# Patient Record
Sex: Female | Born: 1950
Health system: Southern US, Community
[De-identification: ages and names within clinical notes are randomized; demographics above are authoritative.]

## PROBLEM LIST (undated history)

## (undated) DIAGNOSIS — E079 Disorder of thyroid, unspecified: Secondary | ICD-10-CM

## (undated) DIAGNOSIS — G4730 Sleep apnea, unspecified: Secondary | ICD-10-CM

## (undated) DIAGNOSIS — H9190 Unspecified hearing loss, unspecified ear: Secondary | ICD-10-CM

## (undated) DIAGNOSIS — K635 Polyp of colon: Secondary | ICD-10-CM

## (undated) DIAGNOSIS — T7840XA Allergy, unspecified, initial encounter: Secondary | ICD-10-CM

## (undated) DIAGNOSIS — J449 Chronic obstructive pulmonary disease, unspecified: Secondary | ICD-10-CM

## (undated) DIAGNOSIS — J189 Pneumonia, unspecified organism: Secondary | ICD-10-CM

## (undated) DIAGNOSIS — R0602 Shortness of breath: Secondary | ICD-10-CM

## (undated) DIAGNOSIS — G709 Myoneural disorder, unspecified: Secondary | ICD-10-CM

## (undated) DIAGNOSIS — F329 Major depressive disorder, single episode, unspecified: Secondary | ICD-10-CM

## (undated) DIAGNOSIS — A630 Anogenital (venereal) warts: Secondary | ICD-10-CM

## (undated) DIAGNOSIS — M1990 Unspecified osteoarthritis, unspecified site: Secondary | ICD-10-CM

## (undated) DIAGNOSIS — M810 Age-related osteoporosis without current pathological fracture: Secondary | ICD-10-CM

## (undated) DIAGNOSIS — F419 Anxiety disorder, unspecified: Secondary | ICD-10-CM

## (undated) HISTORY — PX: COLONOSCOPY: SHX174

## (undated) HISTORY — DX: Anxiety disorder, unspecified: F41.9

## (undated) HISTORY — DX: Allergy, unspecified, initial encounter: T78.40XA

## (undated) HISTORY — DX: Age-related osteoporosis without current pathological fracture: M81.0

## (undated) HISTORY — PX: KNEE ARTHROSCOPY: SUR90

## (undated) HISTORY — PX: SPINE SURGERY: SHX786

## (undated) HISTORY — DX: Myoneural disorder, unspecified: G70.9

## (undated) HISTORY — DX: Anogenital (venereal) warts: A63.0

## (undated) HISTORY — DX: Unspecified hearing loss, unspecified ear: H91.90

## (undated) HISTORY — DX: Sleep apnea, unspecified: G47.30

## (undated) HISTORY — DX: Chronic obstructive pulmonary disease, unspecified: J44.9

## (undated) HISTORY — PX: EYE SURGERY: SHX253

## (undated) HISTORY — DX: Disorder of thyroid, unspecified: E07.9

## (undated) HISTORY — DX: Polyp of colon: K63.5

## (undated) HISTORY — PX: TONSILLECTOMY: SUR1361

## (undated) HISTORY — PX: TUBAL LIGATION: SHX77

## (undated) HISTORY — DX: Unspecified osteoarthritis, unspecified site: M19.90

## (undated) HISTORY — DX: Major depressive disorder, single episode, unspecified: F32.9

## (undated) SURGERY — Surgical Case
Anesthesia: *Unknown

---

## 2002-04-12 ENCOUNTER — Encounter: Payer: Self-pay | Admitting: Unknown Physician Specialty

## 2002-04-12 ENCOUNTER — Ambulatory Visit (HOSPITAL_COMMUNITY): Admission: RE | Admit: 2002-04-12 | Discharge: 2002-04-12 | Payer: Self-pay | Admitting: Unknown Physician Specialty

## 2010-05-25 ENCOUNTER — Ambulatory Visit (INDEPENDENT_AMBULATORY_CARE_PROVIDER_SITE_OTHER): Payer: 59 | Admitting: Family Medicine

## 2010-05-25 ENCOUNTER — Encounter: Payer: Self-pay | Admitting: Family Medicine

## 2010-05-25 DIAGNOSIS — Z0000 Encounter for general adult medical examination without abnormal findings: Secondary | ICD-10-CM

## 2010-05-25 DIAGNOSIS — Z780 Asymptomatic menopausal state: Secondary | ICD-10-CM

## 2010-05-25 DIAGNOSIS — M1710 Unilateral primary osteoarthritis, unspecified knee: Secondary | ICD-10-CM

## 2010-05-25 DIAGNOSIS — Z1231 Encounter for screening mammogram for malignant neoplasm of breast: Secondary | ICD-10-CM

## 2010-05-25 DIAGNOSIS — IMO0002 Reserved for concepts with insufficient information to code with codable children: Secondary | ICD-10-CM

## 2010-05-25 MED ORDER — MELOXICAM 15 MG PO TABS: 15.0000 mg | ORAL_TABLET | Freq: Every day | ORAL | Status: DC

## 2010-05-25 NOTE — Patient Instructions (Signed)
Arthritis - Degenerative, Osteoarthritis You have osteoarthritis. This is the wear and tear arthritis that comes with aging. It is also called degenerative arthritis. This is common in people past middle age. It is caused by stress on the joints from living. The large weight bearing joints of the lower extremities are most often affected. The knees, hips, back, neck, and hands can become painful, swollen, and stiff. This is the most common type of arthritis. It comes on with age, carrying too much weight, and from injury. Treatment includes resting the sore joint until the pain and swelling improve. Crutches or a walker may be needed for severe flares. Only take over-the-counter or prescription medicines for pain, discomfort, or fever as directed by your caregiver. Local heat therapy may improve motion. Cortisone shots into the joint are sometimes used to reduce pain and swelling during flares. Osteoarthritis is usually not crippling and progresses slowly. There are things you can do to decrease pain:  Avoid high impact activities.   Exercise regularly.   Low impact exercises such as walking, biking and swimming help to keep the muscles strong and keep normal joint function.   Stretching helps to keep your range of motion.   Lose weight if you are overweight. This reduces joint stress.  In severe cases when you have pain at rest or increasing disability, joint surgery may be helpful. See your caregiver for follow-up treatment as recommended.  SEEK IMMEDIATE MEDICAL CARE IF:  You have severe joint pain.   Marked swelling and redness in your joint develops.   You develop a high fever.  Document Released: 12/24/2004 Document Re-Released: 04/25/2007 [employer] Patient Information 2011 [employer]. 

## 2010-05-25 NOTE — Progress Notes (Signed)
  SUBJECTIVE:   Jasmine Mclaughlin is a 60 y.o. female who presents in consultation for osteoporosis and OA.  She was dx at 38 with arthritis in back and has progressively getting worse.   She is falling a lot and does not know if her leg is giving out or not--- its always the right leg.  It's been going on for 1 1/2 years.  She always falls forward and always on R knee.  She avoids steps because of knee pain.   OSTEOPOROSIS risk factors (non-modifiable) Personal Hx of fracture as an adult: no Hx of fracture in first-degree relative: no Caucasian race: yes Advanced age: no female sex: yes Dementia: no Poor health/frailty:no   OSTEOPOROSIS risk factors (potentially modifiable): Tobacco use: yes Low body weight (<127 lbs): no Estrogen deficiency    early menopause (age <45) or bilateral ovariectomy: no    prolonged premenopausal amenorrhea (>1 yr): no Low calcium intake (lifelong): no Alcoholism: no Recurrent falls: yes Inadequate physical activity: yes  Current calcium and Vit D intake: Dietary sources: milk supplements: ca and vita d     OBJECTIVE:    BP 132/76  Pulse 71  Temp(Src) 99 F (37.2 C) (Oral)  Ht 5\' 4"  (1.626 m)  Wt 204 lb 9.6 oz (92.806 kg)  BMI 35.12 kg/m2  SpO2 99% General appearance: alert, cooperative, appears stated age and no distress Back: symmetric, no curvature. ROM normal. No CVA tenderness. Lungs: clear to auscultation bilaterally Heart: regular rate and rhythm, S1, S2 normal, no murmur, click, rub or gallop Extremities: + swelling b/l knees  R>L,  no pain with palp,   Pulses: 2+ and symmetric Skin: Skin color, texture, turgor normal. No rashes or lesions Lymph nodes: Cervical, supraclavicular, and axillary nodes normal. Neurologic: Alert and oriented X 3, normal strength and tone. Normal symmetric reflexes. Normal coordination and gait  Results of prior DEXA:None ordered      ASSESSMENT:   osteoarthritis   Frequent falls   PLAN:  Refer to ortho  for OA  check BMD (Note: The National Osteoporosis Foundation recommends pharmacological treatment  of all postmenopausal women with T-scores below -2.0, and those with T-scores below -1.5  with risk factors for osteoporosis. Osteoporosis is present when the T-score is below -2.5. The T-score is defined as the number of standard deviations above or below the average BMD value for young, healthy white women.)  Labs:  See orders.  Is pharmacological therapy indicated? will wait for results Which medication is selected: none Which dosing regimen is selected: none  A. Contraindications to bisphosphonates: hypocalcemia, esophageal abnormalities, inability to remain upright for at least 30 min. post dose, renal insufficiency. Contraindications reviewed: not applicable.  B. Patients on bisphosphonates should also receive adequate calcium and vitamin D supplementation. Patient advised regarding calcium and vitamin D supplements: yes. Patient advised not to take these supplements at same time as bisphosphonate due to inhibition of absorption: not applicable.  C. Contraindications to SERM therapy: history of blood clots, may increase hot flashes and cause leg cramping.

## 2010-06-15 ENCOUNTER — Other Ambulatory Visit: Payer: 59

## 2010-06-15 ENCOUNTER — Ambulatory Visit: Payer: 59

## 2010-07-20 ENCOUNTER — Ambulatory Visit
Admission: RE | Admit: 2010-07-20 | Discharge: 2010-07-20 | Disposition: A | Discharge status: Home / Self Care | Payer: 59 | Source: Ambulatory Visit | Attending: Family Medicine | Admitting: Family Medicine

## 2010-07-20 DIAGNOSIS — Z1231 Encounter for screening mammogram for malignant neoplasm of breast: Secondary | ICD-10-CM

## 2010-07-20 DIAGNOSIS — Z780 Asymptomatic menopausal state: Secondary | ICD-10-CM

## 2010-07-25 ENCOUNTER — Telehealth: Payer: Self-pay

## 2010-07-25 NOTE — Telephone Encounter (Signed)
Message copied by Arnette Norris on Wed Jul 25, 2010  1:50 PM ------      Message from: Lelon Perla      Created: Sun Jul 22, 2010  8:21 PM       If pt is taking 1200-1500 mg calcium daily and 1000u vita D---- consider add fosamx 70 mg weekly for osteopenia      Recheck 2 years

## 2010-07-25 NOTE — Telephone Encounter (Signed)
Discussed with patient and she stated she was only taking 500 mg of calcium daily. She has agreed to increase and take 1000 mg of Vitamin D. Will call and discuss if she decides to start medications--- Copy of report mailed to patient     KP

## 2010-07-30 ENCOUNTER — Other Ambulatory Visit: Payer: Self-pay | Admitting: Family Medicine

## 2010-07-30 DIAGNOSIS — R928 Other abnormal and inconclusive findings on diagnostic imaging of breast: Secondary | ICD-10-CM

## 2010-08-01 ENCOUNTER — Encounter: Payer: Self-pay | Admitting: Family Medicine

## 2010-08-03 ENCOUNTER — Ambulatory Visit
Admission: RE | Admit: 2010-08-03 | Discharge: 2010-08-03 | Disposition: A | Discharge status: Home / Self Care | Payer: 59 | Source: Ambulatory Visit | Attending: Family Medicine | Admitting: Family Medicine

## 2010-08-03 DIAGNOSIS — R928 Other abnormal and inconclusive findings on diagnostic imaging of breast: Secondary | ICD-10-CM

## 2010-08-29 ENCOUNTER — Ambulatory Visit (INDEPENDENT_AMBULATORY_CARE_PROVIDER_SITE_OTHER): Payer: 59 | Admitting: Family Medicine

## 2010-08-29 ENCOUNTER — Other Ambulatory Visit (HOSPITAL_COMMUNITY)
Admission: RE | Admit: 2010-08-29 | Discharge: 2010-08-29 | Disposition: A | Discharge status: Home / Self Care | Payer: 59 | Source: Ambulatory Visit | Attending: Family Medicine | Admitting: Family Medicine

## 2010-08-29 ENCOUNTER — Encounter: Payer: Self-pay | Admitting: Internal Medicine

## 2010-08-29 ENCOUNTER — Encounter: Payer: Self-pay | Admitting: Family Medicine

## 2010-08-29 VITALS — BP 132/74 | HR 71 | Temp 98.9°F | Ht 63.25 in | Wt 206.4 lb

## 2010-08-29 DIAGNOSIS — Z23 Encounter for immunization: Secondary | ICD-10-CM

## 2010-08-29 DIAGNOSIS — Z01419 Encounter for gynecological examination (general) (routine) without abnormal findings: Secondary | ICD-10-CM | POA: Insufficient documentation

## 2010-08-29 DIAGNOSIS — K635 Polyp of colon: Secondary | ICD-10-CM

## 2010-08-29 DIAGNOSIS — M1710 Unilateral primary osteoarthritis, unspecified knee: Secondary | ICD-10-CM

## 2010-08-29 DIAGNOSIS — D126 Benign neoplasm of colon, unspecified: Secondary | ICD-10-CM

## 2010-08-29 DIAGNOSIS — M179 Osteoarthritis of knee, unspecified: Secondary | ICD-10-CM

## 2010-08-29 DIAGNOSIS — K625 Hemorrhage of anus and rectum: Secondary | ICD-10-CM

## 2010-08-29 DIAGNOSIS — IMO0002 Reserved for concepts with insufficient information to code with codable children: Secondary | ICD-10-CM

## 2010-08-29 DIAGNOSIS — Z0000 Encounter for general adult medical examination without abnormal findings: Secondary | ICD-10-CM

## 2010-08-29 LAB — LIPID PANEL
HDL: 47.5 mg/dL (ref >39.00)
LDL Cholesterol: 94 mg/dL (ref 0–99)
Total CHOL/HDL Ratio: 4
Triglycerides: 126 mg/dL (ref 0.0–149.0)
VLDL: 25.2 mg/dL (ref 0.0–40.0)

## 2010-08-29 LAB — POCT URINALYSIS DIPSTICK
Bilirubin, UA: NEGATIVE
Blood, UA: NEGATIVE
Leukocytes, UA: NEGATIVE
Nitrite, UA: NEGATIVE
Protein, UA: NEGATIVE
Urobilinogen, UA: 0.2
pH, UA: 5

## 2010-08-29 LAB — CBC WITH DIFFERENTIAL/PLATELET
Basophils Absolute: 0.1 10*3/uL (ref 0.0–0.1)
Basophils Relative: 0.5 % (ref 0.0–3.0)
Eosinophils Absolute: 0.2 10*3/uL (ref 0.0–0.7)
Lymphocytes Relative: 31 % (ref 12.0–46.0)
MCHC: 33.1 g/dL (ref 30.0–36.0)
MCV: 95.5 fl (ref 78.0–100.0)
Monocytes Absolute: 0.7 10*3/uL (ref 0.1–1.0)
Neutrophils Relative %: 59.6 % (ref 43.0–77.0)
Platelets: 323 10*3/uL (ref 150.0–400.0)
RBC: 4.34 Mil/uL (ref 3.87–5.11)

## 2010-08-29 LAB — HEPATIC FUNCTION PANEL
Alkaline Phosphatase: 84 U/L (ref 39–117)
Bilirubin, Direct: 0.1 mg/dL (ref 0.0–0.3)
Total Bilirubin: 0.4 mg/dL (ref 0.3–1.2)

## 2010-08-29 LAB — BASIC METABOLIC PANEL
BUN: 21 mg/dL (ref 6–23)
CO2: 26 mEq/L (ref 19–32)
Calcium: 10.2 mg/dL (ref 8.4–10.5)
Chloride: 107 mEq/L (ref 96–112)
Creatinine, Ser: 0.7 mg/dL (ref 0.4–1.2)

## 2010-08-29 MED ORDER — NAPROXEN 500 MG PO TABS: 500.0000 mg | ORAL_TABLET | Freq: Two times a day (BID) | ORAL | Status: DC

## 2010-08-29 NOTE — Patient Instructions (Signed)

## 2010-08-29 NOTE — Progress Notes (Signed)
Addended by: Arnette Norris on: 08/29/2010 10:31 AM   Modules accepted: Orders

## 2010-08-29 NOTE — Progress Notes (Signed)
Subjective:     Jasmine Mclaughlin is a 60 y.o. female and is here for a comprehensive physical exam. The patient reports problems - + blood per rectum.  + hx colon polyps and constipation.  History   Social History  . Marital Status: Married    Spouse Name: N/A    Number of Children: N/A  . Years of Education: N/A   Occupational History  . [employer]     sits in front of computer   Social History Main Topics  . Smoking status: Current Everyday Smoker -- 0.5 packs/day for 45 years    Types: Cigarettes  . Smokeless tobacco: Never Used   Comment: she is thinking about it   . Alcohol Use: Yes  . Drug Use: No  . Sexually Active: Yes -- Female partner(s)   Other Topics Concern  . Not on file   Social History Narrative  . No narrative on file   Health Maintenance  Topic Date Due  . Pap Smear  06/04/1969  . Tetanus/tdap  06/05/1970  . Influenza Vaccine  10/08/2010  . Mammogram  08/02/2012  . Colonoscopy  08/29/2015    The following portions of the patient's history were reviewed and updated as appropriate: allergies, current medications, past family history, past medical history, past social history, past surgical history and problem list.  Review of Systems Review of Systems  Constitutional: Negative for activity change, appetite change and fatigue.  HENT: Negative for hearing loss, congestion, tinnitus and ear discharge.  + dentures Eyes: Negative for visual disturbance (see optho q1y -- vision corrected to 20/20 with glasses).  Respiratory: Negative for cough, chest tightness and shortness of breath.   Cardiovascular: Negative for chest pain, palpitations and leg swelling.  Gastrointestinal: Negative for abdominal pain, diarrhea, constipation and abdominal distention.  Genitourinary: Negative for urgency, frequency, decreased urine volume and difficulty urinating.  Musculoskeletal: Negative for back pain, arthralgias and gait problem.  Skin: Negative for color change, pallor and rash.    Neurological: Negative for dizziness, light-headedness, numbness and headaches.  Hematological: Negative for adenopathy. Does not bruise/bleed easily.  Psychiatric/Behavioral: Negative for suicidal ideas, confusion, sleep disturbance, self-injury, dysphoric mood, decreased concentration and agitation.       Objective:    BP 132/74  Pulse 71  Temp(Src) 98.9 F (37.2 C) (Oral)  Ht 5' 3.25" (1.607 m)  Wt 206 lb 6.4 oz (93.622 kg)  BMI 36.27 kg/m2  SpO2 97% General appearance: alert, cooperative, appears stated age and no distress Head: Normocephalic, without obvious abnormality, atraumatic Eyes: conjunctivae/corneas clear. PERRL, EOM's intact. Fundi benign. Ears: normal TM's and external ear canals both ears Nose: Nares normal. Septum midline. Mucosa normal. No drainage or sinus tenderness. Throat: lips, mucosa, and tongue normal; teeth and gums normal Neck: no adenopathy, no carotid bruit, no JVD, supple, symmetrical, trachea midline and thyroid not enlarged, symmetric, no tenderness/mass/nodules Back: symmetric, no curvature. ROM normal. No CVA tenderness. Lungs: clear to auscultation bilaterally Breasts: normal appearance, no masses or tenderness Heart: regular rate and rhythm, S1, S2 normal, no murmur, click, rub or gallop Abdomen: soft, non-tender; bowel sounds normal; no masses,  no organomegaly Pelvic: cervix normal in appearance, external genitalia normal, no adnexal masses or tenderness, no cervical motion tenderness, rectovaginal septum normal, uterus normal size, shape, and consistency and vagina normal without discharge  Heme neg brown stool Extremities: extremities normal, atraumatic, no cyanosis or edema Pulses: 2+ and symmetric Skin: Skin color, texture, turgor normal. No rashes or lesions Lymph nodes: Cervical,  supraclavicular, and axillary nodes normal. Neurologic: Alert and oriented X 3, normal strength and tone. Normal symmetric reflexes. Normal coordination and  gait psych--no depression / anxiety    Assessment:  1  Healthy female exam. -- 2 rectal bleed 3  Hx polyps 4 OA-- per ortho   Plan:  Refer to GI Check fasting labs dtap done today  ghm utd See After Visit Summary for Counseling Recommendations

## 2010-09-19 ENCOUNTER — Ambulatory Visit: Payer: 59 | Admitting: Internal Medicine

## 2010-09-28 ENCOUNTER — Encounter: Payer: Self-pay | Admitting: Internal Medicine

## 2010-09-28 ENCOUNTER — Ambulatory Visit (INDEPENDENT_AMBULATORY_CARE_PROVIDER_SITE_OTHER): Payer: 59 | Admitting: Internal Medicine

## 2010-09-28 DIAGNOSIS — Z720 Tobacco use: Secondary | ICD-10-CM | POA: Insufficient documentation

## 2010-09-28 DIAGNOSIS — M1990 Unspecified osteoarthritis, unspecified site: Secondary | ICD-10-CM | POA: Insufficient documentation

## 2010-09-28 DIAGNOSIS — F17200 Nicotine dependence, unspecified, uncomplicated: Secondary | ICD-10-CM

## 2010-09-28 DIAGNOSIS — Z86010 Personal history of colonic polyps: Secondary | ICD-10-CM

## 2010-09-28 DIAGNOSIS — K625 Hemorrhage of anus and rectum: Secondary | ICD-10-CM

## 2010-09-28 HISTORY — DX: Tobacco use: Z72.0

## 2010-09-28 HISTORY — DX: Unspecified osteoarthritis, unspecified site: M19.90

## 2010-09-28 MED ORDER — PEG-KCL-NACL-NASULF-NA ASC-C 100 G PO SOLR: 1.0000 | ORAL | Status: DC

## 2010-09-28 NOTE — Patient Instructions (Addendum)
You have been given a separate informational sheet regarding your tobacco use, the importance of quitting and local resources to help you quit. You have been scheduled for a Colonoscopy, instructions have been provided. You Prep solution has been sent to your pharmacy. You may use Colace as needed.

## 2010-09-28 NOTE — Progress Notes (Signed)
Subjective:    Patient ID: Jasmine Mclaughlin, female    DOB: 13-Jul-1950, 60 y.o.   MRN: [id number]  HPI Jasmine Mclaughlin is a 59 year old female with a past medical history of colonic polyps and arthritis who seen in consultation at the request of Dr. Laury Axon for evaluation of rectal bleeding history of colon polyps.  The patient reports she is intermittently seeing bright red blood with bowel movements.  She reports this is the same symptoms that she was having back in 2006 prior to her polypectomy. Her bleeding now is erratic in may happen one to 2 days and then disappear for a week or 2. She reports it is always bright red and present on the stool and on the tissue. She normally sees this with her a.m. bowel movement and rarely with other bowel movements in a given day. She's had no abdominal pain. She occasionally has constipation which she relates to her narcotic pain medication. She is using Colace when necessary. She denies diarrhea. She denies change in her stool caliber. She reports her appetite has been good and her weight is stable. No nausea or vomiting. No heartburn. No trouble swallowing or painful swallowing. No fevers chills or night sweats.  The patient's fascia at her first colonoscopy in 2006 in which she had several polyps removed, one of which was large and felt to be the source of her hematochezia. She reports this colon polyp was removed and she had a subsequent colonoscopy 6 months later to ensure complete resection. She was told to have a colonoscopy in 2 years. She reports this did not happen due to difficulty with her insurance and being laid off from her job.  Review of Systems Constitutional: Negative for fever, chills, night sweats, activity change, appetite change and unexpected weight change HEENT: Negative for sore throat, mouth sores and trouble swallowing. Eyes: Negative for visual disturbance Respiratory: Negative for cough, chest tightness and shortness of breath Cardiovascular:  Negative for chest pain, palpitations and lower extremity swelling Gastrointestinal: See history of present illness Genitourinary: Negative for dysuria and hematuria. Musculoskeletal: Negative for back pain, + chronic arthritis, no myalgias Skin: Negative for rash or color change Neurological: Negative for headaches, weakness, numbness Hematological: Negative for adenopathy, negative for easy bruising/bleeding Psychiatric/behavioral: Negative for depressed mood, negative for anxiety  Past Medical History  Diagnosis Date  . Arthritis   . Colon polyps - 2cm sigmoid TVA 2006   . Genital warts   . Asthma    Current Outpatient Prescriptions  Medication Sig Dispense Refill  . Calcium Carbonate ([facility] CALCIUM) 1500 MG TABS Take 1 tablet by mouth daily.        . cholecalciferol (VITAMIN D) 1000 UNITS tablet Take 1,000 Units by mouth daily.        . Doxylamine Succinate, Sleep, (SLEEP AID PO) Take 1 tablet by mouth daily.        Marland Kitchen HYDROcodone-acetaminophen (NORCO) 5-325 MG per tablet Take 1 tablet by mouth daily.       . Misc Natural Products (OSTEO BI-FLEX ADV JOINT SHIELD PO) Take 2 tablets by mouth daily.        . naproxen (NAPROSYN) 500 MG tablet Take 1 tablet (500 mg total) by mouth 2 (two) times daily.  60 tablet  2  . peg 3350 powder (MOVIPREP) 100 G SOLR Take 1 kit (100 g total) by mouth as directed. See written handout  1 kit  0   No Known Allergies  Family History  Problem Relation  Age of Onset  . Arthritis Mother   . Arthritis Father   . Stroke Father   . Colon cancer Father 14  . Breast cancer Maternal Grandmother     Social History  . Marital Status: Single    Number of Children: 1   Occupational History  . [employer]     sits in front of computer   Social History Main Topics  . Smoking status: Current Everyday Smoker -- 0.5 packs/day for 45 years    Types: Cigarettes  . Smokeless tobacco: Never Used   Comment: she is thinking about it   . Alcohol Use: No  . Drug Use:  No  . Sexually Active: Yes -- Female partner(s)   Social History Narrative   1 caffeine drinks daily       Objective:   Physical Exam BP 128/74  Pulse 60  Ht 5\' 4"  (1.626 m)  Wt 207 lb (93.895 kg)  BMI 35.53 kg/m2 Constitutional: Well-developed and well-nourished. No distress. HEENT: Normocephalic and atraumatic. Oropharynx is clear and moist. No oropharyngeal exudate. Conjunctivae are normal. Pupils are equal round and reactive to light. No scleral icterus. Neck: Neck supple. Trachea midline. Cardiovascular: Normal rate, regular rhythm and intact distal pulses. No M/R/G Pulmonary/chest: Effort normal and breath sounds normal. Mild end expiratory wheeze, no rales or rhonchi. Abdominal: Soft, obese, nontender, nondistended. Bowel sounds active throughout. There are no masses palpable. No hepatosplenomegaly. Lymphadenopathy: No cervical adenopathy noted. Neurological: Alert and oriented to person place and time. Skin: Skin is warm and dry. No rashes noted. Psychiatric: Normal mood and affect. Behavior is normal.  CBC    Component Value Date/Time   WBC 10.2 08/29/2010 1027   RBC 4.34 08/29/2010 1027   HGB 13.8 08/29/2010 1027   HCT 41.5 08/29/2010 1027   PLT 323.0 08/29/2010 1027   MCV 95.5 08/29/2010 1027   MCHC 33.1 08/29/2010 1027   RDW 14.6 08/29/2010 1027   LYMPHSABS 3.2 08/29/2010 1027   MONOABS 0.7 08/29/2010 1027   EOSABS 0.2 08/29/2010 1027   BASOSABS 0.1 08/29/2010 1027   BMET    Component Value Date/Time   NA 141 08/29/2010 1027   K 4.3 08/29/2010 1027   CL 107 08/29/2010 1027   CO2 26 08/29/2010 1027   GLUCOSE 96 08/29/2010 1027   BUN 21 08/29/2010 1027   CREATININE 0.7 08/29/2010 1027   CALCIUM 10.2 08/29/2010 1027   Previous Procedures: Colonoscopy 11/22/2004: 20 mm peduncular polyp in the sigmoid. Resected and retrieved. Status post Hemoclip placement due to using at the polypectomy site. One 2 mm and cecum and another 6 mm at splenic flexure polyp.resected Internal  hemorrhoids Diverticulosis  --Pathology: Cecal polyp = tubular adenoma; splenic flexure polyp = benign colon polyp; sigmoid polyp = tubulovillous adenoma   Colonoscopy 03/21/2005: Nonthrombosed external hemorrhoids, diverticulosis, internal hemorrhoids, normal terminal ileum, 2 mm sigmoid polyp  --Pathology: Sigmoid colon biopsy = none needed fragments of benign colonic mucosa with ulceration and inflammatory exudate (minute fragments of benign appearing colonic mucosa displaying hyperplastic epithelial changes)  --Recommendation after this procedure was to repeat 5 years per note by Dr. Bosie Clos     Assessment & Plan:  60 year old female with a past medical history of colon polyp with high risk features (tubulovillous adenoma) in 2006, and chronic arthritis who presents with intermittent rectal bleeding.  1.  Rectal bleeding -- certainly the patient's intermittent rectal bleeding could be due to her history of internal and external hemorrhoids. However, given her history of polyps  it is very reasonable to repeat the colonoscopy at this time. This will be scheduled for her in the coming weeks with propofol given her daily chronic narcotic pain medication use.  We discussed procedure and time was provided for questions and answers. She is willing to proceed. She will bring a driver.  2. Smoking -- we have recommended the patient stop smoking and offered resources to help when she is ready.

## 2010-10-25 ENCOUNTER — Ambulatory Visit (AMBULATORY_SURGERY_CENTER): Payer: 59 | Admitting: Internal Medicine

## 2010-10-25 ENCOUNTER — Encounter: Payer: Self-pay | Admitting: Internal Medicine

## 2010-10-25 VITALS — BP 121/74 | HR 67 | Temp 97.1°F | Resp 24 | Ht 64.0 in | Wt 207.0 lb

## 2010-10-25 DIAGNOSIS — D126 Benign neoplasm of colon, unspecified: Secondary | ICD-10-CM

## 2010-10-25 DIAGNOSIS — K625 Hemorrhage of anus and rectum: Secondary | ICD-10-CM

## 2010-10-25 MED ORDER — SODIUM CHLORIDE 0.9 % IV SOLN: 500.0000 mL | INTRAVENOUS | Status: DC

## 2010-10-25 NOTE — Patient Instructions (Signed)
FOLLOW DISCHARGE INSTRUCTIONS (BLUE & GREEN SHEETS).   INFORMATION ON POLYPS, DIVERTICULOSIS, HEMORRHOIDS, & HIGH FIBER DIET GIVEN TO YOU   YOU WILL RECEIVE LETTER FROM DR PYRTLE ONCE HE RECEIVES PATHOLOGY BACK ON POLYPS REMOVED

## 2010-10-26 ENCOUNTER — Telehealth: Payer: Self-pay

## 2010-10-26 NOTE — Telephone Encounter (Signed)

## 2010-11-01 ENCOUNTER — Encounter: Payer: Self-pay | Admitting: Internal Medicine

## 2011-06-26 ENCOUNTER — Other Ambulatory Visit: Payer: Self-pay | Admitting: Family Medicine

## 2011-06-26 DIAGNOSIS — Z1231 Encounter for screening mammogram for malignant neoplasm of breast: Secondary | ICD-10-CM

## 2011-07-26 ENCOUNTER — Ambulatory Visit
Admission: RE | Admit: 2011-07-26 | Discharge: 2011-07-26 | Disposition: A | Discharge status: Home / Self Care | Payer: 59 | Source: Ambulatory Visit | Attending: Family Medicine | Admitting: Family Medicine

## 2011-07-26 DIAGNOSIS — Z1231 Encounter for screening mammogram for malignant neoplasm of breast: Secondary | ICD-10-CM

## 2011-08-30 ENCOUNTER — Encounter: Payer: Self-pay | Admitting: Family Medicine

## 2011-08-30 ENCOUNTER — Ambulatory Visit (INDEPENDENT_AMBULATORY_CARE_PROVIDER_SITE_OTHER): Payer: 59 | Admitting: Family Medicine

## 2011-08-30 VITALS — BP 120/70 | HR 50 | Temp 98.7°F | Ht 63.0 in | Wt 200.0 lb

## 2011-08-30 DIAGNOSIS — Z720 Tobacco use: Secondary | ICD-10-CM

## 2011-08-30 DIAGNOSIS — M129 Arthropathy, unspecified: Secondary | ICD-10-CM

## 2011-08-30 DIAGNOSIS — M1990 Unspecified osteoarthritis, unspecified site: Secondary | ICD-10-CM

## 2011-08-30 DIAGNOSIS — F17200 Nicotine dependence, unspecified, uncomplicated: Secondary | ICD-10-CM

## 2011-08-30 DIAGNOSIS — M545 Low back pain, unspecified: Secondary | ICD-10-CM

## 2011-08-30 DIAGNOSIS — Z0000 Encounter for general adult medical examination without abnormal findings: Secondary | ICD-10-CM

## 2011-08-30 LAB — HEPATIC FUNCTION PANEL
ALT: 14 U/L (ref 0–35)
AST: 14 U/L (ref 0–37)
Alkaline Phosphatase: 80 U/L (ref 39–117)
Bilirubin, Direct: 0 mg/dL (ref 0.0–0.3)
Total Protein: 7.1 g/dL (ref 6.0–8.3)

## 2011-08-30 LAB — CBC WITH DIFFERENTIAL/PLATELET
Basophils Relative: 1 % (ref 0.0–3.0)
Eosinophils Relative: 3.4 % (ref 0.0–5.0)
Lymphocytes Relative: 38.6 % (ref 12.0–46.0)
MCV: 95.5 fl (ref 78.0–100.0)
Monocytes Absolute: 0.6 10*3/uL (ref 0.1–1.0)
Monocytes Relative: 7.7 % (ref 3.0–12.0)
Neutrophils Relative %: 49.3 % (ref 43.0–77.0)
RBC: 4.2 Mil/uL (ref 3.87–5.11)
WBC: 7.5 10*3/uL (ref 4.5–10.5)

## 2011-08-30 LAB — POCT URINALYSIS DIPSTICK
Bilirubin, UA: NEGATIVE
Glucose, UA: NEGATIVE
Leukocytes, UA: NEGATIVE
Nitrite, UA: NEGATIVE
Urobilinogen, UA: 0.2

## 2011-08-30 LAB — BASIC METABOLIC PANEL
Chloride: 109 mEq/L (ref 96–112)
Creatinine, Ser: 0.8 mg/dL (ref 0.4–1.2)

## 2011-08-30 LAB — LIPID PANEL: Total CHOL/HDL Ratio: 4

## 2011-08-30 MED ORDER — CELECOXIB 200 MG PO CAPS: ORAL_CAPSULE | ORAL | Status: DC

## 2011-08-30 MED ORDER — CALCIUM CARBONATE 1500 (600 CA) MG PO TABS: ORAL_TABLET | ORAL | Status: DC

## 2011-08-30 NOTE — Progress Notes (Signed)
Subjective:     Jasmine Mclaughlin is a 61 y.o. female and is here for a comprehensive physical exam. The patient reports pain in R hip with numbness and tingling in R foot.  History   Social History  . Marital Status: Married    Spouse Name: N/A    Number of Children: 1  . Years of Education: N/A   Occupational History  . [employer]     sits in front of computer   Social History Main Topics  . Smoking status: Current Everyday Smoker -- 0.3 packs/day for 45 years    Types: Cigarettes  . Smokeless tobacco: Never Used   Comment: she is thinking about it   . Alcohol Use: No  . Drug Use: No  . Sexually Active: Yes -- Female partner(s)   Other Topics Concern  . Not on file   Social History Narrative   1 caffeine drinks daily    Health Maintenance  Topic Date Due  . Zostavax  06/05/2011  . Influenza Vaccine  10/08/2011  . Mammogram  07/25/2013  . Pap Smear  08/28/2013  . Colonoscopy  10/24/2013  . Tetanus/tdap  08/28/2020    The following portions of the patient's history were reviewed and updated as appropriate: allergies, current medications, past family history, past medical history, past social history, past surgical history and problem list.  Review of Systems Review of Systems  Constitutional: Negative for activity change, appetite change and fatigue.  HENT: Negative for hearing loss, congestion, tinnitus and ear discharge.  dentist---false teeth Eyes: Negative for visual disturbance (see optho q1y -- vision corrected to 20/20 with glasses).  Respiratory: Negative for cough, chest tightness and shortness of breath.   Cardiovascular: Negative for chest pain, palpitations and leg swelling.  Gastrointestinal: Negative for abdominal pain, diarrhea, constipation and abdominal distention.  Genitourinary: Negative for urgency, frequency, decreased urine volume and difficulty urinating.  Musculoskeletal: + back pain and arthralgias Skin: Negative for color change, pallor and rash.    Neurological: Negative for dizziness, light-headedness, numbness and headaches.  Hematological: Negative for adenopathy. Does not bruise/bleed easily.  Psychiatric/Behavioral: Negative for suicidal ideas, confusion, sleep disturbance, self-injury, dysphoric mood, decreased concentration and agitation.       Objective:    BP 120/70  Pulse 50  Temp 98.7 F (37.1 C) (Oral)  Ht 5\' 3"  (1.6 m)  Wt 200 lb (90.719 kg)  BMI 35.43 kg/m2  SpO2 97% General appearance: alert, cooperative, appears stated age and no distress Head: Normocephalic, without obvious abnormality, atraumatic Eyes: conjunctivae/corneas clear. PERRL, EOM's intact. Fundi benign. Ears: normal TM's and external ear canals both ears Nose: Nares normal. Septum midline. Mucosa normal. No drainage or sinus tenderness. Throat: lips, mucosa, and tongue normal; teeth and gums normal Neck: no adenopathy, no carotid bruit, no JVD, supple, symmetrical, trachea midline and thyroid not enlarged, symmetric, no tenderness/mass/nodules Back: symmetric, no curvature. ROM normal. No CVA tenderness. Lungs: clear to auscultation bilaterally Breasts: normal appearance, no masses or tenderness Heart: S1, S2 normal Abdomen: soft, non-tender; bowel sounds normal; no masses,  no organomegaly Pelvic: deferred Extremities: extremities normal, atraumatic, no cyanosis or edema Pulses: 2+ and symmetric Skin: Skin color, texture, turgor normal. No rashes or lesions Lymph nodes: Cervical, supraclavicular, and axillary nodes normal. Neurologic: Alert and oriented X 3, normal strength and tone. Normal symmetric reflexes. Normal coordination and gait psych-- no depression, anxiety    Assessment:    Healthy female exam.     Plan:    ghm utd  Check labs See After Visit Summary for Counseling Recommendations

## 2011-08-30 NOTE — Patient Instructions (Addendum)
Preventive Care for Adults, Female A healthy lifestyle and preventive care can promote health and wellness. Preventive health guidelines for women include the following key practices.  A routine yearly physical is a good way to check with your caregiver about your health and preventive screening. It is a chance to share any concerns and updates on your health, and to receive a thorough exam.   Visit your dentist for a routine exam and preventive care every 6 months. Brush your teeth twice a day and floss once a day. Good oral hygiene prevents tooth decay and gum disease.   The frequency of eye exams is based on your age, health, family medical history, use of contact lenses, and other factors. Follow your caregiver's recommendations for frequency of eye exams.   Eat a healthy diet. Foods like vegetables, fruits, whole grains, low-fat dairy products, and lean protein foods contain the nutrients you need without too many calories. Decrease your intake of foods high in solid fats, added sugars, and salt. Eat the right amount of calories for you.Get information about a proper diet from your caregiver, if necessary.   Regular physical exercise is one of the most important things you can do for your health. Most adults should get at least 150 minutes of moderate-intensity exercise (any activity that increases your heart rate and causes you to sweat) each week. In addition, most adults need muscle-strengthening exercises on 2 or more days a week.   Maintain a healthy weight. The body mass index (BMI) is a screening tool to identify possible weight problems. It provides an estimate of body fat based on height and weight. Your caregiver can help determine your BMI, and can help you achieve or maintain a healthy weight.For adults 20 years and older:   A BMI below 18.5 is considered underweight.   A BMI of 18.5 to 24.9 is normal.   A BMI of 25 to 29.9 is considered overweight.   A BMI of 30 and above is  considered obese.   Maintain normal blood lipids and cholesterol levels by exercising and minimizing your intake of saturated fat. Eat a balanced diet with plenty of fruit and vegetables. Blood tests for lipids and cholesterol should begin at age 20 and be repeated every 5 years. If your lipid or cholesterol levels are high, you are over 50, or you are at high risk for heart disease, you may need your cholesterol levels checked more frequently.Ongoing high lipid and cholesterol levels should be treated with medicines if diet and exercise are not effective.   If you smoke, find out from your caregiver how to quit. If you do not use tobacco, do not start.   If you are pregnant, do not drink alcohol. If you are breastfeeding, be very cautious about drinking alcohol. If you are not pregnant and choose to drink alcohol, do not exceed 1 drink per day. One drink is considered to be 12 ounces (355 mL) of beer, 5 ounces (148 mL) of wine, or 1.5 ounces (44 mL) of liquor.   Avoid use of street drugs. Do not share needles with anyone. Ask for help if you need support or instructions about stopping the use of drugs.   High blood pressure causes heart disease and increases the risk of stroke. Your blood pressure should be checked at least every 1 to 2 years. Ongoing high blood pressure should be treated with medicines if weight loss and exercise are not effective.   If you are 55 to 60   years old, ask your caregiver if you should take aspirin to prevent strokes.   Diabetes screening involves taking a blood sample to check your fasting blood sugar level. This should be done once every 3 years, after age 45, if you are within normal weight and without risk factors for diabetes. Testing should be considered at a younger age or be carried out more frequently if you are overweight and have at least 1 risk factor for diabetes.   Breast cancer screening is essential preventive care for women. You should practice "breast  self-awareness." This means understanding the normal appearance and feel of your breasts and may include breast self-examination. Any changes detected, no matter how small, should be reported to a caregiver. Women in their 20s and 30s should have a clinical breast exam (CBE) by a caregiver as part of a regular health exam every 1 to 3 years. After age 40, women should have a CBE every year. Starting at age 40, women should consider having a mammography (breast X-ray test) every year. Women who have a family history of breast cancer should talk to their caregiver about genetic screening. Women at a high risk of breast cancer should talk to their caregivers about having magnetic resonance imaging (MRI) and a mammography every year.   The Pap test is a screening test for cervical cancer. A Pap test can show cell changes on the cervix that might become cervical cancer if left untreated. A Pap test is a procedure in which cells are obtained and examined from the lower end of the uterus (cervix).   Women should have a Pap test starting at age 21.   Between ages 21 and 29, Pap tests should be repeated every 2 years.   Beginning at age 30, you should have a Pap test every 3 years as long as the past 3 Pap tests have been normal.   Some women have medical problems that increase the chance of getting cervical cancer. Talk to your caregiver about these problems. It is especially important to talk to your caregiver if a new problem develops soon after your last Pap test. In these cases, your caregiver may recommend more frequent screening and Pap tests.   The above recommendations are the same for women who have or have not gotten the vaccine for human papillomavirus (HPV).   If you had a hysterectomy for a problem that was not cancer or a condition that could lead to cancer, then you no longer need Pap tests. Even if you no longer need a Pap test, a regular exam is a good idea to make sure no other problems are  starting.   If you are between ages 65 and 70, and you have had normal Pap tests going back 10 years, you no longer need Pap tests. Even if you no longer need a Pap test, a regular exam is a good idea to make sure no other problems are starting.   If you have had past treatment for cervical cancer or a condition that could lead to cancer, you need Pap tests and screening for cancer for at least 20 years after your treatment.   If Pap tests have been discontinued, risk factors (such as a new sexual partner) need to be reassessed to determine if screening should be resumed.   The HPV test is an additional test that may be used for cervical cancer screening. The HPV test looks for the virus that can cause the cell changes on the cervix.   The cells collected during the Pap test can be tested for HPV. The HPV test could be used to screen women aged 30 years and older, and should be used in women of any age who have unclear Pap test results. After the age of 30, women should have HPV testing at the same frequency as a Pap test.   Colorectal cancer can be detected and often prevented. Most routine colorectal cancer screening begins at the age of 50 and continues through age 75. However, your caregiver may recommend screening at an earlier age if you have risk factors for colon cancer. On a yearly basis, your caregiver may provide home test kits to check for hidden blood in the stool. Use of a small camera at the end of a tube, to directly examine the colon (sigmoidoscopy or colonoscopy), can detect the earliest forms of colorectal cancer. Talk to your caregiver about this at age 50, when routine screening begins. Direct examination of the colon should be repeated every 5 to 10 years through age 75, unless early forms of pre-cancerous polyps or small growths are found.   Hepatitis C blood testing is recommended for all people born from 1945 through 1965 and any individual with known risks for hepatitis C.    Practice safe sex. Use condoms and avoid high-risk sexual practices to reduce the spread of sexually transmitted infections (STIs). STIs include gonorrhea, chlamydia, syphilis, trichomonas, herpes, HPV, and human immunodeficiency virus (HIV). Herpes, HIV, and HPV are viral illnesses that have no cure. They can result in disability, cancer, and death. Sexually active women aged 25 and younger should be checked for chlamydia. Older women with new or multiple partners should also be tested for chlamydia. Testing for other STIs is recommended if you are sexually active and at increased risk.   Osteoporosis is a disease in which the bones lose minerals and strength with aging. This can result in serious bone fractures. The risk of osteoporosis can be identified using a bone density scan. Women ages 65 and over and women at risk for fractures or osteoporosis should discuss screening with their caregivers. Ask your caregiver whether you should take a calcium supplement or vitamin D to reduce the rate of osteoporosis.   Menopause can be associated with physical symptoms and risks. Hormone replacement therapy is available to decrease symptoms and risks. You should talk to your caregiver about whether hormone replacement therapy is right for you.   Use sunscreen with sun protection factor (SPF) of 30 or more. Apply sunscreen liberally and repeatedly throughout the day. You should seek shade when your shadow is shorter than you. Protect yourself by wearing long sleeves, pants, a wide-brimmed hat, and sunglasses year round, whenever you are outdoors.   Once a month, do a whole body skin exam, using a mirror to look at the skin on your back. Notify your caregiver of new moles, moles that have irregular borders, moles that are larger than a pencil eraser, or moles that have changed in shape or color.   Stay current with required immunizations.   Influenza. You need a dose every fall (or winter). The composition of  the flu vaccine changes each year, so being vaccinated once is not enough.   Pneumococcal polysaccharide. You need 1 to 2 doses if you smoke cigarettes or if you have certain chronic medical conditions. You need 1 dose at age 65 (or older) if you have never been vaccinated.   Tetanus, diphtheria, pertussis (Tdap, Td). Get 1 dose of   Tdap vaccine if you are younger than age 65, are over 65 and have contact with an infant, are a healthcare worker, are pregnant, or simply want to be protected from whooping cough. After that, you need a Td booster dose every 10 years. Consult your caregiver if you have not had at least 3 tetanus and diphtheria-containing shots sometime in your life or have a deep or dirty wound.   HPV. You need this vaccine if you are a woman age 26 or younger. The vaccine is given in 3 doses over 6 months.   Measles, mumps, rubella (MMR). You need at least 1 dose of MMR if you were born in 1957 or later. You may also need a second dose.   Meningococcal. If you are age 19 to 21 and a first-year college student living in a residence hall, or have one of several medical conditions, you need to get vaccinated against meningococcal disease. You may also need additional booster doses.   Zoster (shingles). If you are age 60 or older, you should get this vaccine.   Varicella (chickenpox). If you have never had chickenpox or you were vaccinated but received only 1 dose, talk to your caregiver to find out if you need this vaccine.   Hepatitis A. You need this vaccine if you have a specific risk factor for hepatitis A virus infection or you simply wish to be protected from this disease. The vaccine is usually given as 2 doses, 6 to 18 months apart.   Hepatitis B. You need this vaccine if you have a specific risk factor for hepatitis B virus infection or you simply wish to be protected from this disease. The vaccine is given in 3 doses, usually over 6 months.  Preventive Services /  Frequency Ages 19 to 39  Blood pressure check.** / Every 1 to 2 years.   Lipid and cholesterol check.** / Every 5 years beginning at age 20.   Clinical breast exam.** / Every 3 years for women in their 20s and 30s.   Pap test.** / Every 2 years from ages 21 through 29. Every 3 years starting at age 30 through age 65 or 70 with a history of 3 consecutive normal Pap tests.   HPV screening.** / Every 3 years from ages 30 through ages 65 to 70 with a history of 3 consecutive normal Pap tests.   Hepatitis C blood test.** / For any individual with known risks for hepatitis C.   Skin self-exam. / Monthly.   Influenza immunization.** / Every year.   Pneumococcal polysaccharide immunization.** / 1 to 2 doses if you smoke cigarettes or if you have certain chronic medical conditions.   Tetanus, diphtheria, pertussis (Tdap, Td) immunization. / A one-time dose of Tdap vaccine. After that, you need a Td booster dose every 10 years.   HPV immunization. / 3 doses over 6 months, if you are 26 and younger.   Measles, mumps, rubella (MMR) immunization. / You need at least 1 dose of MMR if you were born in 1957 or later. You may also need a second dose.   Meningococcal immunization. / 1 dose if you are age 19 to 21 and a first-year college student living in a residence hall, or have one of several medical conditions, you need to get vaccinated against meningococcal disease. You may also need additional booster doses.   Varicella immunization.** / Consult your caregiver.   Hepatitis A immunization.** / Consult your caregiver. 2 doses, 6 to 18 months   apart.   Hepatitis B immunization.** / Consult your caregiver. 3 doses usually over 6 months.  Ages 40 to 64  Blood pressure check.** / Every 1 to 2 years.   Lipid and cholesterol check.** / Every 5 years beginning at age 20.   Clinical breast exam.** / Every year after age 40.   Mammogram.** / Every year beginning at age 40 and continuing for as  long as you are in good health. Consult with your caregiver.   Pap test.** / Every 3 years starting at age 30 through age 65 or 70 with a history of 3 consecutive normal Pap tests.   HPV screening.** / Every 3 years from ages 30 through ages 65 to 70 with a history of 3 consecutive normal Pap tests.   Fecal occult blood test (FOBT) of stool. / Every year beginning at age 50 and continuing until age 75. You may not need to do this test if you get a colonoscopy every 10 years.   Flexible sigmoidoscopy or colonoscopy.** / Every 5 years for a flexible sigmoidoscopy or every 10 years for a colonoscopy beginning at age 50 and continuing until age 75.   Hepatitis C blood test.** / For all people born from 1945 through 1965 and any individual with known risks for hepatitis C.   Skin self-exam. / Monthly.   Influenza immunization.** / Every year.   Pneumococcal polysaccharide immunization.** / 1 to 2 doses if you smoke cigarettes or if you have certain chronic medical conditions.   Tetanus, diphtheria, pertussis (Tdap, Td) immunization.** / A one-time dose of Tdap vaccine. After that, you need a Td booster dose every 10 years.   Measles, mumps, rubella (MMR) immunization. / You need at least 1 dose of MMR if you were born in 1957 or later. You may also need a second dose.   Varicella immunization.** / Consult your caregiver.   Meningococcal immunization.** / Consult your caregiver.   Hepatitis A immunization.** / Consult your caregiver. 2 doses, 6 to 18 months apart.   Hepatitis B immunization.** / Consult your caregiver. 3 doses, usually over 6 months.  Ages 65 and over  Blood pressure check.** / Every 1 to 2 years.   Lipid and cholesterol check.** / Every 5 years beginning at age 20.   Clinical breast exam.** / Every year after age 40.   Mammogram.** / Every year beginning at age 40 and continuing for as long as you are in good health. Consult with your caregiver.   Pap test.** /  Every 3 years starting at age 30 through age 65 or 70 with a 3 consecutive normal Pap tests. Testing can be stopped between 65 and 70 with 3 consecutive normal Pap tests and no abnormal Pap or HPV tests in the past 10 years.   HPV screening.** / Every 3 years from ages 30 through ages 65 or 70 with a history of 3 consecutive normal Pap tests. Testing can be stopped between 65 and 70 with 3 consecutive normal Pap tests and no abnormal Pap or HPV tests in the past 10 years.   Fecal occult blood test (FOBT) of stool. / Every year beginning at age 50 and continuing until age 75. You may not need to do this test if you get a colonoscopy every 10 years.   Flexible sigmoidoscopy or colonoscopy.** / Every 5 years for a flexible sigmoidoscopy or every 10 years for a colonoscopy beginning at age 50 and continuing until age 75.   Hepatitis   C blood test.** / For all people born from 1945 through 1965 and any individual with known risks for hepatitis C.   Osteoporosis screening.** / A one-time screening for women ages 65 and over and women at risk for fractures or osteoporosis.   Skin self-exam. / Monthly.   Influenza immunization.** / Every year.   Pneumococcal polysaccharide immunization.** / 1 dose at age 65 (or older) if you have never been vaccinated.   Tetanus, diphtheria, pertussis (Tdap, Td) immunization. / A one-time dose of Tdap vaccine if you are over 65 and have contact with an infant, are a healthcare worker, or simply want to be protected from whooping cough. After that, you need a Td booster dose every 10 years.   Varicella immunization.** / Consult your caregiver.   Meningococcal immunization.** / Consult your caregiver.   Hepatitis A immunization.** / Consult your caregiver. 2 doses, 6 to 18 months apart.   Hepatitis B immunization.** / Check with your caregiver. 3 doses, usually over 6 months.  ** Family history and personal history of risk and conditions may change your caregiver's  recommendations. Document Released: 02/19/2001 Document Revised: 12/13/2010 Document Reviewed: 05/21/2010 [employer] Patient Information 2012 [employer]. 

## 2011-08-30 NOTE — Assessment & Plan Note (Signed)
Sample nicorette gum given to pt

## 2011-08-30 NOTE — Assessment & Plan Note (Signed)
celebrex 200 mg qd prn

## 2011-09-02 LAB — VARICELLA ZOSTER ANTIBODY, IGG: Varicella IgG: 1.34 {ISR} — ABNORMAL HIGH

## 2011-09-06 ENCOUNTER — Other Ambulatory Visit: Payer: Self-pay | Admitting: Family Medicine

## 2011-09-06 ENCOUNTER — Ambulatory Visit
Admission: RE | Admit: 2011-09-06 | Discharge: 2011-09-06 | Disposition: A | Discharge status: Home / Self Care | Payer: 59 | Source: Ambulatory Visit | Attending: Family Medicine | Admitting: Family Medicine

## 2011-09-06 DIAGNOSIS — M545 Low back pain, unspecified: Secondary | ICD-10-CM

## 2011-09-06 DIAGNOSIS — M549 Dorsalgia, unspecified: Secondary | ICD-10-CM

## 2011-09-10 ENCOUNTER — Telehealth: Payer: Self-pay

## 2011-09-10 DIAGNOSIS — M549 Dorsalgia, unspecified: Secondary | ICD-10-CM

## 2011-09-10 NOTE — Telephone Encounter (Signed)
Discussed with patient and she voice understansing.    KP

## 2011-09-10 NOTE — Telephone Encounter (Signed)
Message copied by Arnette Norris on Tue Sep 10, 2011  5:26 PM ------      Message from: Lelon Perla      Created: Fri Sep 06, 2011  5:22 PM       Abnormality on xray-- unsure of what it is --- needs MRI

## 2011-09-13 ENCOUNTER — Telehealth: Payer: Self-pay | Admitting: Family Medicine

## 2011-09-13 NOTE — Addendum Note (Signed)
Addended by: Lelon Perla on: 09/13/2011 05:40 PM   Modules accepted: Orders

## 2011-09-13 NOTE — Telephone Encounter (Signed)
Inform pt -- her insurance refused MRI--- we will refer to ortho to have them evaluate pt and xrays

## 2011-09-13 NOTE — Telephone Encounter (Signed)
In reference to Order for MRI LS without, [employer] did not approve [id number] by phone, or even after I faxed clinicals and xrays.  For chance of approval, peer to peer will be required.  Please advise.

## 2011-09-16 NOTE — Telephone Encounter (Signed)
Dr. Laury Axon has entered referral to send patient to Orthopaedic.  Will process referral & inform patient.

## 2011-09-16 NOTE — Telephone Encounter (Signed)
I spoke with patient and she has already spoken with [facility]

## 2012-01-10 ENCOUNTER — Ambulatory Visit (INDEPENDENT_AMBULATORY_CARE_PROVIDER_SITE_OTHER): Payer: 59 | Admitting: Family Medicine

## 2012-01-10 ENCOUNTER — Encounter: Payer: Self-pay | Admitting: Family Medicine

## 2012-01-10 VITALS — BP 128/80 | HR 83 | Temp 98.9°F | Ht 63.5 in | Wt 200.4 lb

## 2012-01-10 DIAGNOSIS — J329 Chronic sinusitis, unspecified: Secondary | ICD-10-CM

## 2012-01-10 MED ORDER — AMOXICILLIN 875 MG PO TABS: 875.0000 mg | ORAL_TABLET | Freq: Two times a day (BID) | ORAL | Status: DC

## 2012-01-10 NOTE — Patient Instructions (Addendum)
This is an early sinus infection Start the Amoxicillin twice daily- take w/ food Add Mucinex DM for cough and congestion REST! Drink plenty of fluids Hang in there!!!

## 2012-01-10 NOTE — Progress Notes (Signed)
  Subjective:    Patient ID: Jasmine Mclaughlin, female    DOB: [birth year], 62 y.o.   MRN: [id number]  HPI URI- sxs started 2 days ago.  Yesterday stayed home from work and slept all day.  sxs started w/ facial pressure, bilateral ear pain, sore throat.  Today having chest tightness.  No fever.  + fatigue.  Cough just started- mostly dry.  + HA.  No N/V/D.  Mild body aches.  No known sick contacts.   Review of Systems For ROS see HPI     Objective:   Physical Exam  Vitals reviewed. Constitutional: She appears well-developed and well-nourished. No distress.  HENT:  Head: Normocephalic and atraumatic.  Right Ear: Tympanic membrane normal.  Left Ear: Tympanic membrane normal.  Nose: Mucosal edema and rhinorrhea present. Right sinus exhibits maxillary sinus tenderness. Right sinus exhibits no frontal sinus tenderness. Left sinus exhibits maxillary sinus tenderness. Left sinus exhibits no frontal sinus tenderness.  Mouth/Throat: Uvula is midline and mucous membranes are normal. Posterior oropharyngeal erythema present. No oropharyngeal exudate.  Eyes: Conjunctivae normal and EOM are normal. Pupils are equal, round, and reactive to light.  Neck: Normal range of motion. Neck supple.  Cardiovascular: Normal rate, regular rhythm and normal heart sounds.   Pulmonary/Chest: Effort normal and breath sounds normal. No respiratory distress. She has no wheezes.  Lymphadenopathy:    She has no cervical adenopathy.          Assessment & Plan:

## 2012-02-22 ENCOUNTER — Other Ambulatory Visit: Payer: Self-pay

## 2012-04-13 ENCOUNTER — Other Ambulatory Visit: Payer: Self-pay | Admitting: Orthopedic Surgery

## 2012-04-23 ENCOUNTER — Encounter (HOSPITAL_COMMUNITY): Payer: Self-pay

## 2012-04-28 ENCOUNTER — Encounter (HOSPITAL_COMMUNITY): Payer: Self-pay

## 2012-04-28 ENCOUNTER — Encounter (HOSPITAL_COMMUNITY)
Admission: RE | Admit: 2012-04-28 | Discharge: 2012-04-28 | Disposition: A | Discharge status: Home / Self Care | Payer: 59 | Source: Ambulatory Visit | Attending: Orthopedic Surgery | Admitting: Orthopedic Surgery

## 2012-04-28 ENCOUNTER — Other Ambulatory Visit: Payer: Self-pay | Admitting: Orthopedic Surgery

## 2012-04-28 DIAGNOSIS — J45909 Unspecified asthma, uncomplicated: Secondary | ICD-10-CM | POA: Insufficient documentation

## 2012-04-28 DIAGNOSIS — Z01810 Encounter for preprocedural cardiovascular examination: Secondary | ICD-10-CM | POA: Insufficient documentation

## 2012-04-28 DIAGNOSIS — F17200 Nicotine dependence, unspecified, uncomplicated: Secondary | ICD-10-CM | POA: Insufficient documentation

## 2012-04-28 DIAGNOSIS — Z01812 Encounter for preprocedural laboratory examination: Secondary | ICD-10-CM | POA: Insufficient documentation

## 2012-04-28 DIAGNOSIS — Z01818 Encounter for other preprocedural examination: Secondary | ICD-10-CM | POA: Insufficient documentation

## 2012-04-28 HISTORY — DX: Shortness of breath: R06.02

## 2012-04-28 LAB — URINALYSIS, ROUTINE W REFLEX MICROSCOPIC
Hgb urine dipstick: NEGATIVE
Nitrite: NEGATIVE
Specific Gravity, Urine: 1.008 (ref 1.005–1.030)
Urobilinogen, UA: 0.2 mg/dL (ref 0.0–1.0)
pH: 6 (ref 5.0–8.0)

## 2012-04-28 LAB — CBC WITH DIFFERENTIAL/PLATELET
Eosinophils Absolute: 0.3 10*3/uL (ref 0.0–0.7)
Hemoglobin: 14.7 g/dL (ref 12.0–15.0)
Lymphocytes Relative: 33 % (ref 12–46)
Lymphs Abs: 3.2 10*3/uL (ref 0.7–4.0)
Monocytes Relative: 5 % (ref 3–12)
Neutro Abs: 5.6 10*3/uL (ref 1.7–7.7)
Neutrophils Relative %: 58 % (ref 43–77)
Platelets: 347 10*3/uL (ref 150–400)
RBC: 4.64 MIL/uL (ref 3.87–5.11)
WBC: 9.8 10*3/uL (ref 4.0–10.5)

## 2012-04-28 LAB — COMPREHENSIVE METABOLIC PANEL
ALT: 13 U/L (ref 0–35)
Alkaline Phosphatase: 109 U/L (ref 39–117)
Chloride: 103 mEq/L (ref 96–112)
GFR calc Af Amer: 81 mL/min — ABNORMAL LOW (ref >90)
Glucose, Bld: 92 mg/dL (ref 70–99)
Potassium: 4.1 mEq/L (ref 3.5–5.1)
Sodium: 141 mEq/L (ref 135–145)
Total Bilirubin: 0.3 mg/dL (ref 0.3–1.2)
Total Protein: 7.7 g/dL (ref 6.0–8.3)

## 2012-04-28 LAB — SURGICAL PCR SCREEN
MRSA, PCR: NEGATIVE
Staphylococcus aureus: POSITIVE — AB

## 2012-04-28 LAB — TYPE AND SCREEN: Antibody Screen: NEGATIVE

## 2012-04-28 LAB — APTT: aPTT: 45 seconds — ABNORMAL HIGH (ref 24–37)

## 2012-04-28 IMAGING — CR DG CHEST 2V
2 series · 2 of 2 positions shown · non-contrast
Comparison: None

CLINICAL DATA: Preoperative assessment for low back surgery,
history smoking, asthma

CHEST - 2 VIEW

[view not recorded (1 of 2)]
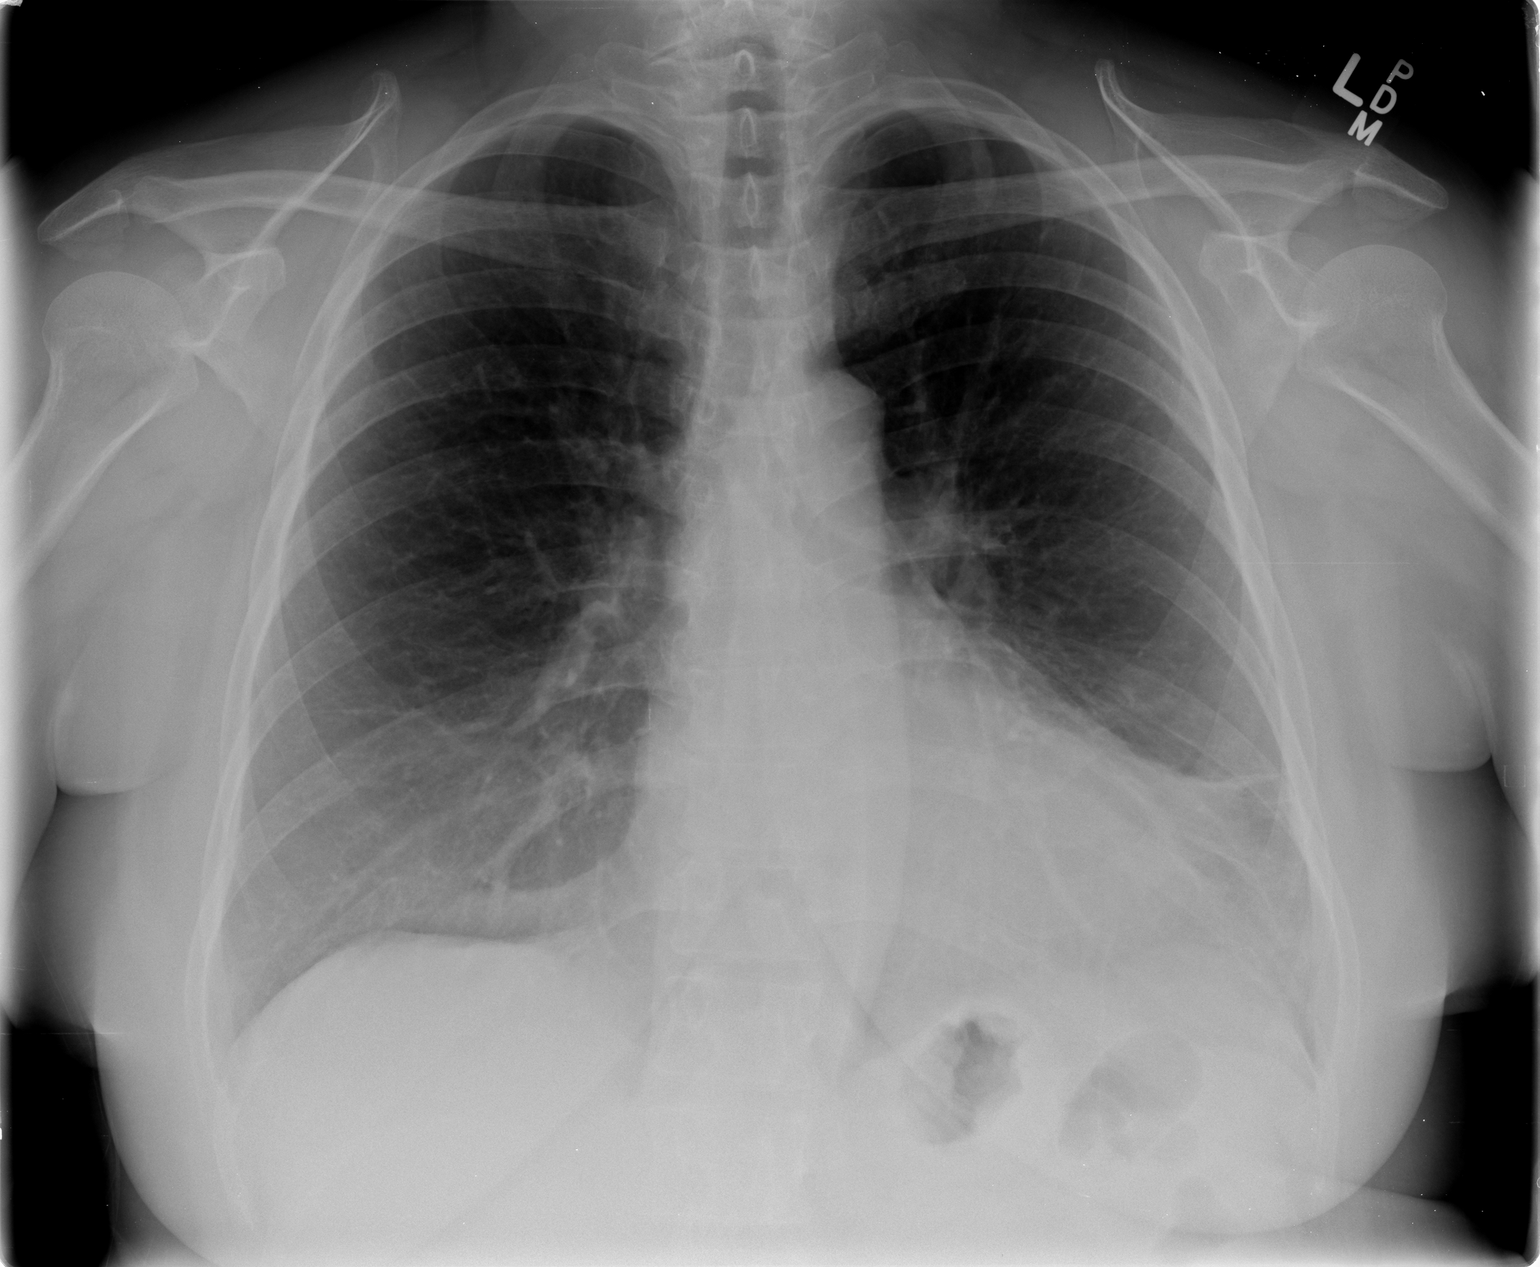

[view not recorded (2 of 2)]
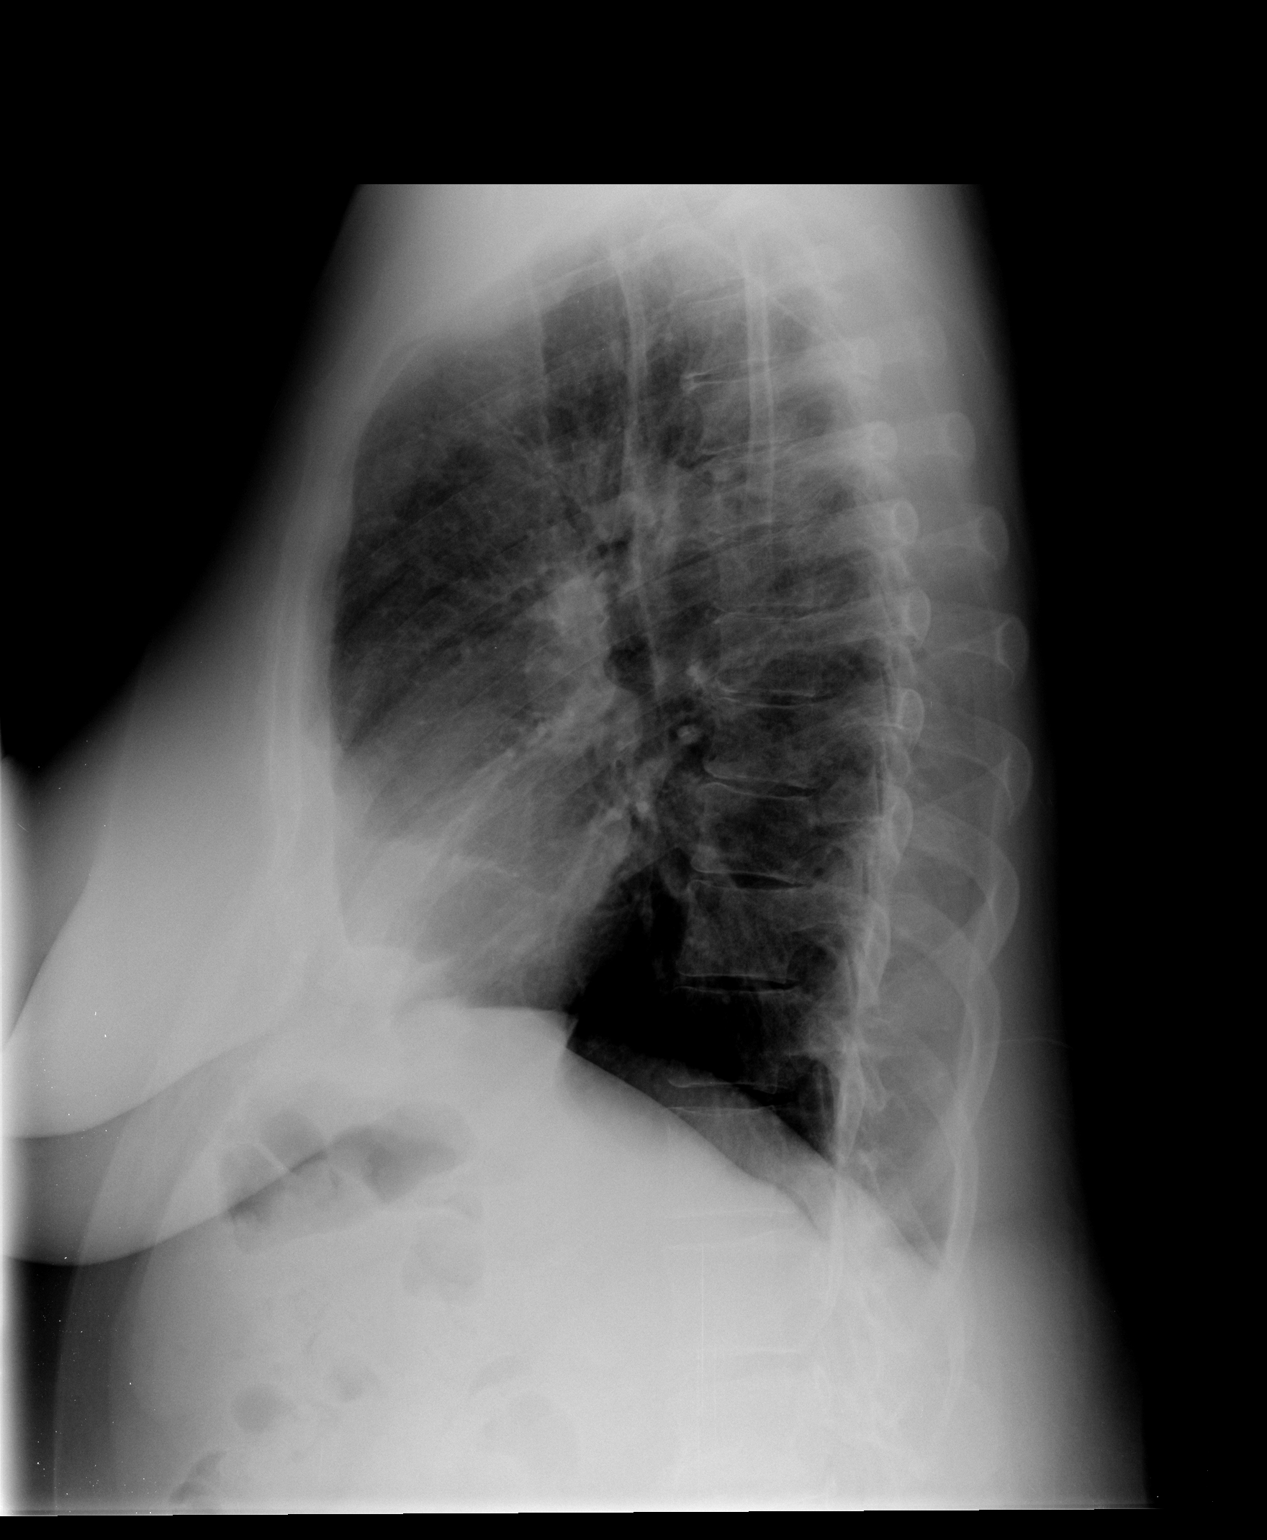

[2 of 2 positions shown; findings below may reference images not displayed]

FINDINGS: Upper-normal size of cardiac silhouette.
Mediastinal contours and pulmonary vascularity normal.
Subsegmental atelectasis versus scarring at lingula.
Lungs otherwise clear.
No pleural effusion or pneumothorax.
No acute osseous findings.
IMPRESSION: Minimal subsegmental atelectasis versus scarring at lingula.

## 2012-04-28 NOTE — Progress Notes (Signed)
Call to [facility] at Dr. Marshell Levan office, requested order for surgery on the 05/06/2012.

## 2012-04-28 NOTE — Pre-Procedure Instructions (Signed)
Jasmine Mclaughlin  04/28/2012   Your procedure is scheduled on:  05/06/2012  Report to [facility] at 6:30 AM.  Call this number if you have problems the morning of surgery: [phone]   Remember:   Do not eat food or drink liquids after midnight. Tuesday   Take these medicines the morning of surgery with A SIP OF WATER: Vicodin & Flexeril are OK a.m. Of surgery if needed    Do not wear jewelry, make-up or nail polish.  Do not wear lotions, powders, or perfumes. You may wear deodorant.  Do not shave 48 hours prior to surgery.   Do not bring valuables to the hospital.  Contacts, dentures or bridgework may not be worn into surgery.  Leave suitcase in the car. After surgery it may be brought to your room.  For patients admitted to the hospital, checkout time is 11:00 AM the day of  discharge.   Patients discharged the day of surgery will not be allowed to drive  home.  Name and phone number of your driver: family  Special Instructions: Shower using CHG 2 nights before surgery and the night before surgery.  If you shower the day of surgery use CHG.  Use special wash - you have one bottle of CHG for all showers.  You should use approximately 1/3 of the bottle for each shower.   Please read over the following fact sheets that you were given: Pain Booklet, Coughing and Deep Breathing, Blood Transfusion Information, MRSA Information and Surgical Site Infection Prevention

## 2012-04-28 NOTE — Progress Notes (Signed)
Sees Dr. Laury Axon, had EKG 2 yrs. - as a PE baseline

## 2012-05-05 MED ORDER — POVIDONE-IODINE 7.5 % EX SOLN
Freq: Once | CUTANEOUS | Status: DC
  Filled 2012-05-05: qty 118

## 2012-05-05 MED ORDER — CEFAZOLIN SODIUM-DEXTROSE 2-3 GM-% IV SOLR
2.0000 g | INTRAVENOUS | Status: DC
  Filled 2012-05-05: qty 50

## 2012-05-05 MED ORDER — CEFAZOLIN SODIUM-DEXTROSE 2-3 GM-% IV SOLR
2.0000 g | INTRAVENOUS | Status: AC
  Administered 2012-05-06 (×2): 2 g via INTRAVENOUS

## 2012-05-06 ENCOUNTER — Encounter (HOSPITAL_COMMUNITY): Payer: Self-pay | Admitting: *Deleted

## 2012-05-06 ENCOUNTER — Ambulatory Visit (HOSPITAL_COMMUNITY): Payer: 59 | Admitting: Certified Registered"

## 2012-05-06 ENCOUNTER — Encounter (HOSPITAL_COMMUNITY)
Admission: RE | Disposition: A | Discharge status: Home / Self Care | Payer: Self-pay | Source: Ambulatory Visit | Attending: Orthopedic Surgery

## 2012-05-06 ENCOUNTER — Ambulatory Visit (HOSPITAL_COMMUNITY): Payer: 59

## 2012-05-06 ENCOUNTER — Inpatient Hospital Stay (HOSPITAL_COMMUNITY)
Admission: RE | Admit: 2012-05-06 | Discharge: 2012-05-11 | DRG: 455 | Disposition: A | Discharge status: Home / Self Care | Payer: 59 | Source: Ambulatory Visit | Attending: Orthopedic Surgery | Admitting: Orthopedic Surgery

## 2012-05-06 ENCOUNTER — Encounter (HOSPITAL_COMMUNITY): Payer: Self-pay | Admitting: Certified Registered"

## 2012-05-06 DIAGNOSIS — F17200 Nicotine dependence, unspecified, uncomplicated: Secondary | ICD-10-CM | POA: Diagnosis present

## 2012-05-06 DIAGNOSIS — M51379 Other intervertebral disc degeneration, lumbosacral region without mention of lumbar back pain or lower extremity pain: Principal | ICD-10-CM | POA: Diagnosis present

## 2012-05-06 DIAGNOSIS — J45909 Unspecified asthma, uncomplicated: Secondary | ICD-10-CM | POA: Diagnosis present

## 2012-05-06 DIAGNOSIS — B079 Viral wart, unspecified: Secondary | ICD-10-CM | POA: Diagnosis present

## 2012-05-06 DIAGNOSIS — M5137 Other intervertebral disc degeneration, lumbosacral region: Principal | ICD-10-CM | POA: Diagnosis present

## 2012-05-06 DIAGNOSIS — M79609 Pain in unspecified limb: Secondary | ICD-10-CM | POA: Diagnosis present

## 2012-05-06 HISTORY — PX: ANTERIOR FUSION LUMBAR SPINE: SUR629

## 2012-05-06 HISTORY — PX: ANTERIOR LAT LUMBAR FUSION: SHX1168

## 2012-05-06 IMAGING — RF DG LUMBAR SPINE 2-3V
1 series · 4 of 4 positions shown · non-contrast
Comparison: MRI lumbar spine [DATE].

CLINICAL DATA: Lumbar discectomy.

LUMBAR SPINE - 2-3 VIEW,DG C-ARM GT 120 MIN

[Series 1: run · 4 of 4 slices shown]
[im 1/4]
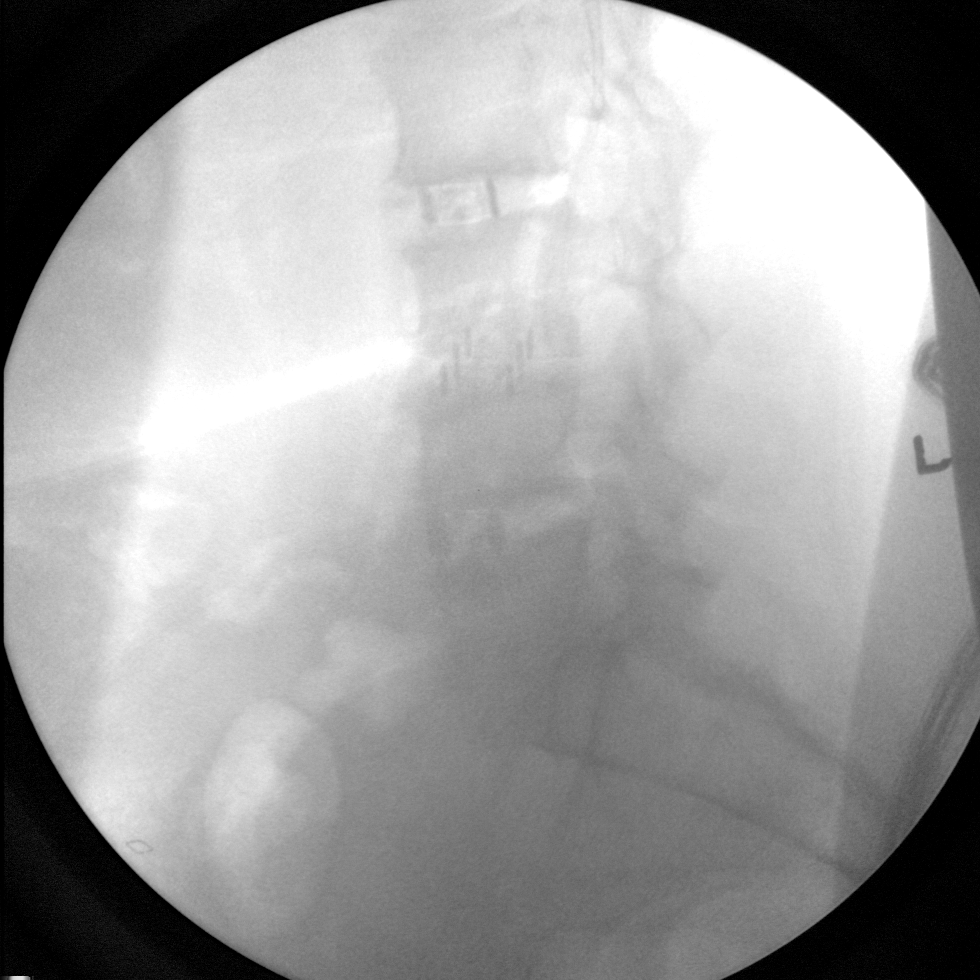
[im 2/4]
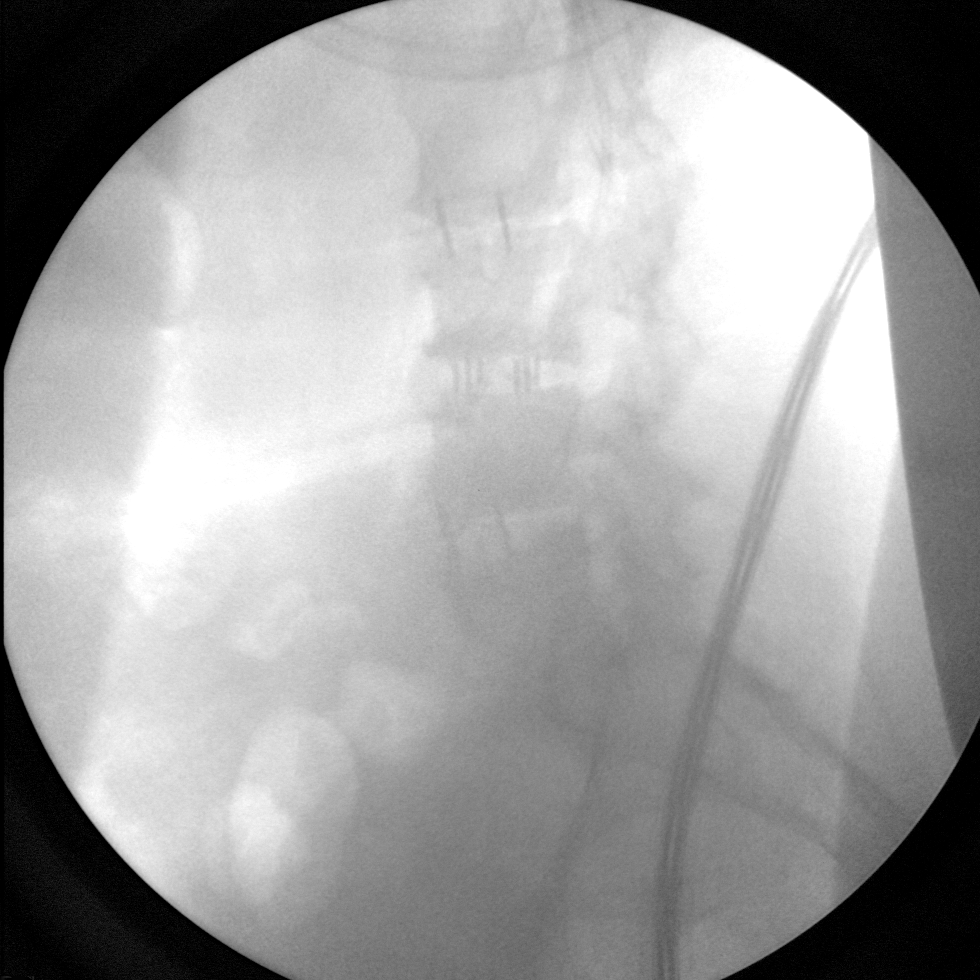
[im 3/4]
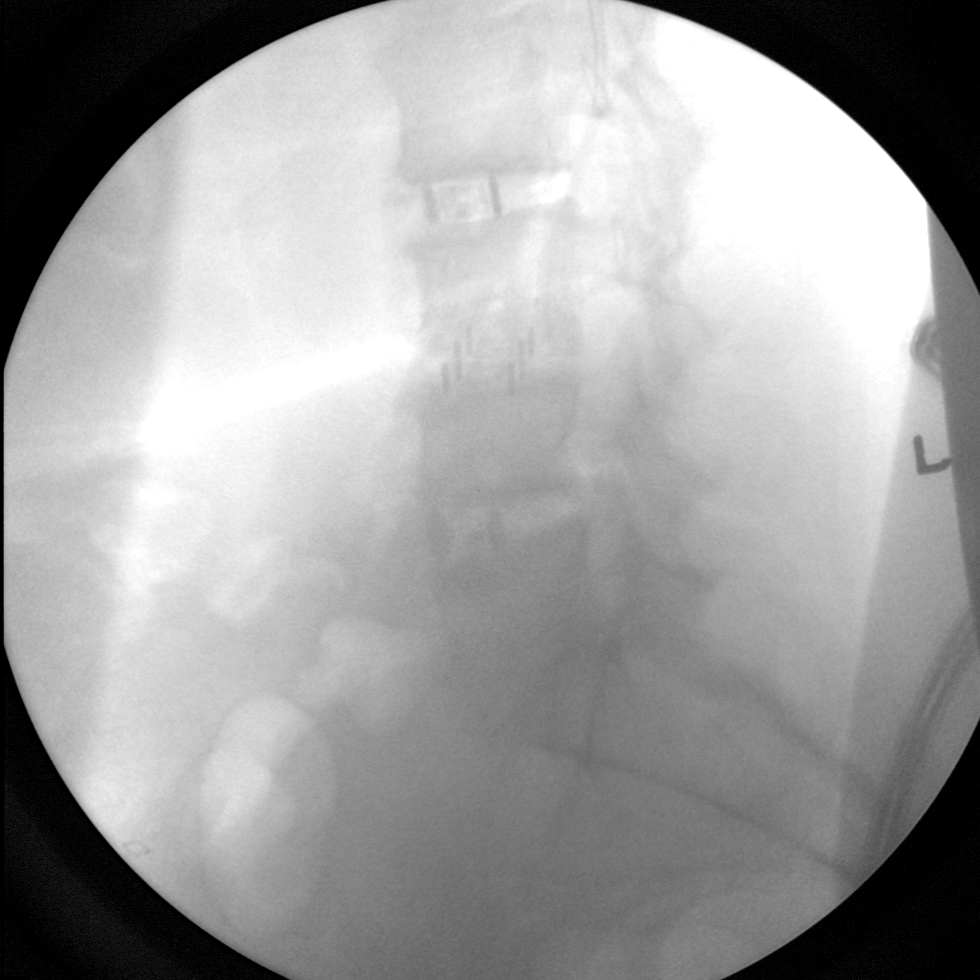
[im 4/4]
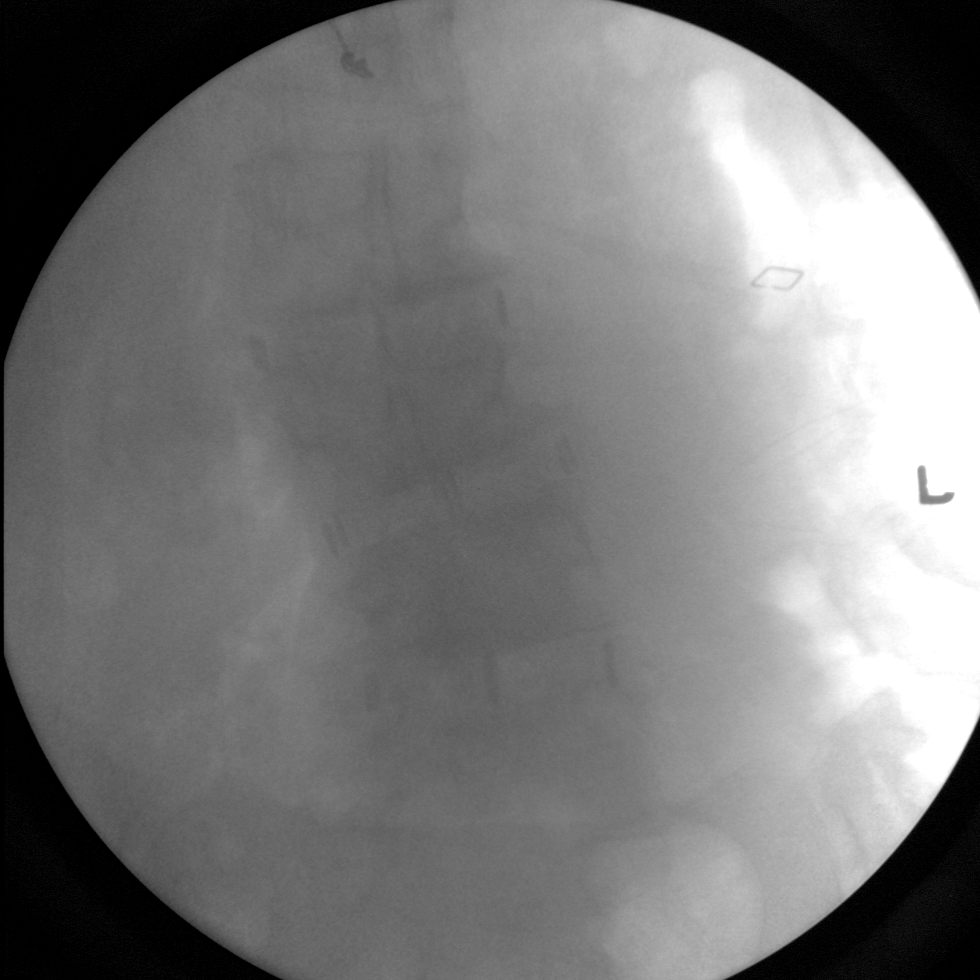

[4 of 4 positions shown; findings below may reference images not displayed]

FINDINGS: We are provided with four intraoperative fluoroscopic
spot views of the lumbar spine.  Images demonstrate interbody
spacers from L2-L5.  Vertebral body height is maintained.  Convex
right scoliosis is noted.
IMPRESSION: Anterior discectomy L2-L5

## 2012-05-06 SURGERY — ANTERIOR LATERAL LUMBAR FUSION 3 LEVELS
Anesthesia: General | Site: Spine Lumbar | Wound class: Clean

## 2012-05-06 MED ORDER — SODIUM CHLORIDE 0.9 % IJ SOLN: 3.0000 mL | Freq: Two times a day (BID) | INTRAMUSCULAR | Status: DC

## 2012-05-06 MED ORDER — DIPHENHYDRAMINE HCL 12.5 MG/5ML PO ELIX: 12.5000 mg | ORAL_SOLUTION | Freq: Four times a day (QID) | ORAL | Status: DC | PRN

## 2012-05-06 MED ORDER — SUCCINYLCHOLINE CHLORIDE 20 MG/ML IJ SOLN
INTRAMUSCULAR | Status: DC | PRN
  Administered 2012-05-06: 100 mg via INTRAVENOUS

## 2012-05-06 MED ORDER — SODIUM CHLORIDE 0.9 % IJ SOLN: 3.0000 mL | INTRAMUSCULAR | Status: DC | PRN

## 2012-05-06 MED ORDER — NALOXONE HCL 0.4 MG/ML IJ SOLN
0.4000 mg | INTRAMUSCULAR | Status: DC | PRN
Start: 2012-05-06 — End: 2012-05-07

## 2012-05-06 MED ORDER — MORPHINE SULFATE (PF) 1 MG/ML IV SOLN
INTRAVENOUS | Status: AC
  Administered 2012-05-06: 1.5 mg via INTRAVENOUS
  Filled 2012-05-06: qty 25

## 2012-05-06 MED ORDER — FENTANYL CITRATE 0.05 MG/ML IJ SOLN
INTRAMUSCULAR | Status: DC | PRN
  Administered 2012-05-06: 150 ug via INTRAVENOUS
  Administered 2012-05-06 (×7): 50 ug via INTRAVENOUS

## 2012-05-06 MED ORDER — CEFAZOLIN SODIUM 1-5 GM-% IV SOLN
1.0000 g | Freq: Three times a day (TID) | INTRAVENOUS | Status: AC
  Administered 2012-05-06 – 2012-05-07 (×2): 1 g via INTRAVENOUS
  Filled 2012-05-06 (×2): qty 50

## 2012-05-06 MED ORDER — MORPHINE SULFATE (PF) 1 MG/ML IV SOLN
INTRAVENOUS | Status: DC
  Administered 2012-05-06: 16:00:00 via INTRAVENOUS
  Administered 2012-05-06: 13.5 mg via INTRAVENOUS
  Administered 2012-05-06 – 2012-05-07 (×2): via INTRAVENOUS
  Administered 2012-05-07: 9 mg via INTRAVENOUS
  Administered 2012-05-07: 15.83 mg via INTRAVENOUS
  Administered 2012-05-07: 19.46 mg via INTRAVENOUS
  Filled 2012-05-06 (×2): qty 25

## 2012-05-06 MED ORDER — BUPIVACAINE-EPINEPHRINE 0.25% -1:200000 IJ SOLN
INTRAMUSCULAR | Status: DC | PRN
  Administered 2012-05-06: 10 mL

## 2012-05-06 MED ORDER — 0.9 % SODIUM CHLORIDE (POUR BTL) OPTIME
TOPICAL | Status: DC | PRN
  Administered 2012-05-06: 1000 mL

## 2012-05-06 MED ORDER — ACETAMINOPHEN 325 MG PO TABS: 650.0000 mg | ORAL_TABLET | ORAL | Status: DC | PRN

## 2012-05-06 MED ORDER — MEPERIDINE HCL 25 MG/ML IJ SOLN: 6.2500 mg | INTRAMUSCULAR | Status: DC | PRN

## 2012-05-06 MED ORDER — MENTHOL 3 MG MT LOZG: 1.0000 | LOZENGE | OROMUCOSAL | Status: DC | PRN

## 2012-05-06 MED ORDER — ACETAMINOPHEN 10 MG/ML IV SOLN: 1000.0000 mg | Freq: Once | INTRAVENOUS | Status: AC | PRN

## 2012-05-06 MED ORDER — PROPOFOL 10 MG/ML IV BOLUS
INTRAVENOUS | Status: DC | PRN
  Administered 2012-05-06: 150 mg via INTRAVENOUS

## 2012-05-06 MED ORDER — SENNOSIDES-DOCUSATE SODIUM 8.6-50 MG PO TABS: 1.0000 | ORAL_TABLET | Freq: Every evening | ORAL | Status: DC | PRN

## 2012-05-06 MED ORDER — CEFAZOLIN SODIUM 1-5 GM-% IV SOLN
INTRAVENOUS | Status: AC
  Filled 2012-05-06: qty 100

## 2012-05-06 MED ORDER — OXYCODONE HCL 5 MG PO TABS: 5.0000 mg | ORAL_TABLET | Freq: Once | ORAL | Status: DC | PRN

## 2012-05-06 MED ORDER — LIDOCAINE HCL (CARDIAC) 20 MG/ML IV SOLN
INTRAVENOUS | Status: DC | PRN
  Administered 2012-05-06: 100 mg via INTRAVENOUS

## 2012-05-06 MED ORDER — OXYCODONE-ACETAMINOPHEN 5-325 MG PO TABS: 1.0000 | ORAL_TABLET | ORAL | Status: DC | PRN

## 2012-05-06 MED ORDER — ONDANSETRON HCL 4 MG/2ML IJ SOLN
4.0000 mg | Freq: Four times a day (QID) | INTRAMUSCULAR | Status: DC | PRN
  Filled 2012-05-06 (×2): qty 2

## 2012-05-06 MED ORDER — OXYCODONE HCL 5 MG/5ML PO SOLN
5.0000 mg | Freq: Once | ORAL | Status: DC | PRN
Start: 2012-05-06 — End: 2012-05-06

## 2012-05-06 MED ORDER — HYDROMORPHONE HCL PF 1 MG/ML IJ SOLN
INTRAMUSCULAR | Status: AC
  Administered 2012-05-06: 0.5 mg via INTRAVENOUS
  Filled 2012-05-06: qty 1

## 2012-05-06 MED ORDER — DIPHENHYDRAMINE HCL 25 MG PO CAPS: 50.0000 mg | ORAL_CAPSULE | Freq: Every evening | ORAL | Status: DC | PRN

## 2012-05-06 MED ORDER — PHENOL 1.4 % MT LIQD: 1.0000 | OROMUCOSAL | Status: DC | PRN

## 2012-05-06 MED ORDER — SENNA 8.6 MG PO TABS
1.0000 | ORAL_TABLET | Freq: Two times a day (BID) | ORAL | Status: DC
  Administered 2012-05-06: 8.6 mg via ORAL
  Filled 2012-05-06 (×3): qty 1

## 2012-05-06 MED ORDER — BISACODYL 5 MG PO TBEC: 5.0000 mg | DELAYED_RELEASE_TABLET | Freq: Every day | ORAL | Status: DC | PRN

## 2012-05-06 MED ORDER — HYDROMORPHONE HCL PF 1 MG/ML IJ SOLN
INTRAMUSCULAR | Status: DC | PRN
Start: 2012-05-06 — End: 2012-05-06
  Administered 2012-05-06 (×2): 0.5 mg via INTRAVENOUS

## 2012-05-06 MED ORDER — LACTATED RINGERS IV SOLN
INTRAVENOUS | Status: DC
  Administered 2012-05-06: 23:00:00 via INTRAVENOUS

## 2012-05-06 MED ORDER — FLEET ENEMA 7-19 GM/118ML RE ENEM: 1.0000 | ENEMA | Freq: Once | RECTAL | Status: AC | PRN

## 2012-05-06 MED ORDER — MIDAZOLAM HCL 5 MG/5ML IJ SOLN
INTRAMUSCULAR | Status: DC | PRN
  Administered 2012-05-06: 2 mg via INTRAVENOUS

## 2012-05-06 MED ORDER — SODIUM CHLORIDE 0.9 % IJ SOLN: 9.0000 mL | INTRAMUSCULAR | Status: DC | PRN

## 2012-05-06 MED ORDER — HYDROCODONE-ACETAMINOPHEN 5-325 MG PO TABS
1.0000 | ORAL_TABLET | ORAL | Status: DC | PRN
Start: 2012-05-06 — End: 2012-05-07

## 2012-05-06 MED ORDER — ONDANSETRON HCL 4 MG/2ML IJ SOLN
INTRAMUSCULAR | Status: DC | PRN
  Administered 2012-05-06: 4 mg via INTRAVENOUS

## 2012-05-06 MED ORDER — DIAZEPAM 5 MG PO TABS: 5.0000 mg | ORAL_TABLET | Freq: Four times a day (QID) | ORAL | Status: DC | PRN

## 2012-05-06 MED ORDER — BUPIVACAINE-EPINEPHRINE PF 0.25-1:200000 % IJ SOLN
INTRAMUSCULAR | Status: AC
  Filled 2012-05-06: qty 30

## 2012-05-06 MED ORDER — ONDANSETRON HCL 4 MG/2ML IJ SOLN
4.0000 mg | INTRAMUSCULAR | Status: DC | PRN
  Administered 2012-05-06 – 2012-05-07 (×2): 4 mg via INTRAVENOUS

## 2012-05-06 MED ORDER — PROMETHAZINE HCL 25 MG/ML IJ SOLN: 6.2500 mg | INTRAMUSCULAR | Status: DC | PRN

## 2012-05-06 MED ORDER — ALUM & MAG HYDROXIDE-SIMETH 200-200-20 MG/5ML PO SUSP: 30.0000 mL | Freq: Four times a day (QID) | ORAL | Status: DC | PRN

## 2012-05-06 MED ORDER — HYDROMORPHONE HCL PF 1 MG/ML IJ SOLN
0.2500 mg | INTRAMUSCULAR | Status: DC | PRN
  Administered 2012-05-06 (×2): 0.25 mg via INTRAVENOUS

## 2012-05-06 MED ORDER — DOCUSATE SODIUM 100 MG PO CAPS: 100.0000 mg | ORAL_CAPSULE | Freq: Three times a day (TID) | ORAL | Status: DC | PRN

## 2012-05-06 MED ORDER — PNEUMOCOCCAL VAC POLYVALENT 25 MCG/0.5ML IJ INJ: 0.5000 mL | INJECTION | INTRAMUSCULAR | Status: DC | PRN

## 2012-05-06 MED ORDER — ACETAMINOPHEN 10 MG/ML IV SOLN
INTRAVENOUS | Status: AC
  Administered 2012-05-06: 1000 mg via INTRAVENOUS
  Filled 2012-05-06: qty 100

## 2012-05-06 MED ORDER — ZOLPIDEM TARTRATE 5 MG PO TABS: 5.0000 mg | ORAL_TABLET | Freq: Every evening | ORAL | Status: DC | PRN

## 2012-05-06 MED ORDER — MORPHINE SULFATE 2 MG/ML IJ SOLN: 1.0000 mg | INTRAMUSCULAR | Status: DC | PRN

## 2012-05-06 MED ORDER — SODIUM CHLORIDE 0.9 % IV SOLN: 250.0000 mL | INTRAVENOUS | Status: DC

## 2012-05-06 MED ORDER — DIPHENHYDRAMINE HCL 50 MG/ML IJ SOLN: 12.5000 mg | Freq: Four times a day (QID) | INTRAMUSCULAR | Status: DC | PRN

## 2012-05-06 MED ORDER — LACTATED RINGERS IV SOLN
INTRAVENOUS | Status: DC | PRN
  Administered 2012-05-06 (×4): via INTRAVENOUS

## 2012-05-06 MED ORDER — THROMBIN 20000 UNITS EX SOLR
CUTANEOUS | Status: DC | PRN
  Administered 2012-05-06: 10:00:00 via TOPICAL

## 2012-05-06 MED ORDER — ACETAMINOPHEN 650 MG RE SUPP: 650.0000 mg | RECTAL | Status: DC | PRN

## 2012-05-06 MED ORDER — THROMBIN 20000 UNITS EX SOLR
CUTANEOUS | Status: AC
  Filled 2012-05-06: qty 20000

## 2012-05-06 SURGICAL SUPPLY — 69 items
APL SKNCLS STERI-STRIP NONHPOA (GAUZE/BANDAGES/DRESSINGS) ×2
BENZOIN TINCTURE PRP APPL 2/3 (GAUZE/BANDAGES/DRESSINGS) ×2 IMPLANT
BLADE SURG 10 STRL SS (BLADE) ×2 IMPLANT
BLADE SURG ROTATE 9660 (MISCELLANEOUS) IMPLANT
CLOTH BEACON ORANGE TIMEOUT ST (SAFETY) ×2 IMPLANT
CLSR STERI-STRIP ANTIMIC 1/2X4 (GAUZE/BANDAGES/DRESSINGS) ×2 IMPLANT
CORDS BIPOLAR (ELECTRODE) ×2 IMPLANT
COVER SURGICAL LIGHT HANDLE (MISCELLANEOUS) ×2 IMPLANT
CoRoent XL 8 x 18 x 50mm (Cage) ×2 IMPLANT
DRAPE C-ARM 42X72 X-RAY (DRAPES) ×2 IMPLANT
DRAPE C-ARMOR (DRAPES) ×1 IMPLANT
DRAPE INCISE IOBAN 66X45 STRL (DRAPES) ×1 IMPLANT
DRAPE POUCH INSTRU U-SHP 10X18 (DRAPES) ×2 IMPLANT
DRAPE PROXIMA HALF (DRAPES) ×1 IMPLANT
DRAPE SURG 17X23 STRL (DRAPES) ×2 IMPLANT
DRAPE U-SHAPE 47X51 STRL (DRAPES) ×2 IMPLANT
DRSG MEPILEX BORDER 4X8 (GAUZE/BANDAGES/DRESSINGS) ×2 IMPLANT
DURAPREP 26ML APPLICATOR (WOUND CARE) ×2 IMPLANT
ELECT BLADE 6.5 EXT (BLADE) ×2 IMPLANT
ELECT CAUTERY BLADE 6.4 (BLADE) ×2 IMPLANT
ELECT REM PT RETURN 9FT ADLT (ELECTROSURGICAL) ×2
ELECTRODE REM PT RTRN 9FT ADLT (ELECTROSURGICAL) ×1 IMPLANT
GAUZE SPONGE 4X4 16PLY XRAY LF (GAUZE/BANDAGES/DRESSINGS) ×2 IMPLANT
GLOVE BIO SURGEON STRL SZ7 (GLOVE) ×4 IMPLANT
GLOVE BIO SURGEON STRL SZ8 (GLOVE) ×2 IMPLANT
GLOVE BIOGEL PI IND STRL 7.0 (GLOVE) ×1 IMPLANT
GLOVE BIOGEL PI IND STRL 8 (GLOVE) ×1 IMPLANT
GLOVE BIOGEL PI INDICATOR 7.0 (GLOVE) ×1
GLOVE BIOGEL PI INDICATOR 8 (GLOVE) ×1
GOWN STRL NON-REIN LRG LVL3 (GOWN DISPOSABLE) ×2 IMPLANT
GOWN STRL REIN XL XLG (GOWN DISPOSABLE) ×4 IMPLANT
IMPLANT COROENT LDTXL 10X18X50 (Intraocular Lens) ×1 IMPLANT
KIT BASIN OR (CUSTOM PROCEDURE TRAY) ×2 IMPLANT
KIT DILATOR XLIF 5 (KITS) IMPLANT
KIT MAXCESS (KITS) ×1 IMPLANT
KIT NDL NVM5 EMG ELECT (KITS) IMPLANT
KIT NEEDLE NVM5 EMG ELECT (KITS) ×1 IMPLANT
KIT NEEDLE NVM5 EMG ELECTRODE (KITS) ×1
KIT ROOM TURNOVER OR (KITS) ×2 IMPLANT
KIT XLIF (KITS) ×1
MIX DBX 20CC MTF (Putty) ×1 IMPLANT
NDL HYPO 25GX1X1/2 BEV (NEEDLE) ×1 IMPLANT
NDL SPNL 18GX3.5 QUINCKE PK (NEEDLE) ×1 IMPLANT
NEEDLE HYPO 25GX1X1/2 BEV (NEEDLE) ×2 IMPLANT
NEEDLE SPNL 18GX3.5 QUINCKE PK (NEEDLE) ×2 IMPLANT
NS IRRIG 1000ML POUR BTL (IV SOLUTION) ×2 IMPLANT
PACK LAMINECTOMY ORTHO (CUSTOM PROCEDURE TRAY) ×2 IMPLANT
PACK UNIVERSAL I (CUSTOM PROCEDURE TRAY) ×2 IMPLANT
PAD ARMBOARD 7.5X6 YLW CONV (MISCELLANEOUS) ×4 IMPLANT
SPONGE GAUZE 4X4 12PLY (GAUZE/BANDAGES/DRESSINGS) ×2 IMPLANT
SPONGE INTESTINAL PEANUT (DISPOSABLE) ×3 IMPLANT
SPONGE LAP 4X18 X RAY DECT (DISPOSABLE) ×2 IMPLANT
SPONGE SURGIFOAM ABS GEL 100 (HEMOSTASIS) ×1 IMPLANT
STRIP CLOSURE SKIN 1/2X4 (GAUZE/BANDAGES/DRESSINGS) IMPLANT
SURGIFLO TRUKIT (HEMOSTASIS) IMPLANT
SUT MNCRL AB 4-0 PS2 18 (SUTURE) ×2 IMPLANT
SUT PDS AB 1 CTX 36 (SUTURE) ×2 IMPLANT
SUT PROLENE 5 0 C 1 24 (SUTURE) IMPLANT
SUT SILK 2 0 TIES 10X30 (SUTURE) IMPLANT
SUT VIC AB 0 CT1 18XCR BRD 8 (SUTURE) ×1 IMPLANT
SUT VIC AB 0 CT1 8-18 (SUTURE) ×2
SUT VIC AB 2-0 CT2 18 VCP726D (SUTURE) ×3 IMPLANT
SYR BULB IRRIGATION 50ML (SYRINGE) ×2 IMPLANT
TAPE CLOTH SURG 6X10 WHT LF (GAUZE/BANDAGES/DRESSINGS) ×2 IMPLANT
TOWEL OR 17X24 6PK STRL BLUE (TOWEL DISPOSABLE) ×2 IMPLANT
TOWEL OR 17X26 10 PK STRL BLUE (TOWEL DISPOSABLE) ×4 IMPLANT
TRAY FOLEY CATH 14FRSI W/METER (CATHETERS) ×2 IMPLANT
WATER STERILE IRR 1000ML POUR (IV SOLUTION) ×1 IMPLANT
YANKAUER SUCT BULB TIP NO VENT (SUCTIONS) ×3 IMPLANT

## 2012-05-06 NOTE — H&P (Signed)
PREOPERATIVE H&P  Chief Complaint: Right leg pain, low back pain x 3 years.   HPI: Jasmine Mclaughlin is a 62 y.o. female who presents with 3 year h/o right leg pain. Failed conservative care.  Past Medical History  Diagnosis Date  . Colon polyps   . Genital warts   . Asthma   . Shortness of breath     pt./ reports that she is out of shape  . Arthritis     degenerative back, joint disturbance "everywhere "   Past Surgical History  Procedure Laterality Date  . Knee arthroscopy       R knee 01/2012, for ligament tears  . Tonsillectomy    . Vaginal delivery      x1   History   Social History  . Marital Status: Single    Spouse Name: N/A    Number of Children: 1  . Years of Education: N/A   Occupational History  . [employer]     sits in front of computer   Social History Main Topics  . Smoking status: Current Every Day Smoker -- 45 years    Types: Cigarettes  . Smokeless tobacco: Never Used     Comment: she is thinking about it   . Alcohol Use: Yes     Comment: 2 times per month  . Drug Use: No  . Sexually Active: Yes -- Female partner(s)   Other Topics Concern  . None   Social History Narrative   1 caffeine drinks daily    Family History  Problem Relation Age of Onset  . Arthritis Mother   . Arthritis Father   . Stroke Father   . Colon cancer Father 24  . Heart disease Father 47    chf  . Breast cancer Maternal Grandmother    No Known Allergies Prior to Admission medications   Medication Sig Start Date End Date Taking? Authorizing Provider  calcium carbonate (OS-CAL) 600 MG TABS Take 600 mg by mouth 3 (three) times daily.   Yes Historical Provider, MD  celecoxib (CELEBREX) 200 MG capsule Take 200 mg by mouth daily before breakfast.    Yes Historical Provider, MD  cyclobenzaprine (FLEXERIL) 10 MG tablet Take 10 mg by mouth 2 (two) times daily.   Yes Historical Provider, MD  diphenhydrAMINE (BENADRYL) 50 MG capsule Take 50 mg by mouth at bedtime as needed for itching.    Yes Historical Provider, MD  docusate sodium (COLACE) 100 MG capsule Take 100 mg by mouth 3 (three) times daily as needed for constipation.   Yes Historical Provider, MD  HYDROcodone-acetaminophen (NORCO) 5-325 MG per tablet Take 1 tablet by mouth 2 (two) times daily.  08/16/10  Yes Historical Provider, MD     All other systems have been reviewed and were otherwise negative with the exception of those mentioned in the HPI and as above.  Physical Exam: Filed Vitals:   05/06/12 0616  BP: 131/86  Pulse: 81  Temp: 98.8 F (37.1 C)  Resp: 18    General: Alert, no acute distress Cardiovascular: No pedal edema Respiratory: No cyanosis, no use of accessory musculature GI: No organomegaly, abdomen is soft and non-tender Skin: No lesions in the area of chief complaint Neurologic: Sensation intact distally Psychiatric: Patient is competent for consent with normal mood and affect Lymphatic: No axillary or cervical lymphadenopathy  MUSCULOSKELETAL: + SLR on right  MRI: severe stenosis L4/5, DDD L2-5  Assessment/Plan: Right leg pain with low back pain Plan for Procedure(s): ANTERIOR  LATERAL LUMBAR FUSION L2-5 from left approach   Emilee Hero, MD 05/06/2012 8:22 AM

## 2012-05-06 NOTE — Anesthesia Procedure Notes (Signed)
Procedure Name: Intubation Date/Time: 05/06/2012 8:48 AM Performed by: Rogelia Boga Pre-anesthesia Checklist: Patient identified, Emergency Drugs available, Suction available, Patient being monitored and Timeout performed Patient Re-evaluated:Patient Re-evaluated prior to inductionOxygen Delivery Method: Circle system utilized Preoxygenation: Pre-oxygenation with 100% oxygen Intubation Type: IV induction Ventilation: Mask ventilation without difficulty Laryngoscope Size: Mac and 4 Grade View: Grade I Tube type: Oral Tube size: 7.5 mm Number of attempts: 1 Airway Equipment and Method: Stylet Placement Confirmation: ETT inserted through vocal cords under direct vision,  positive ETCO2 and breath sounds checked- equal and bilateral Secured at: 21 cm Tube secured with: Tape Dental Injury: Teeth and Oropharynx as per pre-operative assessment

## 2012-05-06 NOTE — Anesthesia Postprocedure Evaluation (Signed)
  Anesthesia Post-op Note  Patient: Jasmine Mclaughlin  Procedure(s) Performed: Procedure(s) with comments: ANTERIOR LATERAL LUMBAR FUSION 3 LEVELS (N/A) - Lateral interbody fusion, lumbar 2-3,lumbar 3-4, lumbar 4-5 with instrumentation and allograft.  Patient Location: PACU  Anesthesia Type:General  Level of Consciousness: awake, alert  and oriented  Airway and Oxygen Therapy: Patient Spontanous Breathing and Patient connected to nasal cannula oxygen  Post-op Pain: mild  Post-op Assessment: Post-op Vital signs reviewed  Post-op Vital Signs: Reviewed  Complications: No apparent anesthesia complications

## 2012-05-06 NOTE — Preoperative (Signed)
Beta Blockers   Reason not to administer Beta Blockers:Not Applicable 

## 2012-05-06 NOTE — Anesthesia Preprocedure Evaluation (Addendum)
Anesthesia Evaluation  Patient identified by MRN, date of birth, ID band Patient awake    Reviewed: Allergy & Precautions, H&P , NPO status , Patient's Chart, lab work & pertinent test results  Airway Mallampati: II TM Distance: >3 FB Neck ROM: Full    Dental  (+) Dental Advisory Given, Edentulous Upper and Edentulous Lower   Pulmonary shortness of breath and with exertion, asthma , Current Smoker,  breath sounds clear to auscultation        Cardiovascular negative cardio ROS  Rhythm:Regular Rate:Normal     Neuro/Psych negative neurological ROS  negative psych ROS   GI/Hepatic negative GI ROS, Neg liver ROS,   Endo/Other  negative endocrine ROS  Renal/GU negative Renal ROS     Musculoskeletal negative musculoskeletal ROS (+)   Abdominal (+) + obese,   Peds  Hematology negative hematology ROS (+)   Anesthesia Other Findings   Reproductive/Obstetrics negative OB ROS                          Anesthesia Physical Anesthesia Plan  ASA: II  Anesthesia Plan: General   Post-op Pain Management:    Induction: Intravenous  Airway Management Planned: Oral ETT  Additional Equipment:   Intra-op Plan:   Post-operative Plan: Extubation in OR  Informed Consent: I have reviewed the patients History and Physical, chart, labs and discussed the procedure including the risks, benefits and alternatives for the proposed anesthesia with the patient or authorized representative who has indicated his/her understanding and acceptance.   Dental advisory given  Plan Discussed with: CRNA, Anesthesiologist and Surgeon  Anesthesia Plan Comments:        Anesthesia Quick Evaluation

## 2012-05-06 NOTE — Transfer of Care (Signed)
Immediate Anesthesia Transfer of Care Note  Patient: Rayvon Brandvold Nephew  Procedure(s) Performed: Procedure(s) with comments: ANTERIOR LATERAL LUMBAR FUSION 3 LEVELS (N/A) - Lateral interbody fusion, lumbar 2-3,lumbar 3-4, lumbar 4-5 with instrumentation and allograft.  Patient Location: PACU  Anesthesia Type:General  Level of Consciousness: awake and patient cooperative  Airway & Oxygen Therapy: Patient Spontanous Breathing and Patient connected to nasal cannula oxygen  Post-op Assessment: Report given to PACU RN, Post -op Vital signs reviewed and stable and Patient moving all extremities X 4  Post vital signs: Reviewed and stable  Complications: No apparent anesthesia complications

## 2012-05-07 ENCOUNTER — Encounter (HOSPITAL_COMMUNITY)
Admission: RE | Disposition: A | Discharge status: Home / Self Care | Payer: Self-pay | Source: Ambulatory Visit | Attending: Orthopedic Surgery

## 2012-05-07 ENCOUNTER — Inpatient Hospital Stay (HOSPITAL_COMMUNITY): Admission: RE | Admit: 2012-05-07 | Payer: 59 | Source: Ambulatory Visit | Admitting: Orthopedic Surgery

## 2012-05-07 ENCOUNTER — Encounter (HOSPITAL_COMMUNITY): Payer: Self-pay | Admitting: Anesthesiology

## 2012-05-07 ENCOUNTER — Inpatient Hospital Stay (HOSPITAL_COMMUNITY): Payer: 59 | Admitting: Anesthesiology

## 2012-05-07 ENCOUNTER — Inpatient Hospital Stay (HOSPITAL_COMMUNITY): Payer: 59

## 2012-05-07 IMAGING — RF DG C-ARM GT 120 MIN
1 series · 2 of 2 positions shown · non-contrast
Comparison: MRI [DATE]

CLINICAL DATA: Back pain

DG C-ARM GT 120 MIN,LUMBAR SPINE - 2-3 VIEW
TECHNIQUE: C-arm fluoroscopic images were obtained
intraoperatively and submitted for postoperative interpretation.
Please see the performing provider's procedural report for the
fluoroscopy time utilized.

[Series 1: run · 2 of 2 slices shown]
[im 1/2]
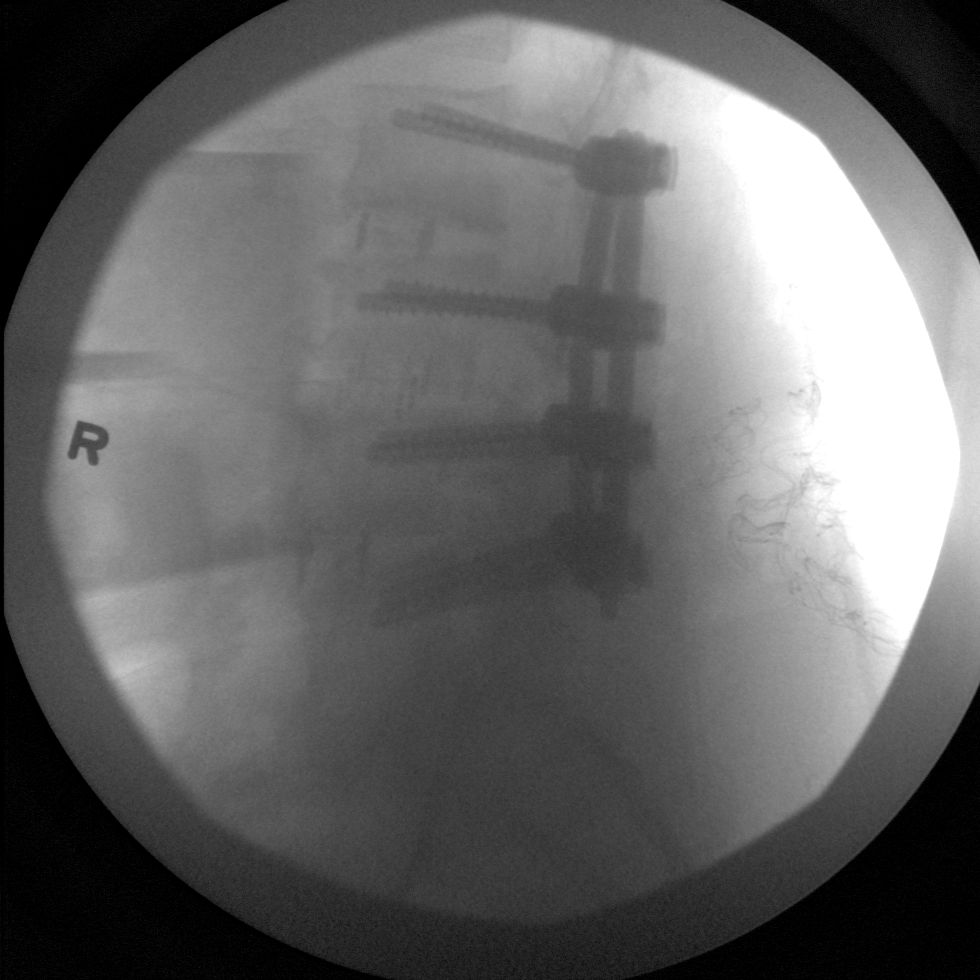
[im 2/2]
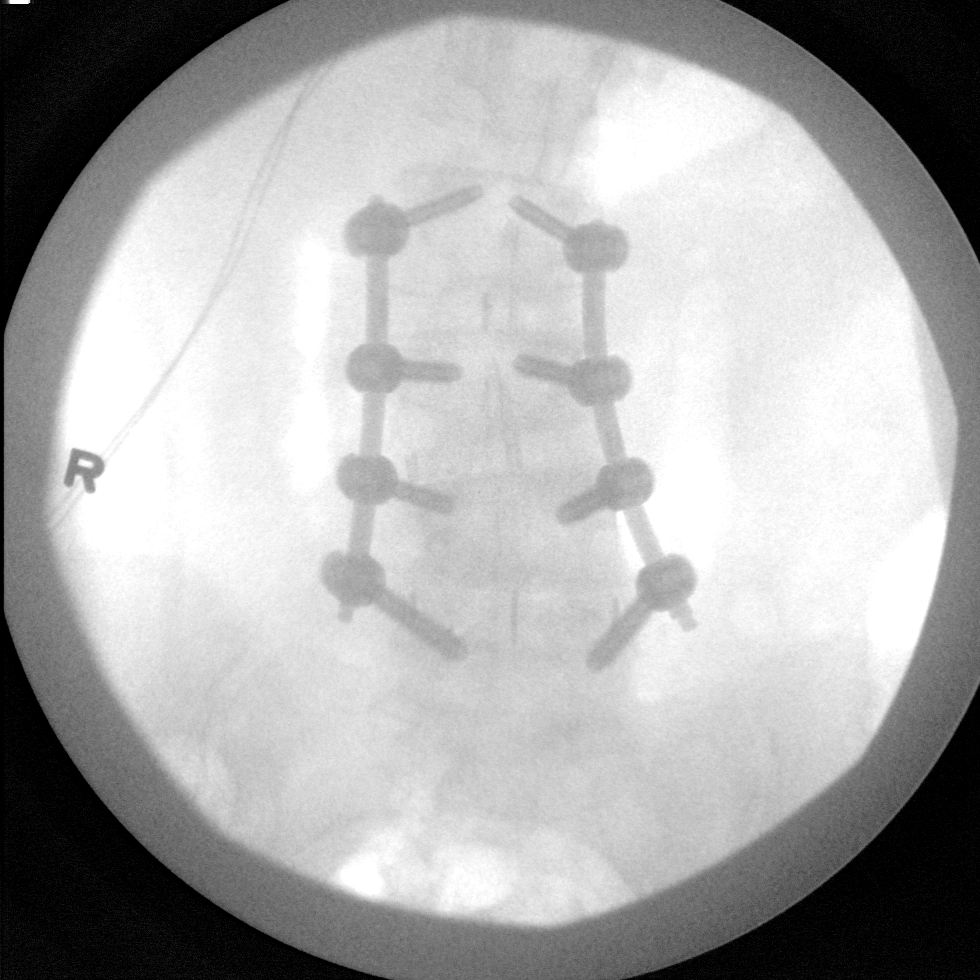

[2 of 2 positions shown; findings below may reference images not displayed]

FINDINGS: C-arm films document L2-L5 posterolateral fusion.
IMPRESSION: As above.

## 2012-05-07 SURGERY — POSTERIOR LUMBAR FUSION 3 LEVEL
Anesthesia: General | Site: Back | Wound class: Clean

## 2012-05-07 MED ORDER — MIDAZOLAM HCL 5 MG/5ML IJ SOLN
INTRAMUSCULAR | Status: DC | PRN
  Administered 2012-05-07: 2 mg via INTRAVENOUS

## 2012-05-07 MED ORDER — LACTATED RINGERS IV SOLN
INTRAVENOUS | Status: DC | PRN
  Administered 2012-05-07 (×2): via INTRAVENOUS

## 2012-05-07 MED ORDER — SODIUM CHLORIDE 0.9 % IV SOLN
INTRAVENOUS | Status: DC
  Administered 2012-05-07: 17:00:00 via INTRAVENOUS

## 2012-05-07 MED ORDER — DIPHENHYDRAMINE HCL 50 MG/ML IJ SOLN: 12.5000 mg | Freq: Four times a day (QID) | INTRAMUSCULAR | Status: DC | PRN

## 2012-05-07 MED ORDER — ONDANSETRON HCL 4 MG/2ML IJ SOLN
INTRAMUSCULAR | Status: DC | PRN
  Administered 2012-05-07: 4 mg via INTRAVENOUS

## 2012-05-07 MED ORDER — PROPOFOL 10 MG/ML IV BOLUS
INTRAVENOUS | Status: DC | PRN
  Administered 2012-05-07: 150 mg via INTRAVENOUS
  Administered 2012-05-07: 50 mg via INTRAVENOUS

## 2012-05-07 MED ORDER — MENTHOL 3 MG MT LOZG: 1.0000 | LOZENGE | OROMUCOSAL | Status: DC | PRN

## 2012-05-07 MED ORDER — PROPOFOL INFUSION 10 MG/ML OPTIME
INTRAVENOUS | Status: DC | PRN
  Administered 2012-05-07: 75 ug/kg/min via INTRAVENOUS
  Administered 2012-05-07: 12:00:00 via INTRAVENOUS

## 2012-05-07 MED ORDER — DIAZEPAM 5 MG PO TABS
5.0000 mg | ORAL_TABLET | Freq: Four times a day (QID) | ORAL | Status: DC | PRN
  Administered 2012-05-08 – 2012-05-10 (×7): 5 mg via ORAL
  Filled 2012-05-07 (×7): qty 1

## 2012-05-07 MED ORDER — ACETAMINOPHEN 650 MG RE SUPP: 650.0000 mg | RECTAL | Status: DC | PRN

## 2012-05-07 MED ORDER — SODIUM CHLORIDE 0.9 % IJ SOLN: 9.0000 mL | INTRAMUSCULAR | Status: DC | PRN

## 2012-05-07 MED ORDER — ARTIFICIAL TEARS OP OINT
TOPICAL_OINTMENT | OPHTHALMIC | Status: DC | PRN
  Administered 2012-05-07: 1 via OPHTHALMIC

## 2012-05-07 MED ORDER — DIPHENHYDRAMINE HCL 12.5 MG/5ML PO ELIX: 12.5000 mg | ORAL_SOLUTION | Freq: Four times a day (QID) | ORAL | Status: DC | PRN

## 2012-05-07 MED ORDER — LIDOCAINE HCL (CARDIAC) 20 MG/ML IV SOLN
INTRAVENOUS | Status: DC | PRN
  Administered 2012-05-07: 70 mg via INTRAVENOUS
  Administered 2012-05-07: 50 mg via INTRAVENOUS

## 2012-05-07 MED ORDER — NALOXONE HCL 0.4 MG/ML IJ SOLN: 0.4000 mg | INTRAMUSCULAR | Status: DC | PRN

## 2012-05-07 MED ORDER — CEFAZOLIN SODIUM-DEXTROSE 2-3 GM-% IV SOLR
INTRAVENOUS | Status: DC | PRN
  Administered 2012-05-07 (×2): 2 g via INTRAVENOUS

## 2012-05-07 MED ORDER — ALUM & MAG HYDROXIDE-SIMETH 200-200-20 MG/5ML PO SUSP: 30.0000 mL | Freq: Four times a day (QID) | ORAL | Status: DC | PRN

## 2012-05-07 MED ORDER — SENNOSIDES-DOCUSATE SODIUM 8.6-50 MG PO TABS: 1.0000 | ORAL_TABLET | Freq: Every evening | ORAL | Status: DC | PRN

## 2012-05-07 MED ORDER — OXYCODONE HCL 5 MG PO TABS: 5.0000 mg | ORAL_TABLET | Freq: Once | ORAL | Status: AC | PRN

## 2012-05-07 MED ORDER — ACETAMINOPHEN 10 MG/ML IV SOLN
INTRAVENOUS | Status: DC | PRN
  Administered 2012-05-07: 1000 mg via INTRAVENOUS

## 2012-05-07 MED ORDER — SODIUM CHLORIDE 0.9 % IJ SOLN: 3.0000 mL | INTRAMUSCULAR | Status: DC | PRN

## 2012-05-07 MED ORDER — GLYCOPYRROLATE 0.2 MG/ML IJ SOLN
INTRAMUSCULAR | Status: DC | PRN
  Administered 2012-05-07: 0.2 mg via INTRAVENOUS

## 2012-05-07 MED ORDER — ONDANSETRON HCL 4 MG/2ML IJ SOLN: 4.0000 mg | Freq: Four times a day (QID) | INTRAMUSCULAR | Status: DC | PRN

## 2012-05-07 MED ORDER — DEXTROSE 5 % IV SOLN
INTRAVENOUS | Status: DC | PRN
  Administered 2012-05-07: 08:00:00 via INTRAVENOUS

## 2012-05-07 MED ORDER — BISACODYL 5 MG PO TBEC: 5.0000 mg | DELAYED_RELEASE_TABLET | Freq: Every day | ORAL | Status: DC | PRN

## 2012-05-07 MED ORDER — DOCUSATE SODIUM 100 MG PO CAPS
100.0000 mg | ORAL_CAPSULE | Freq: Two times a day (BID) | ORAL | Status: DC
  Administered 2012-05-07 – 2012-05-11 (×8): 100 mg via ORAL
  Filled 2012-05-07 (×9): qty 1

## 2012-05-07 MED ORDER — PHENOL 1.4 % MT LIQD: 1.0000 | OROMUCOSAL | Status: DC | PRN

## 2012-05-07 MED ORDER — HYDROMORPHONE HCL PF 1 MG/ML IJ SOLN
0.2500 mg | INTRAMUSCULAR | Status: DC | PRN
  Administered 2012-05-07: 0.5 mg via INTRAVENOUS

## 2012-05-07 MED ORDER — OXYCODONE-ACETAMINOPHEN 5-325 MG PO TABS
1.0000 | ORAL_TABLET | ORAL | Status: DC | PRN
  Administered 2012-05-10 (×4): 2 via ORAL
  Filled 2012-05-07 (×4): qty 2

## 2012-05-07 MED ORDER — ONDANSETRON HCL 4 MG/2ML IJ SOLN
4.0000 mg | INTRAMUSCULAR | Status: DC | PRN
  Administered 2012-05-08: 4 mg via INTRAVENOUS

## 2012-05-07 MED ORDER — MORPHINE SULFATE 2 MG/ML IJ SOLN: 1.0000 mg | INTRAMUSCULAR | Status: DC | PRN

## 2012-05-07 MED ORDER — SODIUM CHLORIDE 0.9 % IV SOLN
10.0000 mg | INTRAVENOUS | Status: DC | PRN
  Administered 2012-05-07: 10 ug/min via INTRAVENOUS

## 2012-05-07 MED ORDER — ONDANSETRON HCL 4 MG/2ML IJ SOLN
4.0000 mg | Freq: Four times a day (QID) | INTRAMUSCULAR | Status: DC | PRN
  Filled 2012-05-07: qty 2

## 2012-05-07 MED ORDER — THROMBIN 20000 UNITS EX SOLR
OROMUCOSAL | Status: DC | PRN
  Administered 2012-05-07: 09:00:00 via TOPICAL

## 2012-05-07 MED ORDER — DEXAMETHASONE SODIUM PHOSPHATE 4 MG/ML IJ SOLN
INTRAMUSCULAR | Status: DC | PRN
  Administered 2012-05-07: 8 mg via INTRAVENOUS

## 2012-05-07 MED ORDER — HYDROMORPHONE HCL PF 1 MG/ML IJ SOLN
INTRAMUSCULAR | Status: AC
  Administered 2012-05-07: 0.5 mg via INTRAVENOUS
  Filled 2012-05-07: qty 1

## 2012-05-07 MED ORDER — HYDROMORPHONE 0.3 MG/ML IV SOLN
INTRAVENOUS | Status: DC
  Administered 2012-05-07: 0.799 mg via INTRAVENOUS
  Administered 2012-05-07: 17:00:00 via INTRAVENOUS
  Administered 2012-05-07: 1.49 mg via INTRAVENOUS
  Administered 2012-05-08: 2.59 mg via INTRAVENOUS
  Administered 2012-05-08: 06:00:00 via INTRAVENOUS
  Filled 2012-05-07: qty 25

## 2012-05-07 MED ORDER — FENTANYL CITRATE 0.05 MG/ML IJ SOLN
INTRAMUSCULAR | Status: DC | PRN
  Administered 2012-05-07 (×2): 50 ug via INTRAVENOUS
  Administered 2012-05-07: 100 ug via INTRAVENOUS
  Administered 2012-05-07 (×5): 50 ug via INTRAVENOUS
  Administered 2012-05-07: 100 ug via INTRAVENOUS
  Administered 2012-05-07 (×2): 50 ug via INTRAVENOUS

## 2012-05-07 MED ORDER — FLEET ENEMA 7-19 GM/118ML RE ENEM: 1.0000 | ENEMA | Freq: Once | RECTAL | Status: AC | PRN

## 2012-05-07 MED ORDER — OXYCODONE HCL 5 MG/5ML PO SOLN
5.0000 mg | Freq: Once | ORAL | Status: AC | PRN
Start: 2012-05-07 — End: 2012-05-07

## 2012-05-07 MED ORDER — SODIUM CHLORIDE 0.9 % IJ SOLN
3.0000 mL | Freq: Two times a day (BID) | INTRAMUSCULAR | Status: DC
  Administered 2012-05-08 – 2012-05-09 (×3): 3 mL via INTRAVENOUS

## 2012-05-07 MED ORDER — CEFAZOLIN SODIUM 1-5 GM-% IV SOLN
1.0000 g | Freq: Three times a day (TID) | INTRAVENOUS | Status: AC
  Administered 2012-05-07 – 2012-05-08 (×2): 1 g via INTRAVENOUS
  Filled 2012-05-07 (×2): qty 50

## 2012-05-07 MED ORDER — HYDROMORPHONE 0.3 MG/ML IV SOLN
INTRAVENOUS | Status: AC
  Filled 2012-05-07: qty 25

## 2012-05-07 MED ORDER — MORPHINE SULFATE (PF) 1 MG/ML IV SOLN: INTRAVENOUS | Status: DC

## 2012-05-07 MED ORDER — BUPIVACAINE-EPINEPHRINE 0.25% -1:200000 IJ SOLN
INTRAMUSCULAR | Status: DC | PRN
  Administered 2012-05-07: 9 mL

## 2012-05-07 MED ORDER — ACETAMINOPHEN 325 MG PO TABS: 650.0000 mg | ORAL_TABLET | ORAL | Status: DC | PRN

## 2012-05-07 MED ORDER — ALBUMIN HUMAN 5 % IV SOLN
INTRAVENOUS | Status: DC | PRN
  Administered 2012-05-07: 10:00:00 via INTRAVENOUS

## 2012-05-07 MED ORDER — HYDROCODONE-ACETAMINOPHEN 5-325 MG PO TABS
1.0000 | ORAL_TABLET | ORAL | Status: DC | PRN
  Administered 2012-05-08: 1 via ORAL
  Administered 2012-05-08 – 2012-05-11 (×10): 2 via ORAL
  Filled 2012-05-07: qty 1
  Filled 2012-05-07 (×10): qty 2

## 2012-05-07 MED ORDER — SUCCINYLCHOLINE CHLORIDE 20 MG/ML IJ SOLN
INTRAMUSCULAR | Status: DC | PRN
  Administered 2012-05-07: 100 mg via INTRAVENOUS

## 2012-05-07 MED ORDER — SODIUM CHLORIDE 0.9 % IV SOLN: 250.0000 mL | INTRAVENOUS | Status: DC

## 2012-05-07 MED ORDER — METOCLOPRAMIDE HCL 5 MG/ML IJ SOLN
INTRAMUSCULAR | Status: DC | PRN
  Administered 2012-05-07: 10 mg via INTRAVENOUS

## 2012-05-07 MED ORDER — ZOLPIDEM TARTRATE 5 MG PO TABS: 5.0000 mg | ORAL_TABLET | Freq: Every evening | ORAL | Status: DC | PRN

## 2012-05-07 MED FILL — Heparin Sodium (Porcine) Inj 1000 Unit/ML: INTRAMUSCULAR | Qty: 30 | Status: AC

## 2012-05-07 MED FILL — Sodium Chloride IV Soln 0.9%: INTRAVENOUS | Qty: 1000 | Status: AC

## 2012-05-07 SURGICAL SUPPLY — 93 items
APL SKNCLS STERI-STRIP NONHPOA (GAUZE/BANDAGES/DRESSINGS) ×2
BENZOIN TINCTURE PRP APPL 2/3 (GAUZE/BANDAGES/DRESSINGS) ×2 IMPLANT
BLADE SURG ROTATE 9660 (MISCELLANEOUS) IMPLANT
BUR ROUND PRECISION 4.0 (BURR) ×2 IMPLANT
CARTRIDGE OIL MAESTRO DRILL (MISCELLANEOUS) ×1 IMPLANT
CLOTH BEACON ORANGE TIMEOUT ST (SAFETY) ×2 IMPLANT
CLSR STERI-STRIP ANTIMIC 1/2X4 (GAUZE/BANDAGES/DRESSINGS) ×1 IMPLANT
CONT SPEC STER OR (MISCELLANEOUS) ×2 IMPLANT
CORDS BIPOLAR (ELECTRODE) ×2 IMPLANT
COVER SURGICAL LIGHT HANDLE (MISCELLANEOUS) ×2 IMPLANT
DIFFUSER DRILL AIR PNEUMATIC (MISCELLANEOUS) ×2 IMPLANT
DRAIN CHANNEL 15F RND FF W/TCR (WOUND CARE) ×2 IMPLANT
DRAPE C-ARM 42X72 X-RAY (DRAPES) ×2 IMPLANT
DRAPE ORTHO SPLIT 77X108 STRL (DRAPES) ×2
DRAPE POUCH INSTRU U-SHP 10X18 (DRAPES) ×2 IMPLANT
DRAPE SURG 17X23 STRL (DRAPES) ×8 IMPLANT
DRAPE SURG ORHT 6 SPLT 77X108 (DRAPES) ×1 IMPLANT
DRSG MEPILEX BORDER 4X12 (GAUZE/BANDAGES/DRESSINGS) IMPLANT
DRSG MEPILEX BORDER 4X8 (GAUZE/BANDAGES/DRESSINGS) IMPLANT
DURAPREP 26ML APPLICATOR (WOUND CARE) ×2 IMPLANT
ELECT BLADE 4.0 EZ CLEAN MEGAD (MISCELLANEOUS) ×2
ELECT CAUTERY BLADE 6.4 (BLADE) ×4 IMPLANT
ELECT REM PT RETURN 9FT ADLT (ELECTROSURGICAL) ×2
ELECTRODE BLDE 4.0 EZ CLN MEGD (MISCELLANEOUS) ×1 IMPLANT
ELECTRODE REM PT RTRN 9FT ADLT (ELECTROSURGICAL) ×1 IMPLANT
EVACUATOR SILICONE 100CC (DRAIN) ×2 IMPLANT
GAUZE SPONGE 4X4 16PLY XRAY LF (GAUZE/BANDAGES/DRESSINGS) ×10 IMPLANT
GLOVE BIO SURGEON STRL SZ7 (GLOVE) ×2 IMPLANT
GLOVE BIO SURGEON STRL SZ8 (GLOVE) ×2 IMPLANT
GLOVE BIOGEL PI IND STRL 7.0 (GLOVE) ×1 IMPLANT
GLOVE BIOGEL PI IND STRL 8 (GLOVE) ×1 IMPLANT
GLOVE BIOGEL PI INDICATOR 7.0 (GLOVE) ×1
GLOVE BIOGEL PI INDICATOR 8 (GLOVE) ×1
GOWN PREVENTION PLUS XLARGE (GOWN DISPOSABLE) ×2 IMPLANT
GOWN STRL NON-REIN LRG LVL3 (GOWN DISPOSABLE) ×4 IMPLANT
GUIDEWIRE BLUNT VIPER II 1.45 (WIRE) ×2 IMPLANT
GUIDEWIRE SHARP VIPER II (WIRE) ×8 IMPLANT
IV CATH 14GX2 1/4 (CATHETERS) ×2 IMPLANT
KIT BASIN OR (CUSTOM PROCEDURE TRAY) ×2 IMPLANT
KIT POSITION SURG JACKSON T1 (MISCELLANEOUS) ×2 IMPLANT
KIT ROOM TURNOVER OR (KITS) ×2 IMPLANT
MARKER SKIN DUAL TIP RULER LAB (MISCELLANEOUS) ×2 IMPLANT
NDL HYPO 25GX1X1/2 BEV (NEEDLE) ×1 IMPLANT
NDL JAMSHIDI VIPER (NEEDLE) IMPLANT
NDL SPNL 18GX3.5 QUINCKE PK (NEEDLE) ×2 IMPLANT
NEEDLE 22X1 1/2 (OR ONLY) (NEEDLE) ×1 IMPLANT
NEEDLE BONE MARROW 8GX6 FENEST (NEEDLE) IMPLANT
NEEDLE HYPO 25GX1X1/2 BEV (NEEDLE) ×2 IMPLANT
NEEDLE JAMSHIDI VIPER (NEEDLE) ×4 IMPLANT
NEEDLE SPNL 18GX3.5 QUINCKE PK (NEEDLE) ×4 IMPLANT
NS IRRIG 1000ML POUR BTL (IV SOLUTION) ×2 IMPLANT
OIL CARTRIDGE MAESTRO DRILL (MISCELLANEOUS) ×2
PACK LAMINECTOMY ORTHO (CUSTOM PROCEDURE TRAY) ×2 IMPLANT
PACK UNIVERSAL I (CUSTOM PROCEDURE TRAY) ×2 IMPLANT
PAD ARMBOARD 7.5X6 YLW CONV (MISCELLANEOUS) ×4 IMPLANT
PATTIES SURGICAL .5 X1 (DISPOSABLE) ×2 IMPLANT
PATTIES SURGICAL .5 X3 (DISPOSABLE) ×2 IMPLANT
PATTIES SURGICAL .5X1.5 (GAUZE/BANDAGES/DRESSINGS) ×2 IMPLANT
PATTIES SURGICAL .75X.75 (GAUZE/BANDAGES/DRESSINGS) ×2 IMPLANT
ROD VIPER LORDOSED 90MM SPINE (Rod) ×2 IMPLANT
SCREW POLY XTAB 5X40MM (Screw) ×1 IMPLANT
SCREW SET SINGLE INNER MIS (Screw) ×8 IMPLANT
SCREW VIPER 2 XTAB POLY 5X45 (Screw) ×1 IMPLANT
SCREW VIPER X-TAB 6.0X40 (Screw) ×1 IMPLANT
SCREW XTAB POLY VIPER  6X45 (Screw) ×2 IMPLANT
SCREW XTAB POLY VIPER  7X45 (Screw) ×3 IMPLANT
SCREW XTAB POLY VIPER 6X45 (Screw) IMPLANT
SCREW XTAB POLY VIPER 7X45 (Screw) IMPLANT
SPONGE GAUZE 4X4 12PLY (GAUZE/BANDAGES/DRESSINGS) ×2 IMPLANT
SPONGE INTESTINAL PEANUT (DISPOSABLE) ×2 IMPLANT
SPONGE SURGIFOAM ABS GEL 100 (HEMOSTASIS) ×1 IMPLANT
STRIP CLOSURE SKIN 1/2X4 (GAUZE/BANDAGES/DRESSINGS) ×1 IMPLANT
SURGIFLO TRUKIT (HEMOSTASIS) IMPLANT
SUT MNCRL AB 4-0 PS2 18 (SUTURE) ×4 IMPLANT
SUT PROLENE 6 0 C 1 24 (SUTURE) IMPLANT
SUT VIC AB 0 CT1 18XCR BRD 8 (SUTURE) ×2 IMPLANT
SUT VIC AB 0 CT1 8-18 (SUTURE) ×4
SUT VIC AB 1 CT1 18XCR BRD 8 (SUTURE) ×2 IMPLANT
SUT VIC AB 1 CT1 8-18 (SUTURE) ×4
SUT VIC AB 2-0 CT2 18 VCP726D (SUTURE) ×4 IMPLANT
SYR 20CC LL (SYRINGE) ×2 IMPLANT
SYR BULB IRRIGATION 50ML (SYRINGE) ×2 IMPLANT
SYR CONTROL 10ML LL (SYRINGE) ×3 IMPLANT
TAP CANN VIPER2 DL 5.0 (TAP) ×2 IMPLANT
TAP CANN VIPER2 DL 6.0 (TAP) ×1 IMPLANT
TAP CANN VIPER2 DL 7.0 (TAP) ×1 IMPLANT
TAP VIPER MIS 4.35MM (TAP) ×1 IMPLANT
TAPE CLOTH SURG 6X10 WHT LF (GAUZE/BANDAGES/DRESSINGS) ×1 IMPLANT
TOWEL OR 17X24 6PK STRL BLUE (TOWEL DISPOSABLE) ×2 IMPLANT
TOWEL OR 17X26 10 PK STRL BLUE (TOWEL DISPOSABLE) ×2 IMPLANT
TRAY FOLEY CATH 14FR (SET/KITS/TRAYS/PACK) ×2 IMPLANT
WATER STERILE IRR 1000ML POUR (IV SOLUTION) ×2 IMPLANT
YANKAUER SUCT BULB TIP NO VENT (SUCTIONS) ×2 IMPLANT

## 2012-05-07 NOTE — Preoperative (Signed)
Beta Blockers   Reason not to administer Beta Blockers:Not Applicable 

## 2012-05-07 NOTE — Progress Notes (Signed)
Patient disconnected from PCA for surgery for transport.  Pump cleared and 12mg  wasted in Pyxis with witness by Seward Speck RN.

## 2012-05-07 NOTE — Transfer of Care (Signed)
Immediate Anesthesia Transfer of Care Note  Patient: Jasmine Mclaughlin  Procedure(s) Performed: Procedure(s) with comments: POSTERIOR LUMBAR FUSION 3 LEVEL (N/A) - Posterior spinal fusion, lumbar 2-3, lumbar 3-4, lumbar 4-5 with instrumentation.  Patient Location: PACU  Anesthesia Type:General  Level of Consciousness: awake, alert , oriented and patient cooperative  Airway & Oxygen Therapy: Patient Spontanous Breathing and Patient connected to face mask oxygen  Post-op Assessment: Report given to PACU RN, Post -op Vital signs reviewed and stable and Patient moving all extremities X 4  Post vital signs: Reviewed and stable  Complications: No apparent anesthesia complications

## 2012-05-07 NOTE — Anesthesia Procedure Notes (Signed)
Procedure Name: Intubation Date/Time: 05/07/2012 7:42 AM Performed by: Wray Kearns A Pre-anesthesia Checklist: Patient identified, Timeout performed, Emergency Drugs available, Suction available and Patient being monitored Patient Re-evaluated:Patient Re-evaluated prior to inductionOxygen Delivery Method: Circle system utilized Preoxygenation: Pre-oxygenation with 100% oxygen Intubation Type: IV induction and Cricoid Pressure applied Ventilation: Mask ventilation without difficulty Laryngoscope Size: Mac and 4 Grade View: Grade I Tube type: Oral Tube size: 8.0 mm Number of attempts: 1 Airway Equipment and Method: Stylet Placement Confirmation: ETT inserted through vocal cords under direct vision,  breath sounds checked- equal and bilateral and positive ETCO2 Secured at: 21 cm Tube secured with: Tape Dental Injury: Teeth and Oropharynx as per pre-operative assessment

## 2012-05-07 NOTE — Anesthesia Preprocedure Evaluation (Signed)
Anesthesia Evaluation  Patient identified by MRN, date of birth, ID band Patient awake    Reviewed: Allergy & Precautions, H&P , NPO status , Patient's Chart, lab work & pertinent test results  Airway Mallampati: II  Neck ROM: full    Dental   Pulmonary shortness of breath, asthma , Current Smoker,          Cardiovascular     Neuro/Psych    GI/Hepatic   Endo/Other  obese  Renal/GU      Musculoskeletal  (+) Arthritis -,   Abdominal   Peds  Hematology   Anesthesia Other Findings   Reproductive/Obstetrics                           Anesthesia Physical Anesthesia Plan  ASA: II  Anesthesia Plan: General   Post-op Pain Management:    Induction: Intravenous  Airway Management Planned: Oral ETT  Additional Equipment:   Intra-op Plan:   Post-operative Plan: Extubation in OR  Informed Consent: I have reviewed the patients History and Physical, chart, labs and discussed the procedure including the risks, benefits and alternatives for the proposed anesthesia with the patient or authorized representative who has indicated his/her understanding and acceptance.     Plan Discussed with: CRNA, Anesthesiologist and Surgeon  Anesthesia Plan Comments:         Anesthesia Quick Evaluation

## 2012-05-07 NOTE — Anesthesia Postprocedure Evaluation (Signed)
  Anesthesia Post-op Note  Patient: Jasmine Mclaughlin  Procedure(s) Performed: Procedure(s) with comments: POSTERIOR LUMBAR FUSION 3 LEVEL (N/A) - Posterior spinal fusion, lumbar 2-3, lumbar 3-4, lumbar 4-5 with instrumentation.  Patient Location: PACU  Anesthesia Type:General  Level of Consciousness: awake, alert  and oriented  Airway and Oxygen Therapy: Patient Spontanous Breathing and Patient connected to nasal cannula oxygen  Post-op Pain: mild  Post-op Assessment: Post-op Vital signs reviewed, Patient's Cardiovascular Status Stable, Respiratory Function Stable, Patent Airway and Pain level controlled  Post-op Vital Signs: stable  Complications: No apparent anesthesia complications

## 2012-05-07 NOTE — Op Note (Signed)
Jasmine Mclaughlin, Jasmine Mclaughlin                   ACCOUNT NO.:  [id number]  MEDICAL RECORD NO.:  [id number]  LOCATION:  [id number]                        FACILITY:  [facility]  PHYSICIAN:  Estill Bamberg, MD      DATE OF BIRTH:  [birth year]  DATE OF PROCEDURE:  05/06/2012                              OPERATIVE REPORT   PREOPERATIVE DIAGNOSES: 1. Degenerative lumbar scoliosis. 2. Severe neural foraminal stenosis on the right side at L4-5.  POSTOPERATIVE DIAGNOSES: 1. Degenerative lumbar scoliosis. 2. Severe neural foraminal stenosis on the right side at L4-5.  PROCEDURES (STAGE 1 OF 2) 1. Anterior lumbar interbody fusion via a left-sided lateral approach     L2-3, L3-4, L4-5. 2. Insertion of interbody device x3. 3. Use of morselized allograft. 4. Intraoperative use of fluoroscopy.  SURGEON:  Estill Bamberg, MD  ASSISTANT:  Jason Coop, PA-C  ANESTHESIA:  General endotracheal anesthesia.  COMPLICATIONS:  None.  DISPOSITION:  Stable.  ESTIMATED BLOOD LOSS:  Minimal.  IMPLANTS USED:  CoRoent XL PEEK spacers (8 x 18 x 50 at L2-3; 8 x 18 x 50 at L3-4, 10 x 18 x 50 at L4-5).  INDICATIONS FOR PROCEDURE:  Briefly, Jasmine Mclaughlin is an extremely pleasant 62- year-old female who did present to me with severe pain in her right leg. An MRI of the patient's lumbar spine was clearly notable for prominent degenerative disk disease at L2-3, L3-4, and L4-5 in addition to severe right-sided neural foraminal stenosis at L4-5.  The patient and myself did have a discussion regarding the various treatment options to help her condition.  She did have extensive conservative care in the form of physical therapy, injections, and medications.  However, she continued to have severe pain.  We therefore did have a discussion regarding going forward with a lateral interbody fusion from the left side at L2-3, L3- 4, and L4-5 with interbody spacers and allograft.  The patient did understand that this would be followed by second  stage of the procedure the following day.  Specifically, she did understand that this would involve an instrumented L2-L5 posterior spinal fusion and potentially a decompression.  The patient fully understood the risks and limitations of the procedure as outlined in my preoperative note.  OPERATIVE DETAILS:  On May 06, 2012, the patient was brought to surgery and general endotracheal anesthesia was administered.  The patient was placed in the lateral decubitus position with the left side up.  The hips and knees were flexed, and the patient was secured to the bed.  An axillary roll was placed under the patient's right axilla. Neural monitoring leads were placed by me.  The region of the left leg was then prepped and draped in the usual sterile fashion.  I then angled the bed, so as to increase the space between the patient's ribs and iliac crest on the left side.  I then made an incision overlying the L4- 5 interspace directly laterally.  The external/internal oblique musculature was bluntly dissected away, as was the transversalis fascia. The retroperitoneal space was readily encountered.  The peritoneum was bluntly swept anteriorly.  Psoas muscle was readily identified.  I did place the initial dilator directly  through the psoas muscle overlying the L4-5 interspace.  I did use neural monitoring while placing the guidewire to ensure that it was not immediately adjacent to any of the nerves associated with a lumbar plexus.  Of note, the patient's lumbar plexus was noted to be rather anterior, which did necessitate anterior placement of the interbody spacer at this level, as more posterior placement would have risked injury to the lumbar nerves. Once the appropriate position was identified, I did place 2 additional dilators.  The dilators were also tested with neurologic monitoring well advancing them.  Self- retaining retractor was placed over the widest dilator and was attached to the  bed.  I then gently distracted the self-retaining retractor.  I then used a ball-tip monopolar probe to explore the visualized space to ensure that there was no neurologic structures in the visual field.  I then placed the posterior shin through the L4-5 intervertebral space.  I then went forward with a standard diskectomy using a series of curettes and pituitary rongeurs and rasp.  I was very pleased with the final diskectomy.  I did ensure a contralateral annular release.  I then placed a series of trials and did feel that 10 x 18 x 15 mm trial will be the most appropriate fit.  This was packed with DBX mix and tamped into position in the usual fashion.  I was very pleased with the final press-fit of the implant.  The shin was then removed and retraction was relaxed with the distractor and distractor was then removed.  The wound was then copiously irrigated.  I then made a separate incision midway between the L2-3 and L3-4 interspaces, just above the first incision. Once again, the retroperitoneal space was entered in the manner described previously.  Again, the initial dilator was docked over the L3- 4 interspace and there was sequential dilation performed.  I did test each of the dilators to ensure that there was no neurologic structures medially adjacent to the dilator.  I then went forward with an additional diskectomy in the manner described previously.  I was very happy with the final diskectomy.  I then placed a series of trials and at this particular level, I did feel that an 8 x 18 x 50 mm implant would be the most appropriate fit.  This was packed with DBX mix and tamped into position in the usual fashion.  Using the same fascial incision, I then docked over the L2-3 interspace in the manner described previously.  Retractor was advanced over the dilators as previously discussed.  Once again, I went forward with a thorough diskectomy and at this particular level, an 8 x 18 x 50  mm implant was packed with DBX mix and tamped into position in the usual fashion.  I was very pleased with the final press-fit of each of the implants.  The retractors were then removed.  Of note, upon removing the retractor at each of the 3 interspaces, I did explore retroperitoneal space for any undue bleeding and there was none.  I did liberally use AP and lateral fluoroscopic imaging throughout the procedure.  Also of note, there was no abnormal EMG activity encountered throughout the entirety of the procedure.  The fascial incisions were then closed using #1 Vicryl.  The superficial and subcutaneous layer was closed using 2-0 Vicryl and the skin was closed using 3-0 Monocryl.  All instrument counts were correct at the termination of the procedure.  Of note, Jason Coop was my  assistant throughout the entirety of the procedure and aided in essential retraction and suctioning required throughout the surgery.     Estill Bamberg, MD     MD/MEDQ  D:  05/06/2012  T:  05/07/2012  Job:  [id number]  cc:   Lelon Perla, DO

## 2012-05-07 NOTE — Progress Notes (Signed)
Patient doing well. Right leg pain resolved. Will proceed with PSF and instrumentation L2-5.

## 2012-05-08 ENCOUNTER — Encounter (HOSPITAL_COMMUNITY): Payer: Self-pay | Admitting: General Practice

## 2012-05-08 MED ORDER — OXYCODONE HCL ER 10 MG PO T12A
10.0000 mg | EXTENDED_RELEASE_TABLET | Freq: Two times a day (BID) | ORAL | Status: DC
  Administered 2012-05-08 – 2012-05-11 (×7): 10 mg via ORAL
  Filled 2012-05-08 (×7): qty 1

## 2012-05-08 NOTE — Progress Notes (Signed)
PT is recommending home with HH and not SNF. CSW will make CM aware. Clinical Social Worker will sign off for now as social work intervention is no longer needed. Please consult us again if new need arises.   Josselin Gaulin, MSW [phone] 

## 2012-05-08 NOTE — Evaluation (Signed)
Occupational Therapy Evaluation Patient Details Name: Jasmine Mclaughlin MRN: [id number] DOB: 10-Aug-1950 Today's Date: 05/08/2012 Time: 4098-1191 OT Time Calculation (min): 30 min  OT Assessment / Plan / Recommendation Clinical Impression    Pt. underwent 2 part surgery on 4/30 and 5/1 for 3 Level PLIF . Pt at overall Mod A for LB ADLs.  She was able to tolerate standing approximately 7 minutes for initial brace application and she knew step by step instructions for donning of brace. Pt will benefit from acute OT to address below issues.      OT Assessment  Patient needs continued OT Services    Follow Up Recommendations  Home health OT;Supervision/Assistance - 24 hour    Barriers to Discharge None    Equipment Recommendations  3 in 1 bedside comode (TBD)    Recommendations for Other Services    Frequency  Min 2X/week    Precautions / Restrictions Precautions Precautions: Back Precaution Booklet Issued: Yes (comment) Precaution Comments: educated pt. on bacl precautions and log rolling Required Braces or Orthoses: Spinal Brace Spinal Brace: Thoracolumbosacral orthotic;Other (comment) (with right Le attachment) Spinal Brace Comments: ordered to be applied OOB Restrictions Weight Bearing Restrictions: No   Pertinent Vitals/Pain 10/10 back pain. Repositioned, Nurse made aware. Pt became nauseous during session and nurse notified for meds.     ADL  Eating/Feeding: Independent Where Assessed - Eating/Feeding: Chair Grooming: Set up Where Assessed - Grooming: Supported sitting Upper Body Bathing: Set up;Supervision/safety Where Assessed - Upper Body Bathing: Supported sitting Lower Body Bathing: Moderate assistance Where Assessed - Lower Body Bathing: Supported sit to stand Upper Body Dressing: Set up;Supervision/safety Where Assessed - Upper Body Dressing: Supported sitting Lower Body Dressing: Moderate assistance Where Assessed - Lower Body Dressing: Supported sit to  Pharmacist, hospital: +2 Total assistance Toilet Transfer: Patient Percentage: 70% Toilet Transfer Method: Sit to Barista: Comfort height toilet;Other (comment) (from bed) Toileting - Clothing Manipulation and Hygiene: Minimal assistance Where Assessed - Toileting Clothing Manipulation and Hygiene: Sit to stand from 3-in-1 or toilet Tub/Shower Transfer Method: Not assessed Equipment Used: Gait belt;Rolling walker;Back brace ADL Comments: Pt at overall Mod A level for LB ADLs.     OT Diagnosis: Acute pain  OT Problem List: Decreased activity tolerance;Decreased knowledge of use of DME or AE;Decreased knowledge of precautions;Pain OT Treatment Interventions: Self-care/ADL training;DME and/or AE instruction;Therapeutic activities;Patient/family education   OT Goals Acute Rehab OT Goals OT Goal Formulation: With patient Time For Goal Achievement: 05/15/12 Potential to Achieve Goals: Good ADL Goals Pt Will Perform Lower Body Bathing: with supervision;Sit to stand from chair ADL Goal: Lower Body Bathing - Progress: Goal set today Pt Will Perform Lower Body Dressing: with supervision;Sit to stand from bed;Sit to stand from chair ADL Goal: Lower Body Dressing - Progress: Goal set today Pt Will Transfer to Toilet: with supervision;Ambulation;with DME ADL Goal: Toilet Transfer - Progress: Goal set today Pt Will Perform Toileting - Clothing Manipulation: with supervision;Standing ADL Goal: Toileting - Clothing Manipulation - Progress: Goal set today Pt Will Perform Toileting - Hygiene: with supervision;Sit to stand from 3-in-1/toilet;Sitting on 3-in-1 or toilet ADL Goal: Toileting - Hygiene - Progress: Goal set today Pt Will Perform Tub/Shower Transfer: Tub transfer;Ambulation;with DME;with supervision ADL Goal: Tub/Shower Transfer - Progress: Goal set today Miscellaneous OT Goals Miscellaneous OT Goal #1: Pt will independently verbalize and demonstrate 3/3 back  precautions.  OT Goal: Miscellaneous Goal #1 - Progress: Goal set today Miscellaneous OT Goal #2: Caregiver will be independent in  donning/doffing brace. OT Goal: Miscellaneous Goal #2 - Progress: Goal set today  Visit Information  Last OT Received On: 05/08/12 Assistance Needed: +2 PT/OT Co-Evaluation/Treatment: Yes    Subjective Data      Prior Functioning     Home Living Lives With: Daughter Available Help at Discharge: Family;Available 24 hours/day Type of Home: House Home Access: Stairs to enter [employer] of Steps: 3 Entrance Stairs-Rails: None Home Layout: Two level;Laundry or work area in basement [employer] Shower/Tub: Forensic scientist: Pharmacist, community: Yes How Accessible: Accessible via walker Home Adaptive Equipment: Hand-held shower hose Prior Function Level of Independence: Independent Able to Take Stairs?: Yes Driving: Yes Vocation: Full time employment Communication Communication: No difficulties Dominant Hand: Left         Vision/Perception     Cognition  Cognition Arousal/Alertness: Awake/alert Behavior During Therapy: WFL for tasks assessed/performed Overall Cognitive Status: Within Functional Limits for tasks assessed    Extremity/Trunk Assessment Right Upper Extremity Assessment RUE ROM/Strength/Tone: WFL for tasks assessed Left Upper Extremity Assessment LUE ROM/Strength/Tone: WFL for tasks assessed     Mobility Bed Mobility Bed Mobility: Rolling Left;Left Sidelying to Sit Rolling Left: 4: Min assist Left Sidelying to Sit: 3: Mod assist Details for Bed Mobility Assistance: cues for technique and log rolling Transfers Transfers: Sit to Stand;Stand to Sit Sit to Stand: 1: +2 Total assist;From bed;With upper extremity assist Sit to Stand: Patient Percentage: 70% Stand to Sit: 1: +2 Total assist;With armrests;To chair/3-in-1 Stand to Sit: Patient Percentage: 70% Details for Transfer  Assistance: cues for hand placement and technique.             End of Session OT - End of Session Equipment Utilized During Treatment: Gait belt;Back brace (rolling walker) Activity Tolerance: Patient limited by pain Patient left: in chair;with call bell/phone within reach Nurse Communication: Mobility status;Other (comment) (nausea meds)  GO     Earlie Raveling OTR/L [phone] 05/08/2012, 3:26 PM

## 2012-05-08 NOTE — Progress Notes (Signed)
Patient doing well overnight. + LBP. No leg pain.  BP 131/54  Pulse 108  Temp(Src) 100.3 F (37.9 C) (Oral)  Resp 18  Ht 5\' 3"  (1.6 m)  Wt 90.719 kg (200 lb)  BMI 35.44 kg/m2  SpO2 96%  NVI Dressings CDI  S/p 2-5 XLIF and 2-5 PSF  - up with PT/OT today with brace - back precautions - d/c pca, start oxycontin and percocet and valium

## 2012-05-08 NOTE — Op Note (Signed)
Jasmine, Mclaughlin                   ACCOUNT NO.:  [id number]  MEDICAL RECORD NO.:  [id number]  LOCATION:  5N30C                        FACILITY:  [facility]  PHYSICIAN:  Estill Bamberg, MD      DATE OF BIRTH:  1950/06/01  DATE OF PROCEDURE:  05/07/2012                              OPERATIVE REPORT   PREOPERATIVE DIAGNOSIS: 1. Severe right-sided leg pain. 2. Significant lumbar degenerative scoliosis. 3. Status post lumbar L2 to interbody fusions pending stage II of the     patient procedure today.  POSTOPERATIVE DIAGNOSIS: 1. Severe right-sided leg pain. 2. Significant lumbar degenerative scoliosis. 3. Status post lumbar L2 to interbody fusions pending stage II of the     patient procedure today.  PROCEDURE (Stage 2 of 2) 1. Placement of posterior instrumentation L2, L3, L4, L5, bilateral. 2. Posterior spinal fusion, L2-3, L3-4, L4-5. 3. Use of local autograft. 4. Intraoperative use of fluoroscopy.  SURGEON:  Estill Bamberg, MD  ASSISTANT:  Jason Coop, PA-C  ANESTHESIA:  General endotracheal anesthesia.  COMPLICATIONS:  None.  DISPOSITION:  Stable.  ESTIMATED BLOOD LOSS:  100 mL.  INDICATIONS FOR PROCEDURE:  Briefly, [name] is a very pleasant 62 year old female, who did present to me with severe and debilitating pain in her right leg.  I would refer to my operative report of stage I of her procedure for full account of the patient's pathology and preoperative course.  Given the patient's pathology, the plan was to go forward with a lateral interbody fusion from L2 to L5 yesterday.  The plan was then to take the patient back for stage II of her procedure, which was to involve a instrumented posterior spinal fusion from L2 to L5.  The patient fully understood the risks and limitations of the procedure as outlined in my preoperative note.  OPERATIVE DETAILS:  On May 07, 2012, one day after the patient's stage I of her procedure, the patient was brought to surgery and  general endotracheal anesthesia was again administered.  The patient was placed prone on a well-padded Jackson spinal frame.  All bony prominences were meticulously padded.  The back was prepped and draped in the usual sterile fashion.  Antibiotics were given.  A time-out procedure was performed.  I then marked out the pedicles of L2, L3, L4, and L5 on the right and the left sides.  I then made a paramedian incisions approximately 2 cm lateral to the pedicles on both the right and the left sides.  I then advanced Jamshidi needles through the L2, L3, L4, and L5 pedicles on both the right and the left sides.  Guidewires were placed through the needles.  Of particular note, the L2 pedicles were very small, which was readily notable on preoperative templating.  I, therefore, did use a 4 mm tap at the L2 pedicles bilaterally.  I used a 5 mm tap at L3 bilaterally and a 6-mm tap at L4 and L5.  Of particular note, upon attempting to advanced a 6-mm tap across the L5 pedicle on the right side, the bone was noted to be extremely sclerotic.  I, therefore, did decide to use a 5 mm tap  and working my way up to a 6-mm tap.  Upon advancement of the tap, the threaded portion of the tap did fracture and disconnected from the portion of the tap attached to the handle.  I did make a 2nd incision just lateral to the first incision in order to gain better access to the fracture portion of the tap.  I did make various attempts using a broken instrumentation set to remove the tap without success.  At this point, I did place a tubular retractors overlying the fracture portion of the tap.  I did use a Strathpine to remove bone surrounding the tap.  I then used a female broken instrumentation remover and the tap was uneventfully removed.  Through the same portal, I did again used a 5 and 6-mm tap uneventfully.  The guidewire was placed.  Using the paramedian incisions, I did remove the facet joints on the right  side at the L2-3, L3-4, and L4-5 levels.  I did use a high-speed bur to decorticate the facet joints as well as the transverse processes.  There were significant and abundant osteophytes noted particularly associated with the L4-5 facet joint.  These osteophytes were removed and used for autograft to help aid in a successful posterolateral fusion.  At this point, the right and left paramedian wounds were copiously irrigated.  I then placed screws of the appropriate length over the previously placed guidewires.  A 5 mm screws were placed at L2, 6 mm screws were placed at L3, a 7 mm screw was placed on the left at L4, well a 6 mm screws placed on the right at L4, and 7 mm screws were placed at L5.  Of particular note, I did use neurologic monitoring throughout the procedure and there was no abnormal EMG activity noted throughout the surgery.  I did also use triggered EMG to test each of the screws.  There no screw that tested below 10 milliamps.  Once the screws were placed, a 90 mm rods were placed subfascially and caps were placed over the rods.  I did apply compression on the left side across the L4-5 interspace to help neutralize the segmental angulation.  Final locking was confirmed at each of the pedicle screws.  I very pleased with the final appearance of the radiograph.  The wound was copiously irrigated.  I did use AP and lateral fluoroscopy throughout the procedure.  I did again obtain AP and lateral fluoroscopic views at the completion of the procedure.  I was very pleased with the final appearance of the construct.  The tower was attached to the screws were removed.  The fascia was then closed using #1 Vicryl.  The subcutaneous layers closed using 2-0 Vicryl.  The skin was closed using 3-0 Monocryl.  Benzoin and Steri-Strips were applied followed by sterile dressing.  All instrument counts were correct at the termination of the procedure.  Of note, Jason Coop was my  assistant throughout the entirety of the procedure and aided in essential retraction and suctioning required throughout the surgery.     Estill Bamberg, MD     MD/MEDQ  D:  05/07/2012  T:  05/08/2012  Job:  [id number]

## 2012-05-08 NOTE — Progress Notes (Signed)
[facility]  Patient Status: New  [facility] is providing the following services: PT - note plan for possible weekend discharge.  Is on the schedule For a PT visit at home on Monday. Thank you.  If patient discharges after hours, please call [phone].   Jodene Nam 05/08/2012, 4:51 PM

## 2012-05-08 NOTE — Evaluation (Signed)
Physical Therapy Evaluation Patient Details Name: Jasmine Mclaughlin MRN: [id number] DOB: 03-09-1950 Today's Date: 05/08/2012 Time: 1478-2956 PT Time Calculation (min): 31 min  PT Assessment / Plan / Recommendation Clinical Impression  Pt. underwent 2 part surgery on 4/30 and 5/1 for 3  Level PLIF .  she presents to PT with anticipated decreases in functional mobility and gait , pain and difficulty in application of TLSO with right leg attachment.  She was able to tolerate standing approximately 7 minutes for initial brace application and she knew step by step instructions for donning of brace.  she wiil benefit from acute PT to address mobility and below issues.      PT Assessment  Patient needs continued PT services    Follow Up Recommendations  Home health PT;Supervision/Assistance - 24 hour;Supervision for mobility/OOB    Does the patient have the potential to tolerate intense rehabilitation      Barriers to Discharge None      Equipment Recommendations  Rolling walker with 5" wheels    Recommendations for Other Services     Frequency 7X/week    Precautions / Restrictions Precautions Precautions: Back Precaution Booklet Issued: Yes (comment) Precaution Comments: educated pt. on bacl precautions and log rolling Required Braces or Orthoses: Spinal Brace Spinal Brace: Thoracolumbosacral orthotic;Other (comment) (with right Le attachment) Spinal Brace Comments: ordered to be applied OOB Restrictions Weight Bearing Restrictions: No   Pertinent Vitals/Pain See vitals tab       Mobility  Bed Mobility Bed Mobility: Rolling Left;Left Sidelying to Sit Rolling Left: 4: Min assist Left Sidelying to Sit: 3: Mod assist Details for Bed Mobility Assistance: cues for technique and log rolling Transfers Transfers: Sit to Stand;Stand to Sit Sit to Stand: 1: +2 Total assist;From bed;With upper extremity assist Sit to Stand: Patient Percentage: 70% Stand to Sit: 1: +2 Total assist;With  armrests;To chair/3-in-1 Stand to Sit: Patient Percentage: 70% Details for Transfer Assistance: cues for hand placement and technique.   Ambulation/Gait Ambulation/Gait Assistance: 1: +2 Total assist Ambulation/Gait: Patient Percentage: 70% Ambulation Distance (Feet): 2 Feet Assistive device: Rolling walker Ambulation/Gait Assistance Details: assist for moving RW and for safety/stability Gait Pattern: Step-to pattern Gait velocity: decreased    Exercises     PT Diagnosis: Difficulty walking;Abnormality of gait;Acute pain  PT Problem List: Decreased activity tolerance;Decreased mobility;Decreased knowledge of use of DME;Decreased knowledge of precautions;Pain PT Treatment Interventions: DME instruction;Gait training;Stair training;Functional mobility training;Therapeutic activities;Patient/family education   PT Goals Acute Rehab PT Goals PT Goal Formulation: With patient Time For Goal Achievement: 05/15/12 Potential to Achieve Goals: Good Pt will go Supine/Side to Sit: with modified independence PT Goal: Supine/Side to Sit - Progress: Goal set today Pt will go Sit to Supine/Side: with modified independence PT Goal: Sit to Supine/Side - Progress: Goal set today Pt will go Sit to Stand: with modified independence PT Goal: Sit to Stand - Progress: Goal set today Pt will go Stand to Sit: with modified independence PT Goal: Stand to Sit - Progress: Goal set today Pt will Ambulate: 51 - 150 feet;with rolling walker;with modified independence PT Goal: Ambulate - Progress: Goal set today Pt will Go Up / Down Stairs: 3-5 stairs;with mod assist;with least restrictive assistive device PT Goal: Up/Down Stairs - Progress: Goal set today Additional Goals Additional Goal #1: pt. will state and adhere to 3/3 back precautions and log rolling technique PT Goal: Additional Goal #1 - Progress: Goal set today  Visit Information  Last PT Received On: 05/08/12 Assistance Needed: +2  PT/OT  Co-Evaluation/Treatment: Yes    Subjective Data  Subjective: Had surgery 2 days in a row Patient Stated Goal: return to work and be able to function at home after work   Prior [employer] Living Lives With: Daughter Available Help at Discharge: Family;Available 24 hours/day Type of Home: House Home Access: Stairs to enter [employer] of Steps: 3 Entrance Stairs-Rails: None Home Layout: Two level;Laundry or work area in basement [employer] Shower/Tub: Forensic scientist: Pharmacist, community: Yes How Accessible: Accessible via walker Home Adaptive Equipment: Hand-held shower hose Prior Function Level of Independence: Independent Able to Take Stairs?: Yes Driving: Yes Vocation: Full time employment Communication Communication: No difficulties Dominant Hand: Left    Cognition  Cognition Arousal/Alertness: Awake/alert Behavior During Therapy: WFL for tasks assessed/performed Overall Cognitive Status: Within Functional Limits for tasks assessed    Extremity/Trunk Assessment Right Upper Extremity Assessment RUE ROM/Strength/Tone: [facility] for tasks assessed Left Upper Extremity Assessment LUE ROM/Strength/Tone: WFL for tasks assessed Right Lower Extremity Assessment RLE ROM/Strength/Tone: WFL for tasks assessed RLE Sensation: WFL - Light Touch Left Lower Extremity Assessment LLE ROM/Strength/Tone: WFL for tasks assessed LLE Sensation: WFL - Light Touch Trunk Assessment Trunk Assessment: Normal   Balance Balance Balance Assessed: Yes Static Sitting Balance Static Sitting - Balance Support: No upper extremity supported;Feet supported Static Sitting - Level of Assistance: 5: Stand by assistance Static Standing Balance Static Standing - Balance Support: Bilateral upper extremity supported;During functional activity (during application of TLSO with R LE attachment) Static Standing - Level of Assistance: 4: Min assist Static  Standing - Comment/# of Minutes: 7  End of Session PT - End of Session Equipment Utilized During Treatment: Gait belt;Back brace Activity Tolerance: Patient limited by fatigue;Patient limited by pain Patient left: in chair;with call bell/phone within reach Nurse Communication: Mobility status;Patient requests pain meds  GP     Ferman Hamming 05/08/2012, 1:24 PM Weldon Picking PT Acute Rehab Services [phone] Beeper [phone]

## 2012-05-09 NOTE — Care Management Note (Signed)
05/09/12 6:10pm Magnus Ivan, RN, BSN, CCM CM consult for home health physical therapy with 24 hour supervison, occupational therapy, DME-rolling walker and 3 N 1. Patient set up with [facility] for services. Advised that DME would be delivered to her room upon discharge.  Patient states, she will be staying with her daughter Tammi Klippel who will provide 24 hour supervision. Patient will reside at [address]  [place]. [phone]. Patients cell-[phone] and daughters home number [phone].

## 2012-05-09 NOTE — Progress Notes (Signed)
Patient doing well overnight. + LBP. Leg pain is resolved. Up with PT yesterday, making steady gains. Pain well controlled on oral meds. Denies Nausea.   BP 124/76  Pulse 64  Temp(Src) 99 F (37.2 C) (Oral)  Resp 18  Ht 5\' 3"  (1.6 m)  Wt 90.719 kg (200 lb)  BMI 35.44 kg/m2  SpO2 95%  NVI, Lateral and posterior dressings CDI, - Homans, 2+ DPP, SCD's in place, neurovascular intact  S/p 2-5 XLIF and 2-5 PSF  - Continue PT/OT today with   - back precautions   - TLSO brace at all times when OOB - Pain meds: continue oxycontin, percocet and valium - D/C home tomorrow vs Monday pending gains in PT  - Pt will require HH - Transition to solid foods, advance diet as tolerated  -D/C liquid diet

## 2012-05-09 NOTE — Progress Notes (Signed)
Physical Therapy Treatment Patient Details Name: Jasmine Mclaughlin MRN: [id number] DOB: 02/06/1950 Today's Date: 05/09/2012 Time: 4098-1191 PT Time Calculation (min): 27 min  PT Assessment / Plan / Recommendation Comments on Treatment Session  Patient is progressing very well. Able to complete family education with daughter. Attempt steps next session    Follow Up Recommendations  Home health PT;Supervision/Assistance - 24 hour;Supervision for mobility/OOB     Does the patient have the potential to tolerate intense rehabilitation     Barriers to Discharge        Equipment Recommendations  Rolling walker with 5" wheels    Recommendations for Other Services    Frequency 7X/week   Plan Discharge plan remains appropriate;Frequency remains appropriate    Precautions / Restrictions Precautions Precautions: Back Precaution Booklet Issued: Yes (comment) Precaution Comments: able to recall 3/3 back precautions Required Braces or Orthoses: Spinal Brace Spinal Brace: Thoracolumbosacral orthotic;Other (comment) Spinal Brace Comments: donned seated eob, adjusted in standing   Pertinent Vitals/Pain no apparent distress     Mobility  Bed Mobility Bed Mobility: Rolling Left;Left Sidelying to Sit;Sitting - Scoot to Edge of Bed Rolling Left: 4: Min guard Left Sidelying to Sit: 4: Min guard;With rails;HOB elevated Sitting - Scoot to Edge of Bed: 4: Min guard Details for Bed Mobility Assistance: cues for safety andtech. for log rolling Transfers Sit to Stand: With upper extremity assist;With armrests;From bed;From toilet;From chair/3-in-1;4: Min assist;4: Min guard Stand to Sit: 4: Min assist;4: Min guard Details for Transfer Assistance: cues for hand placement and tech. Ambulation/Gait Ambulation/Gait Assistance: 4: Min assist Ambulation Distance (Feet): 90 Feet Assistive device: Rolling walker Ambulation/Gait Assistance Details: Cues for posture and safety with RW management Gait Pattern:  Step-through pattern;Decreased stride length;Antalgic Gait velocity: decreased    Exercises     PT Diagnosis:    PT Problem List:   PT Treatment Interventions:     PT Goals Acute Rehab PT Goals PT Goal: Supine/Side to Sit - Progress: Progressing toward goal PT Goal: Sit to Stand - Progress: Progressing toward goal PT Goal: Stand to Sit - Progress: Progressing toward goal PT Goal: Ambulate - Progress: Progressing toward goal Additional Goals PT Goal: Additional Goal #1 - Progress: Progressing toward goal  Visit Information  Last PT Received On: 05/09/12 Assistance Needed: +1    Subjective Data      Cognition  Cognition Arousal/Alertness: Awake/alert Behavior During Therapy: WFL for tasks assessed/performed Overall Cognitive Status: Within Functional Limits for tasks assessed    Balance     End of Session PT - End of Session Equipment Utilized During Treatment: Gait belt;Back brace Activity Tolerance: Patient tolerated treatment well Patient left: in chair;with call bell/phone within reach Nurse Communication: Mobility status   GP     Fredrich Birks 05/09/2012, 11:57 AM 05/09/2012 Fredrich Birks PTA [phone] pager [phone] office

## 2012-05-09 NOTE — Progress Notes (Signed)
Occupational Therapy Treatment Patient Details Name: Jasmine Mclaughlin MRN: [id number] DOB: 1950-07-16 Today's Date: 05/09/2012 Time: 1308-6578 OT Time Calculation (min): 24 min  OT Assessment / Plan / Recommendation Comments on Treatment Session pt. much improved from yesterday.  tolerated increased ambulation (see pt notes) and able to assist with self care tasks.  deciding between tub chair vs. bench would benefit from practice with both    Follow Up Recommendations  Home health OT;Supervision/Assistance - 24 hour           Equipment Recommendations  3 in 1 bedside comode        Frequency Min 2X/week         Precautions / Restrictions Precautions Precautions: Back Precaution Booklet Issued: Yes (comment) Precaution Comments: able to recall 3/3 back precautions Required Braces or Orthoses: Spinal Brace Spinal Brace: Thoracolumbosacral orthotic;Other (comment) Spinal Brace Comments: donned seated eob, adjusted in standing   Pertinent Vitals/Pain C/o knee pain with ambulation    ADL  Grooming: Performed;Brushing hair;Set up Where Assessed - Grooming: Unsupported sitting Lower Body Bathing:  (no a/e dtr. to assist) Lower Body Dressing: Performed;+1 Total assistance (no a/e dtr. to assist) Toilet Transfer: Performed;Minimal assistance (adjust leg portion of TLSO  for sitting position) Toilet Transfer Method: Sit to stand Toilet Transfer Equipment: Regular height toilet;Grab bars Toileting - Clothing Manipulation and Hygiene: Performed;Minimal assistance Where Assessed - Glass blower/designer Manipulation and Hygiene: Standing Equipment Used: Back brace;Rolling walker Transfers/Ambulation Related to ADLs: amb. with rw in room, slow safe ADL Comments: able to provide inst. for safe donning and adjustment of brace, will have assistance with LB b/d at home, looking into shower tub vs. bench. dtr. educated and provided with examples of each.      :     OT Goals ADL Goals ADL Goal:  Lower Body Bathing - Progress: Discontinued (comment) ADL Goal: Lower Body Dressing - Progress: Discontinued (comment) ADL Goal: Toilet Transfer - Progress: Progressing toward goals ADL Goal: Toileting - Clothing Manipulation - Progress: Progressing toward goals ADL Goal: Toileting - Hygiene - Progress: Progressing toward goals Miscellaneous OT Goals OT Goal: Miscellaneous Goal #1 - Progress: Progressing toward goals OT Goal: Miscellaneous Goal #2 - Progress: Progressing toward goals  Visit Information  Last OT Received On: 05/09/12 Assistance Needed: +1    Subjective Data  Subjective: "okay, just let me wake up a little" ( in response to oob with therapy) Patient Stated Goal: home with dtr. assistance          Cognition  Cognition Arousal/Alertness: Awake/alert Behavior During Therapy: WFL for tasks assessed/performed Overall Cognitive Status: Within Functional Limits for tasks assessed    Mobility  Bed Mobility Bed Mobility: Rolling Left;Left Sidelying to Sit;Sitting - Scoot to Edge of Bed Rolling Left: 4: Min guard Left Sidelying to Sit: 4: Min guard;With rails;HOB elevated Sitting - Scoot to Edge of Bed: 4: Min guard Details for Bed Mobility Assistance: cues for safety andtech. for log rolling Transfers Transfers: Sit to Stand;Stand to Sit Sit to Stand: With upper extremity assist;With armrests;From bed;From toilet;From chair/3-in-1;4: Min assist;4: Min guard Stand to Sit: 4: Min assist;4: Min guard Details for Transfer Assistance: cues for hand placement and tech.              End of Session OT - End of Session Equipment Utilized During Treatment: Back brace Activity Tolerance: Patient tolerated treatment well Patient left: in chair;with call bell/phone within reach;with family/visitor present       Robet Leu 05/09/2012,  11:52 AM

## 2012-05-10 NOTE — Progress Notes (Signed)
Occupational Therapy Treatment Patient Details Name: Jasmine Mclaughlin MRN: [id number] DOB: 03-20-1950 Today's Date: 05/10/2012 Time: 7829-5621 OT Time Calculation (min): 33 min  OT Assessment / Plan / Recommendation Comments on Treatment Session  Pt progressing with BADLs.  She needs min - mod verbal cues to avoid bending and twisting during self care activities.  Dtr is independent with assisting pt and with cuing her for safety     Follow Up Recommendations  Home health OT;Supervision/Assistance - 24 hour    Barriers to Discharge       Equipment Recommendations  3 in 1 bedside comode    Recommendations for Other Services    Frequency Min 2X/week   Plan Discharge plan remains appropriate    Precautions / Restrictions Precautions Precautions: Back Precaution Booklet Issued: Yes (comment) Precaution Comments: able to recall 3/3 back precautions Required Braces or Orthoses: Spinal Brace Spinal Brace: Thoracolumbosacral orthotic;Other (comment) Spinal Brace Comments: donned seated eob, adjusted in standing Restrictions Weight Bearing Restrictions: No   Pertinent Vitals/Pain     ADL  Grooming: Wash/dry hands;Wash/dry face;Teeth care;Supervision/safety Where Assessed - Grooming: Supported standing Tub/Shower Transfer: Minimal assistance Web designer Method: Ecologist with back Equipment Used: Back brace;Rolling walker;Reacher Transfers/Ambulation Related to ADLs: supervision ADL Comments: Pt. requires vc's to avoid bending and twisting with grooming at sink and with tub transfer.  Dtr able to assist safely with TLSO and is able to cue pt correctly.  Pt has tub seat with a back taht she can borrow and instructed them on use and acquisition of reacher    OT Diagnosis:    OT Problem List:   OT Treatment Interventions:     OT Goals Acute Rehab OT Goals Time For Goal Achievement: 05/15/12 Potential to Achieve Goals: Good ADL  Goals Pt Will Perform Lower Body Bathing: with supervision;Sit to stand from chair Pt Will Perform Lower Body Dressing: with supervision;Sit to stand from bed;Sit to stand from chair Pt Will Transfer to Toilet: with supervision;Ambulation;with DME Pt Will Perform Toileting - Clothing Manipulation: with supervision;Standing Pt Will Perform Toileting - Hygiene: with supervision;Sit to stand from 3-in-1/toilet;Sitting on 3-in-1 or toilet Pt Will Perform Tub/Shower Transfer: Tub transfer;Ambulation;with DME;with supervision ADL Goal: Tub/Shower Transfer - Progress: Progressing toward goals Miscellaneous OT Goals Miscellaneous OT Goal #1: Pt will independently verbalize and demonstrate 3/3 back precautions.  OT Goal: Miscellaneous Goal #1 - Progress: Progressing toward goals Miscellaneous OT Goal #2: Caregiver will be independent in donning/doffing brace. OT Goal: Miscellaneous Goal #2 - Progress: Progressing toward goals  Visit Information  Last OT Received On: 05/10/12 Assistance Needed: +1    Subjective Data      Prior Functioning       Cognition  Cognition Arousal/Alertness: Awake/alert Behavior During Therapy: WFL for tasks assessed/performed Overall Cognitive Status: Within Functional Limits for tasks assessed    Mobility  Bed Mobility Bed Mobility: Not assessed Transfers Transfers: Sit to Stand;Stand to Sit Sit to Stand: 4: Min assist;From chair/3-in-1;Without upper extremity assist Stand to Sit: 4: Min guard;With upper extremity assist;To chair/3-in-1 Details for Transfer Assistance: Pt requires min A to move sit to stand from tub seat with a back    Exercises      Balance     End of Session OT - End of Session Equipment Utilized During Treatment: Back brace Activity Tolerance: Patient tolerated treatment well Patient left: with family/visitor present Nurse Communication: Mobility status  GO     Valdis Bevill M 05/10/2012, 10:50 AM

## 2012-05-10 NOTE — Progress Notes (Signed)
Physical Therapy Treatment Patient Details Name: Jasmine Mclaughlin MRN: [id number] DOB: Jan 23, 1950 Today's Date: 05/10/2012 Time: 7829-5621 PT Time Calculation (min): 28 min  PT Assessment / Plan / Recommendation Comments on Treatment Session  Pt cont's to make progress with mobility.   Performed stairs this session.  Daughter present entire session & was educated on how to assist with stairs.      Follow Up Recommendations  Home health PT;Supervision/Assistance - 24 hour;Supervision for mobility/OOB     Does the patient have the potential to tolerate intense rehabilitation     Barriers to Discharge        Equipment Recommendations  Rolling walker with 5" wheels    Recommendations for Other Services    Frequency 7X/week   Plan Discharge plan remains appropriate;Frequency remains appropriate    Precautions / Restrictions Precautions Precautions: Back Precaution Booklet Issued: Yes (comment) Precaution Comments: able to recall 3/3 back precautions Required Braces or Orthoses: Spinal Brace Spinal Brace: Thoracolumbosacral orthotic;Other (comment) Spinal Brace Comments: donned seated eob, adjusted in standing Restrictions Weight Bearing Restrictions: No       Mobility  Bed Mobility Bed Mobility: Right Sidelying to Sit;Rolling Right;Sitting - Scoot to Edge of Bed Rolling Right: 5: Supervision;With rail Right Sidelying to Sit: 5: Supervision Details for Bed Mobility Assistance: Cues for sequencing & technique.   Transfers Transfers: Sit to Stand;Stand to Sit Sit to Stand: 4: Min guard;With upper extremity assist;From bed Stand to Sit: 4: Min guard;With upper extremity assist;With armrests;To chair/3-in-1 Details for Transfer Assistance: Pt pulled herself to standing with hands starting on RW.  Encouragement to start with hands on bed & push from seated surface to ensure safety.   Ambulation/Gait Ambulation/Gait Assistance: 4: Min guard Ambulation Distance (Feet): 140  Feet Assistive device: Rolling walker Ambulation/Gait Assistance Details: Pt with slow, cautious gait.  Cues to increase floor clearance with R LE.   Gait Pattern: Step-through pattern;Decreased step length - right;Decreased hip/knee flexion - right Gait velocity: decreased Stairs: Yes Stairs Assistance: 1: +2 Total assist;4: Min assist Stairs Assistance Details (indicate cue type and reason): Pt performed 2 different ways:  1st trial backwards with RW; 2nd trial forwards with bil HHA.  Pt reports she feels more comfortable going forwards with bil HHA.  Daughter present & educated on how to assist.   Stair Management Technique: Step to pattern;Backwards;Forwards;With walker (bil HHA (therapist + pt's daughter)) Number of Stairs: 3 (2x's) Wheelchair Mobility Wheelchair Mobility: No      PT Goals Acute Rehab PT Goals Time For Goal Achievement: 05/15/12 Potential to Achieve Goals: Good Pt will go Supine/Side to Sit: with modified independence PT Goal: Supine/Side to Sit - Progress: Progressing toward goal Pt will go Sit to Supine/Side: with modified independence Pt will go Sit to Stand: with modified independence PT Goal: Sit to Stand - Progress: Progressing toward goal Pt will go Stand to Sit: with modified independence PT Goal: Stand to Sit - Progress: Progressing toward goal Pt will Ambulate: 51 - 150 feet;with rolling walker;with modified independence PT Goal: Ambulate - Progress: Progressing toward goal Pt will Go Up / Down Stairs: 3-5 stairs;with mod assist;with least restrictive assistive device PT Goal: Up/Down Stairs - Progress: Progressing toward goal Additional Goals Additional Goal #1: pt. will state and adhere to 3/3 back precautions and log rolling technique PT Goal: Additional Goal #1 - Progress: Progressing toward goal  Visit Information  Last PT Received On: 05/10/12 Assistance Needed: +1    Subjective Data  Cognition  Cognition Arousal/Alertness:  Awake/alert Behavior During Therapy: WFL for tasks assessed/performed Overall Cognitive Status: Within Functional Limits for tasks assessed    Balance     End of Session PT - End of Session Equipment Utilized During Treatment: Gait belt;Back brace Activity Tolerance: Patient tolerated treatment well Patient left: in chair;with call bell/phone within reach;with family/visitor present Nurse Communication: Mobility status    Verdell Face, [place] [phone] 05/10/2012

## 2012-05-10 NOTE — Progress Notes (Signed)
Patient doing well, + LBP. Leg pain is resolved. Up with PT today and making gains, ambulating much better but still some difficulties with steps. R knee px began today, supra-patellar with squatting, mild and bearable. Pain well controlled on oral meds.   BP 109/69  Pulse 75  Temp(Src) 99 F (37.2 C) (Oral)  Resp 18  Ht 5\' 3"  (1.6 m)  Wt 90.719 kg (200 lb)  BMI 35.44 kg/m2  SpO2 94%  TLSO fitting appropriately, R thigh extension. NVI, Lateral and posterior dressings CDI, - Homans, 2+ DPP, SCD's in place, neurovascular intact. Pain over distal quadriceps with eccentric motions. R knee stable, no laxity in any plane.   S/p 2-5 XLIF and 2-5 PSF   - Continue PT/OT today  - back precautions  - TLSO brace at all times when OOB  - Pain meds: continue oxycontin, percocet and valium  - D/C home tomorrow after PT  - Pt will require HH, has been established  - D/C home on percocet/valium, will discuss need for home oxycontin tomorrow

## 2012-05-10 NOTE — Progress Notes (Signed)
Patient in great spirits. Pain well controlled.  BP 122/55  Pulse 79  Temp(Src) 98.7 F (37.1 C) (Oral)  Resp 20  Ht 5\' 3"  (1.6 m)  Wt 90.719 kg (200 lb)  BMI 35.44 kg/m2  SpO2 99%  NVI Dressing CDI  Plan is to d/c home tomorrow with HHPT F/u my office in 2 weeks

## 2012-05-11 ENCOUNTER — Encounter (HOSPITAL_COMMUNITY): Payer: Self-pay | Admitting: Orthopedic Surgery

## 2012-05-11 MED ORDER — OXYCODONE HCL ER 10 MG PO T12A: 10.0000 mg | EXTENDED_RELEASE_TABLET | Freq: Two times a day (BID) | ORAL | Status: DC

## 2012-05-11 NOTE — Progress Notes (Signed)
Physical Therapy Treatment Patient Details Name: Jasmine Mclaughlin MRN: [id number] DOB: 02/14/1950 Today's Date: 05/11/2012 Time: 5621-3086 PT Time Calculation (min): 25 min  PT Assessment / Plan / Recommendation Comments on Treatment Session  Patient continuing to progress with mobility. Anticipate DC today. Completed necessary education for DC.     Follow Up Recommendations  Home health PT;Supervision/Assistance - 24 hour;Supervision for mobility/OOB     Does the patient have the potential to tolerate intense rehabilitation     Barriers to Discharge        Equipment Recommendations  Rolling walker with 5" wheels    Recommendations for Other Services    Frequency 7X/week   Plan Discharge plan remains appropriate;Frequency remains appropriate    Precautions / Restrictions Precautions Precautions: Back Precaution Comments: able to recall 3/3 back precautions Required Braces or Orthoses: Spinal Brace Spinal Brace: Thoracolumbosacral orthotic;Other (comment) Spinal Brace Comments: donned seated eob, adjusted in standing   Pertinent Vitals/Pain     Mobility  Bed Mobility Bed Mobility: Sit to Supine Sitting - Scoot to Edge of Bed: 6: Modified independent (Device/Increase time) Sit to Supine: 6: Modified independent (Device/Increase time) Transfers Sit to Stand: 6: Modified independent (Device/Increase time) Stand to Sit: 6: Modified independent (Device/Increase time) Ambulation/Gait Ambulation/Gait Assistance: 5: Supervision Ambulation Distance (Feet): 200 Feet Assistive device: Rolling walker Ambulation/Gait Assistance Details: Cues to not slide feet Gait Pattern: Step-through pattern;Decreased stride length Gait velocity: decreased Stairs Assistance: 4: Min assist Stairs Assistance Details (indicate cue type and reason): B HHA.  Stair Management Technique: Forwards;No rails;Step to pattern Number of Stairs: 5    Exercises     PT Diagnosis:    PT Problem List:   PT  Treatment Interventions:     PT Goals Acute Rehab PT Goals PT Goal: Sit to Supine/Side - Progress: Met PT Goal: Sit to Stand - Progress: Met PT Goal: Stand to Sit - Progress: Met PT Goal: Ambulate - Progress: Progressing toward goal PT Goal: Up/Down Stairs - Progress: Met Additional Goals PT Goal: Additional Goal #1 - Progress: Met  Visit Information  Last PT Received On: 05/11/12 Assistance Needed: +1    Subjective Data      Cognition  Cognition Arousal/Alertness: Awake/alert Behavior During Therapy: WFL for tasks assessed/performed Overall Cognitive Status: Within Functional Limits for tasks assessed    Balance     End of Session PT - End of Session Equipment Utilized During Treatment: Back brace Activity Tolerance: Patient tolerated treatment well Patient left: in bed;with call bell/phone within reach;with family/visitor present Nurse Communication: Mobility status   GP     Fredrich Birks 05/11/2012, 8:25 AM 05/11/2012 Fredrich Birks PTA [phone] pager [phone] office

## 2012-05-11 NOTE — Progress Notes (Signed)
Patient doing well, + LBP improving. Leg pain is resolved. Has been up with PT already today and feels very comfortable ambulating and doing steps. R knee px improved today. Pain well controlled on oral meds.   BP 111/57  Pulse 74  Temp(Src) 98.7 F (37.1 C) (Oral)  Resp 20  Ht 5\' 3"  (1.6 m)  Wt 90.719 kg (200 lb)  BMI 35.44 kg/m2  SpO2 94%  TLSO fitting appropriately, R thigh extension. NVI, Lateral and posterior dressings CDI, - Homans, 2+ DPP, SCD's in place, neurovascular intact. Mild TTP over distal quadriceps with eccentric motions. R knee stable, no laxity in any plane.   S/p 2-5 XLIF and 2-5 PSF   - D/C home today  - TLSO brace at all times when OOB     - Pain meds: oxycontin & valium, written scripts in chart for D/C, pt prefers norco over percocet, faxed in    - Pt will require HH, has been established   - F/U in office 2wks PO, appt already established

## 2012-05-12 MED FILL — Heparin Sodium (Porcine) Inj 1000 Unit/ML: INTRAMUSCULAR | Qty: 30 | Status: AC

## 2012-05-12 MED FILL — Sodium Chloride IV Soln 0.9%: INTRAVENOUS | Qty: 1000 | Status: AC

## 2012-05-20 NOTE — Discharge Summary (Signed)
Patient ID: Jasmine Mclaughlin MRN: [id number] DOB/AGE: [birth year] 62 y.o.  Admit date: 05/06/2012 Discharge date: 05/20/2012  Admission Diagnoses:  Active Problems:   * No active hospital problems. *   Discharge Diagnoses:  Same  Past Medical History  Diagnosis Date  . Colon polyps   . Genital warts   . Asthma   . Shortness of breath     pt./ reports that she is out of shape  . Arthritis     degenerative back, joint disturbance "everywhere "    Surgeries: Procedure(s): POSTERIOR LUMBAR FUSION 3 LEVEL on 05/06/2012 - 05/07/2012   Consultants:    Discharged Condition: Improved  Hospital Course: Jasmine Mclaughlin is an 62 y.o. female who was admitted 05/06/2012 for operative treatment of<principal problem not specified>. Patient has severe unremitting pain that affects sleep, daily activities, and work/hobbies. After pre-op clearance the patient was taken to the operating room on 05/06/2012 - 05/07/2012 and underwent  Procedure(s): POSTERIOR LUMBAR FUSION 3 LEVEL.    Patient was given perioperative antibiotics:  Anti-infectives   Start     Dose/Rate Route Frequency Ordered Stop   05/07/12 2130  ceFAZolin (ANCEF) IVPB 1 g/50 mL premix     1 g 100 mL/hr over 30 Minutes Intravenous Every 8 hours 05/07/12 1654 05/08/12 0514   05/06/12 2100  ceFAZolin (ANCEF) IVPB 1 g/50 mL premix     1 g 100 mL/hr over 30 Minutes Intravenous Every 8 hours 05/06/12 1842 05/07/12 0540   05/06/12 0600  ceFAZolin (ANCEF) IVPB 2 g/50 mL premix     2 g 100 mL/hr over 30 Minutes Intravenous On call to O.R. 05/05/12 1433 05/06/12 1318   05/06/12 0600  ceFAZolin (ANCEF) IVPB 2 g/50 mL premix  Status:  Discontinued     2 g 100 mL/hr over 30 Minutes Intravenous On call to O.R. 05/05/12 1452 05/06/12 1721       Patient was given sequential compression devices, early ambulation, and chemoprophylaxis to prevent DVT.  Patient benefited maximally from hospital stay and there were no complications.    Recent vital signs:  No data found.    Recent laboratory studies: No results found for this basename: WBC, HGB, HCT, PLT, NA, K, CL, CO2, BUN, CREATININE, GLUCOSE, PT, INR, CALCIUM, 2,  in the last 72 hours   Discharge Medications:     Medication List    STOP taking these medications       celecoxib 200 MG capsule  Commonly known as:  CELEBREX     cyclobenzaprine 10 MG tablet  Commonly known as:  FLEXERIL     HYDROcodone-acetaminophen 5-325 MG per tablet  Commonly known as:  NORCO/VICODIN      TAKE these medications       calcium carbonate 600 MG Tabs  Commonly known as:  OS-CAL  Take 600 mg by mouth 3 (three) times daily.     diphenhydrAMINE 50 MG capsule  Commonly known as:  BENADRYL  Take 50 mg by mouth at bedtime as needed for itching.     docusate sodium 100 MG capsule  Commonly known as:  COLACE  Take 100 mg by mouth 3 (three) times daily as needed for constipation.     OxyCODONE 10 mg T12a  Commonly known as:  OXYCONTIN  Take 1 tablet (10 mg total) by mouth every 12 (twelve) hours.        Diagnostic Studies: Dg Chest 2 View  04/28/2012   *RADIOLOGY REPORT*  Clinical Data: Preoperative assessment for  low back surgery, history smoking, asthma  CHEST - 2 VIEW  Comparison: None  Findings: Upper-normal size of cardiac silhouette. Mediastinal contours and pulmonary vascularity normal. Subsegmental atelectasis versus scarring at lingula. Lungs otherwise clear. No pleural effusion or pneumothorax. No acute osseous findings.  IMPRESSION: Minimal subsegmental atelectasis versus scarring at lingula.   Original Report Authenticated By: Ulyses Southward, M.D.   Dg Lumbar Spine 2-3 Views  05/07/2012   *RADIOLOGY REPORT*  Clinical Data: Back pain  DG C-ARM GT 120 MIN,LUMBAR SPINE - 2-3 VIEW  Technique:  C-arm fluoroscopic images were obtained intraoperatively and submitted for postoperative interpretation. Please see the performing provider's procedural report for the fluoroscopy time utilized.   Comparison: MRI 09/28/2011  Findings: C-arm films document L2-L5 posterolateral fusion.  IMPRESSION: As above.   Original Report Authenticated By: Davonna Belling, M.D.   Dg Lumbar Spine 2-3 Views  05/06/2012   *RADIOLOGY REPORT*  Clinical Data: Lumbar discectomy.  LUMBAR SPINE - 2-3 VIEW,DG C-ARM GT 120 MIN  Comparison: MRI lumbar spine 09/28/2011.  Findings: We are provided with four intraoperative fluoroscopic spot views of the lumbar spine.  Images demonstrate interbody spacers from L2-L5.  Vertebral body height is maintained.  Convex right scoliosis is noted.  IMPRESSION: Anterior discectomy L2-L5   Original Report Authenticated By: Holley Dexter, M.D.   Dg C-arm Gt 120 Min  05/07/2012   *RADIOLOGY REPORT*  Clinical Data: Back pain  DG C-ARM GT 120 MIN,LUMBAR SPINE - 2-3 VIEW  Technique:  C-arm fluoroscopic images were obtained intraoperatively and submitted for postoperative interpretation. Please see the performing provider's procedural report for the fluoroscopy time utilized.  Comparison: MRI 09/28/2011  Findings: C-arm films document L2-L5 posterolateral fusion.  IMPRESSION: As above.   Original Report Authenticated By: Davonna Belling, M.D.   Dg C-arm Gt 120 Min  05/06/2012   *RADIOLOGY REPORT*  Clinical Data: Lumbar discectomy.  LUMBAR SPINE - 2-3 VIEW,DG C-ARM GT 120 MIN  Comparison: MRI lumbar spine 09/28/2011.  Findings: We are provided with four intraoperative fluoroscopic spot views of the lumbar spine.  Images demonstrate interbody spacers from L2-L5.  Vertebral body height is maintained.  Convex right scoliosis is noted.  IMPRESSION: Anterior discectomy L2-L5   Original Report Authenticated By: Holley Dexter, M.D.    Disposition: 01-Home or Self Care       Signed: Georga Bora 05/20/2012, 11:22 AM Patient ID: Jasmine Mclaughlin MRN: [id number] DOB/AGE: 06/02/1950 62 y.o.  Admit date: 05/06/2012 Discharge date: 05/11/2012  Admission Diagnoses: Lumbar Radiculopathy  Discharge  Diagnoses:  Same  Past Medical History  Diagnosis Date  . Colon polyps   . Genital warts   . Asthma   . Shortness of breath     pt./ reports that she is out of shape  . Arthritis     degenerative back, joint disturbance "everywhere "    Surgeries: Procedure(s): 2-5 XLIF and 2-5 PSF in staged fashion on 05/06/2012 - 05/07/2012   Discharged Condition: Improved  Hospital Course: Jasmine Mclaughlin is an 62 y.o. female who was admitted 05/06/2012 for operative treatment of lumbar radiculopathy. Patient has severe unremitting pain that affects sleep, daily activities, and work/hobbies. After pre-op clearance the patient was taken to the operating room on 05/06/2012 - 05/07/2012 and underwent  Procedure(s): 2-5 XLIF and 2-5 PSF in staged fashion   Patient was given perioperative antibiotics:  Anti-infectives   Start     Dose/Rate Route Frequency Ordered Stop   05/07/12 2130  ceFAZolin (ANCEF) IVPB 1 g/50  mL premix     1 g 100 mL/hr over 30 Minutes Intravenous Every 8 hours 05/07/12 1654 05/08/12 0514   05/06/12 2100  ceFAZolin (ANCEF) IVPB 1 g/50 mL premix     1 g 100 mL/hr over 30 Minutes Intravenous Every 8 hours 05/06/12 1842 05/07/12 0540   05/06/12 0600  ceFAZolin (ANCEF) IVPB 2 g/50 mL premix     2 g 100 mL/hr over 30 Minutes Intravenous On call to O.R. 05/05/12 1433 05/06/12 1318   05/06/12 0600  ceFAZolin (ANCEF) IVPB 2 g/50 mL premix  Status:  Discontinued     2 g 100 mL/hr over 30 Minutes Intravenous On call to O.R. 05/05/12 1452 05/06/12 1721       Patient was given sequential compression devices, early ambulation to prevent DVT.  Patient benefited maximally from hospital stay and there were no complications.    Recent vital signs: BP 111/57  Pulse 74  Temp(Src) 98.7 F (37.1 C) (Oral)  Resp 20  Ht 5\' 3"  (1.6 m)  Wt 90.719 kg (200 lb)  BMI 35.44 kg/m2  SpO2 94%   Discharge Medications:     Medication List    STOP taking these medications       celecoxib 200 MG  capsule  Commonly known as:  CELEBREX     cyclobenzaprine 10 MG tablet  Commonly known as:  FLEXERIL     HYDROcodone-acetaminophen 5-325 MG per tablet  Commonly known as:  NORCO/VICODIN      TAKE these medications       calcium carbonate 600 MG Tabs  Commonly known as:  OS-CAL  Take 600 mg by mouth 3 (three) times daily.     diphenhydrAMINE 50 MG capsule  Commonly known as:  BENADRYL  Take 50 mg by mouth at bedtime as needed for itching.     docusate sodium 100 MG capsule  Commonly known as:  COLACE  Take 100 mg by mouth 3 (three) times daily as needed for constipation.     OxyCODONE 10 mg T12a  Commonly known as:  OXYCONTIN  Take 1 tablet (10 mg total) by mouth every 12 (twelve) hours.        Diagnostic Studies: Dg Chest 2 View  04/28/2012   *RADIOLOGY REPORT*  Clinical Data: Preoperative assessment for low back surgery, history smoking, asthma  CHEST - 2 VIEW  Comparison: None  Findings: Upper-normal size of cardiac silhouette. Mediastinal contours and pulmonary vascularity normal. Subsegmental atelectasis versus scarring at lingula. Lungs otherwise clear. No pleural effusion or pneumothorax. No acute osseous findings.  IMPRESSION: Minimal subsegmental atelectasis versus scarring at lingula.   Original Report Authenticated By: Ulyses Southward, M.D.   Dg Lumbar Spine 2-3 Views  05/07/2012   *RADIOLOGY REPORT*  Clinical Data: Back pain  DG C-ARM GT 120 MIN,LUMBAR SPINE - 2-3 VIEW  Technique:  C-arm fluoroscopic images were obtained intraoperatively and submitted for postoperative interpretation. Please see the performing provider's procedural report for the fluoroscopy time utilized.  Comparison: MRI 09/28/2011  Findings: C-arm films document L2-L5 posterolateral fusion.  IMPRESSION: As above.   Original Report Authenticated By: Davonna Belling, M.D.   Dg Lumbar Spine 2-3 Views  05/06/2012   *RADIOLOGY REPORT*  Clinical Data: Lumbar discectomy.  LUMBAR SPINE - 2-3 VIEW,DG C-ARM GT 120 MIN   Comparison: MRI lumbar spine 09/28/2011.  Findings: We are provided with four intraoperative fluoroscopic spot views of the lumbar spine.  Images demonstrate interbody spacers from L2-L5.  Vertebral body height is maintained.  Convex right scoliosis is noted.  IMPRESSION: Anterior discectomy L2-L5   Original Report Authenticated By: Holley Dexter, M.D.   Dg C-arm Gt 120 Min  05/07/2012   *RADIOLOGY REPORT*  Clinical Data: Back pain  DG C-ARM GT 120 MIN,LUMBAR SPINE - 2-3 VIEW  Technique:  C-arm fluoroscopic images were obtained intraoperatively and submitted for postoperative interpretation. Please see the performing provider's procedural report for the fluoroscopy time utilized.  Comparison: MRI 09/28/2011  Findings: C-arm films document L2-L5 posterolateral fusion.  IMPRESSION: As above.   Original Report Authenticated By: Davonna Belling, M.D.   Dg C-arm Gt 120 Min  05/06/2012   *RADIOLOGY REPORT*  Clinical Data: Lumbar discectomy.  LUMBAR SPINE - 2-3 VIEW,DG C-ARM GT 120 MIN  Comparison: MRI lumbar spine 09/28/2011.  Findings: We are provided with four intraoperative fluoroscopic spot views of the lumbar spine.  Images demonstrate interbody spacers from L2-L5.  Vertebral body height is maintained.  Convex right scoliosis is noted.  IMPRESSION: Anterior discectomy L2-L5   Original Report Authenticated By: Holley Dexter, M.D.    Disposition: 01-Home or Self Care   S/p 2-5 XLIF and 2-5 PSF  - D/C home today  - TLSO brace at all times when OOB  - Pain meds: oxycontin & valium, written scripts in chart for D/C, pt prefers norco over percocet, faxed in  - Pt will require HH, has been established  - F/U in office 2wks PO, appt already established   Signed: Georga Bora 05/20/2012, 11:22 AM

## 2012-06-22 ENCOUNTER — Other Ambulatory Visit: Payer: Self-pay

## 2012-06-22 DIAGNOSIS — Z1231 Encounter for screening mammogram for malignant neoplasm of breast: Secondary | ICD-10-CM

## 2012-07-31 ENCOUNTER — Ambulatory Visit: Payer: 59

## 2012-08-21 ENCOUNTER — Ambulatory Visit
Admission: RE | Admit: 2012-08-21 | Discharge: 2012-08-21 | Disposition: A | Discharge status: Home / Self Care | Payer: 59 | Source: Ambulatory Visit

## 2012-08-21 DIAGNOSIS — Z1231 Encounter for screening mammogram for malignant neoplasm of breast: Secondary | ICD-10-CM

## 2012-08-21 IMAGING — MG MM DIGITAL SCREENING BILAT
4 series · 4 of 4 positions shown · non-contrast
Comparison: Previous exam(s).

CLINICAL DATA: Screening.

DIGITAL SCREENING BILATERAL MAMMOGRAM WITH CAD

[L MLO]
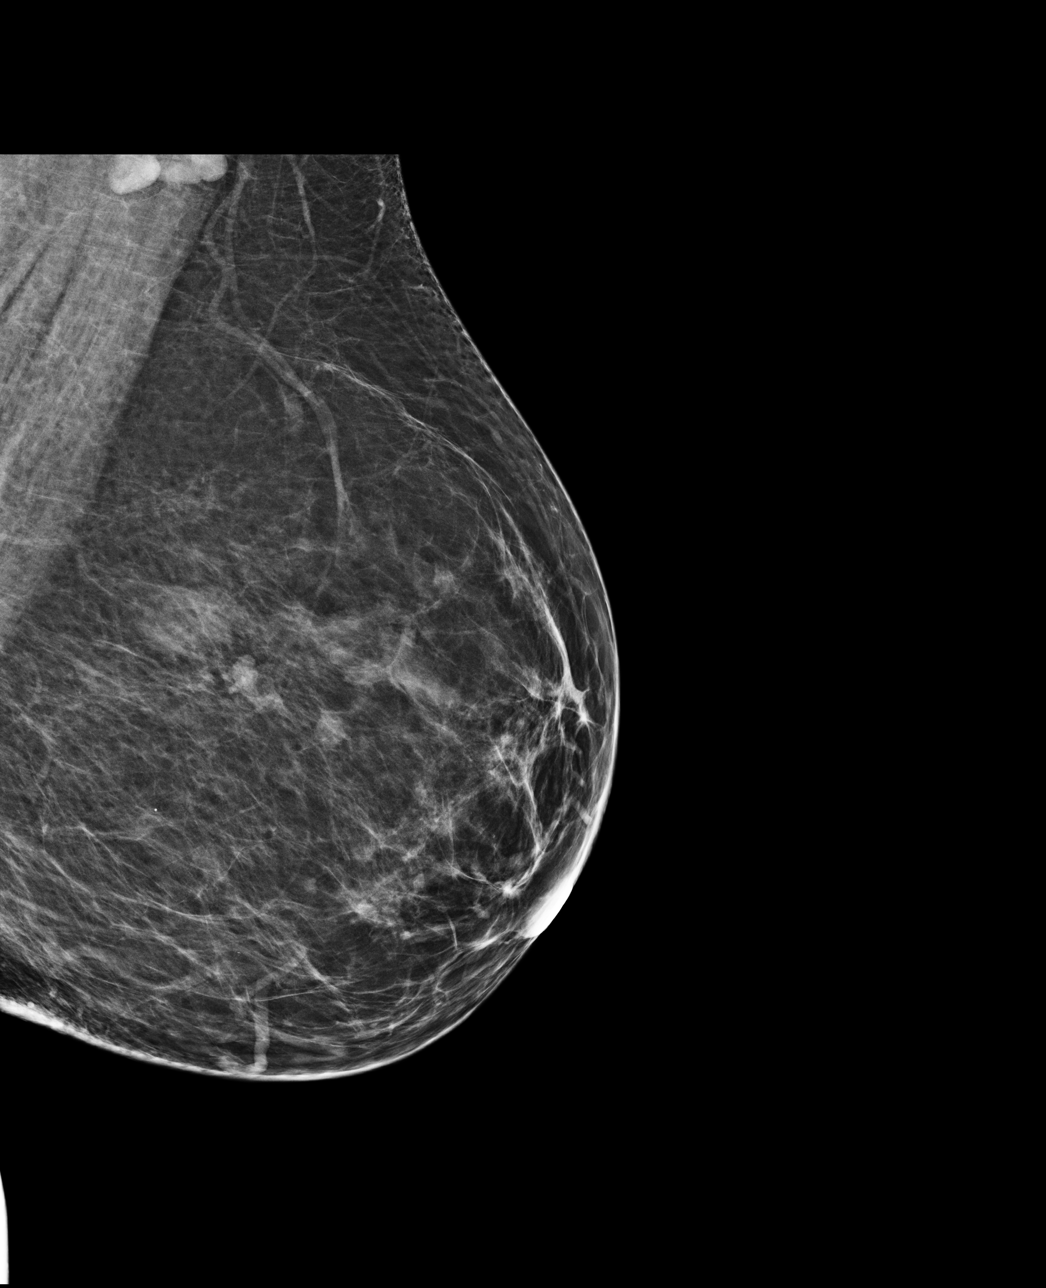

[L CC]
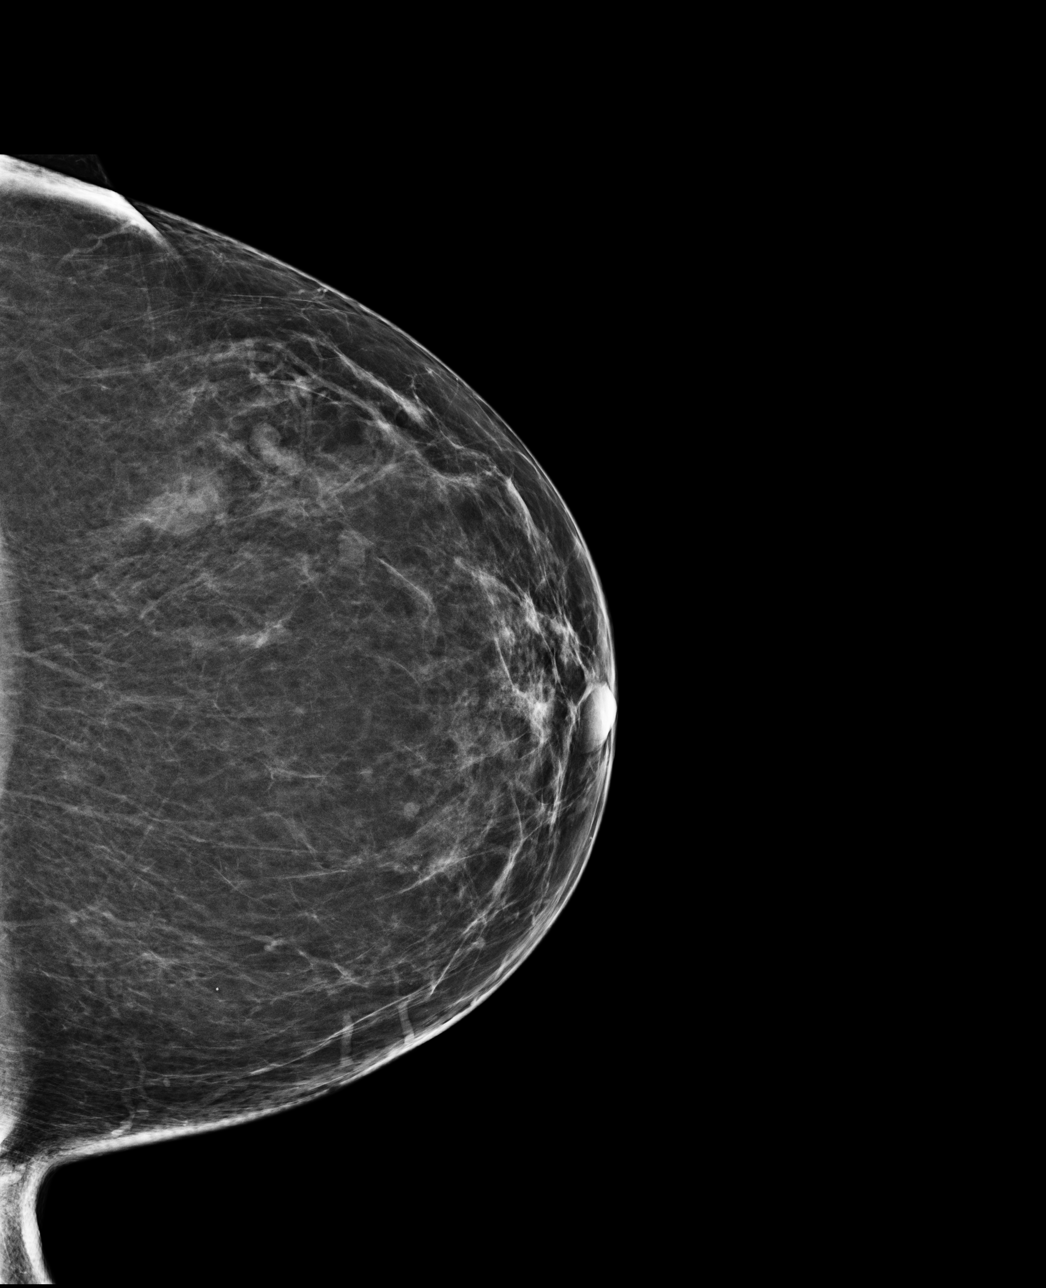

[R CC]
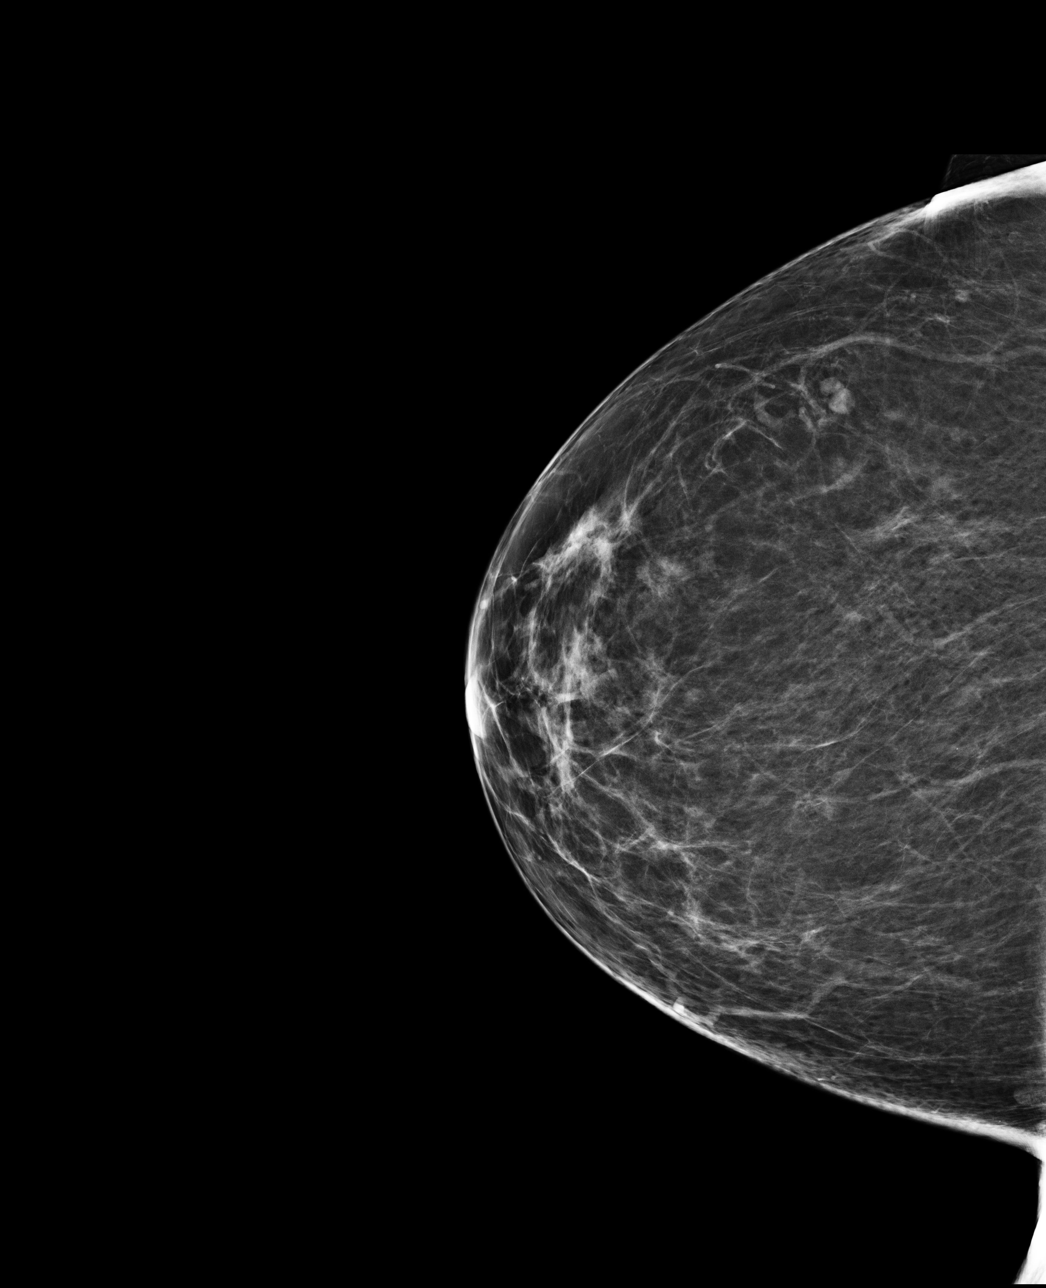

[R MLO]
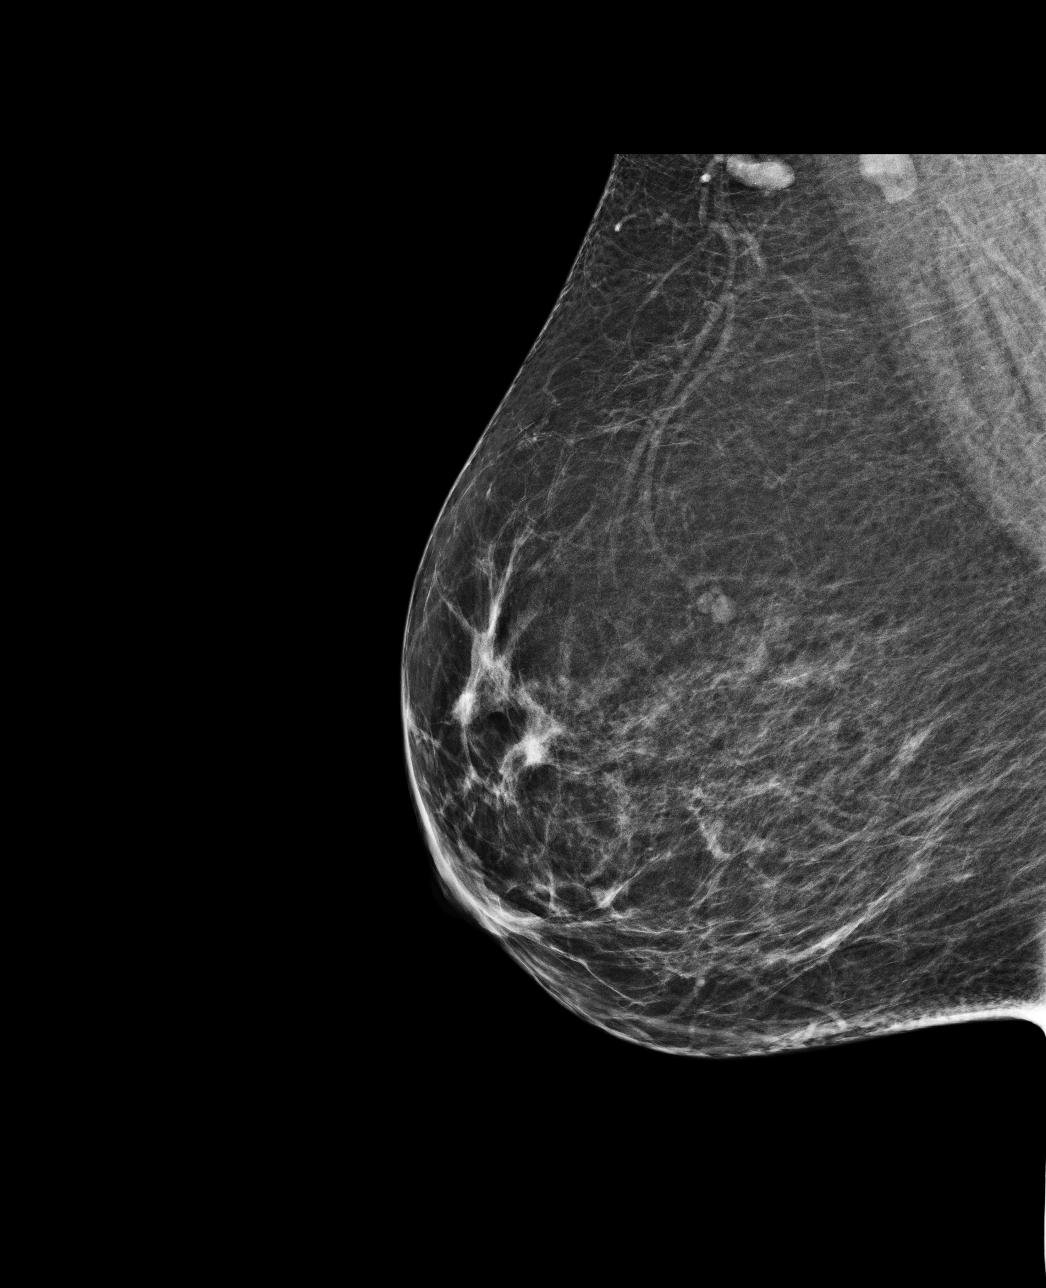

[4 of 4 positions shown; findings below may reference images not displayed]

FINDINGS: ACR Breast Density Category b:  There are scattered areas of
fibroglandular density.

There are no findings suspicious for malignancy.

Images were processed with CAD.
IMPRESSION: No mammographic evidence of malignancy.

A result letter of this screening mammogram will be mailed directly
to the patient.

RECOMMENDATION:
Screening mammogram in one year. (Code:[C3])

BI-RADS CATEGORY 1:  Negative.

## 2012-10-13 ENCOUNTER — Telehealth: Payer: Self-pay

## 2012-10-13 NOTE — Telephone Encounter (Signed)
LM for CB  HM reviewed. UTD Due as noted: flu and zostavax vaccines

## 2012-10-13 NOTE — Telephone Encounter (Signed)
Medication and allergies: done  No Mail order Pharmacy updated, uses [facility] for local pharmacy  HM UTD: Immunizations: due for flu (gets at work) Zostavax admin today  A/P: PAP:   done  Last:  08/2011 MMG:  done Last:  08/2011 Dexa:  Last: CCS: done   To Discuss with Provider: Would like to have PAP today

## 2012-10-14 ENCOUNTER — Encounter: Payer: Self-pay | Admitting: Family Medicine

## 2012-10-14 ENCOUNTER — Other Ambulatory Visit (HOSPITAL_COMMUNITY)
Admission: RE | Admit: 2012-10-14 | Discharge: 2012-10-14 | Disposition: A | Discharge status: Home / Self Care | Payer: 59 | Source: Ambulatory Visit | Attending: Family Medicine | Admitting: Family Medicine

## 2012-10-14 ENCOUNTER — Ambulatory Visit (INDEPENDENT_AMBULATORY_CARE_PROVIDER_SITE_OTHER): Payer: 59 | Admitting: Family Medicine

## 2012-10-14 VITALS — BP 124/78 | HR 66 | Temp 98.1°F | Ht 63.5 in | Wt 208.4 lb

## 2012-10-14 DIAGNOSIS — Z1151 Encounter for screening for human papillomavirus (HPV): Secondary | ICD-10-CM | POA: Insufficient documentation

## 2012-10-14 DIAGNOSIS — Z23 Encounter for immunization: Secondary | ICD-10-CM

## 2012-10-14 DIAGNOSIS — F430 Acute stress reaction: Secondary | ICD-10-CM

## 2012-10-14 DIAGNOSIS — E669 Obesity, unspecified: Secondary | ICD-10-CM | POA: Insufficient documentation

## 2012-10-14 DIAGNOSIS — M549 Dorsalgia, unspecified: Secondary | ICD-10-CM

## 2012-10-14 DIAGNOSIS — M5136 Other intervertebral disc degeneration, lumbar region: Secondary | ICD-10-CM

## 2012-10-14 DIAGNOSIS — Z124 Encounter for screening for malignant neoplasm of cervix: Secondary | ICD-10-CM

## 2012-10-14 DIAGNOSIS — Z2911 Encounter for prophylactic immunotherapy for respiratory syncytial virus (RSV): Secondary | ICD-10-CM

## 2012-10-14 DIAGNOSIS — D229 Melanocytic nevi, unspecified: Secondary | ICD-10-CM

## 2012-10-14 DIAGNOSIS — Z01419 Encounter for gynecological examination (general) (routine) without abnormal findings: Secondary | ICD-10-CM | POA: Insufficient documentation

## 2012-10-14 DIAGNOSIS — Z0000 Encounter for general adult medical examination without abnormal findings: Secondary | ICD-10-CM

## 2012-10-14 DIAGNOSIS — M51369 Other intervertebral disc degeneration, lumbar region without mention of lumbar back pain or lower extremity pain: Secondary | ICD-10-CM | POA: Insufficient documentation

## 2012-10-14 DIAGNOSIS — IMO0002 Reserved for concepts with insufficient information to code with codable children: Secondary | ICD-10-CM

## 2012-10-14 HISTORY — DX: Obesity, unspecified: E66.9

## 2012-10-14 HISTORY — DX: Other intervertebral disc degeneration, lumbar region: M51.36

## 2012-10-14 HISTORY — DX: Other intervertebral disc degeneration, lumbar region without mention of lumbar back pain or lower extremity pain: M51.369

## 2012-10-14 LAB — CBC WITH DIFFERENTIAL/PLATELET
Basophils Absolute: 0 10*3/uL (ref 0.0–0.1)
Eosinophils Absolute: 0.2 10*3/uL (ref 0.0–0.7)
HCT: 40 % (ref 36.0–46.0)
Hemoglobin: 13.5 g/dL (ref 12.0–15.0)
Lymphocytes Relative: 23.9 % (ref 12.0–46.0)
Lymphs Abs: 2.6 10*3/uL (ref 0.7–4.0)
MCHC: 33.7 g/dL (ref 30.0–36.0)
Neutro Abs: 7.3 10*3/uL (ref 1.4–7.7)
Platelets: 344 10*3/uL (ref 150.0–400.0)
RDW: 14.1 % (ref 11.5–14.6)
WBC: 10.7 10*3/uL — ABNORMAL HIGH (ref 4.5–10.5)

## 2012-10-14 LAB — POCT URINALYSIS DIPSTICK
Bilirubin, UA: NEGATIVE
Glucose, UA: NEGATIVE
Ketones, UA: NEGATIVE
Nitrite, UA: NEGATIVE
Protein, UA: NEGATIVE
Spec Grav, UA: <=1.005
pH, UA: 7.5

## 2012-10-14 MED ORDER — CYCLOBENZAPRINE HCL 10 MG PO TABS: ORAL_TABLET | ORAL | Status: DC

## 2012-10-14 MED ORDER — CITALOPRAM HYDROBROMIDE 10 MG PO TABS: 10.0000 mg | ORAL_TABLET | Freq: Every day | ORAL | Status: DC

## 2012-10-14 MED ORDER — CELECOXIB 200 MG PO CAPS: 200.0000 mg | ORAL_CAPSULE | Freq: Two times a day (BID) | ORAL | Status: DC

## 2012-10-14 NOTE — Assessment & Plan Note (Signed)
Refill celebrex.

## 2012-10-14 NOTE — Progress Notes (Signed)
Subjective:     Jasmine Mclaughlin is a 62 y.o. female and is here for a comprehensive physical exam. The patient reports problems - increase stress at work which is causing problems at home..  She takes it out on her husband.    History   Social History  . Marital Status: Single    Spouse Name: N/A    Number of Children: 1  . Years of Education: N/A   Occupational History  . [employer]     sits in front of computer   Social History Main Topics  . Smoking status: Current Every Day Smoker -- 0.70 packs/day for 45 years    Types: Cigarettes  . Smokeless tobacco: Never Used     Comment: she is thinking about it   . Alcohol Use: Yes     Comment: 2 times per month  . Drug Use: No  . Sexual Activity: Yes    Partners: Male   Other Topics Concern  . Not on file   Social History Narrative   1 caffeine drinks daily    Health Maintenance  Topic Date Due  . Influenza Vaccine  10/14/2013  . Pap Smear  08/28/2013  . Colonoscopy  10/24/2013  . Mammogram  08/22/2014  . Tetanus/tdap  08/28/2020  . Zostavax  Completed    The following portions of the patient's history were reviewed and updated as appropriate:  She  has a past medical history of Colon polyps; Genital warts; Asthma; Shortness of breath; and Arthritis. She  does not have any pertinent problems on file. She  has past surgical history that includes Knee arthroscopy; Tonsillectomy; Vaginal delivery; Anterior fusion lumbar spine (05/06/2012); and Anterior lat lumbar fusion (N/A, 05/06/2012). Her family history includes Arthritis in her father and mother; Breast cancer in her maternal grandmother; Colon cancer (age of onset: 31) in her father; Heart disease (age of onset: 35) in her father; Stroke in her father. She  reports that she has been smoking Cigarettes.  She has a 31.5 pack-year smoking history. She has never used smokeless tobacco. She reports that she drinks alcohol. She reports that she does not use illicit drugs. She has a  current medication list which includes the following prescription(s): acetaminophen, calcium carbonate, diphenhydramine, docusate sodium, cholecalciferol, celecoxib, and cyclobenzaprine. Current Outpatient Prescriptions on File Prior to Visit  Medication Sig Dispense Refill  . diphenhydrAMINE (BENADRYL) 50 MG capsule Take 50 mg by mouth at bedtime as needed for sleep.       Marland Kitchen docusate sodium (COLACE) 100 MG capsule Take 100 mg by mouth 3 (three) times daily as needed for constipation.       No current facility-administered medications on file prior to visit.   She has No Known Allergies..  Review of Systems Review of Systems  Constitutional: Negative for activity change, appetite change and fatigue.  HENT: Negative for hearing loss, congestion, tinnitus and ear discharge.  no Eyes: Negative for visual disturbance (see optho q1y -- vision corrected to 20/20 with glasses).  Respiratory: Negative for cough, chest tightness and shortness of breath.   Cardiovascular: Negative for chest pain, palpitations and leg swelling.  Gastrointestinal: Negative for abdominal pain, diarrhea, constipation and abdominal distention.  Genitourinary: Negative for urgency, frequency, decreased urine volume and difficulty urinating.  Musculoskeletal: Negative for back pain, arthralgias and gait problem.  Skin: Negative for color change, pallor and rash.  Neurological: Negative for dizziness, light-headedness, numbness and headaches.  Hematological: Negative for adenopathy. Does not bruise/bleed easily.  Psychiatric/Behavioral: Negative for suicidal ideas, confusion, sleep disturbance, self-injury, dysphoric mood, decreased concentration and agitation.       Objective:    BP 124/78  Pulse 66  Temp(Src) 98.1 F (36.7 C) (Oral)  Ht 5' 3.5" (1.613 m)  Wt 208 lb 6.4 oz (94.53 kg)  BMI 36.33 kg/m2  SpO2 95% General appearance: alert, cooperative, appears stated age and no distress Head: Normocephalic,  without obvious abnormality, atraumatic Eyes: conjunctivae/corneas clear. PERRL, EOM's intact. Fundi benign. Ears: normal TM's and external ear canals both ears Nose: Nares normal. Septum midline. Mucosa normal. No drainage or sinus tenderness. Throat: lips, mucosa, and tongue normal; teeth and gums normal Neck: no adenopathy, no carotid bruit, no JVD, supple, symmetrical, trachea midline and thyroid not enlarged, symmetric, no tenderness/mass/nodules Back: symmetric, no curvature. ROM normal. No CVA tenderness. Lungs: clear to auscultation bilaterally Breasts: normal appearance, no masses or tenderness Heart: regular rate and rhythm, S1, S2 normal, no murmur, click, rub or gallop Abdomen: soft, non-tender; bowel sounds normal; no masses,  no organomegaly Pelvic: cervix normal in appearance, external genitalia normal, no adnexal masses or tenderness, no cervical motion tenderness, rectovaginal septum normal, uterus normal size, shape, and consistency and vagina normal without discharge Extremities: extremities normal, atraumatic, no cyanosis or edema Pulses: 2+ and symmetric Skin: Skin color, texture, turgor normal. No rashes or lesions Lymph nodes: Cervical, supraclavicular, and axillary nodes normal. Neurologic: Alert and oriented X 3, normal strength and tone. Normal symmetric reflexes. Normal coordination and gait Psych-- no anxiety, no depression      Assessment:    Healthy female exam.      Plan:    ghm utd Check labs See After Visit Summary for Counseling Recommendations

## 2012-10-14 NOTE — Addendum Note (Signed)
Addended by: Arnette Norris on: 10/14/2012 01:13 PM   Modules accepted: Orders

## 2012-10-14 NOTE — Patient Instructions (Signed)
Preventive Care for Adults, Female A healthy lifestyle and preventive care can promote health and wellness. Preventive health guidelines for women include the following key practices.  A routine yearly physical is a good way to check with your caregiver about your health and preventive screening. It is a chance to share any concerns and updates on your health, and to receive a thorough exam.  Visit your dentist for a routine exam and preventive care every 6 months. Brush your teeth twice a day and floss once a day. Good oral hygiene prevents tooth decay and gum disease.  The frequency of eye exams is based on your age, health, family medical history, use of contact lenses, and other factors. Follow your caregiver's recommendations for frequency of eye exams.  Eat a healthy diet. Foods like vegetables, fruits, whole grains, low-fat dairy products, and lean protein foods contain the nutrients you need without too many calories. Decrease your intake of foods high in solid fats, added sugars, and salt. Eat the right amount of calories for you.Get information about a proper diet from your caregiver, if necessary.  Regular physical exercise is one of the most important things you can do for your health. Most adults should get at least 150 minutes of moderate-intensity exercise (any activity that increases your heart rate and causes you to sweat) each week. In addition, most adults need muscle-strengthening exercises on 2 or more days a week.  Maintain a healthy weight. The body mass index (BMI) is a screening tool to identify possible weight problems. It provides an estimate of body fat based on height and weight. Your caregiver can help determine your BMI, and can help you achieve or maintain a healthy weight.For adults 20 years and older:  A BMI below 18.5 is considered underweight.  A BMI of 18.5 to 24.9 is normal.  A BMI of 25 to 29.9 is considered overweight.  A BMI of 30 and above is  considered obese.  Maintain normal blood lipids and cholesterol levels by exercising and minimizing your intake of saturated fat. Eat a balanced diet with plenty of fruit and vegetables. Blood tests for lipids and cholesterol should begin at age 20 and be repeated every 5 years. If your lipid or cholesterol levels are high, you are over 50, or you are at high risk for heart disease, you may need your cholesterol levels checked more frequently.Ongoing high lipid and cholesterol levels should be treated with medicines if diet and exercise are not effective.  If you smoke, find out from your caregiver how to quit. If you do not use tobacco, do not start.  If you are pregnant, do not drink alcohol. If you are breastfeeding, be very cautious about drinking alcohol. If you are not pregnant and choose to drink alcohol, do not exceed 1 drink per day. One drink is considered to be 12 ounces (355 mL) of beer, 5 ounces (148 mL) of wine, or 1.5 ounces (44 mL) of liquor.  Avoid use of street drugs. Do not share needles with anyone. Ask for help if you need support or instructions about stopping the use of drugs.  High blood pressure causes heart disease and increases the risk of stroke. Your blood pressure should be checked at least every 1 to 2 years. Ongoing high blood pressure should be treated with medicines if weight loss and exercise are not effective.  If you are 55 to 61 years old, ask your caregiver if you should take aspirin to prevent strokes.  Diabetes   screening involves taking a blood sample to check your fasting blood sugar level. This should be done once every 3 years, after age 45, if you are within normal weight and without risk factors for diabetes. Testing should be considered at a younger age or be carried out more frequently if you are overweight and have at least 1 risk factor for diabetes.  Breast cancer screening is essential preventive care for women. You should practice "breast  self-awareness." This means understanding the normal appearance and feel of your breasts and may include breast self-examination. Any changes detected, no matter how small, should be reported to a caregiver. Women in their 20s and 30s should have a clinical breast exam (CBE) by a caregiver as part of a regular health exam every 1 to 3 years. After age 40, women should have a CBE every year. Starting at age 40, women should consider having a mammography (breast X-ray test) every year. Women who have a family history of breast cancer should talk to their caregiver about genetic screening. Women at a high risk of breast cancer should talk to their caregivers about having magnetic resonance imaging (MRI) and a mammography every year.  The Pap test is a screening test for cervical cancer. A Pap test can show cell changes on the cervix that might become cervical cancer if left untreated. A Pap test is a procedure in which cells are obtained and examined from the lower end of the uterus (cervix).  Women should have a Pap test starting at age 21.  Between ages 21 and 29, Pap tests should be repeated every 2 years.  Beginning at age 30, you should have a Pap test every 3 years as long as the past 3 Pap tests have been normal.  Some women have medical problems that increase the chance of getting cervical cancer. Talk to your caregiver about these problems. It is especially important to talk to your caregiver if a new problem develops soon after your last Pap test. In these cases, your caregiver may recommend more frequent screening and Pap tests.  The above recommendations are the same for women who have or have not gotten the vaccine for human papillomavirus (HPV).  If you had a hysterectomy for a problem that was not cancer or a condition that could lead to cancer, then you no longer need Pap tests. Even if you no longer need a Pap test, a regular exam is a good idea to make sure no other problems are  starting.  If you are between ages 65 and 70, and you have had normal Pap tests going back 10 years, you no longer need Pap tests. Even if you no longer need a Pap test, a regular exam is a good idea to make sure no other problems are starting.  If you have had past treatment for cervical cancer or a condition that could lead to cancer, you need Pap tests and screening for cancer for at least 20 years after your treatment.  If Pap tests have been discontinued, risk factors (such as a new sexual partner) need to be reassessed to determine if screening should be resumed.  The HPV test is an additional test that may be used for cervical cancer screening. The HPV test looks for the virus that can cause the cell changes on the cervix. The cells collected during the Pap test can be tested for HPV. The HPV test could be used to screen women aged 30 years and older, and should   be used in women of any age who have unclear Pap test results. After the age of 30, women should have HPV testing at the same frequency as a Pap test.  Colorectal cancer can be detected and often prevented. Most routine colorectal cancer screening begins at the age of 50 and continues through age 75. However, your caregiver may recommend screening at an earlier age if you have risk factors for colon cancer. On a yearly basis, your caregiver may provide home test kits to check for hidden blood in the stool. Use of a small camera at the end of a tube, to directly examine the colon (sigmoidoscopy or colonoscopy), can detect the earliest forms of colorectal cancer. Talk to your caregiver about this at age 50, when routine screening begins. Direct examination of the colon should be repeated every 5 to 10 years through age 75, unless early forms of pre-cancerous polyps or small growths are found.  Hepatitis C blood testing is recommended for all people born from 1945 through 1965 and any individual with known risks for hepatitis C.  Practice  safe sex. Use condoms and avoid high-risk sexual practices to reduce the spread of sexually transmitted infections (STIs). STIs include gonorrhea, chlamydia, syphilis, trichomonas, herpes, HPV, and human immunodeficiency virus (HIV). Herpes, HIV, and HPV are viral illnesses that have no cure. They can result in disability, cancer, and death. Sexually active women aged 25 and younger should be checked for chlamydia. Older women with new or multiple partners should also be tested for chlamydia. Testing for other STIs is recommended if you are sexually active and at increased risk.  Osteoporosis is a disease in which the bones lose minerals and strength with aging. This can result in serious bone fractures. The risk of osteoporosis can be identified using a bone density scan. Women ages 65 and over and women at risk for fractures or osteoporosis should discuss screening with their caregivers. Ask your caregiver whether you should take a calcium supplement or vitamin D to reduce the rate of osteoporosis.  Menopause can be associated with physical symptoms and risks. Hormone replacement therapy is available to decrease symptoms and risks. You should talk to your caregiver about whether hormone replacement therapy is right for you.  Use sunscreen with sun protection factor (SPF) of 30 or more. Apply sunscreen liberally and repeatedly throughout the day. You should seek shade when your shadow is shorter than you. Protect yourself by wearing long sleeves, pants, a wide-brimmed hat, and sunglasses year round, whenever you are outdoors.  Once a month, do a whole body skin exam, using a mirror to look at the skin on your back. Notify your caregiver of new moles, moles that have irregular borders, moles that are larger than a pencil eraser, or moles that have changed in shape or color.  Stay current with required immunizations.  Influenza. You need a dose every fall (or winter). The composition of the flu vaccine  changes each year, so being vaccinated once is not enough.  Pneumococcal polysaccharide. You need 1 to 2 doses if you smoke cigarettes or if you have certain chronic medical conditions. You need 1 dose at age 65 (or older) if you have never been vaccinated.  Tetanus, diphtheria, pertussis (Tdap, Td). Get 1 dose of Tdap vaccine if you are younger than age 65, are over 65 and have contact with an infant, are a healthcare worker, are pregnant, or simply want to be protected from whooping cough. After that, you need a Td   booster dose every 10 years. Consult your caregiver if you have not had at least 3 tetanus and diphtheria-containing shots sometime in your life or have a deep or dirty wound.  HPV. You need this vaccine if you are a woman age 26 or younger. The vaccine is given in 3 doses over 6 months.  Measles, mumps, rubella (MMR). You need at least 1 dose of MMR if you were born in 1957 or later. You may also need a second dose.  Meningococcal. If you are age 19 to 21 and a first-year college student living in a residence hall, or have one of several medical conditions, you need to get vaccinated against meningococcal disease. You may also need additional booster doses.  Zoster (shingles). If you are age 60 or older, you should get this vaccine.  Varicella (chickenpox). If you have never had chickenpox or you were vaccinated but received only 1 dose, talk to your caregiver to find out if you need this vaccine.  Hepatitis A. You need this vaccine if you have a specific risk factor for hepatitis A virus infection or you simply wish to be protected from this disease. The vaccine is usually given as 2 doses, 6 to 18 months apart.  Hepatitis B. You need this vaccine if you have a specific risk factor for hepatitis B virus infection or you simply wish to be protected from this disease. The vaccine is given in 3 doses, usually over 6 months. Preventive Services / Frequency Ages 19 to 39  Blood  pressure check.** / Every 1 to 2 years.  Lipid and cholesterol check.** / Every 5 years beginning at age 20.  Clinical breast exam.** / Every 3 years for women in their 20s and 30s.  Pap test.** / Every 2 years from ages 21 through 29. Every 3 years starting at age 30 through age 65 or 70 with a history of 3 consecutive normal Pap tests.  HPV screening.** / Every 3 years from ages 30 through ages 65 to 70 with a history of 3 consecutive normal Pap tests.  Hepatitis C blood test.** / For any individual with known risks for hepatitis C.  Skin self-exam. / Monthly.  Influenza immunization.** / Every year.  Pneumococcal polysaccharide immunization.** / 1 to 2 doses if you smoke cigarettes or if you have certain chronic medical conditions.  Tetanus, diphtheria, pertussis (Tdap, Td) immunization. / A one-time dose of Tdap vaccine. After that, you need a Td booster dose every 10 years.  HPV immunization. / 3 doses over 6 months, if you are 26 and younger.  Measles, mumps, rubella (MMR) immunization. / You need at least 1 dose of MMR if you were born in 1957 or later. You may also need a second dose.  Meningococcal immunization. / 1 dose if you are age 19 to 21 and a first-year college student living in a residence hall, or have one of several medical conditions, you need to get vaccinated against meningococcal disease. You may also need additional booster doses.  Varicella immunization.** / Consult your caregiver.  Hepatitis A immunization.** / Consult your caregiver. 2 doses, 6 to 18 months apart.  Hepatitis B immunization.** / Consult your caregiver. 3 doses usually over 6 months. Ages 40 to 64  Blood pressure check.** / Every 1 to 2 years.  Lipid and cholesterol check.** / Every 5 years beginning at age 20.  Clinical breast exam.** / Every year after age 40.  Mammogram.** / Every year beginning at age 40   and continuing for as long as you are in good health. Consult with your  caregiver.  Pap test.** / Every 3 years starting at age 30 through age 65 or 70 with a history of 3 consecutive normal Pap tests.  HPV screening.** / Every 3 years from ages 30 through ages 65 to 70 with a history of 3 consecutive normal Pap tests.  Fecal occult blood test (FOBT) of stool. / Every year beginning at age 50 and continuing until age 75. You may not need to do this test if you get a colonoscopy every 10 years.  Flexible sigmoidoscopy or colonoscopy.** / Every 5 years for a flexible sigmoidoscopy or every 10 years for a colonoscopy beginning at age 50 and continuing until age 75.  Hepatitis C blood test.** / For all people born from 1945 through 1965 and any individual with known risks for hepatitis C.  Skin self-exam. / Monthly.  Influenza immunization.** / Every year.  Pneumococcal polysaccharide immunization.** / 1 to 2 doses if you smoke cigarettes or if you have certain chronic medical conditions.  Tetanus, diphtheria, pertussis (Tdap, Td) immunization.** / A one-time dose of Tdap vaccine. After that, you need a Td booster dose every 10 years.  Measles, mumps, rubella (MMR) immunization. / You need at least 1 dose of MMR if you were born in 1957 or later. You may also need a second dose.  Varicella immunization.** / Consult your caregiver.  Meningococcal immunization.** / Consult your caregiver.  Hepatitis A immunization.** / Consult your caregiver. 2 doses, 6 to 18 months apart.  Hepatitis B immunization.** / Consult your caregiver. 3 doses, usually over 6 months. Ages 65 and over  Blood pressure check.** / Every 1 to 2 years.  Lipid and cholesterol check.** / Every 5 years beginning at age 20.  Clinical breast exam.** / Every year after age 40.  Mammogram.** / Every year beginning at age 40 and continuing for as long as you are in good health. Consult with your caregiver.  Pap test.** / Every 3 years starting at age 30 through age 65 or 70 with a 3  consecutive normal Pap tests. Testing can be stopped between 65 and 70 with 3 consecutive normal Pap tests and no abnormal Pap or HPV tests in the past 10 years.  HPV screening.** / Every 3 years from ages 30 through ages 65 or 70 with a history of 3 consecutive normal Pap tests. Testing can be stopped between 65 and 70 with 3 consecutive normal Pap tests and no abnormal Pap or HPV tests in the past 10 years.  Fecal occult blood test (FOBT) of stool. / Every year beginning at age 50 and continuing until age 75. You may not need to do this test if you get a colonoscopy every 10 years.  Flexible sigmoidoscopy or colonoscopy.** / Every 5 years for a flexible sigmoidoscopy or every 10 years for a colonoscopy beginning at age 50 and continuing until age 75.  Hepatitis C blood test.** / For all people born from 1945 through 1965 and any individual with known risks for hepatitis C.  Osteoporosis screening.** / A one-time screening for women ages 65 and over and women at risk for fractures or osteoporosis.  Skin self-exam. / Monthly.  Influenza immunization.** / Every year.  Pneumococcal polysaccharide immunization.** / 1 dose at age 65 (or older) if you have never been vaccinated.  Tetanus, diphtheria, pertussis (Tdap, Td) immunization. / A one-time dose of Tdap vaccine if you are over   65 and have contact with an infant, are a healthcare worker, or simply want to be protected from whooping cough. After that, you need a Td booster dose every 10 years.  Varicella immunization.** / Consult your caregiver.  Meningococcal immunization.** / Consult your caregiver.  Hepatitis A immunization.** / Consult your caregiver. 2 doses, 6 to 18 months apart.  Hepatitis B immunization.** / Check with your caregiver. 3 doses, usually over 6 months. ** Family history and personal history of risk and conditions may change your caregiver's recommendations. Document Released: 02/19/2001 Document Revised: 03/18/2011  Document Reviewed: 05/21/2010 [employer] Patient Information 2014 [employer].  

## 2012-10-15 LAB — HEPATIC FUNCTION PANEL
ALT: 14 U/L (ref 0–35)
Albumin: 4.4 g/dL (ref 3.5–5.2)

## 2012-10-15 LAB — BASIC METABOLIC PANEL
CO2: 26 mEq/L (ref 19–32)
Calcium: 9.8 mg/dL (ref 8.4–10.5)
GFR: 80.9 mL/min (ref >60.00)
Glucose, Bld: 78 mg/dL (ref 70–99)
Potassium: 4 mEq/L (ref 3.5–5.1)
Sodium: 142 mEq/L (ref 135–145)

## 2012-10-15 LAB — LIPID PANEL
Cholesterol: 192 mg/dL (ref 0–200)
HDL: 41.4 mg/dL (ref >39.00)
VLDL: 35.2 mg/dL (ref 0.0–40.0)

## 2012-10-15 LAB — TSH: TSH: 0.67 u[IU]/mL (ref 0.35–5.50)

## 2012-11-13 ENCOUNTER — Encounter: Payer: Self-pay | Admitting: Family Medicine

## 2012-11-13 ENCOUNTER — Ambulatory Visit (INDEPENDENT_AMBULATORY_CARE_PROVIDER_SITE_OTHER): Payer: 59 | Admitting: Family Medicine

## 2012-11-13 VITALS — BP 124/70 | HR 82 | Temp 98.9°F | Wt 207.8 lb

## 2012-11-13 DIAGNOSIS — Z720 Tobacco use: Secondary | ICD-10-CM

## 2012-11-13 DIAGNOSIS — F411 Generalized anxiety disorder: Secondary | ICD-10-CM

## 2012-11-13 DIAGNOSIS — F430 Acute stress reaction: Secondary | ICD-10-CM

## 2012-11-13 DIAGNOSIS — F17200 Nicotine dependence, unspecified, uncomplicated: Secondary | ICD-10-CM

## 2012-11-13 HISTORY — DX: Generalized anxiety disorder: F41.1

## 2012-11-13 MED ORDER — CITALOPRAM HYDROBROMIDE 20 MG PO TABS: 20.0000 mg | ORAL_TABLET | Freq: Every day | ORAL | Status: DC

## 2012-11-13 NOTE — Progress Notes (Signed)
  Subjective:    Patient ID: Jasmine Mclaughlin, female    DOB: 07/08/1950, 62 y.o.   MRN: [id number] Pre-visit discussion using our clinic review tool. No additional management support is needed unless otherwise documented below in the visit note.  HPI Pt here to f/u stress reaction and anger.  She is doing better with the celexa but feels it could be even better.   Review of Systems As above     Objective:   Physical Exam  BP 124/70  Pulse 82  Temp(Src) 98.9 F (37.2 C) (Oral)  Wt 207 lb 12.8 oz (94.257 kg)  SpO2 96% General appearance: alert, cooperative, appears stated age and no distress Neurologic: Alert and oriented X 3, normal strength and tone. Normal symmetric reflexes. Normal coordination and gait      Assessment & Plan:

## 2012-11-13 NOTE — Assessment & Plan Note (Signed)
Samples or nicotrol CQ given to pt with HO

## 2012-11-13 NOTE — Patient Instructions (Signed)
Stress Stress-related medical problems are becoming increasingly common. The body has a built-in physical response to stressful situations. Faced with pressure, challenge or danger, we need to react quickly. Our bodies release hormones such as cortisol and adrenaline to help do this. These hormones are part of the "fight or flight" response and affect the metabolic rate, heart rate and blood pressure, resulting in a heightened, stressed state that prepares the body for optimum performance in dealing with a stressful situation. It is likely that early man required these mechanisms to stay alive, but usually modern stresses do not call for this, and the same hormones released in today's world can damage health and reduce coping ability. CAUSES  Pressure to perform at work, at school or in sports.  Threats of physical violence.  Money worries.  Arguments.  Family conflicts.  Divorce or separation from significant other.  Bereavement.  New job or unemployment.  Changes in location.  Alcohol or drug abuse. SOMETIMES, THERE IS NO PARTICULAR REASON FOR DEVELOPING STRESS. Almost all people are at risk of being stressed at some time in their lives. It is important to know that some stress is temporary and some is long term.  Temporary stress will go away when a situation is resolved. Most people can cope with short periods of stress, and it can often be relieved by relaxing, taking a walk, chatting through issues with friends, or having a good night's sleep.  Chronic (long-term, continuous) stress is much harder to deal with. It can be psychologically and emotionally damaging. It can be harmful both for an individual and for friends and family. SYMPTOMS Everyone reacts to stress differently. There are some common effects that help us recognize it. In times of extreme stress, people may:  Shake uncontrollably.  Breathe faster and deeper than normal (hyperventilate).  Vomit.  For people  with asthma, stress can trigger an attack.  For some people, stress may trigger migraine headaches, ulcers, and body pain. PHYSICAL EFFECTS OF STRESS MAY INCLUDE:  Loss of energy.  Skin problems.  Aches and pains resulting from tense muscles, including neck ache, backache and tension headaches.  Increased pain from arthritis and other conditions.  Irregular heart beat (palpitations).  Periods of irritability or anger.  Apathy or depression.  Anxiety (feeling uptight or worrying).  Unusual behavior.  Loss of appetite.  Comfort eating.  Lack of concentration.  Loss of, or decreased, sex-drive.  Increased smoking, drinking, or recreational drug use.  For women, missed periods.  Ulcers, joint pain, and muscle pain. Post-traumatic stress is the stress caused by any serious accident, strong emotional damage, or extremely difficult or violent experience such as rape or war. Post-traumatic stress victims can experience mixtures of emotions such as fear, shame, depression, guilt or anger. It may include recurrent memories or images that may be haunting. These feelings can last for weeks, months or even years after the traumatic event that triggered them. Specialized treatment, possibly with medicines and psychological therapies, is available. If stress is causing physical symptoms, severe distress or making it difficult for you to function as normal, it is worth seeing your caregiver. It is important to remember that although stress is a usual part of life, extreme or prolonged stress can lead to other illnesses that will need treatment. It is better to visit a doctor sooner rather than later. Stress has been linked to the development of high blood pressure and heart disease, as well as insomnia and depression. There is no diagnostic test for   stress since everyone reacts to it differently. But a caregiver will be able to spot the physical symptoms, such  as:  Headaches.  Shingles.  Ulcers. Emotional distress such as intense worry, low mood or irritability should be detected when the doctor asks pertinent questions to identify any underlying problems that might be the cause. In case there are physical reasons for the symptoms, the doctor may also want to do some tests to exclude certain conditions. If you feel that you are suffering from stress, try to identify the aspects of your life that are causing it. Sometimes you may not be able to change or avoid them, but even a small change can have a positive ripple effect. A simple lifestyle change can make all the difference. STRATEGIES THAT CAN HELP DEAL WITH STRESS:  Delegating or sharing responsibilities.  Avoiding confrontations.  Learning to be more assertive.  Regular exercise.  Avoid using alcohol or street drugs to cope.  Eating a healthy, balanced diet, rich in fruit and vegetables and proteins.  Finding humor or absurdity in stressful situations.  Never taking on more than you know you can handle comfortably.  Organizing your time better to get as much done as possible.  Talking to friends or family and sharing your thoughts and fears.  Listening to music or relaxation tapes.  Tensing and then relaxing your muscles, starting at the toes and working up to the head and neck. If you think that you would benefit from help, either in identifying the things that are causing your stress or in learning techniques to help you relax, see a caregiver who is capable of helping you with this. Rather than relying on medications, it is usually better to try and identify the things in your life that are causing stress and try to deal with them. There are many techniques of managing stress including counseling, psychotherapy, aromatherapy, yoga, and exercise. Your caregiver can help you determine what is best for you. Document Released: 03/16/2002 Document Revised: 03/18/2011 Document  Reviewed: 02/10/2007 Exit[employer] Patient Information 2014 [employer].  

## 2012-11-13 NOTE — Assessment & Plan Note (Signed)
Increase to 20 mg celexa rto 3 months

## 2012-11-23 ENCOUNTER — Other Ambulatory Visit: Payer: Self-pay | Admitting: Dermatology

## 2013-04-13 ENCOUNTER — Encounter: Payer: Self-pay | Admitting: Family Medicine

## 2013-04-13 ENCOUNTER — Ambulatory Visit (INDEPENDENT_AMBULATORY_CARE_PROVIDER_SITE_OTHER): Payer: 59 | Admitting: Family Medicine

## 2013-04-13 VITALS — BP 124/76 | HR 70 | Temp 98.7°F | Wt 204.4 lb

## 2013-04-13 DIAGNOSIS — J329 Chronic sinusitis, unspecified: Secondary | ICD-10-CM

## 2013-04-13 DIAGNOSIS — R062 Wheezing: Secondary | ICD-10-CM

## 2013-04-13 DIAGNOSIS — J209 Acute bronchitis, unspecified: Secondary | ICD-10-CM

## 2013-04-13 DIAGNOSIS — R0602 Shortness of breath: Secondary | ICD-10-CM

## 2013-04-13 MED ORDER — METHYLPREDNISOLONE ACETATE 80 MG/ML IJ SUSP
80.0000 mg | Freq: Once | INTRAMUSCULAR | Status: AC
  Administered 2013-04-13: 80 mg via INTRAMUSCULAR

## 2013-04-13 MED ORDER — PREDNISONE 10 MG PO TABS: ORAL_TABLET | ORAL | Status: DC

## 2013-04-13 MED ORDER — GUAIFENESIN-CODEINE 100-10 MG/5ML PO SYRP: ORAL_SOLUTION | ORAL | Status: DC

## 2013-04-13 MED ORDER — ALBUTEROL SULFATE (2.5 MG/3ML) 0.083% IN NEBU
2.5000 mg | INHALATION_SOLUTION | Freq: Once | RESPIRATORY_TRACT | Status: AC
  Administered 2013-04-13: 2.5 mg via RESPIRATORY_TRACT

## 2013-04-13 MED ORDER — AMOXICILLIN-POT CLAVULANATE 875-125 MG PO TABS: 1.0000 | ORAL_TABLET | Freq: Two times a day (BID) | ORAL | Status: DC

## 2013-04-13 NOTE — Progress Notes (Signed)
  Subjective:     Jasmine Mclaughlin is a 63 y.o. female who presents for evaluation of sinus pain. Symptoms include: congestion, facial pain, headaches, nasal congestion, purulent rhinorrhea and sinus pressure. Onset of symptoms was 1 week ago. Symptoms have been gradually worsening since that time. Past history is significant for no history of pneumonia or bronchitis. Patient is a smoker.    The following portions of the patient's history were reviewed and updated as appropriate: allergies, current medications, past family history, past medical history, past social history, past surgical history and problem list.  Review of Systems Pertinent items are noted in HPI.   Objective:    BP 124/76  Pulse 70  Temp(Src) 98.7 F (37.1 C) (Oral)  Wt 204 lb 6.4 oz (92.715 kg)  SpO2 96% General appearance: alert, cooperative, appears stated age and no distress Ears: normal TM's and external ear canals both ears Nose: Nares normal. Septum midline. Mucosa normal. No drainage or sinus tenderness., green discharge, moderate congestion, turbinates red, swollen, sinus tenderness bilateral Throat: lips, mucosa, and tongue normal; teeth and gums normal Neck: mild anterior cervical adenopathy, supple, symmetrical, trachea midline and thyroid not enlarged, symmetric, no tenderness/mass/nodules Lungs: rhonchi bilaterally and wheezes bilaterally-- after neb--still some wheezing with exp Heart: S1, S2 normal    Assessment:    Acute bacterial sinusitis.   Bronchitis  Plan:  pred taper, depo medrol  Nasal steroids per medication orders. Antihistamines per medication orders. Augmentin per medication orders.

## 2013-04-13 NOTE — Patient Instructions (Signed)

## 2013-04-13 NOTE — Addendum Note (Signed)
Addended by: Rudene Anda on: 04/13/2013 04:09 PM   Modules accepted: Orders

## 2013-04-13 NOTE — Progress Notes (Signed)
Pre visit review using our clinic review tool, if applicable. No additional management support is needed unless otherwise documented below in the visit note. 

## 2013-04-14 ENCOUNTER — Telehealth: Payer: Self-pay | Admitting: Family Medicine

## 2013-04-14 NOTE — Telephone Encounter (Signed)
Relevant patient education assigned to patient using Emmi. ° °

## 2013-05-13 ENCOUNTER — Other Ambulatory Visit: Payer: Self-pay | Admitting: Family Medicine

## 2013-05-13 ENCOUNTER — Encounter: Payer: Self-pay | Admitting: Family Medicine

## 2013-05-13 DIAGNOSIS — M549 Dorsalgia, unspecified: Secondary | ICD-10-CM

## 2013-05-13 MED ORDER — CYCLOBENZAPRINE HCL 5 MG PO TABS: 5.0000 mg | ORAL_TABLET | Freq: Three times a day (TID) | ORAL | Status: DC | PRN

## 2013-05-13 NOTE — Telephone Encounter (Signed)
Last seen 04/13/13 and filled 10/14/12 #30 with 2 refills. Please advise    KP

## 2013-06-16 ENCOUNTER — Encounter: Payer: Self-pay | Admitting: Family Medicine

## 2013-06-16 ENCOUNTER — Other Ambulatory Visit: Payer: Self-pay

## 2013-06-16 ENCOUNTER — Other Ambulatory Visit: Payer: Self-pay | Admitting: Family Medicine

## 2013-06-16 DIAGNOSIS — F430 Acute stress reaction: Secondary | ICD-10-CM

## 2013-06-16 MED ORDER — CITALOPRAM HYDROBROMIDE 20 MG PO TABS: 20.0000 mg | ORAL_TABLET | Freq: Every day | ORAL | Status: DC

## 2013-09-17 ENCOUNTER — Encounter: Payer: Self-pay | Admitting: Internal Medicine

## 2013-10-05 ENCOUNTER — Other Ambulatory Visit: Payer: Self-pay

## 2013-10-05 DIAGNOSIS — Z1231 Encounter for screening mammogram for malignant neoplasm of breast: Secondary | ICD-10-CM

## 2013-10-06 ENCOUNTER — Encounter: Payer: Self-pay | Admitting: Family Medicine

## 2013-10-07 ENCOUNTER — Other Ambulatory Visit: Payer: Self-pay | Admitting: Family Medicine

## 2013-10-07 DIAGNOSIS — M5442 Lumbago with sciatica, left side: Secondary | ICD-10-CM

## 2013-10-07 DIAGNOSIS — M5440 Lumbago with sciatica, unspecified side: Secondary | ICD-10-CM

## 2013-10-07 MED ORDER — CYCLOBENZAPRINE HCL 5 MG PO TABS: 5.0000 mg | ORAL_TABLET | Freq: Three times a day (TID) | ORAL | Status: DC | PRN

## 2013-10-07 NOTE — Telephone Encounter (Signed)
Last seen 05/03/13 and filled 05/13/13 #30 with 1 refill. Please advise     KP

## 2013-10-08 ENCOUNTER — Ambulatory Visit
Admission: RE | Admit: 2013-10-08 | Discharge: 2013-10-08 | Disposition: A | Discharge status: Home / Self Care | Payer: 59 | Source: Ambulatory Visit

## 2013-10-08 DIAGNOSIS — Z1231 Encounter for screening mammogram for malignant neoplasm of breast: Secondary | ICD-10-CM

## 2013-10-08 IMAGING — MG MM SCREEN MAMMOGRAM BILATERAL
6 series · 6 of 6 positions shown · non-contrast
Comparison: Previous exam(s).

CLINICAL DATA: Screening.

EXAM:
DIGITAL SCREENING BILATERAL MAMMOGRAM WITH CAD

[R CC]
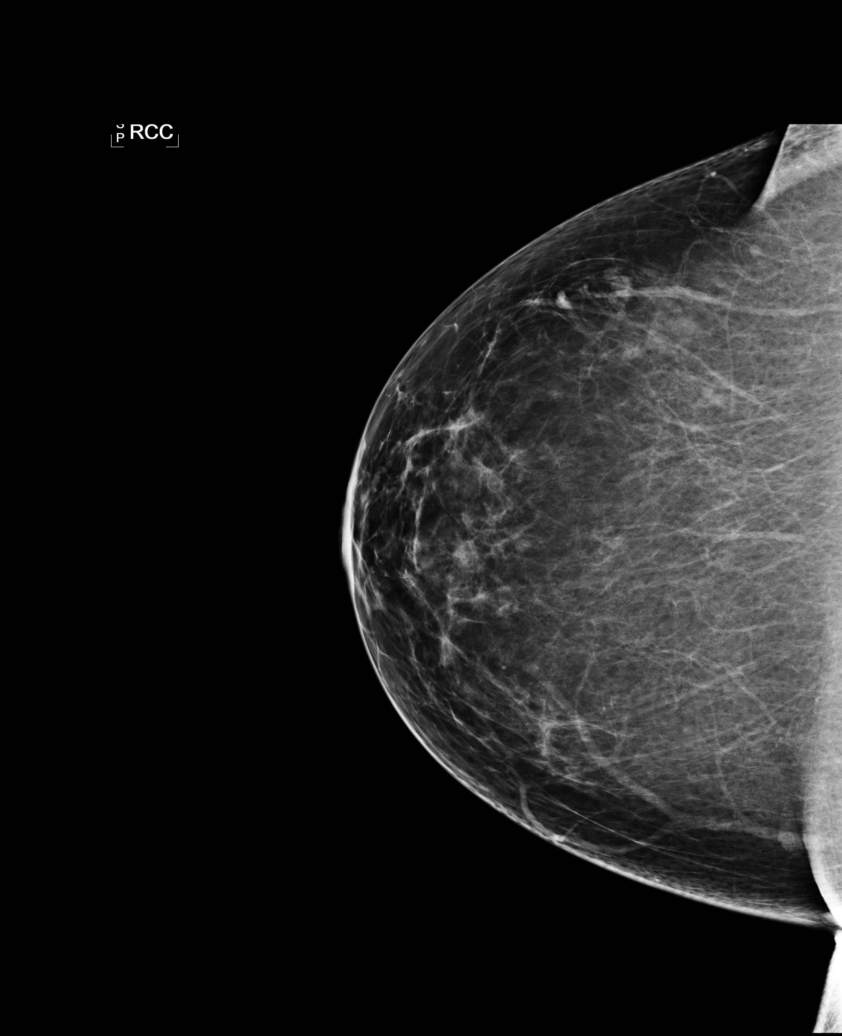

[L CC]
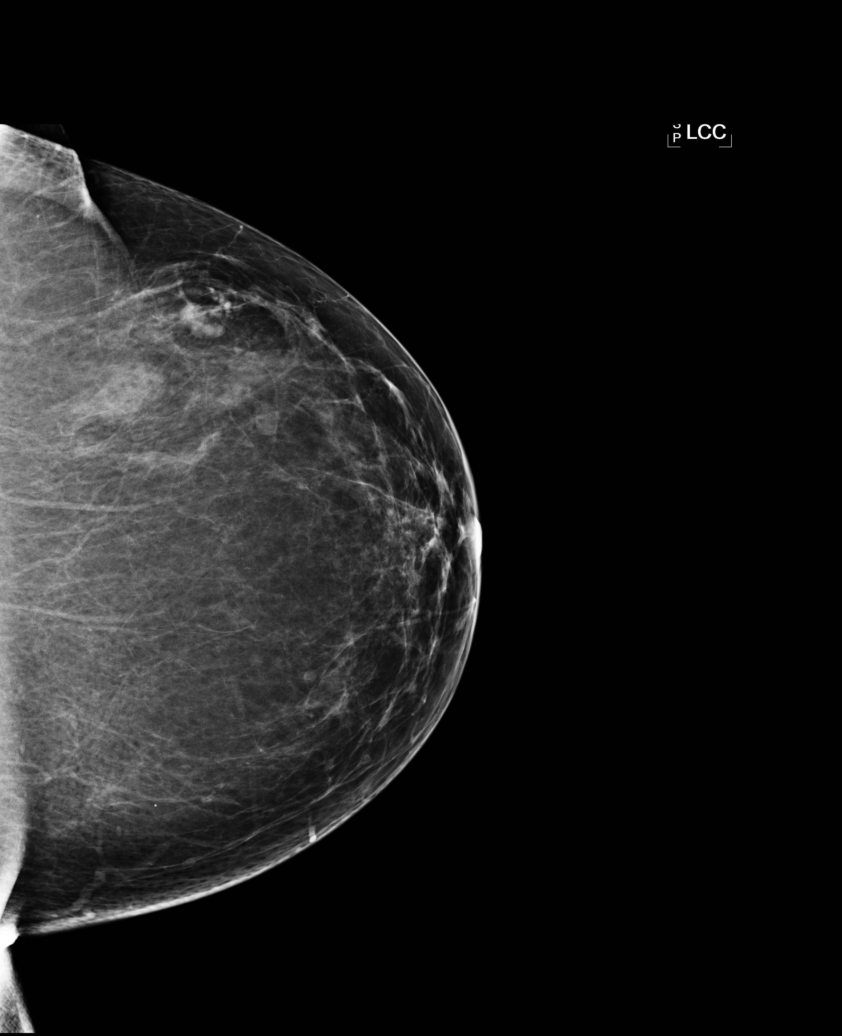

[L MLO]
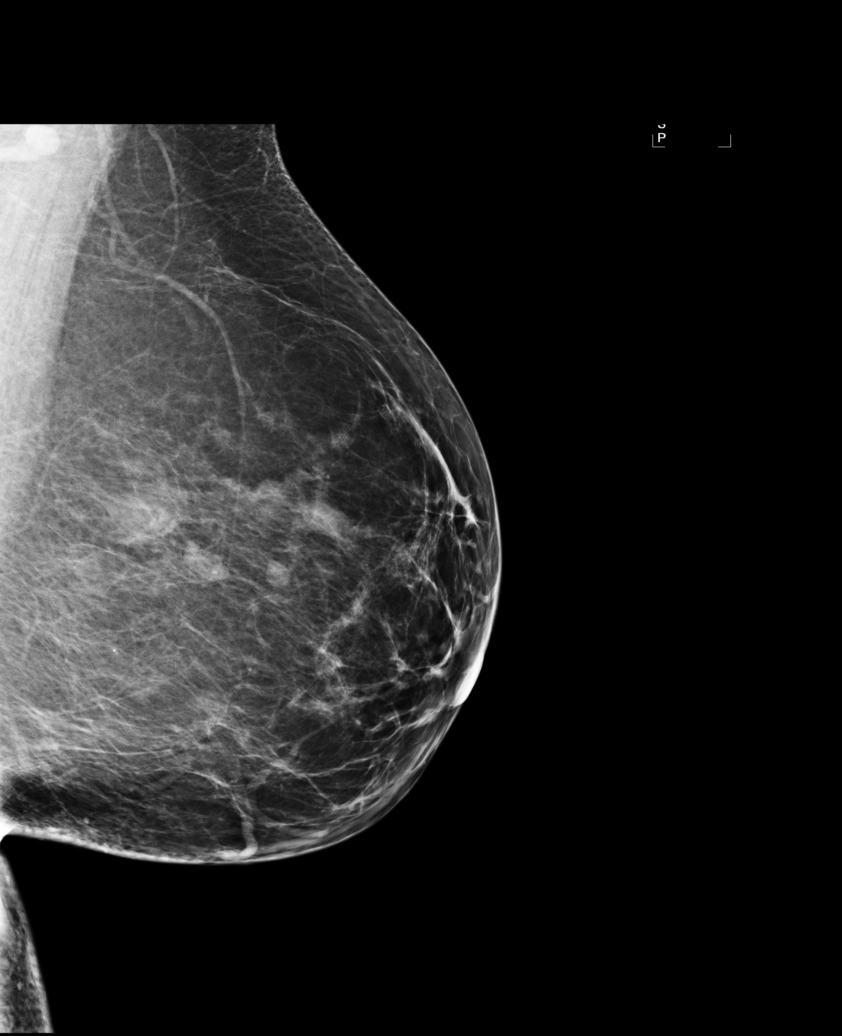

[R MLO (1 of 2)]
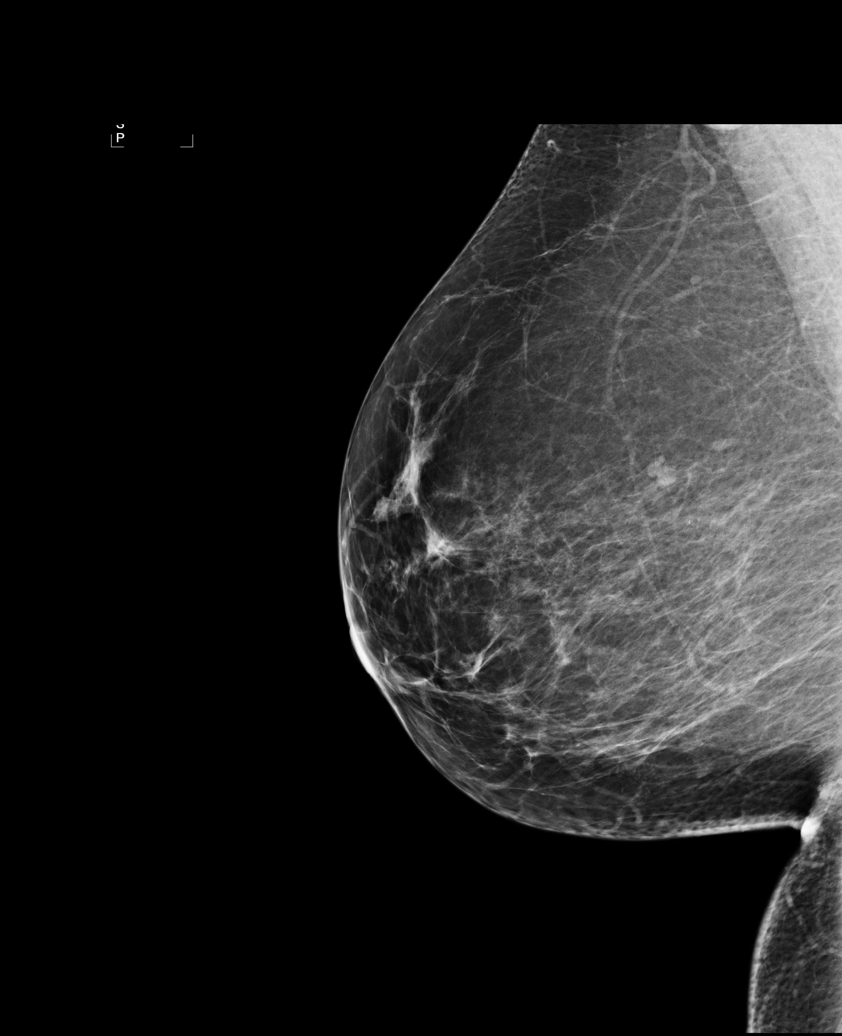

[L XCCL]
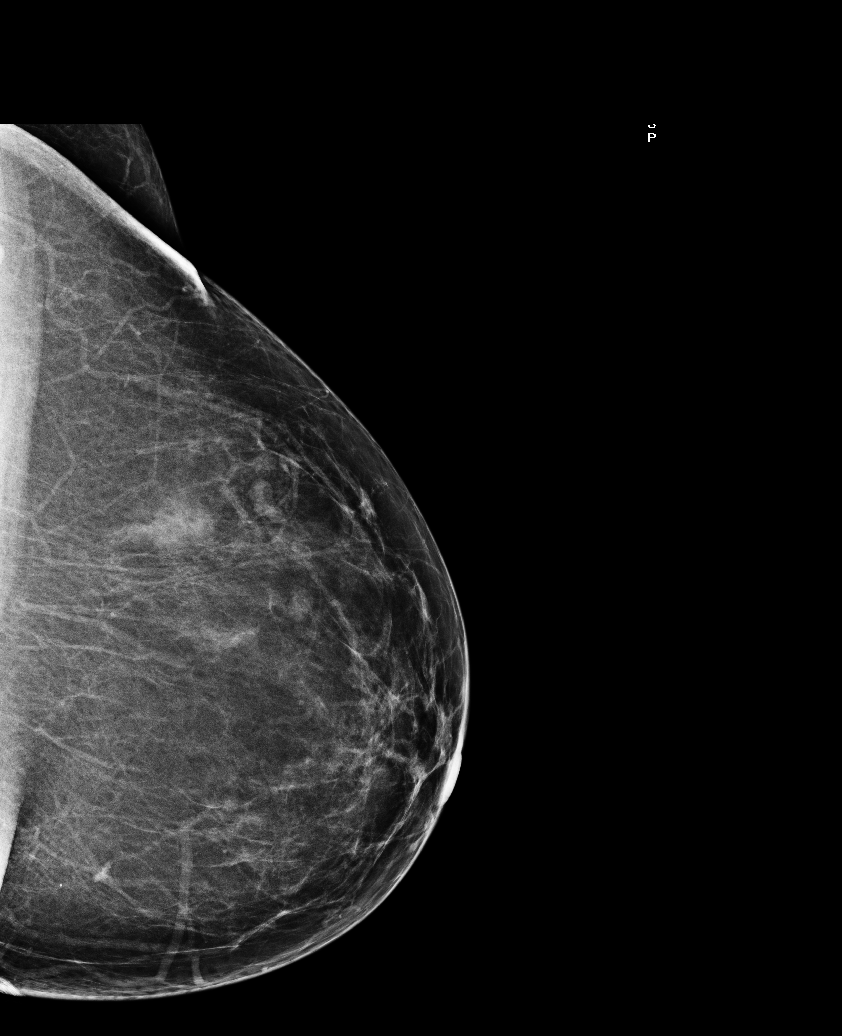

[R MLO (2 of 2)]
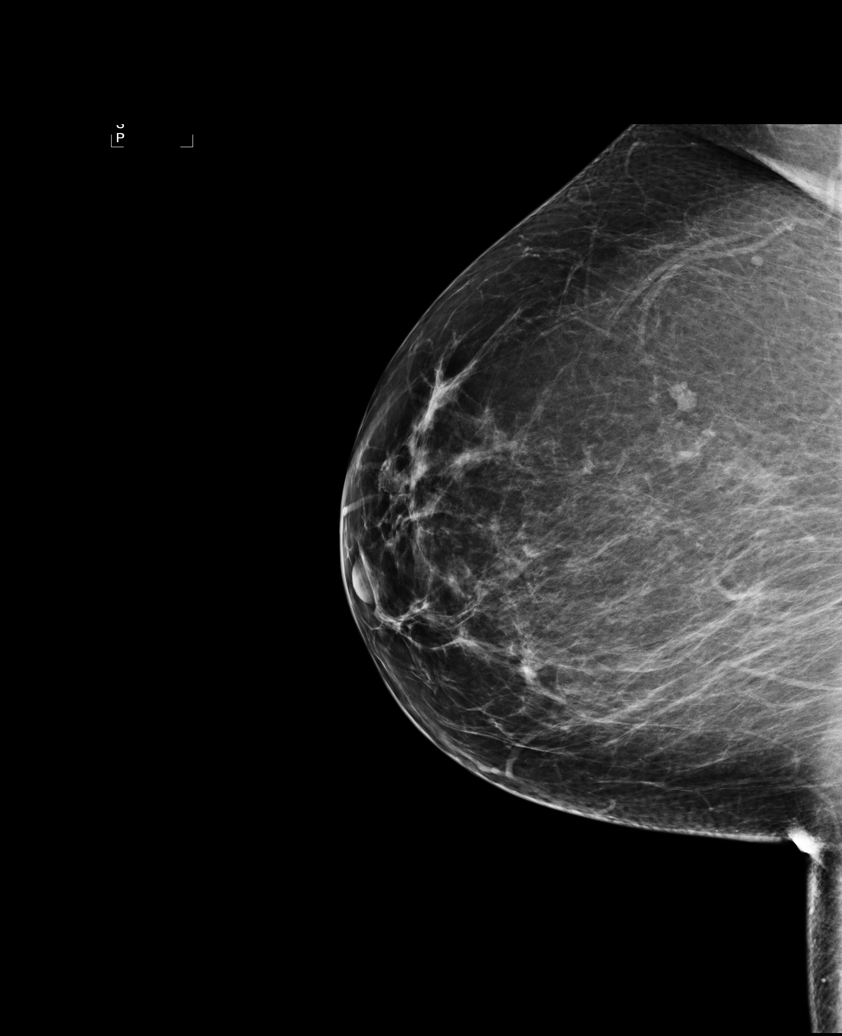

[6 of 6 positions shown; findings below may reference images not displayed]

ACR Breast Density Category b: There are scattered areas of
fibroglandular density.
FINDINGS: There are no findings suspicious for malignancy. Images were
processed with CAD.
IMPRESSION: No mammographic evidence of malignancy. A result letter of this
screening mammogram will be mailed directly to the patient.

RECOMMENDATION:
Screening mammogram in one year. (Code:[US])

BI-RADS CATEGORY  1: Negative.

## 2013-10-27 ENCOUNTER — Encounter: Payer: Self-pay | Admitting: Medical

## 2013-10-27 ENCOUNTER — Ambulatory Visit (INDEPENDENT_AMBULATORY_CARE_PROVIDER_SITE_OTHER): Payer: 59 | Admitting: Medical

## 2013-10-27 VITALS — BP 153/78 | HR 68 | Temp 98.7°F | Ht 65.3 in | Wt 204.2 lb

## 2013-10-27 DIAGNOSIS — R0789 Other chest pain: Secondary | ICD-10-CM | POA: Insufficient documentation

## 2013-10-27 DIAGNOSIS — J019 Acute sinusitis, unspecified: Secondary | ICD-10-CM

## 2013-10-27 DIAGNOSIS — R062 Wheezing: Secondary | ICD-10-CM | POA: Insufficient documentation

## 2013-10-27 DIAGNOSIS — J0100 Acute maxillary sinusitis, unspecified: Secondary | ICD-10-CM

## 2013-10-27 HISTORY — DX: Acute sinusitis, unspecified: J01.90

## 2013-10-27 HISTORY — DX: Wheezing: R06.2

## 2013-10-27 HISTORY — DX: Other chest pain: R07.89

## 2013-10-27 MED ORDER — CEFDINIR 300 MG PO CAPS: 300.0000 mg | ORAL_CAPSULE | Freq: Two times a day (BID) | ORAL | Status: DC

## 2013-10-27 MED ORDER — METHYLPREDNISOLONE ACETATE 80 MG/ML IJ SUSP
80.0000 mg | Freq: Once | INTRAMUSCULAR | Status: AC
  Administered 2013-10-27: 80 mg via INTRAMUSCULAR

## 2013-10-27 MED ORDER — BECLOMETHASONE DIPROPIONATE 40 MCG/ACT IN AERS: 2.0000 | INHALATION_SPRAY | Freq: Two times a day (BID) | RESPIRATORY_TRACT | Status: DC

## 2013-10-27 MED ORDER — ALBUTEROL SULFATE HFA 108 (90 BASE) MCG/ACT IN AERS: 2.0000 | INHALATION_SPRAY | Freq: Four times a day (QID) | RESPIRATORY_TRACT | Status: DC | PRN

## 2013-10-27 MED ORDER — PREDNISONE 20 MG PO TABS: 20.0000 mg | ORAL_TABLET | Freq: Three times a day (TID) | ORAL | Status: DC

## 2013-10-27 NOTE — Assessment & Plan Note (Signed)
Likely rad related to allergies and hx of smoking. Oral prednisone, qvar, albuterol inhaler. If symptoms worsen indicating sinusitis or bronchits then start cefdinir.

## 2013-10-27 NOTE — Assessment & Plan Note (Addendum)
Likley rad related. Neg homans sign. EKG looked ok. Nevertheless did advise if chest symptoms worsen or if her back muscle soreness worsens then ED eval. Pt expressed understanding.  Smoking cessation would help/is needed.

## 2013-10-27 NOTE — Progress Notes (Signed)
Pre visit review using our clinic review tool, if applicable. No additional management support is needed unless otherwise documented below in the visit note. 

## 2013-10-27 NOTE — Progress Notes (Signed)
Subjective:    Patient ID: Jasmine Mclaughlin, female    DOB: [birth year], 63 y.o.   MRN: [id number]  HPI  Pt in states with severe nasal congestion. Feels pnd. Pt has no sneezing or itching eyes. Slight mild sore. No family members sick with same symptoms. Pt has some history of ragweed allergies in the fall.  Pt is wheezing. She has history of asthma and some bronchitis. Pt does smoke about 5 cigarettes a day. Smoking since 63 years old. Pt feels like can't take full deep breath. Pt denies any chest pain. No history of any heart issues per pt.  Pt does not have any inhalers.   Pt does have some soreness mid back between scapula(Area tender to touch)  Past Medical History  Diagnosis Date  . Jasmine polyps   . Genital warts   . Asthma   . Shortness of breath     pt./ reports that she is out of shape  . Arthritis     degenerative back, joint disturbance "everywhere "    History   Social History  . Marital Status: Single    Spouse Name: N/A    Number of Children: 1  . Years of Education: N/A   Occupational History  . [employer]     sits in front of computer   Social History Main Topics  . Smoking status: Current Every Day Smoker -- 0.70 packs/day for 45 years    Types: Cigarettes  . Smokeless tobacco: Never Used     Comment: she is thinking about it   . Alcohol Use: Yes     Comment: 2 times per month  . Drug Use: No  . Sexual Activity: Yes    Partners: Male   Other Topics Concern  . Not on file   Social History Narrative   1 caffeine drinks daily     Past Surgical History  Procedure Laterality Date  . Knee arthroscopy       R knee 01/2012, for ligament tears  . Tonsillectomy    . Vaginal delivery      x1  . Anterior fusion lumbar spine  05/06/2012  . Anterior lat lumbar fusion N/A 05/06/2012    Procedure: ANTERIOR LATERAL LUMBAR FUSION 3 LEVELS;  Surgeon: Sinclair Ship, MD;  Location: [place];  Service: Orthopedics;  Laterality: N/A;  Lateral interbody fusion,  lumbar 2-3,lumbar 3-4, lumbar 4-5 with instrumentation and allograft.    Family History  Problem Relation Age of Onset  . Arthritis Mother   . Arthritis Father   . Stroke Father   . Jasmine cancer Father 40  . Heart disease Father 31    chf  . Breast cancer Maternal Grandmother     No Known Allergies  Current Outpatient Prescriptions on File Prior to Visit  Medication Sig Dispense Refill  . acetaminophen (TYLENOL) 650 MG CR tablet Take 650 mg by mouth every 8 (eight) hours as needed for pain.      . Calcium Carbonate (CALCIUM 600 PO) Take 1,800 mg by mouth daily.      . celecoxib (CELEBREX) 200 MG capsule Take 1 capsule (200 mg total) by mouth 2 (two) times daily.  30 capsule  11  . citalopram (CELEXA) 20 MG tablet Take 1 tablet (20 mg total) by mouth daily.  90 tablet  1  . cyclobenzaprine (FLEXERIL) 5 MG tablet Take 1 tablet (5 mg total) by mouth 3 (three) times daily as needed for muscle spasms.  30 tablet  1  . diphenhydrAMINE (BENADRYL) 50 MG capsule Take 50 mg by mouth at bedtime as needed for sleep.       Marland Kitchen docusate sodium (COLACE) 100 MG capsule Take 100 mg by mouth 3 (three) times daily as needed for constipation.      Marland Kitchen guaiFENesin-codeine (ROBITUSSIN AC) 100-10 MG/5ML syrup 1-2 tsp po qhs prn cough  120 mL  0  . VITAMIN D, CHOLECALCIFEROL, PO Take 1,200 mg by mouth daily.      Marland Kitchen amoxicillin-clavulanate (AUGMENTIN) 875-125 MG per tablet Take 1 tablet by mouth 2 (two) times daily.  20 tablet  0   No current facility-administered medications on file prior to visit.    BP 153/78  Pulse 68  Temp(Src) 98.7 F (37.1 C) (Oral)  Ht 5' 5.3" (1.659 m)  Wt 204 lb 3.2 oz (92.625 kg)  BMI 33.65 kg/m2  SpO2 96%      Review of Systems  Constitutional: Positive for fatigue. Negative for fever and chills.  HENT: Positive for congestion and sinus pressure. Negative for drooling, ear pain, sneezing and sore throat.   Respiratory: Positive for cough, chest tightness, shortness of  breath and wheezing. Negative for apnea, choking and stridor.   Cardiovascular: Negative for chest pain, palpitations and leg swelling.  Gastrointestinal: Negative.   Genitourinary: Negative.   Musculoskeletal: Positive for back pain.       Some soreness on palpation between her scapulas.  Neurological: Negative.   Hematological: Negative for adenopathy. Does not bruise/bleed easily.  Psychiatric/Behavioral: Negative.        Objective:   Physical Exam  General  Mental Status - Alert. General Appearance - Well groomed. Not in acute distress.  Skin Rashes- No Rashes.  HEENT Head- Normal. Ear Auditory Canal - Left- Normal. Right - Normal.Tympanic Membrane- Left- Normal. Right- Normal. Eye Sclera/Conjunctiva- Left- Normal. Right- Normal. Nose & Sinuses Nasal Mucosa- Left-  Boggy + Congested. Right-  Boggy + Congested. Faint maxillary sinus pressure. Mouth & Throat Lips: Upper Lip- Normal: no dryness, cracking, pallor, cyanosis, or vesicular eruption. Lower Lip-Normal: no dryness, cracking, pallor, cyanosis or vesicular eruption. Buccal Mucosa- Bilateral- No Aphthous ulcers. Oropharynx- No Discharge or Erythema. Tonsils: Characteristics- Bilateral- No Erythema or Congestion. Size/Enlargement- Bilateral- No enlargement. Discharge- bilateral-None.  Neck Neck- Supple. No Masses.   Chest and Lung Exam Auscultation: Breath Sounds:-Normal. Clear even and unlabored but shallow.  Cardiovascular Auscultation:Rythm- Regular, rate and rhythm. Murmurs & Other Heart Sounds:Ausculatation of the heart reveal- No Murmurs.  Lymphatic Head & Neck General Head & Neck Lymphatics: Bilateral: Description- No Localized lymphadenopathy.  Lower ext- negative homans signs. Calfs are not swollen. They are symmetric  Back- on palpation of upper back between scapula area is sore.         Assessment & Plan:

## 2013-10-27 NOTE — Assessment & Plan Note (Addendum)
With possible allergic component. Start cefdinir and steroid nasal spray.

## 2013-10-27 NOTE — Patient Instructions (Signed)
Your appear to have recent allergic rhinitis and subsequent reactive airways. Your history of smoking likely is a big factor in recent illness. I am prescribing short course oral prednsione, qvar inhaler, and albuterol inhaler. Rx of steroid nasal spray. If sinus pressure worsens or productive cough then start azithromycin.  We exercised caution today getting ekg. Your symptoms of chest tightness(as if not able to take full deep breath) likely from constricted airways. Your back muscles are little sore to palpation. Your ekg looks ok today. However if your chest symptoms worsen or more back pain then strongly advise ED evaluation.  Follow up in 7 days or as needed

## 2013-11-08 ENCOUNTER — Telehealth: Payer: Self-pay | Admitting: *Deleted

## 2013-11-08 NOTE — Telephone Encounter (Signed)
Received FMLA paperwork via fax. Forms filled out as much as possible and forwarded to [facility] for completion. JG//CMA

## 2013-11-11 ENCOUNTER — Telehealth: Payer: Self-pay | Admitting: Family Medicine

## 2013-11-11 NOTE — Telephone Encounter (Signed)
Pt is following up on FMLA paperwork that was faxed last week. This is dr. Etter Sjogren pt.

## 2013-11-11 NOTE — Telephone Encounter (Signed)
Forms are currently with Jasmine Mclaughlin. JG//CMA

## 2013-11-22 ENCOUNTER — Encounter: Payer: Self-pay | Admitting: Medical

## 2013-11-22 ENCOUNTER — Ambulatory Visit (INDEPENDENT_AMBULATORY_CARE_PROVIDER_SITE_OTHER): Payer: 59 | Admitting: Medical

## 2013-11-22 VITALS — BP 122/74 | HR 65 | Temp 98.7°F | Ht 65.3 in | Wt 206.6 lb

## 2013-11-22 DIAGNOSIS — J449 Chronic obstructive pulmonary disease, unspecified: Secondary | ICD-10-CM | POA: Insufficient documentation

## 2013-11-22 MED ORDER — ALBUTEROL SULFATE HFA 108 (90 BASE) MCG/ACT IN AERS: 2.0000 | INHALATION_SPRAY | Freq: Four times a day (QID) | RESPIRATORY_TRACT | Status: DC | PRN

## 2013-11-22 MED ORDER — BECLOMETHASONE DIPROPIONATE 40 MCG/ACT IN AERS: 2.0000 | INHALATION_SPRAY | Freq: Two times a day (BID) | RESPIRATORY_TRACT | Status: DC

## 2013-11-22 NOTE — Progress Notes (Signed)
Subjective:    Patient ID: Jasmine Mclaughlin, female    DOB: 11-23-1950, 63 y.o.   MRN: [id number]  HPI   Pt came in twice past year one time in spring and another time in October. Pt states over last year wheezing more frequently. Daily basis when gets up in am wheezing. Before this year always a couple of bouts of bronchitis with wheezing per year.  Last time I saw her she did not get better quickly. Pt is getting more easily winded when she walks. Pt has been smoking since 63 years old. Pt did quit one time for 5years and that time quit cold [place] about 10 years ago. Pt does accounting type duty.  Pt does have inhaler available. Use qvar daily. And albuterol about 3 times a week.  Past Medical History  Diagnosis Date  . Jasmine polyps   . Genital warts   . Asthma   . Shortness of breath     pt./ reports that she is out of shape  . Arthritis     degenerative back, joint disturbance "everywhere "    History   Social History  . Marital Status: Single    Spouse Name: N/A    Number of Children: 1  . Years of Education: N/A   Occupational History  . [employer]     sits in front of computer   Social History Main Topics  . Smoking status: Current Every Day Smoker -- 0.70 packs/day for 45 years    Types: Cigarettes  . Smokeless tobacco: Never Used     Comment: she is thinking about it   . Alcohol Use: Yes     Comment: 2 times per month  . Drug Use: No  . Sexual Activity:    Partners: Male   Other Topics Concern  . Not on file   Social History Narrative   1 caffeine drinks daily     Past Surgical History  Procedure Laterality Date  . Knee arthroscopy       R knee 01/2012, for ligament tears  . Tonsillectomy    . Vaginal delivery      x1  . Anterior fusion lumbar spine  05/06/2012  . Anterior lat lumbar fusion N/A 05/06/2012    Procedure: ANTERIOR LATERAL LUMBAR FUSION 3 LEVELS;  Surgeon: Sinclair Ship, MD;  Location: [place];  Service: Orthopedics;  Laterality: N/A;   Lateral interbody fusion, lumbar 2-3,lumbar 3-4, lumbar 4-5 with instrumentation and allograft.    Family History  Problem Relation Age of Onset  . Arthritis Mother   . Arthritis Father   . Stroke Father   . Jasmine cancer Father 5  . Heart disease Father 4    chf  . Breast cancer Maternal Grandmother     No Known Allergies  Current Outpatient Prescriptions on File Prior to Visit  Medication Sig Dispense Refill  . acetaminophen (TYLENOL) 650 MG CR tablet Take 650 mg by mouth every 8 (eight) hours as needed for pain.    Marland Kitchen amoxicillin-clavulanate (AUGMENTIN) 875-125 MG per tablet Take 1 tablet by mouth 2 (two) times daily. 20 tablet 0  . Calcium Carbonate (CALCIUM 600 PO) Take 1,800 mg by mouth daily.    . cefdinir (OMNICEF) 300 MG capsule Take 1 capsule (300 mg total) by mouth 2 (two) times daily. 20 capsule 0  . celecoxib (CELEBREX) 200 MG capsule Take 1 capsule (200 mg total) by mouth 2 (two) times daily. 30 capsule 11  . citalopram (  CELEXA) 20 MG tablet Take 1 tablet (20 mg total) by mouth daily. 90 tablet 1  . cyclobenzaprine (FLEXERIL) 5 MG tablet Take 1 tablet (5 mg total) by mouth 3 (three) times daily as needed for muscle spasms. 30 tablet 1  . diphenhydrAMINE (BENADRYL) 50 MG capsule Take 50 mg by mouth at bedtime as needed for sleep.     Marland Kitchen docusate sodium (COLACE) 100 MG capsule Take 100 mg by mouth 3 (three) times daily as needed for constipation.    Marland Kitchen guaiFENesin-codeine (ROBITUSSIN AC) 100-10 MG/5ML syrup 1-2 tsp po qhs prn cough 120 mL 0  . predniSONE (DELTASONE) 20 MG tablet Take 1 tablet (20 mg total) by mouth 3 (three) times daily after meals. 9 tablet 0  . VITAMIN D, CHOLECALCIFEROL, PO Take 1,200 mg by mouth daily.     No current facility-administered medications on file prior to visit.    BP 122/74 mmHg  Pulse 65  Temp(Src) 98.7 F (37.1 C) (Oral)  Ht 5' 5.3" (1.659 m)  Wt 206 lb 9.6 oz (93.713 kg)  BMI 34.05 kg/m2  SpO2 96%       Review of Systems    Constitutional: Negative for fever, chills and fatigue.  HENT: Negative for congestion, ear discharge, ear pain, nosebleeds, postnasal drip, rhinorrhea, sinus pressure, sore throat and trouble swallowing.   Respiratory: Positive for shortness of breath and wheezing. Negative for cough and chest tightness.        Daily in the am. Some on ambulating.  Cardiovascular: Negative for chest pain and palpitations.  Gastrointestinal: Negative for nausea, vomiting, abdominal pain, diarrhea and constipation.  Genitourinary: Negative for dysuria and flank pain.  Musculoskeletal: Negative for back pain.  Neurological: Negative for dizziness, tremors, seizures, syncope, weakness, light-headedness, numbness and headaches.  Hematological: Negative for adenopathy. Does not bruise/bleed easily.  Psychiatric/Behavioral: Negative for suicidal ideas, behavioral problems and dysphoric mood. The patient is not nervous/anxious.        Objective:   Physical Exam  General  Mental Status - Alert. General Appearance - Well groomed. Not in acute distress.  Skin Rashes- No Rashes.  HEENT Head- Normal. Ear Auditory Canal - Left- Normal. Right - Normal.Tympanic Membrane- Left- Normal. Right- Normal. Eye Sclera/Conjunctiva- Left- Normal. Right- Normal. Nose & Sinuses Nasal Mucosa- Left- Not Boggy or Congested. Right- Not Boggy or Congested. Mouth & Throat Lips: Upper Lip- Normal: no dryness, cracking, pallor, cyanosis, or vesicular eruption. Lower Lip-Normal: no dryness, cracking, pallor, cyanosis or vesicular eruption. Buccal Mucosa- Bilateral- No Aphthous ulcers. Oropharynx- No Discharge or Erythema. Tonsils: Characteristics- Bilateral- No Erythema or Congestion. Size/Enlargement- Bilateral- No enlargement. Discharge- bilateral-None.  Neck Neck- Supple. No Masses.   Chest and Lung Exam Auscultation: Breath Sounds:-clear, and even but mild labored pattern and looks little  sob.  Cardiovascular Auscultation:Rythm- Regular, Rate and rhythm. Murmurs & Other Heart Sounds:Ausculatation of the heart reveal- No Murmurs.  Lymphatic Head & Neck General Head & Neck Lymphatics: Bilateral: Description- No Localized lymphadenopathy.        Assessment & Plan:

## 2013-11-22 NOTE — Progress Notes (Signed)
Pre visit review using our clinic review tool, if applicable. No additional management support is needed unless otherwise documented below in the visit note. 

## 2013-11-22 NOTE — Patient Instructions (Addendum)
I did fill out your flma form today.( I did give it to staff to fax in and to make a copy for our records.)  I am refilling your qvar and albuterol. Will give 4 refills for both.  I will give go ahead and refer you to pulmonology sometime past January(at your request)  Follow up 4 months or as needed

## 2013-11-22 NOTE — Assessment & Plan Note (Addendum)
Possible modertate copd(But with occasional excacerbations that could keep her out of work). My second time seeing her but she seems to be some short of breath speaking with her and she describe some progressive worsnening over past year. I will go ahead and refer her to pulmonologist. She will continue on her qvar and albuterol.

## 2013-11-22 NOTE — Telephone Encounter (Signed)
Forms faxed to [place]. JG//CMA

## 2013-12-17 ENCOUNTER — Encounter: Payer: Self-pay | Admitting: Family Medicine

## 2013-12-17 DIAGNOSIS — M545 Low back pain: Secondary | ICD-10-CM

## 2013-12-17 DIAGNOSIS — F439 Reaction to severe stress, unspecified: Secondary | ICD-10-CM

## 2013-12-17 DIAGNOSIS — M5440 Lumbago with sciatica, unspecified side: Secondary | ICD-10-CM

## 2013-12-17 MED ORDER — CITALOPRAM HYDROBROMIDE 20 MG PO TABS: 20.0000 mg | ORAL_TABLET | Freq: Every day | ORAL | Status: DC

## 2013-12-17 MED ORDER — CELEBREX 200 MG PO CAPS
200.0000 mg | ORAL_CAPSULE | Freq: Two times a day (BID) | ORAL | Status: DC
Start: 2013-12-17 — End: 2014-02-15

## 2013-12-17 MED ORDER — CYCLOBENZAPRINE HCL 5 MG PO TABS
5.0000 mg | ORAL_TABLET | Freq: Three times a day (TID) | ORAL | Status: DC | PRN
Start: 2013-12-17 — End: 2014-02-15

## 2013-12-20 ENCOUNTER — Encounter: Payer: 59 | Admitting: Family Medicine

## 2014-01-18 ENCOUNTER — Institutional Professional Consult (permissible substitution): Payer: 59 | Admitting: Internal Medicine

## 2014-01-24 ENCOUNTER — Encounter: Payer: Self-pay | Admitting: Physician Assistant

## 2014-01-24 ENCOUNTER — Institutional Professional Consult (permissible substitution): Payer: 59 | Admitting: Pulmonary Disease

## 2014-01-24 ENCOUNTER — Ambulatory Visit (INDEPENDENT_AMBULATORY_CARE_PROVIDER_SITE_OTHER): Payer: 59 | Admitting: Physician Assistant

## 2014-01-24 VITALS — BP 127/63 | HR 77 | Temp 98.6°F | Resp 16 | Ht 64.0 in | Wt 207.1 lb

## 2014-01-24 DIAGNOSIS — J019 Acute sinusitis, unspecified: Secondary | ICD-10-CM

## 2014-01-24 DIAGNOSIS — B9689 Other specified bacterial agents as the cause of diseases classified elsewhere: Secondary | ICD-10-CM

## 2014-01-24 MED ORDER — AMOXICILLIN-POT CLAVULANATE 875-125 MG PO TABS: 1.0000 | ORAL_TABLET | Freq: Two times a day (BID) | ORAL | Status: DC

## 2014-01-24 NOTE — Progress Notes (Signed)
History of Present Illness: Jasmine Mclaughlin is a 64 y.o. female who present to the clinic today complaining of sinus pressure, sinus pain and nasal congestion. Patient endorses low-grade fever..  Patient denies cough, pleuritic chest pain or SOB.  Some facial pain today. Endorses symptoms are progressively worsening.  History: Past Medical History  Diagnosis Date  . Colon polyps   . Genital warts   . Asthma   . Shortness of breath     pt./ reports that she is out of shape  . Arthritis     degenerative back, joint disturbance "everywhere "    Current outpatient prescriptions:  .  acetaminophen (TYLENOL) 650 MG CR tablet, Take 650 mg by mouth every 8 (eight) hours as needed for pain., Disp: , Rfl:  .  albuterol (PROVENTIL HFA;VENTOLIN HFA) 108 (90 BASE) MCG/ACT inhaler, Inhale 2 puffs into the lungs every 6 (six) hours as needed for wheezing or shortness of breath., Disp: 1 Inhaler, Rfl: 4 .  beclomethasone (QVAR) 40 MCG/ACT inhaler, Inhale 2 puffs into the lungs 2 (two) times daily., Disp: 1 Inhaler, Rfl: 4 .  Calcium Carbonate (CALCIUM 600 PO), Take 1,800 mg by mouth daily., Disp: , Rfl:  .  CELEBREX 200 MG capsule, Take 1 capsule (200 mg total) by mouth 2 (two) times daily., Disp: 30 capsule, Rfl: 2 .  citalopram (CELEXA) 20 MG tablet, Take 1 tablet (20 mg total) by mouth daily., Disp: 90 tablet, Rfl: 0 .  cyclobenzaprine (FLEXERIL) 5 MG tablet, Take 1 tablet (5 mg total) by mouth 3 (three) times daily as needed for muscle spasms., Disp: 30 tablet, Rfl: 1 .  diphenhydrAMINE (BENADRYL) 50 MG capsule, Take 50 mg by mouth at bedtime as needed for sleep. , Disp: , Rfl:  .  docusate sodium (COLACE) 100 MG capsule, Take 100 mg by mouth 3 (three) times daily as needed for constipation., Disp: , Rfl:  .  VITAMIN D, CHOLECALCIFEROL, PO, Take 1,200 mg by mouth daily., Disp: , Rfl:  .  amoxicillin-clavulanate (AUGMENTIN) 875-125 MG per tablet, Take 1 tablet by mouth 2 (two) times daily., Disp: 20  tablet, Rfl: 0 No Known Allergies Family History  Problem Relation Age of Onset  . Arthritis Mother   . Arthritis Father   . Stroke Father   . Colon cancer Father 72  . Heart disease Father 82    chf  . Breast cancer Maternal Grandmother    History   Social History  . Marital Status: Single    Spouse Name: N/A    Number of Children: 1  . Years of Education: N/A   Occupational History  . [employer]     sits in front of computer   Social History Main Topics  . Smoking status: Current Every Day Smoker -- 0.70 packs/day for 45 years    Types: Cigarettes  . Smokeless tobacco: Never Used     Comment: she is thinking about it   . Alcohol Use: Yes     Comment: 2 times per month  . Drug Use: No  . Sexual Activity:    Partners: Male   Other Topics Concern  . None   Social History Narrative   1 caffeine drinks daily    Review of Systems: See HPI.  All other ROS are negative.  Physical Examination: BP 127/63 mmHg  Pulse 77  Temp(Src) 98.6 F (37 C) (Oral)  Resp 16  Ht 5\' 4"  (1.626 m)  Wt 207 lb 2 oz (93.951 kg)  BMI 35.54 kg/m2  SpO2 97%  General appearance: alert, cooperative and appears stated age Head: Normocephalic, without obvious abnormality, atraumatic, sinuses tender to percussion Eyes: conjunctivae/corneas clear. PERRL, EOM's intact. Fundi benign. Ears: normal TM's and external ear canals both ears Nose: moderate congestion, turbinates swollen, sinus tenderness bilateral Throat: lips, mucosa, and tongue normal; teeth and gums normal Neck: no adenopathy, no carotid bruit, no JVD, supple, symmetrical, trachea midline and thyroid not enlarged, symmetric, no tenderness/mass/nodules Lungs: clear to auscultation bilaterally Chest wall: no tenderness   Assessment/Plan: Acute bacterial sinusitis Rx Augmentin.  Increase fluids.  Rest.  Saline nasal spray.  Probiotic.  Mucinex as directed.  Humidifier in bedroom. Continue asthma medications as directed.  Call or  return to clinic if symptoms are not improving.

## 2014-01-24 NOTE — Assessment & Plan Note (Signed)
Rx Augmentin.  Increase fluids.  Rest.  Saline nasal spray.  Probiotic.  Mucinex as directed.  Humidifier in bedroom. Continue asthma medications as directed.  Call or return to clinic if symptoms are not improving.

## 2014-01-24 NOTE — Patient Instructions (Signed)
Please take antibiotic as directed.  Increase fluid intake.  Use Saline nasal spray.  Take a daily multivitamin.  Place a humidifier in the bedroom.  Please call or return clinic if symptoms are not improving.  Sinusitis Sinusitis is redness, soreness, and swelling (inflammation) of the paranasal sinuses. Paranasal sinuses are air pockets within the bones of your face (beneath the eyes, the middle of the forehead, or above the eyes). In healthy paranasal sinuses, mucus is able to drain out, and air is able to circulate through them by way of your nose. However, when your paranasal sinuses are inflamed, mucus and air can become trapped. This can allow bacteria and other germs to grow and cause infection. Sinusitis can develop quickly and last only a short time (acute) or continue over a long period (chronic). Sinusitis that lasts for more than 12 weeks is considered chronic.  CAUSES  Causes of sinusitis include:  Allergies.  Structural abnormalities, such as displacement of the cartilage that separates your nostrils (deviated septum), which can decrease the air flow through your nose and sinuses and affect sinus drainage.  Functional abnormalities, such as when the small hairs (cilia) that line your sinuses and help remove mucus do not work properly or are not present. SYMPTOMS  Symptoms of acute and chronic sinusitis are the same. The primary symptoms are pain and pressure around the affected sinuses. Other symptoms include:  Upper toothache.  Earache.  Headache.  Bad breath.  Decreased sense of smell and taste.  A cough, which worsens when you are lying flat.  Fatigue.  Fever.  Thick drainage from your nose, which often is green and may contain pus (purulent).  Swelling and warmth over the affected sinuses. DIAGNOSIS  Your caregiver will perform a physical exam. During the exam, your caregiver may:  Look in your nose for signs of abnormal growths in your nostrils (nasal  polyps).  Tap over the affected sinus to check for signs of infection.  View the inside of your sinuses (endoscopy) with a special imaging device with a light attached (endoscope), which is inserted into your sinuses. If your caregiver suspects that you have chronic sinusitis, one or more of the following tests may be recommended:  Allergy tests.  Nasal culture A sample of mucus is taken from your nose and sent to a lab and screened for bacteria.  Nasal cytology A sample of mucus is taken from your nose and examined by your caregiver to determine if your sinusitis is related to an allergy. TREATMENT  Most cases of acute sinusitis are related to a viral infection and will resolve on their own within 10 days. Sometimes medicines are prescribed to help relieve symptoms (pain medicine, decongestants, nasal steroid sprays, or saline sprays).  However, for sinusitis related to a bacterial infection, your caregiver will prescribe antibiotic medicines. These are medicines that will help kill the bacteria causing the infection.  Rarely, sinusitis is caused by a fungal infection. In theses cases, your caregiver will prescribe antifungal medicine. For some cases of chronic sinusitis, surgery is needed. Generally, these are cases in which sinusitis recurs more than 3 times per year, despite other treatments. HOME CARE INSTRUCTIONS   Drink plenty of water. Water helps thin the mucus so your sinuses can drain more easily.  Use a humidifier.  Inhale steam 3 to 4 times a day (for example, sit in the bathroom with the shower running).  Apply a warm, moist washcloth to your face 3 to 4 times a day,  or as directed by your caregiver.  Use saline nasal sprays to help moisten and clean your sinuses.  Take over-the-counter or prescription medicines for pain, discomfort, or fever only as directed by your caregiver. SEEK IMMEDIATE MEDICAL CARE IF:  You have increasing pain or severe headaches.  You have  nausea, vomiting, or drowsiness.  You have swelling around your face.  You have vision problems.  You have a stiff neck.  You have difficulty breathing. MAKE SURE YOU:   Understand these instructions.  Will watch your condition.  Will get help right away if you are not doing well or get worse. Document Released: 12/24/2004 Document Revised: 03/18/2011 Document Reviewed: 01/08/2011 [facility] Patient Information 2014 [place], [place].

## 2014-02-14 ENCOUNTER — Telehealth: Payer: Self-pay | Admitting: *Deleted

## 2014-02-14 ENCOUNTER — Encounter: Payer: Self-pay | Admitting: *Deleted

## 2014-02-14 NOTE — Telephone Encounter (Signed)
error 

## 2014-02-14 NOTE — Telephone Encounter (Addendum)
Pre-Visit Call:   Reviewed allergies, medications, health history, and health maintenance with patient and updated as needed.   Preferred pharmacy:  [facility] ([facility] [place] - [address]   Pap- 19/1/66 with Dr. Etter Sjogren- negative, would like Pap at appt [facility]- 10/25/10 with Dr. Zenovia Jarred at [place]- 4 polyps removed, small lipoma in cecum, moderate diverticulosis              (Letter states return colonoscopy due 10/2015) [facility]- 10/08/13 with Dr. Curlene Dolphin at [place] [facility]: NEG [facility]- 07/20/10 at [place]- Osteopenia Flu- declines TD- 08/29/10 UTD [facility]- never had Zoster- 10/14/12 UTD  No Concerns at this time.

## 2014-02-15 ENCOUNTER — Ambulatory Visit (HOSPITAL_BASED_OUTPATIENT_CLINIC_OR_DEPARTMENT_OTHER)
Admission: RE | Admit: 2014-02-15 | Discharge: 2014-02-15 | Disposition: A | Discharge status: Home / Self Care | Payer: 59 | Source: Ambulatory Visit | Attending: Family Medicine | Admitting: Family Medicine

## 2014-02-15 ENCOUNTER — Encounter: Payer: Self-pay | Admitting: Family Medicine

## 2014-02-15 ENCOUNTER — Ambulatory Visit (INDEPENDENT_AMBULATORY_CARE_PROVIDER_SITE_OTHER): Payer: 59 | Admitting: Family Medicine

## 2014-02-15 VITALS — BP 120/70 | HR 59 | Temp 98.3°F | Ht 64.0 in | Wt 213.0 lb

## 2014-02-15 DIAGNOSIS — M545 Low back pain: Secondary | ICD-10-CM

## 2014-02-15 DIAGNOSIS — F17200 Nicotine dependence, unspecified, uncomplicated: Secondary | ICD-10-CM | POA: Diagnosis not present

## 2014-02-15 DIAGNOSIS — J45909 Unspecified asthma, uncomplicated: Secondary | ICD-10-CM | POA: Insufficient documentation

## 2014-02-15 DIAGNOSIS — M5440 Lumbago with sciatica, unspecified side: Secondary | ICD-10-CM

## 2014-02-15 DIAGNOSIS — Z0000 Encounter for general adult medical examination without abnormal findings: Secondary | ICD-10-CM

## 2014-02-15 DIAGNOSIS — M159 Polyosteoarthritis, unspecified: Secondary | ICD-10-CM

## 2014-02-15 DIAGNOSIS — Z23 Encounter for immunization: Secondary | ICD-10-CM

## 2014-02-15 DIAGNOSIS — M150 Primary generalized (osteo)arthritis: Secondary | ICD-10-CM

## 2014-02-15 DIAGNOSIS — F411 Generalized anxiety disorder: Secondary | ICD-10-CM

## 2014-02-15 DIAGNOSIS — F439 Reaction to severe stress, unspecified: Secondary | ICD-10-CM

## 2014-02-15 DIAGNOSIS — Z658 Other specified problems related to psychosocial circumstances: Secondary | ICD-10-CM

## 2014-02-15 DIAGNOSIS — R0602 Shortness of breath: Secondary | ICD-10-CM

## 2014-02-15 DIAGNOSIS — J449 Chronic obstructive pulmonary disease, unspecified: Secondary | ICD-10-CM

## 2014-02-15 DIAGNOSIS — M8949 Other hypertrophic osteoarthropathy, multiple sites: Secondary | ICD-10-CM

## 2014-02-15 LAB — LIPID PANEL
CHOLESTEROL: 184 mg/dL (ref 0–200)
HDL: 47.3 mg/dL (ref >39.00)
LDL CALC: 110 mg/dL — AB (ref 0–99)
NonHDL: 136.7
Total CHOL/HDL Ratio: 4
Triglycerides: 133 mg/dL (ref 0.0–149.0)
VLDL: 26.6 mg/dL (ref 0.0–40.0)

## 2014-02-15 LAB — HEPATIC FUNCTION PANEL
ALT: 12 U/L (ref 0–35)
AST: 14 U/L (ref 0–37)
Albumin: 4.2 g/dL (ref 3.5–5.2)
Alkaline Phosphatase: 103 U/L (ref 39–117)
Bilirubin, Direct: 0.1 mg/dL (ref 0.0–0.3)
Total Bilirubin: 0.4 mg/dL (ref 0.2–1.2)
Total Protein: 7.2 g/dL (ref 6.0–8.3)

## 2014-02-15 LAB — CBC WITH DIFFERENTIAL/PLATELET
Basophils Absolute: 0.1 K/uL (ref 0.0–0.1)
Basophils Relative: 0.6 % (ref 0.0–3.0)
Eosinophils Absolute: 0.3 K/uL (ref 0.0–0.7)
Eosinophils Relative: 3.6 % (ref 0.0–5.0)
HCT: 37.7 % (ref 36.0–46.0)
Hemoglobin: 12.6 g/dL (ref 12.0–15.0)
Lymphocytes Relative: 33.7 % (ref 12.0–46.0)
Lymphs Abs: 3.3 K/uL (ref 0.7–4.0)
MCHC: 33.5 g/dL (ref 30.0–36.0)
MCV: 89.2 fl (ref 78.0–100.0)
Monocytes Absolute: 0.7 K/uL (ref 0.1–1.0)
Monocytes Relative: 7.2 % (ref 3.0–12.0)
Neutro Abs: 5.4 K/uL (ref 1.4–7.7)
Neutrophils Relative %: 54.9 % (ref 43.0–77.0)
Platelets: 391 K/uL (ref 150.0–400.0)
RBC: 4.22 Mil/uL (ref 3.87–5.11)
RDW: 14.9 % (ref 11.5–15.5)
WBC: 9.8 K/uL (ref 4.0–10.5)

## 2014-02-15 LAB — POCT URINALYSIS DIPSTICK
Bilirubin, UA: NEGATIVE
Blood, UA: NEGATIVE
Glucose, UA: NEGATIVE
Ketones, UA: NEGATIVE
Leukocytes, UA: NEGATIVE
Nitrite, UA: NEGATIVE
Protein, UA: NEGATIVE
Spec Grav, UA: 1.015
Urobilinogen, UA: NEGATIVE
pH, UA: 6

## 2014-02-15 LAB — BASIC METABOLIC PANEL
BUN: 19 mg/dL (ref 6–23)
CHLORIDE: 106 meq/L (ref 96–112)
CO2: 28 mEq/L (ref 19–32)
Calcium: 10.2 mg/dL (ref 8.4–10.5)
Creatinine, Ser: 0.83 mg/dL (ref 0.40–1.20)
GFR: 73.87 mL/min (ref >60.00)
Glucose, Bld: 89 mg/dL (ref 70–99)
Potassium: 4.5 mEq/L (ref 3.5–5.1)
Sodium: 139 mEq/L (ref 135–145)

## 2014-02-15 LAB — TSH: TSH: 1.17 u[IU]/mL (ref 0.35–4.50)

## 2014-02-15 IMAGING — CR DG CHEST 2V
2 series · 2 of 2 positions shown · non-contrast
Comparison: [DATE]

CLINICAL DATA: Asthma and shortness of breath since last chest
x-ray 2 years ago. Past medical history: Active smoker

EXAM:
CHEST  2 VIEW

[w chest pa]
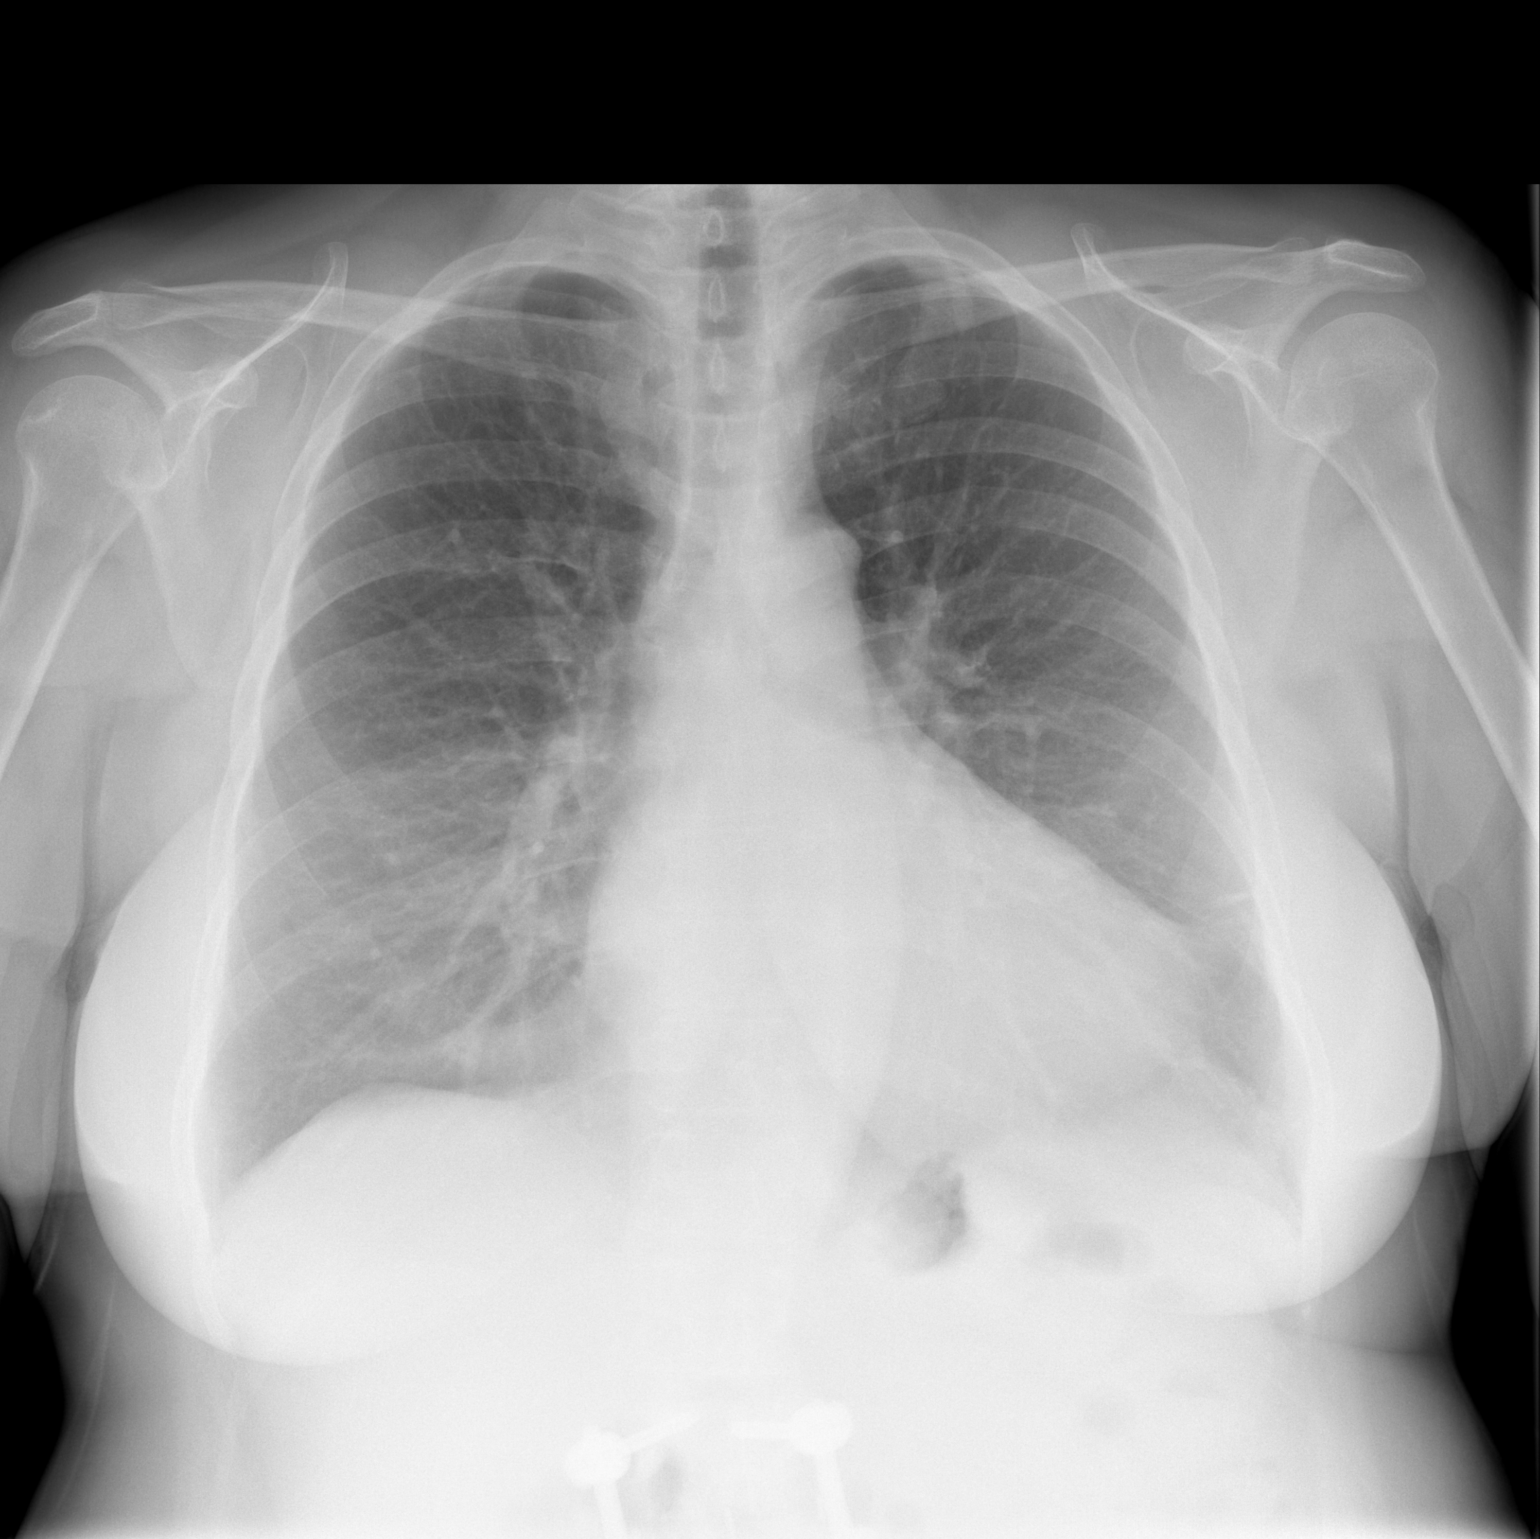

[w chest lat]
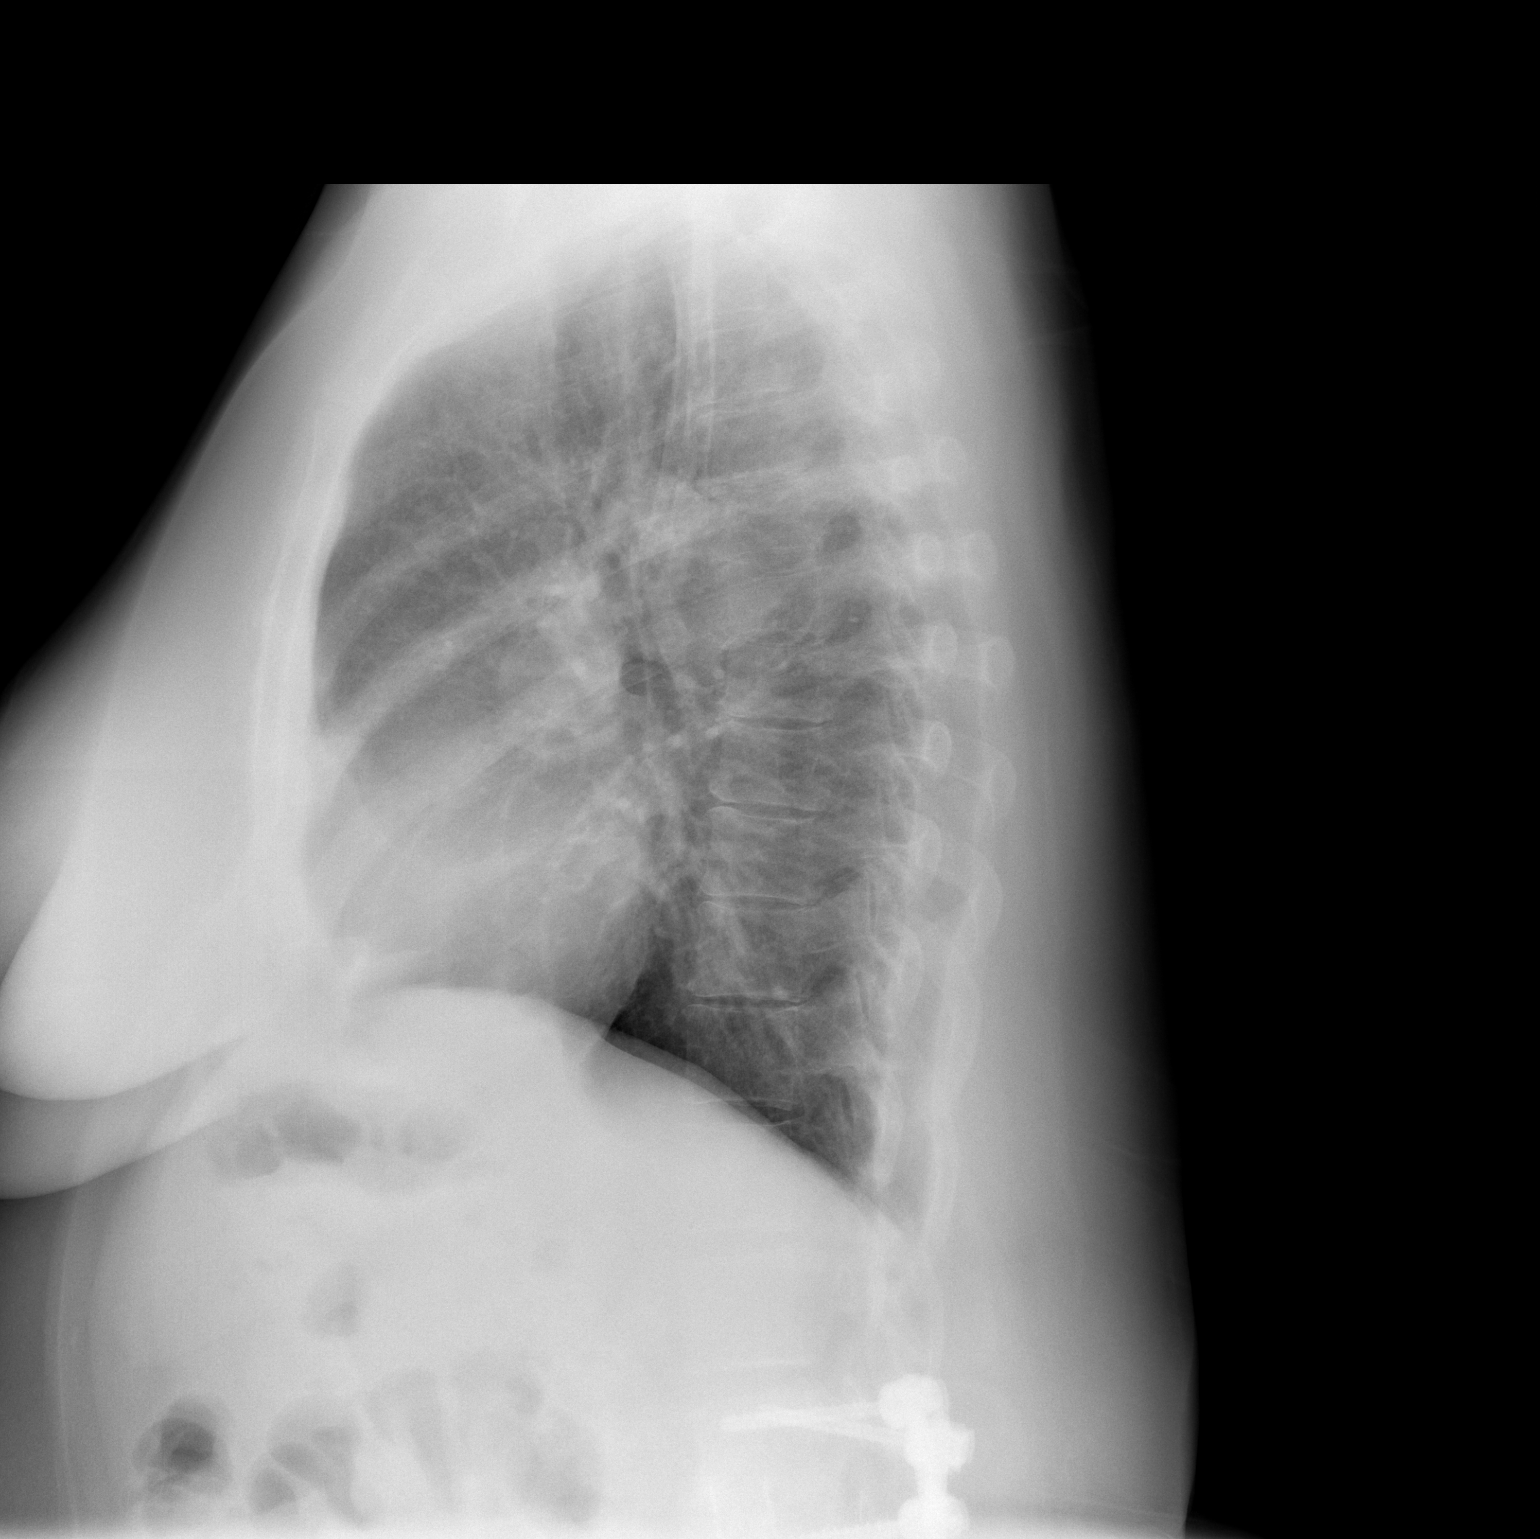

[2 of 2 positions shown; findings below may reference images not displayed]

FINDINGS: There is no focal parenchymal opacity, pleural effusion, or
pneumothorax. The heart and mediastinal contours are unremarkable.

The osseous structures are unremarkable.
IMPRESSION: No active cardiopulmonary disease.

## 2014-02-15 MED ORDER — CELEBREX 200 MG PO CAPS: 200.0000 mg | ORAL_CAPSULE | Freq: Two times a day (BID) | ORAL | Status: DC

## 2014-02-15 MED ORDER — CITALOPRAM HYDROBROMIDE 20 MG PO TABS: 20.0000 mg | ORAL_TABLET | Freq: Every day | ORAL | Status: DC

## 2014-02-15 MED ORDER — CYCLOBENZAPRINE HCL 5 MG PO TABS: 5.0000 mg | ORAL_TABLET | Freq: Three times a day (TID) | ORAL | Status: DC | PRN

## 2014-02-15 NOTE — Progress Notes (Signed)
Pre visit review using our clinic review tool, if applicable. No additional management support is needed unless otherwise documented below in the visit note. 

## 2014-02-15 NOTE — Assessment & Plan Note (Signed)
Refill celebrex.

## 2014-02-15 NOTE — Patient Instructions (Signed)
Preventive Care for Adults A healthy lifestyle and preventive care can promote health and wellness. Preventive health guidelines for women include the following key practices.  A routine yearly physical is a good way to check with your health care provider about your health and preventive screening. It is a chance to share any concerns and updates on your health and to receive a thorough exam.  Visit your dentist for a routine exam and preventive care every 6 months. Brush your teeth twice a day and floss once a day. Good oral hygiene prevents tooth decay and gum disease.  The frequency of eye exams is based on your age, health, family medical history, use of contact lenses, and other factors. Follow your health care provider's recommendations for frequency of eye exams.  Eat a healthy diet. Foods like vegetables, fruits, whole grains, low-fat dairy products, and lean protein foods contain the nutrients you need without too many calories. Decrease your intake of foods high in solid fats, added sugars, and salt. Eat the right amount of calories for you.Get information about a proper diet from your health care provider, if necessary.  Regular physical exercise is one of the most important things you can do for your health. Most adults should get at least 150 minutes of moderate-intensity exercise (any activity that increases your heart rate and causes you to sweat) each week. In addition, most adults need muscle-strengthening exercises on 2 or more days a week.  Maintain a healthy weight. The body mass index (BMI) is a screening tool to identify possible weight problems. It provides an estimate of body fat based on height and weight. Your health care provider can find your BMI and can help you achieve or maintain a healthy weight.For adults 20 years and older:  A BMI below 18.5 is considered underweight.  A BMI of 18.5 to 24.9 is normal.  A BMI of 25 to 29.9 is considered overweight.  A BMI of  30 and above is considered obese.  Maintain normal blood lipids and cholesterol levels by exercising and minimizing your intake of saturated fat. Eat a balanced diet with plenty of fruit and vegetables. Blood tests for lipids and cholesterol should begin at age 76 and be repeated every 5 years. If your lipid or cholesterol levels are high, you are over 50, or you are at high risk for heart disease, you may need your cholesterol levels checked more frequently.Ongoing high lipid and cholesterol levels should be treated with medicines if diet and exercise are not working.  If you smoke, find out from your health care provider how to quit. If you do not use tobacco, do not start.  Lung cancer screening is recommended for adults aged 22-80 years who are at high risk for developing lung cancer because of a history of smoking. A yearly low-dose CT scan of the lungs is recommended for people who have at least a 30-pack-year history of smoking and are a current smoker or have quit within the past 15 years. A pack year of smoking is smoking an average of 1 pack of cigarettes a day for 1 year (for example: 1 pack a day for 30 years or 2 packs a day for 15 years). Yearly screening should continue until the smoker has stopped smoking for at least 15 years. Yearly screening should be stopped for people who develop a health problem that would prevent them from having lung cancer treatment.  If you are pregnant, do not drink alcohol. If you are breastfeeding,  be very cautious about drinking alcohol. If you are not pregnant and choose to drink alcohol, do not have more than 1 drink per day. One drink is considered to be 12 ounces (355 mL) of beer, 5 ounces (148 mL) of wine, or 1.5 ounces (44 mL) of liquor.  Avoid use of street drugs. Do not share needles with anyone. Ask for help if you need support or instructions about stopping the use of drugs.  High blood pressure causes heart disease and increases the risk of  stroke. Your blood pressure should be checked at least every 1 to 2 years. Ongoing high blood pressure should be treated with medicines if weight loss and exercise do not work.  If you are 75-52 years old, ask your health care provider if you should take aspirin to prevent strokes.  Diabetes screening involves taking a blood sample to check your fasting blood sugar level. This should be done once every 3 years, after age 15, if you are within normal weight and without risk factors for diabetes. Testing should be considered at a younger age or be carried out more frequently if you are overweight and have at least 1 risk factor for diabetes.  Breast cancer screening is essential preventive care for women. You should practice "breast self-awareness." This means understanding the normal appearance and feel of your breasts and may include breast self-examination. Any changes detected, no matter how small, should be reported to a health care provider. Women in their 58s and 30s should have a clinical breast exam (CBE) by a health care provider as part of a regular health exam every 1 to 3 years. After age 16, women should have a CBE every year. Starting at age 53, women should consider having a mammogram (breast X-ray test) every year. Women who have a family history of breast cancer should talk to their health care provider about genetic screening. Women at a high risk of breast cancer should talk to their health care providers about having an MRI and a mammogram every year.  Breast cancer gene (BRCA)-related cancer risk assessment is recommended for women who have family members with BRCA-related cancers. BRCA-related cancers include breast, ovarian, tubal, and peritoneal cancers. Having family members with these cancers may be associated with an increased risk for harmful changes (mutations) in the breast cancer genes BRCA1 and BRCA2. Results of the assessment will determine the need for genetic counseling and  BRCA1 and BRCA2 testing.  Routine pelvic exams to screen for cancer are no longer recommended for nonpregnant women who are considered low risk for cancer of the pelvic organs (ovaries, uterus, and vagina) and who do not have symptoms. Ask your health care provider if a screening pelvic exam is right for you.  If you have had past treatment for cervical cancer or a condition that could lead to cancer, you need Pap tests and screening for cancer for at least 20 years after your treatment. If Pap tests have been discontinued, your risk factors (such as having a new sexual partner) need to be reassessed to determine if screening should be resumed. Some women have medical problems that increase the chance of getting cervical cancer. In these cases, your health care provider may recommend more frequent screening and Pap tests.  The HPV test is an additional test that may be used for cervical cancer screening. The HPV test looks for the virus that can cause the cell changes on the cervix. The cells collected during the Pap test can be  tested for HPV. The HPV test could be used to screen women aged 30 years and older, and should be used in women of any age who have unclear Pap test results. After the age of 30, women should have HPV testing at the same frequency as a Pap test.  Colorectal cancer can be detected and often prevented. Most routine colorectal cancer screening begins at the age of 50 years and continues through age 75 years. However, your health care provider may recommend screening at an earlier age if you have risk factors for colon cancer. On a yearly basis, your health care provider may provide home test kits to check for hidden blood in the stool. Use of a small camera at the end of a tube, to directly examine the colon (sigmoidoscopy or colonoscopy), can detect the earliest forms of colorectal cancer. Talk to your health care provider about this at age 50, when routine screening begins. Direct  exam of the colon should be repeated every 5-10 years through age 75 years, unless early forms of pre-cancerous polyps or small growths are found.  People who are at an increased risk for hepatitis B should be screened for this virus. You are considered at high risk for hepatitis B if:  You were born in a country where hepatitis B occurs often. Talk with your health care provider about which countries are considered high risk.  Your parents were born in a high-risk country and you have not received a shot to protect against hepatitis B (hepatitis B vaccine).  You have HIV or AIDS.  You use needles to inject street drugs.  You live with, or have sex with, someone who has hepatitis B.  You get hemodialysis treatment.  You take certain medicines for conditions like cancer, organ transplantation, and autoimmune conditions.  Hepatitis C blood testing is recommended for all people born from 1945 through 1965 and any individual with known risks for hepatitis C.  Practice safe sex. Use condoms and avoid high-risk sexual practices to reduce the spread of sexually transmitted infections (STIs). STIs include gonorrhea, chlamydia, syphilis, trichomonas, herpes, HPV, and human immunodeficiency virus (HIV). Herpes, HIV, and HPV are viral illnesses that have no cure. They can result in disability, cancer, and death.  You should be screened for sexually transmitted illnesses (STIs) including gonorrhea and chlamydia if:  You are sexually active and are younger than 24 years.  You are older than 24 years and your health care provider tells you that you are at risk for this type of infection.  Your sexual activity has changed since you were last screened and you are at an increased risk for chlamydia or gonorrhea. Ask your health care provider if you are at risk.  If you are at risk of being infected with HIV, it is recommended that you take a prescription medicine daily to prevent HIV infection. This is  called preexposure prophylaxis (PrEP). You are considered at risk if:  You are a heterosexual woman, are sexually active, and are at increased risk for HIV infection.  You take drugs by injection.  You are sexually active with a partner who has HIV.  Talk with your health care provider about whether you are at high risk of being infected with HIV. If you choose to begin PrEP, you should first be tested for HIV. You should then be tested every 3 months for as long as you are taking PrEP.  Osteoporosis is a disease in which the bones lose minerals and strength   with aging. This can result in serious bone fractures or breaks. The risk of osteoporosis can be identified using a bone density scan. Women ages 65 years and over and women at risk for fractures or osteoporosis should discuss screening with their health care providers. Ask your health care provider whether you should take a calcium supplement or vitamin D to reduce the rate of osteoporosis.  Menopause can be associated with physical symptoms and risks. Hormone replacement therapy is available to decrease symptoms and risks. You should talk to your health care provider about whether hormone replacement therapy is right for you.  Use sunscreen. Apply sunscreen liberally and repeatedly throughout the day. You should seek shade when your shadow is shorter than you. Protect yourself by wearing long sleeves, pants, a wide-brimmed hat, and sunglasses year round, whenever you are outdoors.  Once a month, do a whole body skin exam, using a mirror to look at the skin on your back. Tell your health care provider of new moles, moles that have irregular borders, moles that are larger than a pencil eraser, or moles that have changed in shape or color.  Stay current with required vaccines (immunizations).  Influenza vaccine. All adults should be immunized every year.  Tetanus, diphtheria, and acellular pertussis (Td, Tdap) vaccine. Pregnant women should  receive 1 dose of Tdap vaccine during each pregnancy. The dose should be obtained regardless of the length of time since the last dose. Immunization is preferred during the 27th-36th week of gestation. An adult who has not previously received Tdap or who does not know her vaccine status should receive 1 dose of Tdap. This initial dose should be followed by tetanus and diphtheria toxoids (Td) booster doses every 10 years. Adults with an unknown or incomplete history of completing a 3-dose immunization series with Td-containing vaccines should begin or complete a primary immunization series including a Tdap dose. Adults should receive a Td booster every 10 years.  Varicella vaccine. An adult without evidence of immunity to varicella should receive 2 doses or a second dose if she has previously received 1 dose. Pregnant females who do not have evidence of immunity should receive the first dose after pregnancy. This first dose should be obtained before leaving the health care facility. The second dose should be obtained 4-8 weeks after the first dose.  Human papillomavirus (HPV) vaccine. Females aged 13-26 years who have not received the vaccine previously should obtain the 3-dose series. The vaccine is not recommended for use in pregnant females. However, pregnancy testing is not needed before receiving a dose. If a female is found to be pregnant after receiving a dose, no treatment is needed. In that case, the remaining doses should be delayed until after the pregnancy. Immunization is recommended for any person with an immunocompromised condition through the age of 26 years if she did not get any or all doses earlier. During the 3-dose series, the second dose should be obtained 4-8 weeks after the first dose. The third dose should be obtained 24 weeks after the first dose and 16 weeks after the second dose.  Zoster vaccine. One dose is recommended for adults aged 60 years or older unless certain conditions are  present.  Measles, mumps, and rubella (MMR) vaccine. Adults born before 1957 generally are considered immune to measles and mumps. Adults born in 1957 or later should have 1 or more doses of MMR vaccine unless there is a contraindication to the vaccine or there is laboratory evidence of immunity to   each of the three diseases. A routine second dose of MMR vaccine should be obtained at least 28 days after the first dose for students attending postsecondary schools, health care workers, or international travelers. People who received inactivated measles vaccine or an unknown type of measles vaccine during 1963-1967 should receive 2 doses of MMR vaccine. People who received inactivated mumps vaccine or an unknown type of mumps vaccine before 1979 and are at high risk for mumps infection should consider immunization with 2 doses of MMR vaccine. For females of childbearing age, rubella immunity should be determined. If there is no evidence of immunity, females who are not pregnant should be vaccinated. If there is no evidence of immunity, females who are pregnant should delay immunization until after pregnancy. Unvaccinated health care workers born before 1957 who lack laboratory evidence of measles, mumps, or rubella immunity or laboratory confirmation of disease should consider measles and mumps immunization with 2 doses of MMR vaccine or rubella immunization with 1 dose of MMR vaccine.  Pneumococcal 13-valent conjugate (PCV13) vaccine. When indicated, a person who is uncertain of her immunization history and has no record of immunization should receive the PCV13 vaccine. An adult aged 19 years or older who has certain medical conditions and has not been previously immunized should receive 1 dose of PCV13 vaccine. This PCV13 should be followed with a dose of pneumococcal polysaccharide (PPSV23) vaccine. The PPSV23 vaccine dose should be obtained at least 8 weeks after the dose of PCV13 vaccine. An adult aged 19  years or older who has certain medical conditions and previously received 1 or more doses of PPSV23 vaccine should receive 1 dose of PCV13. The PCV13 vaccine dose should be obtained 1 or more years after the last PPSV23 vaccine dose.  Pneumococcal polysaccharide (PPSV23) vaccine. When PCV13 is also indicated, PCV13 should be obtained first. All adults aged 65 years and older should be immunized. An adult younger than age 65 years who has certain medical conditions should be immunized. Any person who resides in a nursing home or long-term care facility should be immunized. An adult smoker should be immunized. People with an immunocompromised condition and certain other conditions should receive both PCV13 and PPSV23 vaccines. People with human immunodeficiency virus (HIV) infection should be immunized as soon as possible after diagnosis. Immunization during chemotherapy or radiation therapy should be avoided. Routine use of PPSV23 vaccine is not recommended for American Indians, [place] Natives, or people younger than 65 years unless there are medical conditions that require PPSV23 vaccine. When indicated, people who have unknown immunization and have no record of immunization should receive PPSV23 vaccine. One-time revaccination 5 years after the first dose of PPSV23 is recommended for people aged 19-64 years who have chronic kidney failure, nephrotic syndrome, asplenia, or immunocompromised conditions. People who received 1-2 doses of PPSV23 before age 65 years should receive another dose of PPSV23 vaccine at age 65 years or later if at least 5 years have passed since the previous dose. Doses of PPSV23 are not needed for people immunized with PPSV23 at or after age 65 years.  Meningococcal vaccine. Adults with asplenia or persistent complement component deficiencies should receive 2 doses of quadrivalent meningococcal conjugate (MenACWY-D) vaccine. The doses should be obtained at least 2 months apart.  Microbiologists working with certain meningococcal bacteria, military recruits, people at risk during an outbreak, and people who travel to or live in countries with a high rate of meningitis should be immunized. A first-year college student up through age   21 years who is living in a residence hall should receive a dose if she did not receive a dose on or after her 16th birthday. Adults who have certain high-risk conditions should receive one or more doses of vaccine.  Hepatitis A vaccine. Adults who wish to be protected from this disease, have certain high-risk conditions, work with hepatitis A-infected animals, work in hepatitis A research labs, or travel to or work in countries with a high rate of hepatitis A should be immunized. Adults who were previously unvaccinated and who anticipate close contact with an international adoptee during the first 60 days after arrival in the [place] from a country with a high rate of hepatitis A should be immunized.  Hepatitis B vaccine. Adults who wish to be protected from this disease, have certain high-risk conditions, may be exposed to blood or other infectious body fluids, are household contacts or sex partners of hepatitis B positive people, are clients or workers in certain care facilities, or travel to or work in countries with a high rate of hepatitis B should be immunized.  Haemophilus influenzae type b (Hib) vaccine. A previously unvaccinated person with asplenia or sickle cell disease or having a scheduled splenectomy should receive 1 dose of Hib vaccine. Regardless of previous immunization, a recipient of a hematopoietic stem cell transplant should receive a 3-dose series 6-12 months after her successful transplant. Hib vaccine is not recommended for adults with HIV infection. Preventive Services / Frequency Ages 64 to 68 years  Blood pressure check.** / Every 1 to 2 years.  Lipid and cholesterol check.** / Every 5 years beginning at age  22.  Clinical breast exam.** / Every 3 years for women in their 88s and 53s.  BRCA-related cancer risk assessment.** / For women who have family members with a BRCA-related cancer (breast, ovarian, tubal, or peritoneal cancers).  Pap test.** / Every 2 years from ages 90 through 51. Every 3 years starting at age 21 through age 56 or 3 with a history of 3 consecutive normal Pap tests.  HPV screening.** / Every 3 years from ages 24 through ages 1 to 46 with a history of 3 consecutive normal Pap tests.  Hepatitis C blood test.** / For any individual with known risks for hepatitis C.  Skin self-exam. / Monthly.  Influenza vaccine. / Every year.  Tetanus, diphtheria, and acellular pertussis (Tdap, Td) vaccine.** / Consult your health care provider. Pregnant women should receive 1 dose of Tdap vaccine during each pregnancy. 1 dose of Td every 10 years.  Varicella vaccine.** / Consult your health care provider. Pregnant females who do not have evidence of immunity should receive the first dose after pregnancy.  HPV vaccine. / 3 doses over 6 months, if 72 and younger. The vaccine is not recommended for use in pregnant females. However, pregnancy testing is not needed before receiving a dose.  Measles, mumps, rubella (MMR) vaccine.** / You need at least 1 dose of MMR if you were born in 1957 or later. You may also need a 2nd dose. For females of childbearing age, rubella immunity should be determined. If there is no evidence of immunity, females who are not pregnant should be vaccinated. If there is no evidence of immunity, females who are pregnant should delay immunization until after pregnancy.  Pneumococcal 13-valent conjugate (PCV13) vaccine.** / Consult your health care provider.  Pneumococcal polysaccharide (PPSV23) vaccine.** / 1 to 2 doses if you smoke cigarettes or if you have certain conditions.  Meningococcal vaccine.** /  1 dose if you are age 19 to 21 years and a first-year college  student living in a residence hall, or have one of several medical conditions, you need to get vaccinated against meningococcal disease. You may also need additional booster doses.  Hepatitis A vaccine.** / Consult your health care provider.  Hepatitis B vaccine.** / Consult your health care provider.  Haemophilus influenzae type b (Hib) vaccine.** / Consult your health care provider. Ages 40 to 64 years  Blood pressure check.** / Every 1 to 2 years.  Lipid and cholesterol check.** / Every 5 years beginning at age 20 years.  Lung cancer screening. / Every year if you are aged 55-80 years and have a 30-pack-year history of smoking and currently smoke or have quit within the past 15 years. Yearly screening is stopped once you have quit smoking for at least 15 years or develop a health problem that would prevent you from having lung cancer treatment.  Clinical breast exam.** / Every year after age 40 years.  BRCA-related cancer risk assessment.** / For women who have family members with a BRCA-related cancer (breast, ovarian, tubal, or peritoneal cancers).  Mammogram.** / Every year beginning at age 40 years and continuing for as long as you are in good health. Consult with your health care provider.  Pap test.** / Every 3 years starting at age 30 years through age 65 or 70 years with a history of 3 consecutive normal Pap tests.  HPV screening.** / Every 3 years from ages 30 years through ages 65 to 70 years with a history of 3 consecutive normal Pap tests.  Fecal occult blood test (FOBT) of stool. / Every year beginning at age 50 years and continuing until age 75 years. You may not need to do this test if you get a colonoscopy every 10 years.  Flexible sigmoidoscopy or colonoscopy.** / Every 5 years for a flexible sigmoidoscopy or every 10 years for a colonoscopy beginning at age 50 years and continuing until age 75 years.  Hepatitis C blood test.** / For all people born from 1945 through  1965 and any individual with known risks for hepatitis C.  Skin self-exam. / Monthly.  Influenza vaccine. / Every year.  Tetanus, diphtheria, and acellular pertussis (Tdap/Td) vaccine.** / Consult your health care provider. Pregnant women should receive 1 dose of Tdap vaccine during each pregnancy. 1 dose of Td every 10 years.  Varicella vaccine.** / Consult your health care provider. Pregnant females who do not have evidence of immunity should receive the first dose after pregnancy.  Zoster vaccine.** / 1 dose for adults aged 60 years or older.  Measles, mumps, rubella (MMR) vaccine.** / You need at least 1 dose of MMR if you were born in 1957 or later. You may also need a 2nd dose. For females of childbearing age, rubella immunity should be determined. If there is no evidence of immunity, females who are not pregnant should be vaccinated. If there is no evidence of immunity, females who are pregnant should delay immunization until after pregnancy.  Pneumococcal 13-valent conjugate (PCV13) vaccine.** / Consult your health care provider.  Pneumococcal polysaccharide (PPSV23) vaccine.** / 1 to 2 doses if you smoke cigarettes or if you have certain conditions.  Meningococcal vaccine.** / Consult your health care provider.  Hepatitis A vaccine.** / Consult your health care provider.  Hepatitis B vaccine.** / Consult your health care provider.  Haemophilus influenzae type b (Hib) vaccine.** / Consult your health care provider. Ages 65   years and over  Blood pressure check.** / Every 1 to 2 years.  Lipid and cholesterol check.** / Every 5 years beginning at age 22 years.  Lung cancer screening. / Every year if you are aged 73-80 years and have a 30-pack-year history of smoking and currently smoke or have quit within the past 15 years. Yearly screening is stopped once you have quit smoking for at least 15 years or develop a health problem that would prevent you from having lung cancer  treatment.  Clinical breast exam.** / Every year after age 4 years.  BRCA-related cancer risk assessment.** / For women who have family members with a BRCA-related cancer (breast, ovarian, tubal, or peritoneal cancers).  Mammogram.** / Every year beginning at age 40 years and continuing for as long as you are in good health. Consult with your health care provider.  Pap test.** / Every 3 years starting at age 9 years through age 34 or 91 years with 3 consecutive normal Pap tests. Testing can be stopped between 65 and 70 years with 3 consecutive normal Pap tests and no abnormal Pap or HPV tests in the past 10 years.  HPV screening.** / Every 3 years from ages 57 years through ages 64 or 45 years with a history of 3 consecutive normal Pap tests. Testing can be stopped between 65 and 70 years with 3 consecutive normal Pap tests and no abnormal Pap or HPV tests in the past 10 years.  Fecal occult blood test (FOBT) of stool. / Every year beginning at age 15 years and continuing until age 17 years. You may not need to do this test if you get a colonoscopy every 10 years.  Flexible sigmoidoscopy or colonoscopy.** / Every 5 years for a flexible sigmoidoscopy or every 10 years for a colonoscopy beginning at age 86 years and continuing until age 71 years.  Hepatitis C blood test.** / For all people born from [birth year] through 1964 and any individual with known risks for hepatitis C.  Osteoporosis screening.** / A one-time screening for women ages 83 years and over and women at risk for fractures or osteoporosis.  Skin self-exam. / Monthly.  Influenza vaccine. / Every year.  Tetanus, diphtheria, and acellular pertussis (Tdap/Td) vaccine.** / 1 dose of Td every 10 years.  Varicella vaccine.** / Consult your health care provider.  Zoster vaccine.** / 1 dose for adults aged 61 years or older.  Pneumococcal 13-valent conjugate (PCV13) vaccine.** / Consult your health care provider.  Pneumococcal  polysaccharide (PPSV23) vaccine.** / 1 dose for all adults aged 28 years and older.  Meningococcal vaccine.** / Consult your health care provider.  Hepatitis A vaccine.** / Consult your health care provider.  Hepatitis B vaccine.** / Consult your health care provider.  Haemophilus influenzae type b (Hib) vaccine.** / Consult your health care provider. ** Family history and personal history of risk and conditions may change your health care provider's recommendations. Document Released: 02/19/2001 Document Revised: 05/10/2013 Document Reviewed: 05/21/2010 [facility] Patient Information 2015 [place], [place]. This information is not intended to replace advice given to you by your health care provider. Make sure you discuss any questions you have with your health care provider.

## 2014-02-15 NOTE — Assessment & Plan Note (Signed)
con't inhalers for now rto for spirometry

## 2014-02-15 NOTE — Progress Notes (Signed)
Subjective:     Jasmine Mclaughlin is a 64 y.o. female and is here for a comprehensive physical exam. The patient reports problems - new dx asthma / copd.  History   Social History  . Marital Status: Single    Spouse Name: N/A    Number of Children: 1  . Years of Education: N/A   Occupational History  . [employer]     sits in front of computer   Social History Main Topics  . Smoking status: Current Every Day Smoker -- 0.70 packs/day for 45 years    Types: Cigarettes  . Smokeless tobacco: Never Used     Comment: she is thinking about it   . Alcohol Use: Yes     Comment: 2 times per month  . Drug Use: No  . Sexual Activity:    Partners: Male   Other Topics Concern  . Not on file   Social History Narrative   1 caffeine drinks daily    Health Maintenance  Topic Date Due  . INFLUENZA VACCINE  08/22/2014 (Originally 08/07/2013)  . MAMMOGRAM  10/09/2015  . PAP SMEAR  10/15/2015  . COLONOSCOPY  10/25/2015  . TETANUS/TDAP  08/28/2020  . ZOSTAVAX  Completed    The following portions of the patient's history were reviewed and updated as appropriate:  She  has a past medical history of Colon polyps; Genital warts; Asthma; Shortness of breath; and Arthritis. She  does not have any pertinent problems on file. She  has past surgical history that includes Knee arthroscopy; Tonsillectomy; Vaginal delivery; Anterior fusion lumbar spine (05/06/2012); and Anterior lat lumbar fusion (N/A, 05/06/2012). Her family history includes Arthritis in her father and mother; Asthma in her daughter and maternal grandmother; Breast cancer in her maternal grandmother; Colon cancer (age of onset: 75) in her father; Heart disease (age of onset: 71) in her father; Stroke in her father. She  reports that she has been smoking Cigarettes.  She has a 31.5 pack-year smoking history. She has never used smokeless tobacco. She reports that she drinks alcohol. She reports that she does not use illicit drugs. She has a current  medication list which includes the following prescription(s): acetaminophen, albuterol, beclomethasone, calcium carbonate, celebrex, citalopram, cyclobenzaprine, diphenhydramine, docusate sodium, and cholecalciferol. Current Outpatient Prescriptions on File Prior to Visit  Medication Sig Dispense Refill  . acetaminophen (TYLENOL) 650 MG CR tablet Take 650 mg by mouth every 8 (eight) hours as needed for pain.    Marland Kitchen albuterol (PROVENTIL HFA;VENTOLIN HFA) 108 (90 BASE) MCG/ACT inhaler Inhale 2 puffs into the lungs every 6 (six) hours as needed for wheezing or shortness of breath. 1 Inhaler 4  . beclomethasone (QVAR) 40 MCG/ACT inhaler Inhale 2 puffs into the lungs 2 (two) times daily. 1 Inhaler 4  . Calcium Carbonate (CALCIUM 600 PO) Take 1,800 mg by mouth daily.    . diphenhydrAMINE (BENADRYL) 50 MG capsule Take 50 mg by mouth at bedtime as needed for sleep.     Marland Kitchen docusate sodium (COLACE) 100 MG capsule Take 100 mg by mouth 3 (three) times daily as needed for constipation.    Marland Kitchen VITAMIN D, CHOLECALCIFEROL, PO Take 1,200 mg by mouth daily.     No current facility-administered medications on file prior to visit.   She has No Known Allergies..  Review of Systems Review of Systems  Constitutional: Negative for activity change, appetite change and fatigue.  HENT: Negative for hearing loss, congestion, tinnitus and ear discharge.  dentist-- dentures Eyes:  Negative for visual disturbance (see optho q1y -- vision corrected to 20/20 with glasses).  Respiratory: Negative for cough, chest tightness + DOE.   Cardiovascular: Negative for chest pain, palpitations and leg swelling.  Gastrointestinal: Negative for abdominal pain, diarrhea, constipation and abdominal distention.  Genitourinary: Negative for urgency, frequency, decreased urine volume and difficulty urinating.  Musculoskeletal: Negative for back pain, arthralgias and gait problem.  Skin: Negative for color change, pallor and rash.  Neurological:  Negative for dizziness, light-headedness, numbness and headaches.  Hematological: Negative for adenopathy. Does not bruise/bleed easily.  Psychiatric/Behavioral: Negative for suicidal ideas, confusion, sleep disturbance, self-injury, dysphoric mood, decreased concentration and agitation.       Objective:    BP 120/70 mmHg  Pulse 59  Temp(Src) 98.3 F (36.8 C) (Oral)  Ht 5\' 4"  (1.626 m)  Wt 213 lb (96.616 kg)  BMI 36.54 kg/m2  SpO2 97% General appearance: alert, cooperative, appears stated age and no distress Head: Normocephalic, without obvious abnormality, atraumatic Eyes: conjunctivae/corneas clear. PERRL, EOM's intact. Fundi benign. Ears: normal TM's and external ear canals both ears Nose: Nares normal. Septum midline. Mucosa normal. No drainage or sinus tenderness. Throat: lips, mucosa, and tongue normal; teeth and gums normal Neck: no adenopathy, supple, symmetrical, trachea midline and thyroid not enlarged, symmetric, no tenderness/mass/nodules Back: symmetric, no curvature. ROM normal. No CVA tenderness. Lungs: clear to auscultation bilaterally Breasts: normal appearance, no masses or tenderness Heart: regular rate and rhythm, S1, S2 normal, no murmur, click, rub or gallop Abdomen: soft, non-tender; bowel sounds normal; no masses,  no organomegaly Pelvic: deferred Extremities: extremities normal, atraumatic, no cyanosis or edema Pulses: 2+ and symmetric Skin: Skin color, texture, turgor normal. No rashes or lesions Lymph nodes: Cervical, supraclavicular, and axillary nodes normal. Neurologic: Alert and oriented X 3, normal strength and tone. Normal symmetric reflexes. Normal coordination and gait Psych- no depression, no anxiety      Assessment:    Healthy female exam.      Plan:     ghm utd Check labs See After Visit Summary for Counseling Recommendations

## 2014-02-15 NOTE — Assessment & Plan Note (Signed)
con't celexa stable

## 2014-03-07 ENCOUNTER — Ambulatory Visit (INDEPENDENT_AMBULATORY_CARE_PROVIDER_SITE_OTHER): Payer: 59 | Admitting: Family Medicine

## 2014-03-07 ENCOUNTER — Encounter: Payer: Self-pay | Admitting: Family Medicine

## 2014-03-07 VITALS — BP 108/64 | HR 81 | Temp 99.4°F | Wt 203.8 lb

## 2014-03-07 DIAGNOSIS — J209 Acute bronchitis, unspecified: Secondary | ICD-10-CM

## 2014-03-07 DIAGNOSIS — J40 Bronchitis, not specified as acute or chronic: Secondary | ICD-10-CM | POA: Insufficient documentation

## 2014-03-07 DIAGNOSIS — J208 Acute bronchitis due to other specified organisms: Secondary | ICD-10-CM

## 2014-03-07 DIAGNOSIS — J4521 Mild intermittent asthma with (acute) exacerbation: Secondary | ICD-10-CM

## 2014-03-07 MED ORDER — METHYLPREDNISOLONE ACETATE 80 MG/ML IJ SUSP
80.0000 mg | Freq: Once | INTRAMUSCULAR | Status: AC
  Administered 2014-03-07: 80 mg via INTRAMUSCULAR

## 2014-03-07 MED ORDER — BECLOMETHASONE DIPROPIONATE 80 MCG/ACT IN AERS: 2.0000 | INHALATION_SPRAY | Freq: Two times a day (BID) | RESPIRATORY_TRACT | Status: DC

## 2014-03-07 MED ORDER — IPRATROPIUM-ALBUTEROL 0.5-2.5 (3) MG/3ML IN SOLN
3.0000 mL | RESPIRATORY_TRACT | Status: DC
  Administered 2014-03-07: 3 mL via RESPIRATORY_TRACT

## 2014-03-07 MED ORDER — AZITHROMYCIN 250 MG PO TABS: ORAL_TABLET | ORAL | Status: DC

## 2014-03-07 NOTE — Progress Notes (Signed)
Pre visit review using our clinic review tool, if applicable. No additional management support is needed unless otherwise documented below in the visit note. 

## 2014-03-07 NOTE — Patient Instructions (Signed)

## 2014-03-07 NOTE — Progress Notes (Signed)
Subjective:    Patient ID: Jasmine Mclaughlin, female    DOB: 12-04-1950, 64 y.o.   MRN: [id number]  HPI  Patient here for sob, congestion, cough-productive, x since Friday.  No otc meds.  Past Medical History  Diagnosis Date  . Jasmine polyps   . Genital warts   . Asthma   . Shortness of breath     pt./ reports that she is out of shape  . Arthritis     degenerative back, joint disturbance "everywhere "    Review of Systems  Constitutional: Positive for chills. Negative for fever.  HENT: Positive for congestion, postnasal drip, rhinorrhea and sinus pressure. Negative for ear discharge, ear pain and sore throat.   Respiratory: Positive for cough, chest tightness, shortness of breath and wheezing.   Cardiovascular: Negative for chest pain, palpitations and leg swelling.  Allergic/Immunologic: Negative for environmental allergies.  Psychiatric/Behavioral: Negative for suicidal ideas, sleep disturbance, self-injury, dysphoric mood and decreased concentration. The patient is not nervous/anxious and is not hyperactive.        Objective:    Physical Exam  Constitutional: She is oriented to person, place, and time. She appears well-developed and well-nourished. No distress.  HENT:  Head: Normocephalic and atraumatic.  TMs normal bilaterally Mild nasal congestion Throat w/out erythema, edema, or exudate  Eyes: Conjunctivae and EOM are normal. Pupils are equal, round, and reactive to light.  Neck: Normal range of motion. Neck supple.  Cardiovascular: Normal rate, regular rhythm, normal heart sounds and intact distal pulses.   No murmur heard. Pulmonary/Chest: Effort normal. No accessory muscle usage. No respiratory distress. She has decreased breath sounds. She has wheezes. She has no rales. She exhibits no tenderness.  + hacking cough + wheezing exp-- b/l--- cleared with duoneb  Lymphadenopathy:    She has no cervical adenopathy.  Neurological: She is alert and oriented to person,  place, and time.  Psychiatric: She has a normal mood and affect. Her behavior is normal. Judgment and thought content normal.    BP 108/64 mmHg  Pulse 81  Temp(Src) 99.4 F (37.4 C) (Oral)  Wt 203 lb 12.8 oz (92.443 kg)  SpO2 95% Wt Readings from Last 3 Encounters:  03/07/14 203 lb 12.8 oz (92.443 kg)  02/15/14 213 lb (96.616 kg)  01/24/14 207 lb 2 oz (93.951 kg)     Lab Results  Component Value Date   WBC 9.8 02/15/2014   HGB 12.6 02/15/2014   HCT 37.7 02/15/2014   PLT 391.0 02/15/2014   GLUCOSE 89 02/15/2014   CHOL 184 02/15/2014   TRIG 133.0 02/15/2014   HDL 47.30 02/15/2014   LDLCALC 110* 02/15/2014   ALT 12 02/15/2014   AST 14 02/15/2014   NA 139 02/15/2014   K 4.5 02/15/2014   CL 106 02/15/2014   CREATININE 0.83 02/15/2014   BUN 19 02/15/2014   CO2 28 02/15/2014   TSH 1.17 02/15/2014   INR 1.02 04/28/2012    Dg Chest 2 View  02/15/2014   CLINICAL DATA:  Asthma and shortness of breath since last chest x-ray 2 years ago. Past medical history: Active smoker  EXAM: CHEST  2 VIEW  COMPARISON:  04/28/2012  FINDINGS: There is no focal parenchymal opacity, pleural effusion, or pneumothorax. The heart and mediastinal contours are unremarkable.  The osseous structures are unremarkable.  IMPRESSION: No active cardiopulmonary disease.   Electronically Signed   By: Kathreen Devoid   On: 02/15/2014 15:15       Assessment &  Plan:   Problem List Items Addressed This Visit    Acute bronchitis    Inc qvar to 80 mg con't albuterol Depo medrol 80 mg zithromax rto prn       Other Visit Diagnoses    Asthma with acute exacerbation, mild intermittent    -  Primary    Relevant Medications    QVAR 80 MCG/ACT IN AERS    methylPREDNISolone acetate (DEPO-MEDROL) injection 80 mg (Completed)    ipratropium-albuterol (DUONEB) 0.5-2.5 (3) MG/3ML nebulizer solution 3 mL    Acute bronchitis due to other specified organisms        Relevant Medications    azithromycin (ZITHROMAX) tablet         Garnet Koyanagi, DO

## 2014-03-07 NOTE — Assessment & Plan Note (Signed)
Inc qvar to 80 mg con't albuterol Depo medrol 80 mg zithromax rto prn

## 2014-03-09 ENCOUNTER — Encounter: Payer: Self-pay | Admitting: Family Medicine

## 2014-03-09 ENCOUNTER — Telehealth: Payer: Self-pay | Admitting: Family Medicine

## 2014-03-09 ENCOUNTER — Other Ambulatory Visit: Payer: Self-pay | Admitting: Family Medicine

## 2014-03-09 DIAGNOSIS — J208 Acute bronchitis due to other specified organisms: Secondary | ICD-10-CM

## 2014-03-09 NOTE — Telephone Encounter (Signed)
Caller name: Munira Relation to pt: self Call back number:  [phone] Pharmacy:  Reason for call:   Patient states that she did not go back to work today and is requesting a new work note be written stating that she go back to work tomorrow, 3/3. Was seen on Monday and has been out of work since.

## 2014-03-09 NOTE — Telephone Encounter (Signed)
She can have a note for today

## 2014-03-10 NOTE — Telephone Encounter (Signed)
Not has been sent to the patient via mychart.      KP

## 2014-03-10 NOTE — Telephone Encounter (Signed)
Please advise      KP 

## 2014-05-06 ENCOUNTER — Ambulatory Visit (INDEPENDENT_AMBULATORY_CARE_PROVIDER_SITE_OTHER): Payer: 59 | Admitting: Family Medicine

## 2014-05-06 ENCOUNTER — Encounter: Payer: Self-pay | Admitting: Family Medicine

## 2014-05-06 VITALS — BP 122/74 | HR 77 | Temp 99.4°F | Wt 200.4 lb

## 2014-05-06 DIAGNOSIS — J4521 Mild intermittent asthma with (acute) exacerbation: Secondary | ICD-10-CM

## 2014-05-06 DIAGNOSIS — J209 Acute bronchitis, unspecified: Secondary | ICD-10-CM | POA: Diagnosis not present

## 2014-05-06 MED ORDER — IPRATROPIUM-ALBUTEROL 0.5-2.5 (3) MG/3ML IN SOLN
3.0000 mL | Freq: Once | RESPIRATORY_TRACT | Status: AC
  Administered 2014-05-06: 3 mL via RESPIRATORY_TRACT

## 2014-05-06 MED ORDER — METHYLPREDNISOLONE ACETATE 80 MG/ML IJ SUSP
80.0000 mg | Freq: Once | INTRAMUSCULAR | Status: AC
  Administered 2014-05-06: 80 mg via INTRAMUSCULAR

## 2014-05-06 MED ORDER — PROMETHAZINE-DM 6.25-15 MG/5ML PO SYRP: 5.0000 mL | ORAL_SOLUTION | Freq: Four times a day (QID) | ORAL | Status: DC | PRN

## 2014-05-06 MED ORDER — AZITHROMYCIN 250 MG PO TABS: ORAL_TABLET | ORAL | Status: DC

## 2014-05-06 NOTE — Patient Instructions (Signed)

## 2014-05-06 NOTE — Progress Notes (Signed)
Pre visit review using our clinic review tool, if applicable. No additional management support is needed unless otherwise documented below in the visit note. 

## 2014-05-06 NOTE — Progress Notes (Signed)
Patient ID: Jasmine Mclaughlin, female    DOB: 03-15-1950  Age: 64 y.o. MRN: [id number]    Subjective:  Subjective HPI Alle Difabio Starlin presents for exacerbation of asthma---that started Wednesday after working in yard.     Review of Systems  Constitutional: Positive for chills. Negative for fever.  HENT: Positive for congestion, postnasal drip, rhinorrhea and sinus pressure.   Respiratory: Positive for cough, chest tightness, shortness of breath and wheezing.   Cardiovascular: Negative for chest pain, palpitations and leg swelling.  Allergic/Immunologic: Negative for environmental allergies.  Psychiatric/Behavioral: Negative for decreased concentration. The patient is not nervous/anxious.     History Past Medical History  Diagnosis Date  . Jasmine polyps   . Genital warts   . Asthma   . Shortness of breath     pt./ reports that she is out of shape  . Arthritis     degenerative back, joint disturbance "everywhere "    She has past surgical history that includes Knee arthroscopy; Tonsillectomy; Vaginal delivery; Anterior fusion lumbar spine (05/06/2012); and Anterior lat lumbar fusion (N/A, 05/06/2012).   Her family history includes Arthritis in her father and mother; Asthma in her daughter and maternal grandmother; Breast cancer in her maternal grandmother; Jasmine cancer (age of onset: 61) in her father; Heart disease (age of onset: 41) in her father; Stroke in her father.She reports that she has been smoking Cigarettes.  She has a 31.5 pack-year smoking history. She has never used smokeless tobacco. She reports that she drinks alcohol. She reports that she does not use illicit drugs.  Current Outpatient Prescriptions on File Prior to Visit  Medication Sig Dispense Refill  . acetaminophen (TYLENOL) 650 MG CR tablet Take 650 mg by mouth every 8 (eight) hours as needed for pain.    Marland Kitchen albuterol (PROVENTIL HFA;VENTOLIN HFA) 108 (90 BASE) MCG/ACT inhaler Inhale 2 puffs into the lungs every 6 (six) hours  as needed for wheezing or shortness of breath. 1 Inhaler 4  . beclomethasone (QVAR) 80 MCG/ACT inhaler Inhale 2 puffs into the lungs 2 (two) times daily. 1 Inhaler 12  . Calcium Carbonate (CALCIUM 600 PO) Take 1,800 mg by mouth daily.    . CELEBREX 200 MG capsule Take 1 capsule (200 mg total) by mouth 2 (two) times daily. 180 capsule 3  . citalopram (CELEXA) 20 MG tablet Take 1 tablet (20 mg total) by mouth daily. 90 tablet 3  . cyclobenzaprine (FLEXERIL) 5 MG tablet Take 1 tablet (5 mg total) by mouth 3 (three) times daily as needed for muscle spasms. 90 tablet 1  . diphenhydrAMINE (BENADRYL) 50 MG capsule Take 50 mg by mouth at bedtime as needed for sleep.     Marland Kitchen docusate sodium (COLACE) 100 MG capsule Take 100 mg by mouth 3 (three) times daily as needed for constipation.    Marland Kitchen VITAMIN D, CHOLECALCIFEROL, PO Take 1,200 mg by mouth daily.     No current facility-administered medications on file prior to visit.     Objective:  Objective Physical Exam  Constitutional: She is oriented to person, place, and time. She appears well-developed and well-nourished.  HENT:  Right Ear: External ear normal.  Left Ear: External ear normal.  + PND + errythema  Eyes: Conjunctivae are normal. Right eye exhibits no discharge. Left eye exhibits no discharge.  Cardiovascular: Normal rate, regular rhythm and normal heart sounds.   No murmur heard. Pulmonary/Chest: Effort normal. No respiratory distress. She has wheezes. She has no rales. She exhibits no  tenderness.  Musculoskeletal: She exhibits no edema.  Lymphadenopathy:    She has cervical adenopathy.  Neurological: She is alert and oriented to person, place, and time.  Psychiatric: She has a normal mood and affect. Thought content normal.   BP 122/74 mmHg  Pulse 77  Temp(Src) 99.4 F (37.4 C) (Oral)  Wt 200 lb 6.4 oz (90.901 kg)  SpO2 95% Wt Readings from Last 3 Encounters:  05/06/14 200 lb 6.4 oz (90.901 kg)  03/07/14 203 lb 12.8 oz (92.443  kg)  02/15/14 213 lb (96.616 kg)     Lab Results  Component Value Date   WBC 9.8 02/15/2014   HGB 12.6 02/15/2014   HCT 37.7 02/15/2014   PLT 391.0 02/15/2014   GLUCOSE 89 02/15/2014   CHOL 184 02/15/2014   TRIG 133.0 02/15/2014   HDL 47.30 02/15/2014   LDLCALC 110* 02/15/2014   ALT 12 02/15/2014   AST 14 02/15/2014   NA 139 02/15/2014   K 4.5 02/15/2014   CL 106 02/15/2014   CREATININE 0.83 02/15/2014   BUN 19 02/15/2014   CO2 28 02/15/2014   TSH 1.17 02/15/2014   INR 1.02 04/28/2012    Dg Chest 2 View  02/15/2014   CLINICAL DATA:  Asthma and shortness of breath since last chest x-ray 2 years ago. Past medical history: Active smoker  EXAM: CHEST  2 VIEW  COMPARISON:  04/28/2012  FINDINGS: There is no focal parenchymal opacity, pleural effusion, or pneumothorax. The heart and mediastinal contours are unremarkable.  The osseous structures are unremarkable.  IMPRESSION: No active cardiopulmonary disease.   Electronically Signed   By: Kathreen Devoid   On: 02/15/2014 15:15     Assessment & Plan:  Plan I am having Ms. Belisle maintain her diphenhydrAMINE, docusate sodium, Calcium Carbonate (CALCIUM 600 PO), (VITAMIN D, CHOLECALCIFEROL, PO), acetaminophen, albuterol, citalopram, CELEBREX, cyclobenzaprine, beclomethasone, azithromycin, and promethazine-dextromethorphan. We will stop administering ipratropium-albuterol. Additionally, we administered ipratropium-albuterol and methylPREDNISolone acetate.  Meds ordered this encounter  Medications  . DISCONTD: azithromycin (ZITHROMAX Z-PAK) 250 MG tablet    Sig: As directed    Dispense:  6 each    Refill:  0  . DISCONTD: promethazine-dextromethorphan (PROMETHAZINE-DM) 6.25-15 MG/5ML syrup    Sig: Take 5 mLs by mouth 4 (four) times daily as needed for cough.    Dispense:  180 mL    Refill:  0  . azithromycin (ZITHROMAX Z-PAK) 250 MG tablet    Sig: As directed    Dispense:  6 each    Refill:  0  . promethazine-dextromethorphan  (PROMETHAZINE-DM) 6.25-15 MG/5ML syrup    Sig: Take 5 mLs by mouth 4 (four) times daily as needed for cough.    Dispense:  180 mL    Refill:  0  . ipratropium-albuterol (DUONEB) 0.5-2.5 (3) MG/3ML nebulizer solution 3 mL    Sig:   . methylPREDNISolone acetate (DEPO-MEDROL) injection 80 mg    Sig:     Problem List Items Addressed This Visit    Acute bronchitis   Relevant Medications   azithromycin (ZITHROMAX Z-PAK) 250 MG tablet   promethazine-dextromethorphan (PROMETHAZINE-DM) 6.25-15 MG/5ML syrup   ipratropium-albuterol (DUONEB) 0.5-2.5 (3) MG/3ML nebulizer solution 3 mL (Completed)   methylPREDNISolone acetate (DEPO-MEDROL) injection 80 mg (Completed)    Other Visit Diagnoses    Asthma with acute exacerbation, mild intermittent    -  Primary    Relevant Medications    ipratropium-albuterol (DUONEB) 0.5-2.5 (3) MG/3ML nebulizer solution 3 mL (Completed)    methylPREDNISolone  acetate (DEPO-MEDROL) injection 80 mg (Completed)       Follow-up: Return if symptoms worsen or fail to improve.  Garnet Koyanagi, DO

## 2014-07-01 ENCOUNTER — Encounter: Payer: Self-pay | Admitting: Medical

## 2014-07-01 ENCOUNTER — Ambulatory Visit (INDEPENDENT_AMBULATORY_CARE_PROVIDER_SITE_OTHER): Payer: 59 | Admitting: Medical

## 2014-07-01 VITALS — BP 133/72 | HR 88 | Temp 98.9°F | Ht 64.0 in | Wt 203.6 lb

## 2014-07-01 DIAGNOSIS — R059 Cough, unspecified: Secondary | ICD-10-CM

## 2014-07-01 DIAGNOSIS — J4541 Moderate persistent asthma with (acute) exacerbation: Secondary | ICD-10-CM

## 2014-07-01 DIAGNOSIS — J45901 Unspecified asthma with (acute) exacerbation: Secondary | ICD-10-CM | POA: Insufficient documentation

## 2014-07-01 DIAGNOSIS — R05 Cough: Secondary | ICD-10-CM | POA: Diagnosis not present

## 2014-07-01 MED ORDER — IPRATROPIUM-ALBUTEROL 0.5-2.5 (3) MG/3ML IN SOLN
3.0000 mL | Freq: Four times a day (QID) | RESPIRATORY_TRACT | Status: DC
  Administered 2014-07-01: 3 mL via RESPIRATORY_TRACT

## 2014-07-01 MED ORDER — METHYLPREDNISOLONE ACETATE 40 MG/ML IJ SUSP
40.0000 mg | Freq: Once | INTRAMUSCULAR | Status: AC
  Administered 2014-07-01: 40 mg via INTRAMUSCULAR

## 2014-07-01 MED ORDER — DOXYCYCLINE HYCLATE 100 MG PO TABS: 100.0000 mg | ORAL_TABLET | Freq: Two times a day (BID) | ORAL | Status: DC

## 2014-07-01 MED ORDER — PREDNISONE 20 MG PO TABS: ORAL_TABLET | ORAL | Status: DC

## 2014-07-01 NOTE — Progress Notes (Signed)
Pre visit review using our clinic review tool, if applicable. No additional management support is needed unless otherwise documented below in the visit note. 

## 2014-07-01 NOTE — Assessment & Plan Note (Addendum)
Gave duoneb treatment in office today. Will give depo medrol 40 mg Im. Rx prednisone 20 mg taper over next 6 days. Rx doxycycline if infectious signs and sympotms over the weekend.  Continue qvar and albuterol cxr today.   Follow up in 7 days or as needed

## 2014-07-01 NOTE — Progress Notes (Signed)
Subjective:    Patient ID: Jasmine Mclaughlin, female    DOB: 11-12-1950, 64 y.o.   MRN: [id number]  HPI  Pt in stating she has been wheezing some all the time. Obvious audible wheeze. Feels sob. This started on wed night. No leg pain. Pt used albutetrol last night and this morning. Pt has been using qvar every day as preventative measure. Pt last asthma flare was in April.   Pt is not diabetic.    Review of Systems  Constitutional: Negative for fever, chills and fatigue.  Respiratory: Positive for cough, shortness of breath and wheezing. Negative for chest tightness.        Mostly dry. But occasional mild productive.   Cardiovascular: Negative for chest pain and palpitations.  Gastrointestinal: Negative for abdominal pain.  Musculoskeletal: Negative for back pain.       No leg pain. No popliteal pain.  Neurological: Negative for dizziness and headaches.  Hematological: Negative for adenopathy. Does not bruise/bleed easily.  Psychiatric/Behavioral: Negative for behavioral problems and confusion.     Past Medical History  Diagnosis Date  . Jasmine polyps   . Genital warts   . Asthma   . Shortness of breath     pt./ reports that she is out of shape  . Arthritis     degenerative back, joint disturbance "everywhere "    History   Social History  . Marital Status: Single    Spouse Name: N/A  . Number of Children: 1  . Years of Education: N/A   Occupational History  . [employer]     sits in front of computer   Social History Main Topics  . Smoking status: Current Every Day Smoker -- 0.70 packs/day for 45 years    Types: Cigarettes  . Smokeless tobacco: Never Used     Comment: she is thinking about it   . Alcohol Use: Yes     Comment: 2 times per month  . Drug Use: No  . Sexual Activity:    Partners: Male   Other Topics Concern  . Not on file   Social History Narrative   1 caffeine drinks daily     Past Surgical History  Procedure Laterality Date  . Knee arthroscopy        R knee 01/2012, for ligament tears  . Tonsillectomy    . Vaginal delivery      x1  . Anterior fusion lumbar spine  05/06/2012  . Anterior lat lumbar fusion N/A 05/06/2012    Procedure: ANTERIOR LATERAL LUMBAR FUSION 3 LEVELS;  Surgeon: Sinclair Ship, MD;  Location: [facility];  Service: Orthopedics;  Laterality: N/A;  Lateral interbody fusion, lumbar 2-3,lumbar 3-4, lumbar 4-5 with instrumentation and allograft.    Family History  Problem Relation Age of Onset  . Arthritis Mother   . Arthritis Father   . Stroke Father   . Jasmine cancer Father 62  . Heart disease Father 63    chf  . Breast cancer Maternal Grandmother   . Asthma Maternal Grandmother   . Asthma Daughter     No Known Allergies  Current Outpatient Prescriptions on File Prior to Visit  Medication Sig Dispense Refill  . acetaminophen (TYLENOL) 650 MG CR tablet Take 650 mg by mouth every 8 (eight) hours as needed for pain.    . beclomethasone (QVAR) 80 MCG/ACT inhaler Inhale 2 puffs into the lungs 2 (two) times daily. 1 Inhaler 12  . Calcium Carbonate (CALCIUM 600 PO) Take 1,800  mg by mouth daily.    . CELEBREX 200 MG capsule Take 1 capsule (200 mg total) by mouth 2 (two) times daily. 180 capsule 3  . citalopram (CELEXA) 20 MG tablet Take 1 tablet (20 mg total) by mouth daily. 90 tablet 3  . cyclobenzaprine (FLEXERIL) 5 MG tablet Take 1 tablet (5 mg total) by mouth 3 (three) times daily as needed for muscle spasms. 90 tablet 1  . diphenhydrAMINE (BENADRYL) 50 MG capsule Take 50 mg by mouth at bedtime as needed for sleep.     Marland Kitchen docusate sodium (COLACE) 100 MG capsule Take 100 mg by mouth 3 (three) times daily as needed for constipation.    . promethazine-dextromethorphan (PROMETHAZINE-DM) 6.25-15 MG/5ML syrup Take 5 mLs by mouth 4 (four) times daily as needed for cough. 180 mL 0  . VITAMIN D, CHOLECALCIFEROL, PO Take 1,200 mg by mouth daily.     No current facility-administered medications on file prior to visit.     BP 133/72 mmHg  Pulse 88  Temp(Src) 98.9 F (37.2 C) (Oral)  Ht 5\' 4"  (1.626 m)  Wt 203 lb 9.6 oz (92.352 kg)  BMI 34.93 kg/m2  SpO2 94%       Objective:   Physical Exam  General  Mental Status - Alert. General Appearance - Well groomed. Not in acute distress.  Skin Rashes- No Rashes.  HEENT Head- Normal. Ear Auditory Canal - Left- Normal. Right - Normal.Tympanic Membrane- Left- Normal. Right- Normal. Eye Sclera/Conjunctiva- Left- Normal. Right- Normal. Nose & Sinuses Nasal Mucosa- Left-  Not oggy or Congested. Right-  Not  boggy or Congested. Mouth & Throat Lips: Upper Lip- Normal: no dryness, cracking, pallor, cyanosis, or vesicular eruption. Lower Lip-Normal: no dryness, cracking, pallor, cyanosis or vesicular eruption. Buccal Mucosa- Bilateral- No Aphthous ulcers. Oropharynx- No Discharge or Erythema. Tonsils: Characteristics- Bilateral- No Erythema or Congestion. Size/Enlargement- Bilateral- No enlargement. Discharge- bilateral-None.  Neck Neck- Supple. No Masses. No jvd.   Chest and Lung Exam Auscultation: Breath Sounds:- even but mild labored. Moderate scattered wheezing. (Post neb much deeper respiration) Pt reports much improved.  Cardiovascular Auscultation:Rythm- Regular, rate and rhythm. Murmurs & Other Heart Sounds:Ausculatation of the heart reveal- No Murmurs.  Lymphatic Head & Neck General Head & Neck Lymphatics: Bilateral: Description- No Localized lymphadenopathy.  Lower ext- no pedal edema. Negative homans signs.       Assessment & Plan:  LPN initially got 397%?? Not sure I checked and reading was 94%-95%. I kept monitor on finger for a while.  Post neb cllinically much improved.

## 2014-07-01 NOTE — Patient Instructions (Signed)
Asthma with acute exacerbation Gave duoneb treatment in office today. Will give depo medrol 40 mg Im. Rx prednisone 20 mg taper over next 6 days. Rx doxycycline if infectious signs and sympotms over the weekend.  Continue qvar and albuterol cxr today.   Follow up in 7 days or as needed

## 2014-07-01 NOTE — Addendum Note (Signed)
Addended by: Bunnie Domino on: 07/01/2014 03:55 PM   Modules accepted: Orders

## 2014-07-26 ENCOUNTER — Ambulatory Visit (INDEPENDENT_AMBULATORY_CARE_PROVIDER_SITE_OTHER): Payer: 59 | Admitting: Family Medicine

## 2014-07-26 ENCOUNTER — Encounter: Payer: Self-pay | Admitting: Family Medicine

## 2014-07-26 VITALS — BP 124/74 | HR 78 | Temp 99.4°F | Resp 20 | Ht 64.0 in | Wt 205.0 lb

## 2014-07-26 DIAGNOSIS — J45901 Unspecified asthma with (acute) exacerbation: Secondary | ICD-10-CM

## 2014-07-26 DIAGNOSIS — J209 Acute bronchitis, unspecified: Secondary | ICD-10-CM

## 2014-07-26 DIAGNOSIS — J0110 Acute frontal sinusitis, unspecified: Secondary | ICD-10-CM | POA: Diagnosis not present

## 2014-07-26 MED ORDER — PREDNISONE 10 MG PO TABS: ORAL_TABLET | ORAL | Status: DC

## 2014-07-26 MED ORDER — PROMETHAZINE-DM 6.25-15 MG/5ML PO SYRP: 5.0000 mL | ORAL_SOLUTION | Freq: Four times a day (QID) | ORAL | Status: DC | PRN

## 2014-07-26 MED ORDER — ALBUTEROL SULFATE (2.5 MG/3ML) 0.083% IN NEBU: 2.5000 mg | INHALATION_SOLUTION | Freq: Four times a day (QID) | RESPIRATORY_TRACT | Status: DC | PRN

## 2014-07-26 MED ORDER — AMOXICILLIN-POT CLAVULANATE 875-125 MG PO TABS: 1.0000 | ORAL_TABLET | Freq: Two times a day (BID) | ORAL | Status: DC

## 2014-07-26 MED ORDER — METHYLPREDNISOLONE ACETATE 80 MG/ML IJ SUSP
80.0000 mg | Freq: Once | INTRAMUSCULAR | Status: AC
  Administered 2014-07-26: 80 mg via INTRAMUSCULAR

## 2014-07-26 MED ORDER — IPRATROPIUM-ALBUTEROL 0.5-2.5 (3) MG/3ML IN SOLN
3.0000 mL | Freq: Once | RESPIRATORY_TRACT | Status: AC
  Administered 2014-07-26: 3 mL via RESPIRATORY_TRACT

## 2014-07-26 NOTE — Progress Notes (Signed)
Pre visit review using our clinic review tool, if applicable. No additional management support is needed unless otherwise documented below in the visit note. 

## 2014-07-26 NOTE — Patient Instructions (Signed)

## 2014-07-26 NOTE — Progress Notes (Signed)
Patient ID: Jasmine Mclaughlin, female    DOB: 01-23-1950  Age: 64 y.o. MRN: [id number]    Subjective:  Subjective HPI Jasmine Mclaughlin presents c/o chest congestion and nasal congestion since Sunday.    Review of Systems  Constitutional: Positive for chills. Negative for fever.  HENT: Positive for congestion, postnasal drip, rhinorrhea and sinus pressure.   Respiratory: Positive for cough, chest tightness, shortness of breath and wheezing.   Cardiovascular: Negative for chest pain, palpitations and leg swelling.  Allergic/Immunologic: Negative for environmental allergies.    History Past Medical History  Diagnosis Date  . Jasmine polyps   . Genital warts   . Asthma   . Shortness of breath     pt./ reports that she is out of shape  . Arthritis     degenerative back, joint disturbance "everywhere "    She has past surgical history that includes Knee arthroscopy; Tonsillectomy; Vaginal delivery; Anterior fusion lumbar spine (05/06/2012); and Anterior lat lumbar fusion (N/A, 05/06/2012).   Her family history includes Arthritis in her father and mother; Asthma in her daughter and maternal grandmother; Breast cancer in her maternal grandmother; Jasmine cancer (age of onset: 90) in her father; Heart disease (age of onset: 97) in her father; Stroke in her father.She reports that she has been smoking Cigarettes.  She has a 31.5 pack-year smoking history. She has never used smokeless tobacco. She reports that she drinks alcohol. She reports that she does not use illicit drugs.  Current Outpatient Prescriptions on File Prior to Visit  Medication Sig Dispense Refill  . acetaminophen (TYLENOL) 650 MG CR tablet Take 650 mg by mouth every 8 (eight) hours as needed for pain.    . beclomethasone (QVAR) 80 MCG/ACT inhaler Inhale 2 puffs into the lungs 2 (two) times daily. 1 Inhaler 12  . Calcium Carbonate (CALCIUM 600 PO) Take 1,800 mg by mouth daily.    . CELEBREX 200 MG capsule Take 1 capsule (200 mg total) by  mouth 2 (two) times daily. 180 capsule 3  . citalopram (CELEXA) 20 MG tablet Take 1 tablet (20 mg total) by mouth daily. 90 tablet 3  . cyclobenzaprine (FLEXERIL) 5 MG tablet Take 1 tablet (5 mg total) by mouth 3 (three) times daily as needed for muscle spasms. 90 tablet 1  . diphenhydrAMINE (BENADRYL) 50 MG capsule Take 50 mg by mouth at bedtime as needed for sleep.     Marland Kitchen docusate sodium (COLACE) 100 MG capsule Take 100 mg by mouth 3 (three) times daily as needed for constipation.    Marland Kitchen VITAMIN D, CHOLECALCIFEROL, PO Take 1,200 mg by mouth daily.     No current facility-administered medications on file prior to visit.     Objective:  Objective Physical Exam  Constitutional: She is oriented to person, place, and time. She appears well-developed and well-nourished.  HENT:  Right Ear: External ear normal.  Left Ear: External ear normal.  + PND + errythema  Eyes: Conjunctivae are normal. Right eye exhibits no discharge. Left eye exhibits no discharge.  Cardiovascular: Normal rate, regular rhythm and normal heart sounds.   No murmur heard. Pulmonary/Chest: Effort normal. No respiratory distress. She has wheezes. She has no rales. She exhibits no tenderness.  Breathe sounds clear with neb  Musculoskeletal: She exhibits no edema.  Lymphadenopathy:    She has cervical adenopathy.  Neurological: She is alert and oriented to person, place, and time.   BP 124/74 mmHg  Pulse 78  Temp(Src) 99.4 F (37.4  C) (Oral)  Resp 20  Ht 5\' 4"  (1.626 m)  Wt 205 lb (92.987 kg)  BMI 35.17 kg/m2  SpO2 98% Wt Readings from Last 3 Encounters:  07/26/14 205 lb (92.987 kg)  07/01/14 203 lb 9.6 oz (92.352 kg)  05/06/14 200 lb 6.4 oz (90.901 kg)     Lab Results  Component Value Date   WBC 9.8 02/15/2014   HGB 12.6 02/15/2014   HCT 37.7 02/15/2014   PLT 391.0 02/15/2014   GLUCOSE 89 02/15/2014   CHOL 184 02/15/2014   TRIG 133.0 02/15/2014   HDL 47.30 02/15/2014   LDLCALC 110* 02/15/2014   ALT 12  02/15/2014   AST 14 02/15/2014   NA 139 02/15/2014   K 4.5 02/15/2014   CL 106 02/15/2014   CREATININE 0.83 02/15/2014   BUN 19 02/15/2014   CO2 28 02/15/2014   TSH 1.17 02/15/2014   INR 1.02 04/28/2012    Dg Chest 2 View  02/15/2014   CLINICAL DATA:  Asthma and shortness of breath since last chest x-ray 2 years ago. Past medical history: Active smoker  EXAM: CHEST  2 VIEW  COMPARISON:  04/28/2012  FINDINGS: There is no focal parenchymal opacity, pleural effusion, or pneumothorax. The heart and mediastinal contours are unremarkable.  The osseous structures are unremarkable.  IMPRESSION: No active cardiopulmonary disease.   Electronically Signed   By: Kathreen Devoid   On: 02/15/2014 15:15     Assessment & Plan:  Plan I have discontinued Jasmine Mclaughlin's promethazine-dextromethorphan, predniSONE, and doxycycline. I am also having her start on predniSONE, amoxicillin-clavulanate, promethazine-dextromethorphan, and albuterol. Additionally, I am having her maintain her diphenhydrAMINE, docusate sodium, Calcium Carbonate (CALCIUM 600 PO), (VITAMIN D, CHOLECALCIFEROL, PO), acetaminophen, citalopram, CELEBREX, cyclobenzaprine, and beclomethasone. We will stop administering ipratropium-albuterol. Additionally, we administered ipratropium-albuterol and methylPREDNISolone acetate.  Meds ordered this encounter  Medications  . ipratropium-albuterol (DUONEB) 0.5-2.5 (3) MG/3ML nebulizer solution 3 mL    Sig:   . predniSONE (DELTASONE) 10 MG tablet    Sig: 3 po qd for 3 days then 2 po qd for 3 days the 1 po qd for 3 days    Dispense:  18 tablet    Refill:  0  . amoxicillin-clavulanate (AUGMENTIN) 875-125 MG per tablet    Sig: Take 1 tablet by mouth 2 (two) times daily.    Dispense:  20 tablet    Refill:  0  . promethazine-dextromethorphan (PROMETHAZINE-DM) 6.25-15 MG/5ML syrup    Sig: Take 5 mLs by mouth 4 (four) times daily as needed.    Dispense:  118 mL    Refill:  0  . albuterol (PROVENTIL) (2.5  MG/3ML) 0.083% nebulizer solution    Sig: Take 3 mLs (2.5 mg total) by nebulization every 6 (six) hours as needed for wheezing or shortness of breath.    Dispense:  150 mL    Refill:  1  . methylPREDNISolone acetate (DEPO-MEDROL) injection 80 mg    Sig:     Problem List Items Addressed This Visit    Sinusitis   Relevant Medications   predniSONE (DELTASONE) 10 MG tablet   amoxicillin-clavulanate (AUGMENTIN) 875-125 MG per tablet   promethazine-dextromethorphan (PROMETHAZINE-DM) 6.25-15 MG/5ML syrup   methylPREDNISolone acetate (DEPO-MEDROL) injection 80 mg (Completed)   Acute bronchitis   Relevant Medications   predniSONE (DELTASONE) 10 MG tablet   amoxicillin-clavulanate (AUGMENTIN) 875-125 MG per tablet   methylPREDNISolone acetate (DEPO-MEDROL) injection 80 mg (Completed)    Other Visit Diagnoses    Asthma with exacerbation,  unspecified asthma severity    -  Primary    Relevant Medications    ipratropium-albuterol (DUONEB) 0.5-2.5 (3) MG/3ML nebulizer solution 3 mL (Completed)    predniSONE (DELTASONE) 10 MG tablet    amoxicillin-clavulanate (AUGMENTIN) 875-125 MG per tablet    promethazine-dextromethorphan (PROMETHAZINE-DM) 6.25-15 MG/5ML syrup    albuterol (PROVENTIL) (2.5 MG/3ML) 0.083% nebulizer solution    methylPREDNISolone acetate (DEPO-MEDROL) injection 80 mg (Completed)       Follow-up: Return if symptoms worsen or fail to improve.  Garnet Koyanagi, DO

## 2014-08-17 ENCOUNTER — Telehealth: Payer: Self-pay

## 2014-08-17 NOTE — Telephone Encounter (Signed)
I spoke with the patient and made her aware that she does not qualify for oxygen at this time. However it does look like she could benefit from a home sleep study. She verbalized understanding and agreed to have it done, she will call back with her neck circumference.     KP

## 2014-08-18 ENCOUNTER — Encounter: Payer: Self-pay | Admitting: Family Medicine

## 2014-08-22 ENCOUNTER — Encounter: Payer: Self-pay | Admitting: Family Medicine

## 2014-08-26 ENCOUNTER — Telehealth: Payer: Self-pay | Admitting: Family Medicine

## 2014-08-26 NOTE — Telephone Encounter (Signed)
Per the order signed by Dr.Lowne. Dx code G47.30 faxed back to [facility], VM left for Butch Penny.     KP

## 2014-08-26 NOTE — Telephone Encounter (Signed)
Caller name:Donna-[facility]  Relation to pt: Call back [place]:  Reason for call: please call in reference to the dx code states did not have a valid [employer] approved dx code states the codes were faxed on 2022/08/27

## 2014-08-29 ENCOUNTER — Encounter: Payer: Self-pay | Admitting: Family Medicine

## 2014-08-29 DIAGNOSIS — M5440 Lumbago with sciatica, unspecified side: Secondary | ICD-10-CM

## 2014-08-29 MED ORDER — CYCLOBENZAPRINE HCL 5 MG PO TABS: 5.0000 mg | ORAL_TABLET | Freq: Three times a day (TID) | ORAL | Status: DC | PRN

## 2014-08-29 NOTE — Telephone Encounter (Signed)
Last filled 02/15/14 #90 with 1  Please advise       KP

## 2014-08-29 NOTE — Telephone Encounter (Signed)
Ok to refill x 1  

## 2014-09-26 ENCOUNTER — Encounter: Payer: Self-pay | Admitting: Family Medicine

## 2014-09-26 DIAGNOSIS — G4730 Sleep apnea, unspecified: Secondary | ICD-10-CM

## 2014-09-26 NOTE — Telephone Encounter (Signed)
She had several desaturations per hour--- if she is not sleeping well or not rested it would be a good idea to see a sleep specialist for a formal sleep study--- she just had an overnight oximetry

## 2014-10-10 ENCOUNTER — Encounter: Payer: Self-pay | Admitting: Family Medicine

## 2014-10-10 ENCOUNTER — Ambulatory Visit (INDEPENDENT_AMBULATORY_CARE_PROVIDER_SITE_OTHER): Payer: 59 | Admitting: Family Medicine

## 2014-10-10 VITALS — BP 132/68 | HR 79 | Resp 22 | Wt 202.4 lb

## 2014-10-10 DIAGNOSIS — J208 Acute bronchitis due to other specified organisms: Secondary | ICD-10-CM | POA: Diagnosis not present

## 2014-10-10 DIAGNOSIS — J45901 Unspecified asthma with (acute) exacerbation: Secondary | ICD-10-CM | POA: Diagnosis not present

## 2014-10-10 DIAGNOSIS — J449 Chronic obstructive pulmonary disease, unspecified: Secondary | ICD-10-CM

## 2014-10-10 MED ORDER — IPRATROPIUM-ALBUTEROL 0.5-2.5 (3) MG/3ML IN SOLN
3.0000 mL | Freq: Once | RESPIRATORY_TRACT | Status: AC
  Administered 2014-10-10: 3 mL via RESPIRATORY_TRACT

## 2014-10-10 MED ORDER — METHYLPREDNISOLONE ACETATE 80 MG/ML IJ SUSP
80.0000 mg | Freq: Once | INTRAMUSCULAR | Status: AC
  Administered 2014-10-10: 80 mg via INTRAMUSCULAR

## 2014-10-10 MED ORDER — AZITHROMYCIN 250 MG PO TABS: ORAL_TABLET | ORAL | Status: DC

## 2014-10-10 MED ORDER — IPRATROPIUM-ALBUTEROL 0.5-2.5 (3) MG/3ML IN SOLN: 3.0000 mL | Freq: Four times a day (QID) | RESPIRATORY_TRACT | Status: DC | PRN

## 2014-10-10 MED ORDER — PREDNISONE 10 MG PO TABS: ORAL_TABLET | ORAL | Status: DC

## 2014-10-10 NOTE — Progress Notes (Signed)
Pre visit review using our clinic review tool, if applicable. No additional management support is needed unless otherwise documented below in the visit note. 

## 2014-10-10 NOTE — Patient Instructions (Signed)

## 2014-10-11 NOTE — Progress Notes (Signed)
Patient ID: Jasmine Mclaughlin, female    DOB: 06-18-1950  Age: 64 y.o. MRN: [id number]    Subjective:  Subjective HPI Jasmine Mclaughlin presents c/o cough and wheezing since Friday.  She has been using the neb and albuterol inh a lot.  No fever,  Cough is sometimes productive  Review of Systems  Constitutional: Negative for fever and chills.  HENT: Positive for postnasal drip, rhinorrhea and sinus pressure. Negative for congestion.   Respiratory: Positive for cough, chest tightness, shortness of breath and wheezing.   Cardiovascular: Negative for chest pain, palpitations and leg swelling.  Allergic/Immunologic: Negative for environmental allergies.    History Past Medical History  Diagnosis Date  . Jasmine polyps   . Genital warts   . Asthma   . Shortness of breath     pt./ reports that she is out of shape  . Arthritis     degenerative back, joint disturbance "everywhere "    She has past surgical history that includes Knee arthroscopy; Tonsillectomy; Vaginal delivery; Anterior fusion lumbar spine (05/06/2012); and Anterior lat lumbar fusion (N/A, 05/06/2012).   Her family history includes Arthritis in her father and mother; Asthma in her daughter and maternal grandmother; Breast cancer in her maternal grandmother; Jasmine cancer (age of onset: 1) in her father; Heart disease (age of onset: 66) in her father; Stroke in her father.She reports that she has quit smoking. Her smoking use included Cigarettes. She has a 31.5 pack-year smoking history. She has never used smokeless tobacco. She reports that she drinks alcohol. She reports that she does not use illicit drugs.  Current Outpatient Prescriptions on File Prior to Visit  Medication Sig Dispense Refill  . acetaminophen (TYLENOL) 650 MG CR tablet Take 650 mg by mouth every 8 (eight) hours as needed for pain.    . beclomethasone (QVAR) 80 MCG/ACT inhaler Inhale 2 puffs into the lungs 2 (two) times daily. 1 Inhaler 12  . Calcium Carbonate (CALCIUM  600 PO) Take 1,800 mg by mouth daily.    . CELEBREX 200 MG capsule Take 1 capsule (200 mg total) by mouth 2 (two) times daily. 180 capsule 3  . citalopram (CELEXA) 20 MG tablet Take 1 tablet (20 mg total) by mouth daily. 90 tablet 3  . cyclobenzaprine (FLEXERIL) 5 MG tablet Take 1 tablet (5 mg total) by mouth 3 (three) times daily as needed for muscle spasms. 90 tablet 0  . diphenhydrAMINE (BENADRYL) 50 MG capsule Take 50 mg by mouth at bedtime as needed for sleep.     Marland Kitchen docusate sodium (COLACE) 100 MG capsule Take 100 mg by mouth 3 (three) times daily as needed for constipation.    Marland Kitchen VITAMIN D, CHOLECALCIFEROL, PO Take 1,200 mg by mouth daily.     No current facility-administered medications on file prior to visit.     Objective:  Objective Physical Exam  Constitutional: She is oriented to person, place, and time. She appears well-developed and well-nourished.  HENT:  Right Ear: External ear normal.  Left Ear: External ear normal.  + PND + errythema  Eyes: Conjunctivae are normal. Right eye exhibits no discharge. Left eye exhibits no discharge.  Cardiovascular: Normal rate, regular rhythm and normal heart sounds.   No murmur heard. Pulmonary/Chest: She is in respiratory distress. She has wheezes. She has no rales. She exhibits no tenderness.  Lungs cleared with duo neb in neb  Musculoskeletal: She exhibits no edema.  Lymphadenopathy:    She has cervical adenopathy.  Neurological: She  is alert and oriented to person, place, and time.  Psychiatric: She has a normal mood and affect. Her behavior is normal.  Nursing note and vitals reviewed.  BP 132/68 mmHg  Pulse 79  Resp 22  Wt 202 lb 6.4 oz (91.808 kg)  SpO2 96% Wt Readings from Last 3 Encounters:  10/10/14 202 lb 6.4 oz (91.808 kg)  07/26/14 205 lb (92.987 kg)  07/01/14 203 lb 9.6 oz (92.352 kg)     Lab Results  Component Value Date   WBC 9.8 02/15/2014   HGB 12.6 02/15/2014   HCT 37.7 02/15/2014   PLT 391.0  02/15/2014   GLUCOSE 89 02/15/2014   CHOL 184 02/15/2014   TRIG 133.0 02/15/2014   HDL 47.30 02/15/2014   LDLCALC 110* 02/15/2014   ALT 12 02/15/2014   AST 14 02/15/2014   NA 139 02/15/2014   K 4.5 02/15/2014   CL 106 02/15/2014   CREATININE 0.83 02/15/2014   BUN 19 02/15/2014   CO2 28 02/15/2014   TSH 1.17 02/15/2014   INR 1.02 04/28/2012    Dg Chest 2 View  02/15/2014   CLINICAL DATA:  Asthma and shortness of breath since last chest x-ray 2 years ago. Past medical history: Active smoker  EXAM: CHEST  2 VIEW  COMPARISON:  04/28/2012  FINDINGS: There is no focal parenchymal opacity, pleural effusion, or pneumothorax. The heart and mediastinal contours are unremarkable.  The osseous structures are unremarkable.  IMPRESSION: No active cardiopulmonary disease.   Electronically Signed   By: Kathreen Devoid   On: 02/15/2014 15:15     Assessment & Plan:  Plan I have discontinued Jasmine Mclaughlin's predniSONE, amoxicillin-clavulanate, promethazine-dextromethorphan, and albuterol. I am also having her start on ipratropium-albuterol, predniSONE, and azithromycin. Additionally, I am having her maintain her diphenhydrAMINE, docusate sodium, Calcium Carbonate (CALCIUM 600 PO), (VITAMIN D, CHOLECALCIFEROL, PO), acetaminophen, citalopram, CELEBREX, beclomethasone, and cyclobenzaprine. We administered ipratropium-albuterol and methylPREDNISolone acetate.  Meds ordered this encounter  Medications  . ipratropium-albuterol (DUONEB) 0.5-2.5 (3) MG/3ML nebulizer solution 3 mL    Sig:   . methylPREDNISolone acetate (DEPO-MEDROL) injection 80 mg    Sig:   . ipratropium-albuterol (DUONEB) 0.5-2.5 (3) MG/3ML SOLN    Sig: Take 3 mLs by nebulization every 6 (six) hours as needed.    Dispense:  360 mL    Refill:  5  . predniSONE (DELTASONE) 10 MG tablet    Sig: 3 po qd for 3 days then 2 po qd for 3 days the 1 po qd for 3 days    Dispense:  18 tablet    Refill:  0  . azithromycin (ZITHROMAX Z-PAK) 250 MG tablet     Sig: As directed    Dispense:  6 each    Refill:  0    Problem List Items Addressed This Visit    COPD, mild (HCC)   Relevant Medications   ipratropium-albuterol (DUONEB) 0.5-2.5 (3) MG/3ML nebulizer solution 3 mL (Completed)   methylPREDNISolone acetate (DEPO-MEDROL) injection 80 mg (Completed)   ipratropium-albuterol (DUONEB) 0.5-2.5 (3) MG/3ML SOLN   predniSONE (DELTASONE) 10 MG tablet   azithromycin (ZITHROMAX Z-PAK) 250 MG tablet   Asthma with acute exacerbation - Primary   Relevant Medications   ipratropium-albuterol (DUONEB) 0.5-2.5 (3) MG/3ML nebulizer solution 3 mL (Completed)   methylPREDNISolone acetate (DEPO-MEDROL) injection 80 mg (Completed)   ipratropium-albuterol (DUONEB) 0.5-2.5 (3) MG/3ML SOLN   predniSONE (DELTASONE) 10 MG tablet    Other Visit Diagnoses    Acute bronchitis due to other  specified organisms        Relevant Medications    azithromycin (ZITHROMAX Z-PAK) 250 MG tablet       Follow-up: Return in about 6 months (around 04/10/2015), or if symptoms worsen or fail to improve, for annual exam, fasting.  Garnet Koyanagi, DO

## 2014-10-12 ENCOUNTER — Encounter: Payer: Self-pay | Admitting: Family Medicine

## 2014-10-13 ENCOUNTER — Encounter: Payer: Self-pay | Admitting: Family Medicine

## 2014-10-13 NOTE — Telephone Encounter (Signed)
Ok to give note 

## 2014-10-14 ENCOUNTER — Telehealth: Payer: Self-pay | Admitting: *Deleted

## 2014-10-14 DIAGNOSIS — Z0279 Encounter for issue of other medical certificate: Secondary | ICD-10-CM

## 2014-10-14 NOTE — Telephone Encounter (Signed)
Received FMLA forms via fax from [employer] [place]. Filled out as much as possible and forwarded to Dr. Etter Sjogren. (also see MyChart from 10/13/14 for further info). JG//CMA

## 2014-10-18 ENCOUNTER — Encounter: Payer: Self-pay | Admitting: Family Medicine

## 2014-10-18 NOTE — Telephone Encounter (Signed)
Forms emailed to [email or web address]. JG//CMA

## 2014-10-18 NOTE — Telephone Encounter (Signed)
Completed/signed forms faxed successfully to [employer] at [phone]. Sent for scanning. JG//CMA

## 2014-11-24 ENCOUNTER — Ambulatory Visit (INDEPENDENT_AMBULATORY_CARE_PROVIDER_SITE_OTHER): Payer: 59 | Admitting: Emergency Medicine

## 2014-11-24 ENCOUNTER — Encounter: Payer: Self-pay | Admitting: Emergency Medicine

## 2014-11-24 VITALS — BP 110/70 | HR 75 | Ht 64.5 in | Wt 197.0 lb

## 2014-11-24 DIAGNOSIS — J449 Chronic obstructive pulmonary disease, unspecified: Secondary | ICD-10-CM

## 2014-11-24 NOTE — Progress Notes (Signed)
Subjective:    Patient ID: Jasmine Mclaughlin, female    DOB: 03/18/1950, 64 y.o.   MRN: [id number]  HPI 64 year old woman, former smoker (25 pack years), with a history of osteoarthritis and reported asthma. She received this diagnosis in 10/2013 in the setting of wheeze, cough, dyspnea. She was started on QVAR and albuterol prn. She has subsequently also been given duoneb, uses 3-4x a week. Albuterol does help her wheeze, duonebs can help as well. Neither help as much as they used to do. She has progressed - more longstanding dyspnea, more frequent episodes of cough and wheeze.   In retrospect she has had similar episodes over the last 4-5 years    Review of Systems  Constitutional: Positive for appetite change and unexpected weight change. Negative for fever.  HENT: Positive for congestion. Negative for dental problem, ear pain, nosebleeds, postnasal drip, rhinorrhea, sinus pressure, sneezing, sore throat and trouble swallowing.   Eyes: Negative for redness and itching.  Respiratory: Positive for cough and shortness of breath. Negative for chest tightness and wheezing.   Cardiovascular: Negative for palpitations and leg swelling.  Gastrointestinal: Negative for nausea and vomiting.  Genitourinary: Negative for dysuria.  Musculoskeletal: Negative for joint swelling.  Skin: Negative for rash.  Neurological: Negative for headaches.  Hematological: Does not bruise/bleed easily.  Psychiatric/Behavioral: Negative for dysphoric mood. The patient is not nervous/anxious.     Past Medical History  Diagnosis Date  . Jasmine polyps   . Genital warts   . Asthma   . Shortness of breath     pt./ reports that she is out of shape  . Arthritis     degenerative back, joint disturbance "everywhere "     Family History  Problem Relation Age of Onset  . Arthritis Mother   . Arthritis Father   . Stroke Father   . Jasmine cancer Father 34  . Heart disease Father 69    chf  . Breast cancer Maternal  Grandmother   . Asthma Maternal Grandmother   . Asthma Daughter      Social History   Social History  . Marital Status: Single    Spouse Name: N/A  . Number of Children: 1  . Years of Education: N/A   Occupational History  . [employer]     sits in front of computer   Social History Main Topics  . Smoking status: Former Smoker -- 0.50 packs/day for 45 years    Types: Cigarettes    Quit date: 10/10/2014  . Smokeless tobacco: Never Used     Comment: she is thinking about it   . Alcohol Use: 0.0 oz/week    0 Standard drinks or equivalent per week     Comment: 2 times per month  . Drug Use: No  . Sexual Activity:    Partners: Male   Other Topics Concern  . Not on file   Social History Narrative   1 caffeine drinks daily      No Known Allergies   Outpatient Prescriptions Prior to Visit  Medication Sig Dispense Refill  . acetaminophen (TYLENOL) 650 MG CR tablet Take 650 mg by mouth every 8 (eight) hours as needed for pain.    . beclomethasone (QVAR) 80 MCG/ACT inhaler Inhale 2 puffs into the lungs 2 (two) times daily. 1 Inhaler 12  . Calcium Carbonate (CALCIUM 600 PO) Take 1,800 mg by mouth daily.    . CELEBREX 200 MG capsule Take 1 capsule (200 mg total) by mouth  2 (two) times daily. 180 capsule 3  . citalopram (CELEXA) 20 MG tablet Take 1 tablet (20 mg total) by mouth daily. 90 tablet 3  . cyclobenzaprine (FLEXERIL) 5 MG tablet Take 1 tablet (5 mg total) by mouth 3 (three) times daily as needed for muscle spasms. 90 tablet 0  . diphenhydrAMINE (BENADRYL) 50 MG capsule Take 50 mg by mouth at bedtime as needed for sleep.     Marland Kitchen docusate sodium (COLACE) 100 MG capsule Take 100 mg by mouth 3 (three) times daily as needed for constipation.    Marland Kitchen ipratropium-albuterol (DUONEB) 0.5-2.5 (3) MG/3ML SOLN Take 3 mLs by nebulization every 6 (six) hours as needed. 360 mL 5  . VITAMIN D, CHOLECALCIFEROL, PO Take 1,200 mg by mouth daily.    Marland Kitchen azithromycin (ZITHROMAX Z-PAK) 250 MG tablet As  directed 6 each 0  . predniSONE (DELTASONE) 10 MG tablet 3 po qd for 3 days then 2 po qd for 3 days the 1 po qd for 3 days 18 tablet 0   No facility-administered medications prior to visit.    works as an Optometrist       Objective:   Physical Exam Filed Vitals:   11/24/14 1511  BP: 110/70  Pulse: 75  Height: 5' 4.5" (1.638 m)  Weight: 197 lb (89.359 kg)  SpO2: 94%   Gen: Pleasant, well-nourished, in no distress,  normal affect  ENT: No lesions,  mouth clear,  oropharynx clear, no postnasal drip  Neck: No JVD, no TMG, no carotid bruits  Lungs: No use of accessory muscles, clear without rales or rhonchi  Cardiovascular: RRR, heart sounds normal, no murmur or gallops, no peripheral edema  Musculoskeletal: No deformities, no cyanosis or clubbing  Neuro: alert, non focal  Skin: Warm, no lesions or rashes     Assessment & Plan:  COPD (chronic obstructive pulmonary disease) (HCC) Her symptoms and her frequency of exacerbations are more consistent with COPD. I believe she needs to be treated with long-acting bronchodilators. I'll temporarily stop her Qvar until I can ascertain whether she is going to be a frequent exacerbator  once she is treated. We will perform full PFT at her next office visit

## 2014-11-24 NOTE — Patient Instructions (Signed)
We will start you on Stiolto 2 puffs once a day Please stop Qvar Please stop your DuoNeb for now Continue to have albuterol available to use 2 puffs up to every 4 hours if needed for shortness of breath We will perform full pulmonary function testing at next office visit Follow with Dr Lamonte Sakai in 1 -2 months with full PFT

## 2014-11-24 NOTE — Assessment & Plan Note (Signed)
Her symptoms and her frequency of exacerbations are more consistent with COPD. I believe she needs to be treated with long-acting bronchodilators. I'll temporarily stop her Qvar until I can ascertain whether she is going to be a frequent exacerbator  once she is treated. We will perform full PFT at her next office visit

## 2014-12-19 ENCOUNTER — Encounter: Payer: Self-pay | Admitting: Emergency Medicine

## 2014-12-20 ENCOUNTER — Telehealth: Payer: Self-pay | Admitting: *Deleted

## 2014-12-20 MED ORDER — TIOTROPIUM BROMIDE-OLODATEROL 2.5-2.5 MCG/ACT IN AERS: 2.0000 | INHALATION_SPRAY | Freq: Every day | RESPIRATORY_TRACT | Status: DC

## 2014-12-20 NOTE — Telephone Encounter (Signed)
PA initiated thru [facility] Key: [id number] Sent for review

## 2014-12-22 NOTE — Telephone Encounter (Signed)
Checked on PA and this is still in process currently.

## 2014-12-27 NOTE — Telephone Encounter (Signed)
PA for Stiolto was denied.  RB please advise on an alternative med for this patient.  Thanks!

## 2014-12-28 ENCOUNTER — Encounter: Payer: Self-pay | Admitting: Emergency Medicine

## 2014-12-28 NOTE — Telephone Encounter (Signed)
Need info on her formulary if she can provide, help guide [place]

## 2015-01-04 ENCOUNTER — Encounter: Payer: Self-pay | Admitting: Family Medicine

## 2015-01-04 ENCOUNTER — Telehealth: Payer: Self-pay | Admitting: Family Medicine

## 2015-01-04 DIAGNOSIS — M5440 Lumbago with sciatica, unspecified side: Secondary | ICD-10-CM

## 2015-01-04 NOTE — Telephone Encounter (Signed)
Pt is requesting refill on Flexeril.  Last OV: 10/10/2014 Last Fill: 08/29/2014 #90 and 0RF  Please advise .

## 2015-01-04 NOTE — Telephone Encounter (Signed)
Caller name: Tamura  Relationship to patient: Self   Pt says that she had her flu shot with Dr. Lamonte Sakai (Pulmonary) on 11/14/14.

## 2015-01-05 MED ORDER — PROVENTIL HFA 108 (90 BASE) MCG/ACT IN AERS: 2.0000 | INHALATION_SPRAY | Freq: Four times a day (QID) | RESPIRATORY_TRACT | Status: DC | PRN

## 2015-01-05 MED ORDER — CYCLOBENZAPRINE HCL 5 MG PO TABS: 5.0000 mg | ORAL_TABLET | Freq: Three times a day (TID) | ORAL | Status: DC | PRN

## 2015-01-05 NOTE — Telephone Encounter (Signed)
Ok to refill ventolin

## 2015-01-24 ENCOUNTER — Encounter: Payer: Self-pay | Admitting: Emergency Medicine

## 2015-01-24 ENCOUNTER — Ambulatory Visit (INDEPENDENT_AMBULATORY_CARE_PROVIDER_SITE_OTHER): Payer: 59 | Admitting: Emergency Medicine

## 2015-01-24 VITALS — BP 132/80 | HR 71 | Wt 204.0 lb

## 2015-01-24 DIAGNOSIS — J309 Allergic rhinitis, unspecified: Secondary | ICD-10-CM | POA: Diagnosis not present

## 2015-01-24 DIAGNOSIS — J449 Chronic obstructive pulmonary disease, unspecified: Secondary | ICD-10-CM

## 2015-01-24 HISTORY — DX: Allergic rhinitis, unspecified: J30.9

## 2015-01-24 LAB — PULMONARY FUNCTION TEST
DL/VA % pred: 113 %
DL/VA: 5.46 ml/min/mmHg/L
DLCO UNC % PRED: 83 %
DLCO unc: 20.18 ml/min/mmHg
FEF 25-75 Post: 2.23 L/sec
FEF 25-75 Pre: 0.81 L/sec
FEF2575-%CHANGE-POST: 174 %
FEF2575-%PRED-POST: 101 %
FEF2575-%Pred-Pre: 37 %
FEV1-%CHANGE-POST: 35 %
FEV1-%PRED-PRE: 43 %
FEV1-%Pred-Post: 58 %
FEV1-POST: 1.45 L
FEV1-Pre: 1.07 L
FEV1FVC-%CHANGE-POST: 1 %
FEV1FVC-%PRED-PRE: 98 %
FEV6-%Change-Post: 34 %
FEV6-%Pred-Post: 61 %
FEV6-%Pred-Pre: 45 %
FEV6-Post: 1.88 L
FEV6-Pre: 1.4 L
FEV6FVC-%Pred-Post: 104 %
FEV6FVC-%Pred-Pre: 104 %
FVC-%CHANGE-POST: 33 %
FVC-%PRED-POST: 58 %
FVC-%PRED-PRE: 44 %
FVC-POST: 1.88 L
FVC-PRE: 1.41 L
POST FEV1/FVC RATIO: 77 %
PRE FEV6/FVC RATIO: 100 %
Post FEV6/FVC ratio: 100 %
Pre FEV1/FVC ratio: 76 %
RV % PRED: 104 %
RV: 2.16 L
TLC % pred: 82 %
TLC: 4.15 L

## 2015-01-24 NOTE — Assessment & Plan Note (Signed)
Potential source of her chronic cough. Asked her to start zyrtec or loratadine.

## 2015-01-24 NOTE — Assessment & Plan Note (Signed)
Definitely has obstruction with reversibility - benefited from [employer]. Will continue this, use albuterol prn.

## 2015-01-24 NOTE — Patient Instructions (Addendum)
Please try using delsym as needed for your cough.  Start using OTC Claritin or Zyrtec to see if this helps drainage and cough Please continue your Stiolto daily Take albuterol 2 puffs up to every 4 hours if needed for shortness of breath.  Follow with Dr Lamonte Sakai in 4 months or sooner if you have any problems.

## 2015-01-24 NOTE — Progress Notes (Signed)
PFT done today. 

## 2015-01-24 NOTE — Progress Notes (Signed)
Subjective:    Patient ID: Jasmine Mclaughlin, female    DOB: 01-Jul-1950, 65 y.o.   MRN: [id number]  HPI 65 year old woman, former smoker (25 pack years), with a history of osteoarthritis and reported asthma. She received this diagnosis in 10/2013 in the setting of wheeze, cough, dyspnea. She was started on QVAR and albuterol prn. She has subsequently also been given duoneb, uses 3-4x a week. Albuterol does help her wheeze, duonebs can help as well. Neither help as much as they used to do. She has progressed - more longstanding dyspnea, more frequent episodes of cough and wheeze.   In retrospect she has had similar episodes over the last 4-5 years   ROV 01/24/15 -- follow-up visit for suspected COPD with possible asthmatic component. I saw her in November and we started her on Stiolto and she believes that she benefited significantly.  Less "weight on her chest".  Feels that her breathing is better.  She has had a few episodes when air gets warm - causes her to be more SOB.  Sh has not tried albuterol on these occasions She underwent full pulmonary function testing today and I have reviewed the results personally. This showed mixed obstruction and restriction with a severely decreased FEV1 of 1.07 L (43% predicted). She had a vigorous bronchodilator response of 35%. Her lung volumes and diffusion capacity were both normal.   She has nasal gtt but no GERD.  Review of Systems  Constitutional: Negative for fever, appetite change and unexpected weight change.  HENT: Negative for congestion, dental problem, ear pain, nosebleeds, postnasal drip,  rhinorrhea, sinus pressure, sneezing, sore throat and trouble swallowing.   Eyes: Negative for redness and itching.  Respiratory: Positive for cough and shortness of breath. Negative for chest tightness and wheezing.   Cardiovascular: Negative for palpitations and leg swelling.  Gastrointestinal: Negative for nausea and vomiting.  Genitourinary: Negative for dysuria.  Musculoskeletal: Negative for joint swelling.  Skin: Negative for rash.  Neurological: Negative for headaches.  Hematological: Does not bruise/bleed easily.  Psychiatric/Behavioral: Negative for dysphoric mood. The patient is not nervous/anxious.         Objective:   Physical Exam Filed Vitals:   01/24/15 1216  BP: 132/80  Pulse: 71  Weight: 204 lb (92.534 kg)  SpO2: 97%   Gen: Pleasant, well-nourished, in no distress,  normal affect  ENT: No lesions,  mouth clear,  oropharynx clear, no postnasal drip  Neck: No JVD, no TMG, no carotid bruits  Lungs: No use of accessory muscles, clear without rales or rhonchi  Cardiovascular: RRR, heart sounds normal, no murmur or gallops, no peripheral edema  Musculoskeletal: No deformities, no cyanosis or clubbing  Neuro: alert, non focal  Skin: Warm, no lesions or rashes     Assessment & Plan:  COPD (chronic obstructive pulmonary disease) (HCC) Definitely has obstruction with reversibility - benefited from [employer]. Will continue this, use albuterol prn.   Allergic rhinitis Potential source of her chronic cough. Asked her to start zyrtec or loratadine.

## 2015-01-31 ENCOUNTER — Institutional Professional Consult (permissible substitution): Payer: 59 | Admitting: Internal Medicine

## 2015-02-08 ENCOUNTER — Ambulatory Visit (INDEPENDENT_AMBULATORY_CARE_PROVIDER_SITE_OTHER): Payer: 59 | Admitting: Internal Medicine

## 2015-02-08 ENCOUNTER — Encounter: Payer: Self-pay | Admitting: Internal Medicine

## 2015-02-08 VITALS — BP 108/66 | HR 68 | Temp 98.5°F | Resp 16 | Ht 64.5 in | Wt 201.5 lb

## 2015-02-08 DIAGNOSIS — K529 Noninfective gastroenteritis and colitis, unspecified: Secondary | ICD-10-CM | POA: Diagnosis not present

## 2015-02-08 MED ORDER — ONDANSETRON HCL 4 MG PO TABS: 4.0000 mg | ORAL_TABLET | Freq: Three times a day (TID) | ORAL | Status: DC | PRN

## 2015-02-08 MED FILL — ONDANSETRON HCL 4 MG TABLET: 4 | 6 days supply | Qty: 20 | Fill #0

## 2015-02-08 NOTE — Patient Instructions (Signed)
Rest  Drink plenty of fluids  Follow a bland diet: Soup, rice, angel hair. Gradually go back to a regular diet  Hold Celebrex until you feel better  Call anytime is severe symptoms, fever, chills, blood in the stools or if you are not gradually better in the next 10 days

## 2015-02-08 NOTE — Progress Notes (Signed)
Subjective:    Patient ID: Jasmine Mclaughlin, female    DOB: [birth year], 65 y.o.   MRN: [id number]  DOS:  02/08/2015 Type of visit - description : Acute visit Interval history: sx started 4 days ago with a ill-defined abdominal discomfort, 3 days ago developed nausea, several episodes of vomiting and diarrhea. Currently better, still has some persistent nausea. The stools are still loose, and usually has a episode immediately after she tries to eat solids. She is drinking fluids. Her granddaughter family had similar symptoms 2 weeks ago. No recent trips outside the [place]. Has not eaten anything unusual  Review of Systems Denies fever chills Mild lower abdominal discomfort. No blood in the stools or hematemesis  Past Medical History  Diagnosis Date  . Jasmine polyps   . Genital warts   . Asthma   . Shortness of breath     pt./ reports that she is out of shape  . Arthritis     degenerative back, joint disturbance "everywhere "    Past Surgical History  Procedure Laterality Date  . Knee arthroscopy       R knee 01/2012, for ligament tears  . Tonsillectomy    . Vaginal delivery      x1  . Anterior fusion lumbar spine  05/06/2012  . Anterior lat lumbar fusion N/A 05/06/2012    Procedure: ANTERIOR LATERAL LUMBAR FUSION 3 LEVELS;  Surgeon: Sinclair Ship, MD;  Location: [facility];  Service: Orthopedics;  Laterality: N/A;  Lateral interbody fusion, lumbar 2-3,lumbar 3-4, lumbar 4-5 with instrumentation and allograft.    Social History   Social History  . Marital Status: Single    Spouse Name: N/A  . Number of Children: 1  . Years of Education: N/A   Occupational History  . [employer]     sits in front of computer   Social History Main Topics  . Smoking status: Former Smoker -- 0.50 packs/day for 45 years    Types: Cigarettes    Quit date: 10/10/2014  . Smokeless tobacco: Never Used     Comment: she is thinking about it   . Alcohol Use: 0.0 oz/week    0 Standard drinks or  equivalent per week     Comment: 2 times per month  . Drug Use: No  . Sexual Activity:    Partners: Male   Other Topics Concern  . Not on file   Social History Narrative   1 caffeine drinks daily         Medication List       This list is accurate as of: 02/08/15  3:13 PM.  Always use your most recent med list.               acetaminophen 650 MG CR tablet  Commonly known as:  TYLENOL  Take 650 mg by mouth every 8 (eight) hours as needed for pain. Reported on 02/08/2015     CALCIUM 600 PO  Take 1,800 mg by mouth daily. Reported on 02/08/2015     CELEBREX 200 MG capsule  Generic drug:  celecoxib  Take 1 capsule (200 mg total) by mouth 2 (two) times daily.     citalopram 20 MG tablet  Commonly known as:  CELEXA  Take 1 tablet (20 mg total) by mouth daily.     cyclobenzaprine 5 MG tablet  Commonly known as:  FLEXERIL  Take 1 tablet (5 mg total) by mouth 3 (three) times daily as needed for muscle spasms.  diphenhydrAMINE 50 MG capsule  Commonly known as:  BENADRYL  Take 50 mg by mouth at bedtime as needed for sleep. Reported on 02/08/2015     docusate sodium 100 MG capsule  Commonly known as:  COLACE  Take 100 mg by mouth 3 (three) times daily as needed for constipation. Reported on 02/08/2015     ondansetron 4 MG tablet  Commonly known as:  ZOFRAN  Take 1 tablet (4 mg total) by mouth every 8 (eight) hours as needed for nausea or vomiting.     PROVENTIL HFA 108 (90 Base) MCG/ACT inhaler  Generic drug:  albuterol  Inhale 2 puffs into the lungs every 6 (six) hours as needed.     Tiotropium Bromide-Olodaterol 2.5-2.5 MCG/ACT Aers  Commonly known as:  STIOLTO RESPIMAT  Inhale 2 puffs into the lungs daily.     VITAMIN D (CHOLECALCIFEROL) PO  Take 1,200 mg by mouth daily. Reported on 02/08/2015           Objective:   Physical Exam BP 108/66 mmHg  Pulse 68  Temp(Src) 98.5 F (36.9 C) (Oral)  Resp 16  Ht 5' 4.5" (1.638 m)  Wt 201 lb 8 oz (91.4 kg)  BMI 34.07  kg/m2 General:   Well developed, well nourished . NAD.  HEENT:  Normocephalic . Face symmetric, atraumatic. Oral membranes not dry Lungs:  CTA B Normal respiratory effort, no intercostal retractions, no accessory muscle use. Heart: RRR,  no murmur.  no pretibial edema bilaterally  Abdomen:  Not distended, soft, minimal tenderness at the lower abdomen bilaterally without mass or rebound Skin: Not pale. Not jaundice Neurologic:  alert & oriented X3.  Speech normal, gait appropriate for age and unassisted Psych--  Cognition and judgment appear intact.  Cooperative with normal attention span and concentration.  Behavior appropriate. No anxious or depressed appearing.    Assessment & Plan:   Acute gastroenteritis: Started 4 days ago, slightly better today, otherpeople in the community affect w/ similar sx >> likely viral gastroenteritis, recommend rest, drink fluids, bland diet, hold Celebrex for few days. She verbalized understanding. Will call if not improving.

## 2015-02-08 NOTE — Progress Notes (Signed)
Pre visit review using our clinic review tool, if applicable. No additional management support is needed unless otherwise documented below in the visit note. 

## 2015-02-10 ENCOUNTER — Encounter: Payer: Self-pay | Admitting: Internal Medicine

## 2015-02-10 NOTE — Telephone Encounter (Signed)
Work note extended through February 13, 2015.

## 2015-03-03 ENCOUNTER — Encounter: Payer: Self-pay | Admitting: Medical

## 2015-03-03 ENCOUNTER — Ambulatory Visit (INDEPENDENT_AMBULATORY_CARE_PROVIDER_SITE_OTHER): Payer: 59 | Admitting: Medical

## 2015-03-03 VITALS — BP 104/68 | HR 85 | Temp 98.6°F | Ht 64.5 in | Wt 198.0 lb

## 2015-03-03 DIAGNOSIS — R062 Wheezing: Secondary | ICD-10-CM

## 2015-03-03 DIAGNOSIS — J449 Chronic obstructive pulmonary disease, unspecified: Secondary | ICD-10-CM | POA: Diagnosis not present

## 2015-03-03 MED ORDER — BENZONATATE 100 MG PO CAPS: 100.0000 mg | ORAL_CAPSULE | Freq: Three times a day (TID) | ORAL | Status: DC | PRN

## 2015-03-03 MED ORDER — PREDNISONE 20 MG PO TABS: ORAL_TABLET | ORAL | Status: DC

## 2015-03-03 NOTE — Patient Instructions (Addendum)
For copd flare/wheezing. Continue albuterol and stiolto respimat. Will add taper dose of prednisone.  For cough benzonatate.  If you get fever, productive cough or other infectious symptoms let us know.  Follow up in 5 days any persisting symptoms or as needed

## 2015-03-03 NOTE — Progress Notes (Signed)
Pre visit review using our clinic review tool, if applicable. No additional management support is needed unless otherwise documented below in the visit note. 

## 2015-03-03 NOTE — Progress Notes (Signed)
Subjective:    Patient ID: Jasmine Mclaughlin, female    DOB: 09-Apr-1950, 65 y.o.   MRN: [id number]  HPI  3 days of wheezing and dry cough. Was doing fine before recent flare. Pt has albuterol inhaler available if needed.   Pt uses albuterol about one time a month. Sometimes will just need one day. Other times will use  3-4 days in a row.  Pt had recent runny nose that preceded. No sneezing. No pnd.  Pt used to smoke.  Pt states growing up did not have asthma. Pt states breathing problems started 2 years ago. No fever, no chills, no sweats.   No diffuse flu like achiness.    Review of Systems  Constitutional: Negative for fever, chills and fatigue.  HENT: Negative for congestion, ear pain and sinus pressure.   Respiratory: Positive for cough, shortness of breath and wheezing.   Cardiovascular: Negative for chest pain, palpitations and leg swelling.  Gastrointestinal: Positive for abdominal pain.  Musculoskeletal:       No leg pain.  Neurological: Negative for dizziness and headaches.  Hematological: Negative.  Does not bruise/bleed easily.  Psychiatric/Behavioral: Negative for behavioral problems and confusion.   Past Medical History  Diagnosis Date  . Jasmine polyps   . Genital warts   . Asthma   . Shortness of breath     pt./ reports that she is out of shape  . Arthritis     degenerative back, joint disturbance "everywhere "    Social History   Social History  . Marital Status: Single    Spouse Name: N/A  . Number of Children: 1  . Years of Education: N/A   Occupational History  . [employer]     sits in front of computer   Social History Main Topics  . Smoking status: Former Smoker -- 0.50 packs/day for 45 years    Types: Cigarettes    Quit date: 10/10/2014  . Smokeless tobacco: Never Used     Comment: she is thinking about it   . Alcohol Use: 0.0 oz/week    0 Standard drinks or equivalent per week     Comment: 2 times per month  . Drug Use: No  . Sexual  Activity:    Partners: Male   Other Topics Concern  . Not on file   Social History Narrative   1 caffeine drinks daily     Past Surgical History  Procedure Laterality Date  . Knee arthroscopy       R knee 01/2012, for ligament tears  . Tonsillectomy    . Vaginal delivery      x1  . Anterior fusion lumbar spine  05/06/2012  . Anterior lat lumbar fusion N/A 05/06/2012    Procedure: ANTERIOR LATERAL LUMBAR FUSION 3 LEVELS;  Surgeon: Sinclair Ship, MD;  Location: [facility];  Service: Orthopedics;  Laterality: N/A;  Lateral interbody fusion, lumbar 2-3,lumbar 3-4, lumbar 4-5 with instrumentation and allograft.    Family History  Problem Relation Age of Onset  . Arthritis Mother   . Arthritis Father   . Stroke Father   . Jasmine cancer Father 40  . Heart disease Father 5    chf  . Breast cancer Maternal Grandmother   . Asthma Maternal Grandmother   . Asthma Daughter     No Known Allergies  Current Outpatient Prescriptions on File Prior to Visit  Medication Sig Dispense Refill  . acetaminophen (TYLENOL) 650 MG CR tablet Take 650 mg by  mouth every 8 (eight) hours as needed for pain. Reported on 02/08/2015    . Calcium Carbonate (CALCIUM 600 PO) Take 1,800 mg by mouth daily. Reported on 02/08/2015    . CELEBREX 200 MG capsule Take 1 capsule (200 mg total) by mouth 2 (two) times daily. 180 capsule 3  . citalopram (CELEXA) 20 MG tablet Take 1 tablet (20 mg total) by mouth daily. 90 tablet 3  . cyclobenzaprine (FLEXERIL) 5 MG tablet Take 1 tablet (5 mg total) by mouth 3 (three) times daily as needed for muscle spasms. 90 tablet 0  . diphenhydrAMINE (BENADRYL) 50 MG capsule Take 50 mg by mouth at bedtime as needed for sleep. Reported on 02/08/2015    . docusate sodium (COLACE) 100 MG capsule Take 100 mg by mouth 3 (three) times daily as needed for constipation. Reported on 02/08/2015    . PROVENTIL HFA 108 (90 Base) MCG/ACT inhaler Inhale 2 puffs into the lungs every 6 (six) hours as needed.  1 Inhaler 1  . Tiotropium Bromide-Olodaterol (STIOLTO RESPIMAT) 2.5-2.5 MCG/ACT AERS Inhale 2 puffs into the lungs daily. 1 Inhaler 5  . VITAMIN D, CHOLECALCIFEROL, PO Take 1,200 mg by mouth daily. Reported on 02/08/2015     No current facility-administered medications on file prior to visit.    BP 104/68 mmHg  Pulse 85  Temp(Src) 98.6 F (37 C) (Oral)  Ht 5' 4.5" (1.638 m)  Wt 198 lb (89.812 kg)  BMI 33.47 kg/m2  SpO2 95%       Objective:   Physical Exam  General  Mental Status - Alert. General Appearance - Well groomed. Not in acute distress.  Skin Rashes- No Rashes.  HEENT Head- Normal. Ear Auditory Canal - Left- Normal. Right - Normal.Tympanic Membrane- Left- Normal. Right- Normal. Eye Sclera/Conjunctiva- Left- Normal. Right- Normal. Nose & Sinuses Nasal Mucosa- Left-  Boggy and Congested. Right-  Boggy and  Congested.Bilateral no  maxillary and  No frontal sinus pressure. Mouth & Throat Lips: Upper Lip- Normal: no dryness, cracking, pallor, cyanosis, or vesicular eruption. Lower Lip-Normal: no dryness, cracking, pallor, cyanosis or vesicular eruption. Buccal Mucosa- Bilateral- No Aphthous ulcers. Oropharynx- No Discharge or Erythema. Tonsils: Characteristics- Bilateral- No Erythema or Congestion. Size/Enlargement- Bilateral- No enlargement. Discharge- bilateral-None.  Neck Neck- Supple. No Masses.   Chest and Lung Exam Auscultation: Breath Sounds:-even, shallow but faint mild appearance of slight labored breathing  Cardiovascular Auscultation:Rythm- Regular, rate and rhythm. Murmurs & Other Heart Sounds:Ausculatation of the heart reveal- No Murmurs.  Lymphatic Head & Neck General Head & Neck Lymphatics: Bilateral: Description- No Localized lymphadenopathy.       Assessment & Plan:  For copd flare/wheezing. Continue albuterol and stiolto respimat. Will add taper dose of prednisone.  For cough benzonatate.  If you get fever, productive cough or other  infectious symptoms let us know.  Follow up in 5 days any persisting symptoms or as needed

## 2015-03-06 ENCOUNTER — Encounter: Payer: Self-pay | Admitting: Medical

## 2015-03-06 NOTE — Telephone Encounter (Signed)
I am printing a work note out for pt. She can pick it up if we can't attach it to my chart?

## 2015-03-06 NOTE — Telephone Encounter (Signed)
Do you or anyone else know how to e-mail work excuse note? Pt states she can't pick it up?

## 2015-03-07 NOTE — Telephone Encounter (Signed)
Did pt pick up her work note? Yesterday?

## 2015-04-06 ENCOUNTER — Other Ambulatory Visit: Payer: Self-pay | Admitting: Family Medicine

## 2015-04-06 DIAGNOSIS — M5440 Lumbago with sciatica, unspecified side: Secondary | ICD-10-CM

## 2015-04-06 DIAGNOSIS — M545 Low back pain: Secondary | ICD-10-CM

## 2015-04-06 DIAGNOSIS — F439 Reaction to severe stress, unspecified: Secondary | ICD-10-CM

## 2015-04-06 MED ORDER — CYCLOBENZAPRINE HCL 5 MG PO TABS: 5.0000 mg | ORAL_TABLET | Freq: Three times a day (TID) | ORAL | Status: DC | PRN

## 2015-04-06 MED ORDER — CITALOPRAM HYDROBROMIDE 20 MG PO TABS: 20.0000 mg | ORAL_TABLET | Freq: Every day | ORAL | Status: DC

## 2015-04-06 MED ORDER — CELEBREX 200 MG PO CAPS: 200.0000 mg | ORAL_CAPSULE | Freq: Two times a day (BID) | ORAL | Status: DC

## 2015-04-06 NOTE — Telephone Encounter (Signed)
Last seen 10/10/14 and filled 01/05/15 #90  Please advise    KP

## 2015-04-11 ENCOUNTER — Encounter: Payer: Self-pay | Admitting: Family Medicine

## 2015-04-11 MED ORDER — PROVENTIL HFA 108 (90 BASE) MCG/ACT IN AERS: 2.0000 | INHALATION_SPRAY | Freq: Four times a day (QID) | RESPIRATORY_TRACT | Status: DC | PRN

## 2015-04-14 ENCOUNTER — Other Ambulatory Visit: Payer: Self-pay

## 2015-04-14 MED ORDER — ALBUTEROL SULFATE HFA 108 (90 BASE) MCG/ACT IN AERS: 2.0000 | INHALATION_SPRAY | Freq: Four times a day (QID) | RESPIRATORY_TRACT | Status: DC | PRN

## 2015-04-18 ENCOUNTER — Encounter: Payer: Self-pay | Admitting: Family Medicine

## 2015-04-18 NOTE — Telephone Encounter (Signed)
Attempted to contact patient via telephone to reschedule appointment scheduled for 4/28. Left voicemail for patient to call and reschedule. Sent My Chart message. Appointment cancelled and letter mailed 04/18/2015

## 2015-04-24 ENCOUNTER — Ambulatory Visit: Payer: 59 | Admitting: Emergency Medicine

## 2015-04-27 ENCOUNTER — Encounter: Payer: Self-pay | Admitting: Emergency Medicine

## 2015-04-27 MED ORDER — TIOTROPIUM BROMIDE-OLODATEROL 2.5-2.5 MCG/ACT IN AERS: 2.0000 | INHALATION_SPRAY | Freq: Every day | RESPIRATORY_TRACT | Status: DC

## 2015-05-05 ENCOUNTER — Encounter: Payer: 59 | Admitting: Family Medicine

## 2015-05-24 ENCOUNTER — Ambulatory Visit: Payer: 59 | Admitting: Emergency Medicine

## 2015-08-15 ENCOUNTER — Encounter: Payer: Self-pay | Admitting: Internal Medicine

## 2016-02-28 ENCOUNTER — Encounter: Payer: Self-pay | Admitting: Family Medicine

## 2016-02-28 NOTE — Telephone Encounter (Signed)
She can go to [email or web address] and see if she can get it from the company Otherwise---- otc aleve or tylenol arthritis

## 2016-06-10 ENCOUNTER — Telehealth: Payer: Self-pay

## 2016-06-11 ENCOUNTER — Encounter: Payer: Self-pay | Admitting: Family Medicine

## 2016-06-11 ENCOUNTER — Ambulatory Visit (INDEPENDENT_AMBULATORY_CARE_PROVIDER_SITE_OTHER): Payer: Medicare Other | Admitting: Family Medicine

## 2016-06-11 VITALS — BP 136/66 | HR 66 | Temp 98.6°F | Resp 16 | Ht 64.0 in | Wt 192.0 lb

## 2016-06-11 DIAGNOSIS — Z136 Encounter for screening for cardiovascular disorders: Secondary | ICD-10-CM | POA: Diagnosis not present

## 2016-06-11 DIAGNOSIS — Z1231 Encounter for screening mammogram for malignant neoplasm of breast: Secondary | ICD-10-CM | POA: Diagnosis not present

## 2016-06-11 DIAGNOSIS — J449 Chronic obstructive pulmonary disease, unspecified: Secondary | ICD-10-CM | POA: Diagnosis not present

## 2016-06-11 DIAGNOSIS — E2839 Other primary ovarian failure: Secondary | ICD-10-CM | POA: Diagnosis not present

## 2016-06-11 DIAGNOSIS — R2231 Localized swelling, mass and lump, right upper limb: Secondary | ICD-10-CM

## 2016-06-11 DIAGNOSIS — Z1159 Encounter for screening for other viral diseases: Secondary | ICD-10-CM | POA: Diagnosis not present

## 2016-06-11 DIAGNOSIS — Z0000 Encounter for general adult medical examination without abnormal findings: Secondary | ICD-10-CM | POA: Diagnosis not present

## 2016-06-11 LAB — CBC WITH DIFFERENTIAL/PLATELET
BASOS ABS: 0.1 10*3/uL (ref 0.0–0.1)
BASOS PCT: 0.8 % (ref 0.0–3.0)
EOS ABS: 0.3 10*3/uL (ref 0.0–0.7)
Eosinophils Relative: 3.1 % (ref 0.0–5.0)
HCT: 43.2 % (ref 36.0–46.0)
Hemoglobin: 14.3 g/dL (ref 12.0–15.0)
LYMPHS ABS: 3 10*3/uL (ref 0.7–4.0)
Lymphocytes Relative: 32.7 % (ref 12.0–46.0)
MCHC: 33.1 g/dL (ref 30.0–36.0)
MCV: 94.5 fl (ref 78.0–100.0)
Monocytes Absolute: 0.7 10*3/uL (ref 0.1–1.0)
Monocytes Relative: 7.2 % (ref 3.0–12.0)
NEUTROS ABS: 5.1 10*3/uL (ref 1.4–7.7)
NEUTROS PCT: 56.2 % (ref 43.0–77.0)
PLATELETS: 366 10*3/uL (ref 150.0–400.0)
RBC: 4.57 Mil/uL (ref 3.87–5.11)
RDW: 14.3 % (ref 11.5–15.5)
WBC: 9.1 10*3/uL (ref 4.0–10.5)

## 2016-06-11 LAB — COMPREHENSIVE METABOLIC PANEL
ALT: 11 U/L (ref 0–35)
AST: 13 U/L (ref 0–37)
Albumin: 4.3 g/dL (ref 3.5–5.2)
Alkaline Phosphatase: 94 U/L (ref 39–117)
BILIRUBIN TOTAL: 0.4 mg/dL (ref 0.2–1.2)
BUN: 13 mg/dL (ref 6–23)
CHLORIDE: 105 meq/L (ref 96–112)
CO2: 30 meq/L (ref 19–32)
Calcium: 10.1 mg/dL (ref 8.4–10.5)
Creatinine, Ser: 0.69 mg/dL (ref 0.40–1.20)
GFR: 90.75 mL/min (ref >60.00)
GLUCOSE: 95 mg/dL (ref 70–99)
Potassium: 4.6 mEq/L (ref 3.5–5.1)
Sodium: 140 mEq/L (ref 135–145)
Total Protein: 7.3 g/dL (ref 6.0–8.3)

## 2016-06-11 LAB — LIPID PANEL
CHOL/HDL RATIO: 4
Cholesterol: 161 mg/dL (ref 0–200)
HDL: 41.8 mg/dL (ref >39.00)
LDL CALC: 95 mg/dL (ref 0–99)
NONHDL: 118.97
TRIGLYCERIDES: 119 mg/dL (ref 0.0–149.0)
VLDL: 23.8 mg/dL (ref 0.0–40.0)

## 2016-06-11 LAB — HEPATITIS C ANTIBODY: HCV Ab: NEGATIVE

## 2016-06-11 NOTE — Progress Notes (Signed)
Subjective:    Jasmine Mclaughlin is a 66 y.o. female who presents for a Welcome to Medicare exam. Pt c/o breathing problems.  She has not been able to go to pulmonary because she just got medicare.  She has no rx plan yet but will get it with open enrollment. She is also c/o L abd pain along side upper and lower.  No constipation, NVD somedays are worse than others.  She is also c/o numbness in toes and does have a hx of back surgery.   No new injury.   Pt also c/o lump under L arm -- it has been and infected cyst in the past.      Review of Systems  Review of Systems  Constitutional: Negative for activity change, appetite change and fatigue.  HENT: Negative for hearing loss, congestion, tinnitus and ear discharge.   Eyes: Negative for visual disturbance (see optho q1y -- vision corrected to 20/20 with glasses).  Respiratory: Negative for cough, chest tightness and shortness of breath.   Cardiovascular: Negative for chest pain, palpitations and leg swelling.  Gastrointestinal: Negative for abdominal pain, diarrhea, constipation and abdominal distention.  Genitourinary: Negative for urgency, frequency, decreased urine volume and difficulty urinating.  Musculoskeletal: Negative for back pain, arthralgias and gait problem.  Skin: Negative for color change, pallor and rash.  Neurological: Negative for dizziness, light-headedness, numbness and headaches.  Hematological: Negative for adenopathy. Does not bruise/bleed easily.  Psychiatric/Behavioral: Negative for suicidal ideas, confusion, sleep disturbance, self-injury, dysphoric mood, decreased concentration and agitation.  Pt is able to read and write and can do all ADLs No risk for falling No abuse/ violence in home          Objective:    Today's Vitals   06/11/16 1024  BP: 136/66  Pulse: 66  Resp: 16  Temp: 98.6 F (37 C)  TempSrc: Oral  SpO2: 94%  Weight: 192 lb (87.1 kg)  Height: 5\' 4"  (1.626 m)  Body mass index is 32.96  kg/m.  Medications Outpatient Encounter Prescriptions as of 06/11/2016  Medication Sig  . acetaminophen (TYLENOL) 650 MG CR tablet Take 650 mg by mouth every 8 (eight) hours as needed for pain. Reported on 02/08/2015  . albuterol (PROVENTIL HFA;VENTOLIN HFA) 108 (90 Base) MCG/ACT inhaler Inhale 2 puffs into the lungs every 6 (six) hours as needed for wheezing or shortness of breath.  . [DISCONTINUED] benzonatate (TESSALON) 100 MG capsule Take 1 capsule (100 mg total) by mouth 3 (three) times daily as needed. (Patient not taking: Reported on 06/10/2016)   No facility-administered encounter medications on file as of 06/11/2016.      History: Past Medical History:  Diagnosis Date  . Arthritis    degenerative back, joint disturbance "everywhere "  . Asthma   . Colon polyps   . Genital warts   . Shortness of breath    pt./ reports that she is out of shape   Past Surgical History:  Procedure Laterality Date  . ANTERIOR FUSION LUMBAR SPINE  05/06/2012  . ANTERIOR LAT LUMBAR FUSION N/A 05/06/2012   Procedure: ANTERIOR LATERAL LUMBAR FUSION 3 LEVELS;  Surgeon: Sinclair Ship, MD;  Location: [facility];  Service: Orthopedics;  Laterality: N/A;  Lateral interbody fusion, lumbar 2-3,lumbar 3-4, lumbar 4-5 with instrumentation and allograft.  Marland Kitchen KNEE ARTHROSCOPY      R knee 01/2012, for ligament tears  . TONSILLECTOMY    . VAGINAL DELIVERY     x1    Family History  Problem Relation Age of Onset  . Arthritis Mother   . Arthritis Father   . Stroke Father   . Colon cancer Father 53  . Heart disease Father 29       chf  . Breast cancer Maternal Grandmother   . Asthma Maternal Grandmother   . Asthma Daughter    Social History   Occupational History  . [employer]     sits in front of computer/part time   Social History Main Topics  . Smoking status: Former Smoker    Packs/day: 0.50    Years: 45.00    Types: Cigarettes    Quit date: 10/10/2014  . Smokeless tobacco: Never Used     Comment: she  has been letting other smoke in her house-- she will put a stop to this  . Alcohol use 0.0 oz/week     Comment: 2 times per month  . Drug use: No  . Sexual activity: Yes    Partners: Male    Tobacco Counseling Counseling given: Yes   Immunizations and Health Maintenance Immunization History  Administered Date(s) Administered  . Influenza-Unspecified 11/14/2014  . Pneumococcal Polysaccharide-23 02/15/2014  . Tdap 08/29/2010  . Zoster 10/14/2012   Health Maintenance Due  Topic Date Due  . MAMMOGRAM  10/09/2014  . COLONOSCOPY  10/25/2015  . PNA vac Low Risk Adult (1 of 2 - PCV13) 06/04/2016    Activities of Daily Living In your present state of health, do you have any difficulty performing the following activities: 06/11/2016  Hearing? N  Vision? Y  Difficulty concentrating or making decisions? N  Walking or climbing stairs? Y  Dressing or bathing? N  Doing errands, shopping? N  Some recent data might be hidden    Physical Exam   BP 136/66 (BP Location: Left Arm, Cuff Size: Normal)   Pulse 66   Temp 98.6 F (37 C) (Oral)   Resp 16   Ht 5\' 4"  (1.626 m)   Wt 192 lb (87.1 kg)   SpO2 94%   BMI 32.96 kg/m  General appearance: alert, cooperative, appears stated age and no distress Head: Normocephalic, without obvious abnormality, atraumatic Eyes: conjunctivae/corneas clear. PERRL, EOM's intact. Fundi benign. Ears: normal TM's and external ear canals both ears Nose: Nares normal. Septum midline. Mucosa normal. No drainage or sinus tenderness. Throat: lips, mucosa, and tongue normal; teeth and gums normal Neck: no adenopathy, no carotid bruit, no JVD, supple, symmetrical, trachea midline and thyroid not enlarged, symmetric, no tenderness/mass/nodules Back: symmetric, no curvature. ROM normal. No CVA tenderness. Lungs: clear to auscultation bilaterally Breasts: normal appearance, no masses or tenderness Heart: regular rate and rhythm, S1, S2 normal, no murmur, click, rub  or gallop Abdomen: soft, non-tender; bowel sounds normal; no masses,  no organomegaly Pelvic: not indicated; post-menopausal, no abnormal Pap smears in past Extremities: extremities normal, atraumatic, no cyanosis or edema Pulses: 2+ and symmetric Skin: Skin color, texture, turgor normal. No rashes or lesions Lymph nodes: Cervical, supraclavicular, and axillary nodes normal. Neurologic: Alert and oriented X 3, normal strength and tone. Normal symmetric reflexes. Normal coordination and gait  Advanced Directives: Does Patient Have a Medical Advance Directive?: No Would patient like information on creating a medical advance directive?: Yes (MAU/Ambulatory/Procedural Areas - Information given)    Assessment:    This is a routine wellness examination for this patient .   Vision/Hearing screen  Visual Acuity Screening   Right eye Left eye Both eyes  Without correction:     With correction:  20/20 20/20 20/20     Dietary issues and exercise activities discussed:     Goals    None     Depression Screen PHQ 2/9 Scores 06/11/2016 10/14/2012  PHQ - 2 Score 0 0     Fall Risk Fall Risk  06/11/2016  Falls in the past year? No    Cognitive Function: MMSE - Mini Mental State Exam 06/11/2016  Orientation to time 5  Orientation to Place 5  Registration 3  Attention/ Calculation 5  Recall 3  Language- name 2 objects 2  Language- repeat 1  Language- follow 3 step command 3  Language- read & follow direction 1  Write a sentence 1  Copy design 1  Total score 30        Patient Care Team: Carollee Herter, Alferd Apa, DO as PCP - General (Family Medicine)     Plan:    see AVS Check labs   I have personally reviewed and noted the following in the patient's chart:   . Medical and social history . Use of alcohol, tobacco or illicit drugs  . Current medications and supplements . Functional ability and status . Nutritional status . Physical activity . Advanced directives . List of  other physicians . Hospitalizations, surgeries, and ER visits in previous 12 months . Vitals . Screenings to include cognitive, depression, and falls . Referrals and appointments  In addition, I have reviewed and discussed with patient certain preventive protocols, quality metrics, and best practice recommendations. A written personalized care plan for preventive services as well as general preventive health recommendations were provided to patient.    1. Encounter for hepatitis C screening test for low risk patient  - Hepatitis C antibody  2. Asthma with COPD ([place]) Stable  - POCT Urinalysis Dipstick (Automated) - CBC with Differential/Platelet - Comprehensive metabolic panel - Lipid panel - Ambulatory referral to Gastroenterology - Hepatitis C antibody  3. Screening for ischemic heart disease   - POCT Urinalysis Dipstick (Automated) - CBC with Differential/Platelet - Comprehensive metabolic panel - Lipid panel - Ambulatory referral to Gastroenterology - Hepatitis C antibody  4. Need for hepatitis C screening test   - Hepatitis C antibody  5. Estrogen deficiency   - DG Bone Density; Future  6. Lump of axilla, right   - US BREAST COMPLETE UNI RIGHT INC AXILLA; Future  7. Encounter for Medicare annual wellness exam    8. Encounter for screening mammogram for malignant neoplasm of breast   - MM DIGITAL SCREENING BILATERAL; Future  [place], DO 06/14/2016

## 2016-06-11 NOTE — Patient Instructions (Signed)
Preventive Care 65 Years and Older, Female Preventive care refers to lifestyle choices and visits with your health care provider that can promote health and wellness. What does preventive care include?  A yearly physical exam. This is also called an annual well check.  Dental exams once or twice a year.  Routine eye exams. Ask your health care provider how often you should have your eyes checked.  Personal lifestyle choices, including: ? Daily care of your teeth and gums. ? Regular physical activity. ? Eating a healthy diet. ? Avoiding tobacco and drug use. ? Limiting alcohol use. ? Practicing safe sex. ? Taking low-dose aspirin every day. ? Taking vitamin and mineral supplements as recommended by your health care provider. What happens during an annual well check? The services and screenings done by your health care provider during your annual well check will depend on your age, overall health, lifestyle risk factors, and family history of disease. Counseling Your health care provider may ask you questions about your:  Alcohol use.  Tobacco use.  Drug use.  Emotional well-being.  Home and relationship well-being.  Sexual activity.  Eating habits.  History of falls.  Memory and ability to understand (cognition).  Work and work environment.  Reproductive health.  Screening You may have the following tests or measurements:  Height, weight, and BMI.  Blood pressure.  Lipid and cholesterol levels. These may be checked every 5 years, or more frequently if you are over 50 years old.  Skin check.  Lung cancer screening. You may have this screening every year starting at age 55 if you have a 30-pack-year history of smoking and currently smoke or have quit within the past 15 years.  Fecal occult blood test (FOBT) of the stool. You may have this test every year starting at age 50.  Flexible sigmoidoscopy or colonoscopy. You may have a sigmoidoscopy every 5 years or  a colonoscopy every 10 years starting at age 50.  Hepatitis C blood test.  Hepatitis B blood test.  Sexually transmitted disease (STD) testing.  Diabetes screening. This is done by checking your blood sugar (glucose) after you have not eaten for a while (fasting). You may have this done every 1-3 years.  Bone density scan. This is done to screen for osteoporosis. You may have this done starting at age 65.  Mammogram. This may be done every 1-2 years. Talk to your health care provider about how often you should have regular mammograms.  Talk with your health care provider about your test results, treatment options, and if necessary, the need for more tests. Vaccines Your health care provider may recommend certain vaccines, such as:  Influenza vaccine. This is recommended every year.  Tetanus, diphtheria, and acellular pertussis (Tdap, Td) vaccine. You may need a Td booster every 10 years.  Varicella vaccine. You may need this if you have not been vaccinated.  Zoster vaccine. You may need this after age 60.  Measles, mumps, and rubella (MMR) vaccine. You may need at least one dose of MMR if you were born in 1957 or later. You may also need a second dose.  Pneumococcal 13-valent conjugate (PCV13) vaccine. One dose is recommended after age 65.  Pneumococcal polysaccharide (PPSV23) vaccine. One dose is recommended after age 65.  Meningococcal vaccine. You may need this if you have certain conditions.  Hepatitis A vaccine. You may need this if you have certain conditions or if you travel or work in places where you may be exposed to hepatitis   A.  Hepatitis B vaccine. You may need this if you have certain conditions or if you travel or work in places where you may be exposed to hepatitis B.  Haemophilus influenzae type b (Hib) vaccine. You may need this if you have certain conditions.  Talk to your health care provider about which screenings and vaccines you need and how often you  need them. This information is not intended to replace advice given to you by your health care provider. Make sure you discuss any questions you have with your health care provider. Document Released: 01/20/2015 Document Revised: 09/13/2015 Document Reviewed: 10/25/2014 Elsevier Interactive Patient Education  2017 [employer].

## 2016-06-12 ENCOUNTER — Telehealth: Payer: Self-pay | Admitting: Family Medicine

## 2016-06-12 NOTE — Telephone Encounter (Signed)
Pt called says her mychart told her she has appt for PPD reading 6/7 with nurse. Pt states did not have it and she canceled nurse visit appt for ppd reading. Nurse informed.

## 2016-06-14 ENCOUNTER — Encounter: Payer: Self-pay | Admitting: Family Medicine

## 2016-06-14 ENCOUNTER — Ambulatory Visit: Payer: Medicare Other

## 2016-06-17 ENCOUNTER — Encounter: Payer: Self-pay | Admitting: Family Medicine

## 2016-06-17 MED ORDER — CYCLOBENZAPRINE HCL 5 MG PO TABS: 5.0000 mg | ORAL_TABLET | Freq: Three times a day (TID) | ORAL | 1 refills | Status: DC | PRN

## 2016-06-24 ENCOUNTER — Other Ambulatory Visit: Payer: Self-pay | Admitting: Family Medicine

## 2016-06-24 DIAGNOSIS — R2231 Localized swelling, mass and lump, right upper limb: Secondary | ICD-10-CM

## 2016-07-19 ENCOUNTER — Inpatient Hospital Stay: Admission: RE | Admit: 2016-07-19 | Payer: Medicare Other | Source: Ambulatory Visit

## 2016-07-19 ENCOUNTER — Other Ambulatory Visit: Payer: Medicare Other

## 2016-07-26 ENCOUNTER — Inpatient Hospital Stay: Admission: RE | Admit: 2016-07-26 | Payer: Medicare Other | Source: Ambulatory Visit

## 2016-07-26 ENCOUNTER — Other Ambulatory Visit: Payer: Medicare Other

## 2016-08-21 ENCOUNTER — Ambulatory Visit
Admission: RE | Admit: 2016-08-21 | Discharge: 2016-08-21 | Disposition: A | Discharge status: Home / Self Care | Payer: Medicare Other | Source: Ambulatory Visit | Attending: Family Medicine | Admitting: Family Medicine

## 2016-08-21 ENCOUNTER — Ambulatory Visit: Admission: RE | Admit: 2016-08-21 | Payer: Medicare Other | Source: Ambulatory Visit

## 2016-08-21 ENCOUNTER — Other Ambulatory Visit: Payer: Self-pay | Admitting: Family Medicine

## 2016-08-21 DIAGNOSIS — E2839 Other primary ovarian failure: Secondary | ICD-10-CM

## 2016-08-21 DIAGNOSIS — Z780 Asymptomatic menopausal state: Secondary | ICD-10-CM | POA: Diagnosis not present

## 2016-08-21 DIAGNOSIS — N6320 Unspecified lump in the left breast, unspecified quadrant: Secondary | ICD-10-CM

## 2016-08-21 DIAGNOSIS — R928 Other abnormal and inconclusive findings on diagnostic imaging of breast: Secondary | ICD-10-CM | POA: Diagnosis not present

## 2016-08-21 DIAGNOSIS — R2231 Localized swelling, mass and lump, right upper limb: Secondary | ICD-10-CM

## 2016-08-21 DIAGNOSIS — M810 Age-related osteoporosis without current pathological fracture: Secondary | ICD-10-CM | POA: Diagnosis not present

## 2016-08-21 DIAGNOSIS — N6489 Other specified disorders of breast: Secondary | ICD-10-CM | POA: Diagnosis not present

## 2016-08-22 ENCOUNTER — Encounter: Payer: Self-pay | Admitting: Family Medicine

## 2016-08-22 NOTE — Telephone Encounter (Signed)
calicum --- it would be best to get in diet with milk, ca fortified oj or spinach,  Vita D 3  1000u daily Weight bearing exercise

## 2017-01-31 ENCOUNTER — Ambulatory Visit: Payer: Medicare Other | Admitting: Emergency Medicine

## 2017-02-13 ENCOUNTER — Encounter: Payer: Self-pay | Admitting: Emergency Medicine

## 2017-02-13 ENCOUNTER — Ambulatory Visit: Payer: Medicare HMO | Admitting: Emergency Medicine

## 2017-02-13 VITALS — BP 140/82 | HR 75 | Ht 65.0 in | Wt 190.0 lb

## 2017-02-13 DIAGNOSIS — J449 Chronic obstructive pulmonary disease, unspecified: Secondary | ICD-10-CM

## 2017-02-13 MED ORDER — UMECLIDINIUM-VILANTEROL 62.5-25 MCG/INH IN AEPB: 1.0000 | INHALATION_SPRAY | Freq: Every day | RESPIRATORY_TRACT | 5 refills | Status: DC

## 2017-02-13 NOTE — Assessment & Plan Note (Signed)
She has been off of maintenance inhaled medication for almost 2 years due to cost, losing her insurance.  I will try to get her back on maintenance bronchodilator.  Adjust based on her insurance formulary.  We will try restarting your Stiolto, 2 puffs once a day Use Ventolin 2 puffs as needed for shortness of breath, wheeze, chest tightness.  Is obtained your inhaler formulary from your insurance company.  This will allow [place] to see which inhalers get the best coverage, will be the least expensive.  We can review this and decide which inhaler to continue long-term. Check with your insurance to see if you are covered for the Prevnar 13 pneumonia vaccine.  Follow with Dr Lamonte Sakai in 2 months or sooner if you have any problems.

## 2017-02-13 NOTE — Progress Notes (Signed)
Subjective:    Patient ID: Jasmine Mclaughlin, female    DOB: 12/30/1950, 67 y.o.   MRN: [id number]  COPD  She complains of cough and shortness of breath. There is no wheezing. Pertinent negatives include no appetite change, ear pain, fever, headaches, postnasal drip, rhinorrhea, sneezing, sore throat or trouble swallowing. Her past medical history is significant for COPD.   67 year old woman, former smoker (25 pack years), with a history of osteoarthritis and reported asthma. She received this diagnosis in 10/2013 in the setting of wheeze, cough, dyspnea. She was started on QVAR and albuterol prn. She has subsequently also been given duoneb, uses 3-4x a week. Albuterol does help her wheeze, duonebs can help as well. Neither help as much as they used to do. She has progressed - more longstanding dyspnea, more frequent episodes of cough and wheeze.   In retrospect she has had similar episodes over the last 4-5 years   ROV 01/24/15 -- follow-up visit for suspected COPD with possible asthmatic component. I saw her in November and we started her on Stiolto and she believes that she benefited significantly.  Less "weight on her chest".  Feels that her breathing is better.  She has had a few episodes when air gets warm - causes her to be more SOB.  Sh has not tried albuterol on these occasions She underwent full pulmonary function testing today and I have reviewed the results personally. This showed mixed obstruction and restriction with a severely decreased FEV1 of 1.07 L (43% predicted). She had a vigorous bronchodilator response of 35%. Her lung volumes and diffusion capacity were both normal.   She has nasal gtt but no GERD.       ROV 66/7/19 --this is a follow-up visit for mixed obstruction and restrictive disease.  Her FEV1 is severely decreased and she has a vigorous bronchodilator response.  She also has allergic rhinitis.  She has been managed with Stiolto, had to stop it in May 2017 after she she lost her  insurance. Her breathing has been worse off of medication. She has identified perfume, cleaning solutiuons and warm weather, humidity as triggers.  She was using albuterol with triggers. She has been treated x2 with prednisone over the last 2 years.  Review of Systems  Constitutional: Negative for appetite change, fever and unexpected weight change.  HENT: Negative for congestion, dental problem, ear pain, nosebleeds, postnasal drip, rhinorrhea, sinus pressure, sneezing, sore throat and trouble swallowing.   Eyes: Negative for redness and itching.  Respiratory: Positive for cough and shortness of breath. Negative for chest tightness and wheezing.   Cardiovascular: Negative for palpitations and leg swelling.  Gastrointestinal: Negative for nausea and vomiting.  Genitourinary: Negative for dysuria.  Musculoskeletal: Negative for joint swelling.  Skin: Negative for rash.  Neurological: Negative for headaches.  Hematological: Does not bruise/bleed easily.  Psychiatric/Behavioral: Negative for dysphoric mood. The patient is not nervous/anxious.         Objective:   Physical Exam Vitals:   02/13/17 1018 02/13/17 1019  BP:  140/82  Pulse:  75  SpO2:  97%  Weight: 190 lb (86.2 kg)   Height: 5\' 5"  (1.651 m)    Gen: Pleasant, well-nourished, in no distress,  normal affect  ENT: No lesions,  mouth clear,  oropharynx clear, no postnasal drip  Neck: No JVD, no stridor  Lungs: No use of accessory muscles, no wheeze, no crackles.   Cardiovascular: RRR, heart sounds normal, no murmur or gallops, no peripheral edema  Musculoskeletal: No deformities, no  cyanosis or clubbing  Neuro: alert, non focal  Skin: Warm, no lesions or rashes     Assessment & Plan:  COPD (chronic obstructive pulmonary disease) ([place]) She has been off of maintenance inhaled medication for almost 2 years due to cost, losing her insurance.  I will try to get her back on maintenance bronchodilator.  Adjust based on her insurance formulary.  We will try restarting your Stiolto, 2 puffs once a day Use Ventolin 2 puffs as needed for shortness of breath, wheeze, chest tightness.  Is obtained your inhaler formulary from your insurance company.  This will allow [place] to see which inhalers get the best coverage, will be the least expensive.  We can review this and decide which inhaler to continue long-term. Check with your insurance to see if you are covered for the Prevnar 13 pneumonia vaccine.  Follow with Dr Lamonte Sakai in 2 months or sooner if you have any problems.  Baltazar Apo, MD, PhD 02/13/2017, 10:37 AM [facility] [phone] or if no answer [phone]

## 2017-02-13 NOTE — Patient Instructions (Addendum)
We will try restarting your Stiolto, 2 puffs once a day Use Ventolin 2 puffs as needed for shortness of breath, wheeze, chest tightness.  Is obtained your inhaler formulary from your insurance company.  This will allow [place] to see which inhalers get the best coverage, will be the least expensive.  We can review this and decide which inhaler to continue long-term. Check with your insurance to see if you are covered for the Prevnar 13 pneumonia vaccine.  Follow with Dr Lamonte Sakai in 2 months or sooner if you have any problems.

## 2017-02-22 DIAGNOSIS — H524 Presbyopia: Secondary | ICD-10-CM | POA: Diagnosis not present

## 2017-04-17 ENCOUNTER — Ambulatory Visit: Payer: Medicare HMO | Admitting: Emergency Medicine

## 2017-07-09 NOTE — Progress Notes (Deleted)
Subjective:   Jasmine Mclaughlin is a 67 y.o. female who presents for an Initial Medicare Annual Wellness Visit.  Review of Systems   No ROS.  Medicare Wellness Visit. Additional risk factors are reflected in the social history.   Sleep patterns:  Home Safety/Smoke Alarms: Feels safe in home. Smoke alarms in place.  Living environment; residence and Firearm Safety:   Female:   Pap-       Mammo- utd      Dexa scan- utd       CCS-     Objective:    There were no vitals filed for this visit. There is no height or weight on file to calculate BMI.  Advanced Directives 06/11/2016 05/08/2012 05/07/2012 04/28/2012  Does Patient Have a Medical Advance Directive? No Patient does not have advance directive Patient does not have advance directive Patient does not have advance directive;Patient would like information  Would patient like information on creating a medical advance directive? Yes (MAU/Ambulatory/Procedural Areas - Information given) - - Advance directive packet given  Pre-existing out of facility DNR order (yellow form or pink MOST form) - - No -    Current Medications (verified) Outpatient Encounter Medications as of 07/18/2017  Medication Sig  . acetaminophen (TYLENOL) 650 MG CR tablet Take 650 mg by mouth every 8 (eight) hours as needed for pain. Reported on 02/08/2015  . albuterol (PROVENTIL HFA;VENTOLIN HFA) 108 (90 Base) MCG/ACT inhaler Inhale 2 puffs into the lungs every 6 (six) hours as needed for wheezing or shortness of breath.  . cyclobenzaprine (FLEXERIL) 5 MG tablet Take 1 tablet (5 mg total) by mouth 3 (three) times daily as needed for muscle spasms.  Marland Kitchen umeclidinium-vilanterol (ANORO ELLIPTA) 62.5-25 MCG/INH AEPB Inhale 1 puff into the lungs daily.   No facility-administered encounter medications on file as of 07/18/2017.     Allergies (verified) Patient has no known allergies.   History: Past Medical History:  Diagnosis Date  . Arthritis    degenerative back, joint  disturbance "everywhere "  . Asthma   . Colon polyps   . Genital warts   . Shortness of breath    pt./ reports that she is out of shape   Past Surgical History:  Procedure Laterality Date  . ANTERIOR FUSION LUMBAR SPINE  05/06/2012  . ANTERIOR LAT LUMBAR FUSION N/A 05/06/2012   Procedure: ANTERIOR LATERAL LUMBAR FUSION 3 LEVELS;  Surgeon: Sinclair Ship, MD;  Location: [place];  Service: Orthopedics;  Laterality: N/A;  Lateral interbody fusion, lumbar 2-3,lumbar 3-4, lumbar 4-5 with instrumentation and allograft.  Marland Kitchen KNEE ARTHROSCOPY      R knee 01/2012, for ligament tears  . TONSILLECTOMY    . VAGINAL DELIVERY     x1   Family History  Problem Relation Age of Onset  . Arthritis Mother   . Arthritis Father   . Stroke Father   . Colon cancer Father 61  . Heart disease Father 49       chf  . Breast cancer Maternal Grandmother   . Asthma Maternal Grandmother   . Asthma Daughter    Social History   Socioeconomic History  . Marital status: Single    Spouse name: Not on file  . Number of children: 1  . Years of education: Not on file  . Highest education level: Not on file  Occupational History  . 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 Emergency planning/management officer  strain: Not on file  . Food insecurity:    Worry: Not on file    Inability: Not on file  . Transportation needs:    Medical: Not on file    Non-medical: Not on file  Tobacco Use  . Smoking status: Current Some Day Smoker    Packs/day: 0.10    Years: 45.00    Pack years: 4.50    Types: Cigarettes    Last attempt to quit: 10/10/2014    Years since quitting: 2.7  . Smokeless tobacco: Never Used  Substance and Sexual Activity  . Alcohol use: Yes    Alcohol/week: 0.0 oz    Comment: 2 times per month  . Drug use: No  . Sexual activity: Yes    Partners: Male  Lifestyle  . Physical activity:    Days per week: Not on file    Minutes per session: Not on file  . Stress:  Not on file  Relationships  . Social connections:    Talks on phone: Not on file    Gets together: Not on file    Attends religious service: Not on file    Active member of club or organization: Not on file    Attends meetings of clubs or organizations: Not on file    Relationship status: Not on file  Other Topics Concern  . Not on file  Social History Narrative   1 caffeine drinks daily   No exercise    Tobacco Counseling Ready to quit: Not Answered Counseling given: Not Answered   Clinical Intake:                        Activities of Daily Living No flowsheet data found.   Immunizations and Health Maintenance Immunization History  Administered Date(s) Administered  . Influenza-Unspecified 11/14/2014  . Pneumococcal Polysaccharide-23 02/15/2014  . Tdap 08/29/2010  . Zoster 10/14/2012   Health Maintenance Due  Topic Date Due  . COLONOSCOPY  10/25/2015  . PNA vac Low Risk Adult (1 of 2 - PCV13) 06/04/2016    Patient Care Team: Carollee Herter, Alferd Apa, DO as PCP - General (Family Medicine)  Indicate any recent Medical Services you may have received from other than [facility] providers in the past year (date may be approximate).     Assessment:   This is a routine wellness examination for [name].  Hearing/Vision screen No exam data present  Dietary issues and exercise activities discussed:    Goals    None     Depression Screen PHQ 2/9 Scores 06/11/2016 10/14/2012  PHQ - 2 Score 0 0    Fall Risk Fall Risk  06/11/2016  Falls in the past year? No    Is the patient's home free of loose throw rugs in walkways, pet beds, electrical cords, etc?   {Blank single:19197::"yes","no"}      Grab bars in the bathroom? {Blank single:19197::"yes","no"}      Handrails on the stairs?   {Blank single:19197::"yes","no"}      Adequate lighting?   {Blank single:19197::"yes","no"}  Timed Get Up and Go Performed ***  Cognitive Function: MMSE - Mini Mental State Exam  06/11/2016  Orientation to time 5  Orientation to Place 5  Registration 3  Attention/ Calculation 5  Recall 3  Language- name 2 objects 2  Language- repeat 1  Language- follow 3 step command 3  Language- read & follow direction 1  Write a sentence 1  Copy design 1  Total score  30        Screening Tests Health Maintenance  Topic Date Due  . COLONOSCOPY  10/25/2015  . PNA vac Low Risk Adult (1 of 2 - PCV13) 06/04/2016  . INFLUENZA VACCINE  08/07/2017  . MAMMOGRAM  08/21/2017  . TETANUS/TDAP  08/28/2020  . DEXA SCAN  Completed  . Hepatitis C Screening  Completed    Qualifies for Shingles Vaccine? ***  Cancer Screenings: Lung: Low Dose CT Chest recommended if Age 22-80 years, 30 pack-year currently smoking OR have quit w/in 15years. Patient {DOES NOT does:27190::"does not"} qualify. Breast: Up to date on Mammogram? {Yes/No:30480221}   Up to date of Bone Density/Dexa? {Yes/No:30480221} Colorectal: ***  Additional Screenings: *** Hepatitis C Screening:      Plan:   ***  I have personally reviewed and noted the following in the patient's chart:   . Medical and social history . Use of alcohol, tobacco or illicit drugs  . Current medications and supplements . Functional ability and status . Nutritional status . Physical activity . Advanced directives . List of other physicians . Hospitalizations, surgeries, and ER visits in previous 12 months . Vitals . Screenings to include cognitive, depression, and falls . Referrals and appointments  In addition, I have reviewed and discussed with patient certain preventive protocols, quality metrics, and best practice recommendations. A written personalized care plan for preventive services as well as general preventive health recommendations were provided to patient.     Shela Nevin, [place]   07/09/2017

## 2017-07-18 ENCOUNTER — Ambulatory Visit: Payer: Medicare HMO | Admitting: *Deleted

## 2017-08-05 NOTE — Progress Notes (Signed)
Subjective:   Jasmine Mclaughlin is a 67 y.o. female who presents for Medicare Annual (Subsequent) preventive examination.  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   Review of Systems: No ROS.  Medicare Wellness Visit. Additional risk factors are reflected in the social history. Cardiac Risk Factors include: advanced age (>24men, >33 women);obesity (BMI >30kg/m2) Sleep patterns:   Home Safety/Smoke Alarms: Feels safe in home. Smoke alarms in place.  Living environment; residence and Firearm Safety: 1 story home. Lives with significant other.  Eye- wearing glasses. Last exam 01/07/17.  Female:   Pap- pt will discuss with PCP       Mammo-utd       Dexa scan- utd       CCS- ordered. Due 2017.     Objective:     Vitals: BP 136/64 (BP Location: Left Arm, Patient Position: Sitting, Cuff Size: Normal)   Pulse 61   Ht 5\' 5"  (1.651 m)   Wt 179 lb (81.2 kg)   SpO2 95%   BMI 29.79 kg/m   Body mass index is 29.79 kg/m.  Advanced Directives 08/11/2017 06/11/2016 05/08/2012 05/07/2012 04/28/2012  Does Patient Have a Medical Advance Directive? No No Patient does not have advance directive Patient does not have advance directive Patient does not have advance directive;Patient would like information  Would patient like information on creating a medical advance directive? No - Patient declined Yes (MAU/Ambulatory/Procedural Areas - Information given) - - Advance directive packet given  Pre-existing out of facility DNR order (yellow form or pink MOST form) - - - No -    Tobacco Social History   Tobacco Use  Smoking Status Current Some Day Smoker  . Packs/day: 0.10  . Years: 45.00  . Pack years: 4.50  . Types: Cigarettes  . Last attempt to quit: 10/10/2014  . Years since quitting: 2.8  Smokeless Tobacco Never Used     Ready to quit: Not Answered Counseling given: Not Answered   Clinical Intake: Pain : No/denies pain     Past Medical History:  Diagnosis Date  . Arthritis    degenerative back, joint disturbance "everywhere "  . Asthma   . Colon polyps   . Genital warts   . Osteoporosis   . Shortness of breath    pt./ reports that she is out of shape   Past Surgical History:  Procedure Laterality Date  . ANTERIOR FUSION LUMBAR SPINE  05/06/2012  . ANTERIOR LAT LUMBAR FUSION N/A 05/06/2012   Procedure: ANTERIOR LATERAL LUMBAR FUSION 3 LEVELS;  Surgeon: Sinclair Ship, MD;  Location: [place];  Service: Orthopedics;  Laterality: N/A;  Lateral interbody fusion, lumbar 2-3,lumbar 3-4, lumbar 4-5 with instrumentation and allograft.  Marland Kitchen KNEE ARTHROSCOPY      R knee 01/2012, for ligament tears  . TONSILLECTOMY    . VAGINAL DELIVERY     x1   Family History  Problem Relation Age of Onset  . Arthritis Mother   . Arthritis Father   . Stroke Father   . Colon cancer Father 41  . Heart disease Father 67       chf  . Breast cancer Maternal Grandmother   . Asthma Maternal Grandmother   . Asthma Daughter    Social History   Socioeconomic History  . Marital status: Single    Spouse name: Not on file  . Number of children: 1  . Years of education: Not on file  . Highest education level: Not on file  Occupational History  . 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 Financial resource strain: Not on file  . Food insecurity:    Worry: Not on file    Inability: Not on file  . Transportation needs:    Medical: Not on file    Non-medical: Not on file  Tobacco Use  . Smoking status: Current Some Day Smoker    Packs/day: 0.10    Years: 45.00    Pack years: 4.50    Types: Cigarettes    Last attempt to quit: 10/10/2014    Years since quitting: 2.8  . Smokeless tobacco: Never Used  Substance and Sexual Activity  . Alcohol use: Yes    Alcohol/week: 0.0 oz    Comment: 2 times per month  . Drug use: No  . Sexual activity: Not Currently    Partners: Male  Lifestyle  . Physical activity:    Days per week: Not on  file    Minutes per session: Not on file  . Stress: Not on file  Relationships  . Social connections:    Talks on phone: Not on file    Gets together: Not on file    Attends religious service: Not on file    Active member of club or organization: Not on file    Attends meetings of clubs or organizations: Not on file    Relationship status: Not on file  Other Topics Concern  . Not on file  Social History Narrative   1 caffeine drinks daily   No exercise    Outpatient Encounter Medications as of 08/11/2017  Medication Sig  . acetaminophen (TYLENOL) 650 MG CR tablet Take 650 mg by mouth every 8 (eight) hours as needed for pain. Reported on 02/08/2015  . albuterol (PROVENTIL HFA;VENTOLIN HFA) 108 (90 Base) MCG/ACT inhaler Inhale 2 puffs into the lungs every 6 (six) hours as needed for wheezing or shortness of breath.  . cyclobenzaprine (FLEXERIL) 5 MG tablet Take 1 tablet (5 mg total) by mouth 3 (three) times daily as needed for muscle spasms.  . [DISCONTINUED] umeclidinium-vilanterol (ANORO ELLIPTA) 62.5-25 MCG/INH AEPB Inhale 1 puff into the lungs daily.   No facility-administered encounter medications on file as of 08/11/2017.     Activities of Daily Living In your present state of health, do you have any difficulty performing the following activities: 08/11/2017  Hearing? N  Vision? N  Difficulty concentrating or making decisions? N  Walking or climbing stairs? N  Dressing or bathing? N  Doing errands, shopping? N  Preparing Food and eating ? N  Using the Toilet? N  In the past six months, have you accidently leaked urine? N  Do you have problems with loss of bowel control? N  Managing your Medications? N  Managing your Finances? N  Housekeeping or managing your Housekeeping? N  Some recent data might be hidden    Patient Care Team: Carollee Herter, Alferd Apa, DO as PCP - General (Family Medicine)    Assessment:   This is a routine wellness examination for [place]. Physical  assessment deferred to PCP.  Exercise Activities and Dietary recommendations Current Exercise Habits: Home exercise routine, Time (Minutes): 15, Frequency (Times/Week): 3, Weekly Exercise (Minutes/Week): 45, Intensity: Mild, Exercise limited by: None identified   Diet (meal preparation, eat out, water intake, caffeinated beverages, dairy products, fruits and vegetables): pt states she would like to start drinking more water and eating healthier.  Goals    . DIET - INCREASE WATER INTAKE     At least 3 glasses per day.        Fall Risk Fall Risk  06/11/2016  Falls in the past year? No   Depression Screen PHQ 2/9 Scores 08/11/2017 06/11/2016 10/14/2012  PHQ - 2 Score 1 0 0     Cognitive Function Ad8 score reviewed for issues:  Issues making decisions:no  Less interest in hobbies / activities:no  Repeats questions, stories (family complaining):no  Trouble using ordinary gadgets (microwave, computer, phone):no  Forgets the month or year: no  Mismanaging finances: no  Remembering appts:no  Daily problems with thinking and/or memory:no Ad8 score is=0  MMSE - Mini Mental State Exam 06/11/2016  Orientation to time 5  Orientation to Place 5  Registration 3  Attention/ Calculation 5  Recall 3  Language- name 2 objects 2  Language- repeat 1  Language- follow 3 step command 3  Language- read & follow direction 1  Write a sentence 1  Copy design 1  Total score 30        Immunization History  Administered Date(s) Administered  . Influenza-Unspecified 11/14/2014  . Pneumococcal Polysaccharide-23 02/15/2014  . Tdap 08/29/2010  . Zoster 10/14/2012    Screening Tests Health Maintenance  Topic Date Due  . COLONOSCOPY  10/25/2015  . PNA vac Low Risk Adult (1 of 2 - PCV13) 06/04/2016  . INFLUENZA VACCINE  08/07/2017  . MAMMOGRAM  08/21/2017  . TETANUS/TDAP  08/28/2020  . DEXA SCAN  Completed  . Hepatitis C Screening  Completed      Plan:    Please schedule your  next medicare wellness visit with me in 1 yr.  Schedule appointment to follow up with Dr.Lowne.  Eat heart healthy diet (full of fruits, vegetables, whole grains, lean protein, water--limit salt, fat, and sugar intake) and increase physical activity as tolerated.  Bring a copy of your living will and/or healthcare power of attorney to your next office visit.  I have ordered your colonoscopy. They will call you to schedule.  Pt reports taking up to 6 Tylenol per day x1 yr. States she takes it more as a routine now. Discussed risks and pt agrees to cut back on Tylenol to take as needed. Pt also reports she may try CBD oil to see if it helps.  I have personally reviewed and noted the following in the patient's chart:   . Medical and social history . Use of alcohol, tobacco or illicit drugs  . Current medications and supplements . Functional ability and status . Nutritional status . Physical activity . Advanced directives . List of other physicians . Hospitalizations, surgeries, and ER visits in previous 12 months . Vitals . Screenings to include cognitive, depression, and falls . Referrals and appointments  In addition, I have reviewed and discussed with patient certain preventive protocols, quality metrics, and best practice recommendations. A written personalized care plan for preventive services as well as general preventive health recommendations were provided to patient.     Shela Nevin, [place]  08/11/2017

## 2017-08-11 ENCOUNTER — Encounter: Payer: Self-pay | Admitting: *Deleted

## 2017-08-11 ENCOUNTER — Ambulatory Visit (INDEPENDENT_AMBULATORY_CARE_PROVIDER_SITE_OTHER): Payer: Medicare HMO | Admitting: *Deleted

## 2017-08-11 VITALS — BP 136/64 | HR 61 | Ht 65.0 in | Wt 179.0 lb

## 2017-08-11 DIAGNOSIS — Z0000 Encounter for general adult medical examination without abnormal findings: Secondary | ICD-10-CM

## 2017-08-11 DIAGNOSIS — Z1211 Encounter for screening for malignant neoplasm of colon: Secondary | ICD-10-CM

## 2017-08-11 NOTE — Patient Instructions (Addendum)
Please schedule your next medicare wellness visit with me in 1 yr.  Schedule appointment to follow up with Dr.Lowne.  Eat heart healthy diet (full of fruits, vegetables, whole grains, lean protein, water--limit salt, fat, and sugar intake) and increase physical activity as tolerated.  Bring a copy of your living will and/or healthcare power of attorney to your next office visit.  I have ordered your colonoscopy. They will call you to schedule.  Jasmine Mclaughlin , Thank you for taking time to come for your Medicare Wellness Visit. I appreciate your ongoing commitment to your health goals. Please review the following plan we discussed and let me know if I can assist you in the future.   These are the goals we discussed: Goals    . DIET - INCREASE WATER INTAKE     At least 3 glasses per day.        This is a list of the screening recommended for you and due dates:  Health Maintenance  Topic Date Due  . Colon Cancer Screening  10/25/2015  . Pneumonia vaccines (1 of 2 - PCV13) 06/04/2016  . Flu Shot  08/07/2017  . Mammogram  08/21/2017  . Tetanus Vaccine  08/28/2020  . DEXA scan (bone density measurement)  Completed  .  Hepatitis C: One time screening is recommended by Center for Disease Control  (CDC) for  adults born from [birth year] through 1964.   Completed   Health Maintenance for Postmenopausal Women Menopause is a normal process in which your reproductive ability comes to an end. This process happens gradually over a span of months to years, usually between the ages of 75 and 56. Menopause is complete when you have missed 12 consecutive menstrual periods. It is important to talk with your health care provider about some of the most common conditions that affect postmenopausal women, such as heart disease, cancer, and bone loss (osteoporosis). Adopting a healthy lifestyle and getting preventive care can help to promote your health and wellness. Those actions can also lower your chances of  developing some of these common conditions. What should I know about menopause? During menopause, you may experience a number of symptoms, such as:  Moderate-to-severe hot flashes.  Night sweats.  Decrease in sex drive.  Mood swings.  Headaches.  Tiredness.  Irritability.  Memory problems.  Insomnia.  Choosing to treat or not to treat menopausal changes is an individual decision that you make with your health care provider. What should I know about hormone replacement therapy and supplements? Hormone therapy products are effective for treating symptoms that are associated with menopause, such as hot flashes and night sweats. Hormone replacement carries certain risks, especially as you become older. If you are thinking about using estrogen or estrogen with progestin treatments, discuss the benefits and risks with your health care provider. What should I know about heart disease and stroke? Heart disease, heart attack, and stroke become more likely as you age. This may be due, in part, to the hormonal changes that your body experiences during menopause. These can affect how your body processes dietary fats, triglycerides, and cholesterol. Heart attack and stroke are both medical emergencies. There are many things that you can do to help prevent heart disease and stroke:  Have your blood pressure checked at least every 1-2 years. High blood pressure causes heart disease and increases the risk of stroke.  If you are 53-74 years old, ask your health care provider if you should take aspirin to prevent a heart  attack or a stroke.  Do not use any tobacco products, including cigarettes, chewing tobacco, or electronic cigarettes. If you need help quitting, ask your health care provider.  It is important to eat a healthy diet and maintain a healthy weight. ? Be sure to include plenty of vegetables, fruits, low-fat dairy products, and lean protein. ? Avoid eating foods that are high in solid  fats, added sugars, or salt (sodium).  Get regular exercise. This is one of the most important things that you can do for your health. ? Try to exercise for at least 150 minutes each week. The type of exercise that you do should increase your heart rate and make you sweat. This is known as moderate-intensity exercise. ? Try to do strengthening exercises at least twice each week. Do these in addition to the moderate-intensity exercise.  Know your numbers.Ask your health care provider to check your cholesterol and your blood glucose. Continue to have your blood tested as directed by your health care provider.  What should I know about cancer screening? There are several types of cancer. Take the following steps to reduce your risk and to catch any cancer development as early as possible. Breast Cancer  Practice breast self-awareness. ? This means understanding how your breasts normally appear and feel. ? It also means doing regular breast self-exams. Let your health care provider know about any changes, no matter how small.  If you are 33 or older, have a clinician do a breast exam (clinical breast exam or CBE) every year. Depending on your age, family history, and medical history, it may be recommended that you also have a yearly breast X-ray (mammogram).  If you have a family history of breast cancer, talk with your health care provider about genetic screening.  If you are at high risk for breast cancer, talk with your health care provider about having an MRI and a mammogram every year.  Breast cancer (BRCA) gene test is recommended for women who have family members with BRCA-related cancers. Results of the assessment will determine the need for genetic counseling and BRCA1 and for BRCA2 testing. BRCA-related cancers include these types: ? Breast. This occurs in males or females. ? Ovarian. ? Tubal. This may also be called fallopian tube cancer. ? Cancer of the abdominal or pelvic lining  (peritoneal cancer). ? Prostate. ? Pancreatic.  Cervical, Uterine, and Ovarian Cancer Your health care provider may recommend that you be screened regularly for cancer of the pelvic organs. These include your ovaries, uterus, and vagina. This screening involves a pelvic exam, which includes checking for microscopic changes to the surface of your cervix (Pap test).  For women ages 21-65, health care providers may recommend a pelvic exam and a Pap test every three years. For women ages 85-65, they may recommend the Pap test and pelvic exam, combined with testing for human papilloma virus (HPV), every five years. Some types of HPV increase your risk of cervical cancer. Testing for HPV may also be done on women of any age who have unclear Pap test results.  Other health care providers may not recommend any screening for nonpregnant women who are considered low risk for pelvic cancer and have no symptoms. Ask your health care provider if a screening pelvic exam is right for you.  If you have had past treatment for cervical cancer or a condition that could lead to cancer, you need Pap tests and screening for cancer for at least 20 years after your treatment. If  Pap tests have been discontinued for you, your risk factors (such as having a new sexual partner) need to be reassessed to determine if you should start having screenings again. Some women have medical problems that increase the chance of getting cervical cancer. In these cases, your health care provider may recommend that you have screening and Pap tests more often.  If you have a family history of uterine cancer or ovarian cancer, talk with your health care provider about genetic screening.  If you have vaginal bleeding after reaching menopause, tell your health care provider.  There are currently no reliable tests available to screen for ovarian cancer.  Lung Cancer Lung cancer screening is recommended for adults 69-25 years old who are at  high risk for lung cancer because of a history of smoking. A yearly low-dose CT scan of the lungs is recommended if you:  Currently smoke.  Have a history of at least 30 pack-years of smoking and you currently smoke or have quit within the past 15 years. A pack-year is smoking an average of one pack of cigarettes per day for one year.  Yearly screening should:  Continue until it has been 15 years since you quit.  Stop if you develop a health problem that would prevent you from having lung cancer treatment.  Colorectal Cancer  This type of cancer can be detected and can often be prevented.  Routine colorectal cancer screening usually begins at age 65 and continues through age 37.  If you have risk factors for colon cancer, your health care provider may recommend that you be screened at an earlier age.  If you have a family history of colorectal cancer, talk with your health care provider about genetic screening.  Your health care provider may also recommend using home test kits to check for hidden blood in your stool.  A small camera at the end of a tube can be used to examine your colon directly (sigmoidoscopy or colonoscopy). This is done to check for the earliest forms of colorectal cancer.  Direct examination of the colon should be repeated every 5-10 years until age 42. However, if early forms of precancerous polyps or small growths are found or if you have a family history or genetic risk for colorectal cancer, you may need to be screened more often.  Skin Cancer  Check your skin from head to toe regularly.  Monitor any moles. Be sure to tell your health care provider: ? About any new moles or changes in moles, especially if there is a change in a mole's shape or color. ? If you have a mole that is larger than the size of a pencil eraser.  If any of your family members has a history of skin cancer, especially at a young age, talk with your health care provider about genetic  screening.  Always use sunscreen. Apply sunscreen liberally and repeatedly throughout the day.  Whenever you are outside, protect yourself by wearing long sleeves, pants, a wide-brimmed hat, and sunglasses.  What should I know about osteoporosis? Osteoporosis is a condition in which bone destruction happens more quickly than new bone creation. After menopause, you may be at an increased risk for osteoporosis. To help prevent osteoporosis or the bone fractures that can happen because of osteoporosis, the following is recommended:  If you are 21-73 years old, get at least 1,000 mg of calcium and at least 600 mg of vitamin D per day.  If you are older than age 11 but  younger than age 42, get at least 1,200 mg of calcium and at least 600 mg of vitamin D per day.  If you are older than age 20, get at least 1,200 mg of calcium and at least 800 mg of vitamin D per day.  Smoking and excessive alcohol intake increase the risk of osteoporosis. Eat foods that are rich in calcium and vitamin D, and do weight-bearing exercises several times each week as directed by your health care provider. What should I know about how menopause affects my mental health? Depression may occur at any age, but it is more common as you become older. Common symptoms of depression include:  Low or sad mood.  Changes in sleep patterns.  Changes in appetite or eating patterns.  Feeling an overall lack of motivation or enjoyment of activities that you previously enjoyed.  Frequent crying spells.  Talk with your health care provider if you think that you are experiencing depression. What should I know about immunizations? It is important that you get and maintain your immunizations. These include:  Tetanus, diphtheria, and pertussis (Tdap) booster vaccine.  Influenza every year before the flu season begins.  Pneumonia vaccine.  Shingles vaccine.  Your health care provider may also recommend other  immunizations. This information is not intended to replace advice given to you by your health care provider. Make sure you discuss any questions you have with your health care provider. Document Released: 02/15/2005 Document Revised: 07/14/2015 Document Reviewed: 09/27/2014 Elsevier Interactive Patient Education  2018 [employer].

## 2017-08-11 NOTE — Progress Notes (Signed)
Reviewed  Yvonne R Lowne Chase, DO  

## 2017-08-29 ENCOUNTER — Other Ambulatory Visit (HOSPITAL_COMMUNITY)
Admission: RE | Admit: 2017-08-29 | Discharge: 2017-08-29 | Disposition: A | Discharge status: Home / Self Care | Payer: Medicare HMO | Source: Ambulatory Visit | Attending: Family Medicine | Admitting: Family Medicine

## 2017-08-29 ENCOUNTER — Encounter: Payer: Self-pay | Admitting: Family Medicine

## 2017-08-29 ENCOUNTER — Ambulatory Visit (INDEPENDENT_AMBULATORY_CARE_PROVIDER_SITE_OTHER): Payer: Medicare HMO | Admitting: Family Medicine

## 2017-08-29 VITALS — BP 132/81 | HR 57 | Temp 99.3°F | Resp 16 | Ht 65.0 in | Wt 179.0 lb

## 2017-08-29 DIAGNOSIS — Z136 Encounter for screening for cardiovascular disorders: Secondary | ICD-10-CM | POA: Diagnosis not present

## 2017-08-29 DIAGNOSIS — Z720 Tobacco use: Secondary | ICD-10-CM | POA: Diagnosis not present

## 2017-08-29 DIAGNOSIS — Z23 Encounter for immunization: Secondary | ICD-10-CM | POA: Diagnosis not present

## 2017-08-29 DIAGNOSIS — R69 Illness, unspecified: Secondary | ICD-10-CM | POA: Diagnosis not present

## 2017-08-29 DIAGNOSIS — Z01419 Encounter for gynecological examination (general) (routine) without abnormal findings: Secondary | ICD-10-CM | POA: Diagnosis not present

## 2017-08-29 DIAGNOSIS — F419 Anxiety disorder, unspecified: Secondary | ICD-10-CM

## 2017-08-29 DIAGNOSIS — Z0000 Encounter for general adult medical examination without abnormal findings: Secondary | ICD-10-CM | POA: Insufficient documentation

## 2017-08-29 DIAGNOSIS — E7849 Other hyperlipidemia: Secondary | ICD-10-CM

## 2017-08-29 DIAGNOSIS — M5441 Lumbago with sciatica, right side: Secondary | ICD-10-CM | POA: Diagnosis not present

## 2017-08-29 DIAGNOSIS — G8929 Other chronic pain: Secondary | ICD-10-CM

## 2017-08-29 LAB — CBC WITH DIFFERENTIAL/PLATELET
Basophils Absolute: 0.1 10*3/uL (ref 0.0–0.1)
Basophils Relative: 1 % (ref 0.0–3.0)
EOS PCT: 2.6 % (ref 0.0–5.0)
Eosinophils Absolute: 0.2 10*3/uL (ref 0.0–0.7)
HCT: 43.3 % (ref 36.0–46.0)
Hemoglobin: 14.4 g/dL (ref 12.0–15.0)
LYMPHS ABS: 3.2 10*3/uL (ref 0.7–4.0)
Lymphocytes Relative: 37 % (ref 12.0–46.0)
MCHC: 33.1 g/dL (ref 30.0–36.0)
MCV: 97.5 fl (ref 78.0–100.0)
MONO ABS: 0.6 10*3/uL (ref 0.1–1.0)
Monocytes Relative: 7.5 % (ref 3.0–12.0)
NEUTROS PCT: 51.9 % (ref 43.0–77.0)
Neutro Abs: 4.5 10*3/uL (ref 1.4–7.7)
Platelets: 367 10*3/uL (ref 150.0–400.0)
RBC: 4.44 Mil/uL (ref 3.87–5.11)
RDW: 14.5 % (ref 11.5–15.5)
WBC: 8.6 10*3/uL (ref 4.0–10.5)

## 2017-08-29 LAB — COMPREHENSIVE METABOLIC PANEL
ALT: 11 U/L (ref 0–35)
AST: 12 U/L (ref 0–37)
Albumin: 4.4 g/dL (ref 3.5–5.2)
Alkaline Phosphatase: 101 U/L (ref 39–117)
BUN: 11 mg/dL (ref 6–23)
CO2: 29 meq/L (ref 19–32)
Calcium: 10.1 mg/dL (ref 8.4–10.5)
Chloride: 102 mEq/L (ref 96–112)
Creatinine, Ser: 0.83 mg/dL (ref 0.40–1.20)
GFR: 73.05 mL/min (ref >60.00)
Glucose, Bld: 80 mg/dL (ref 70–99)
POTASSIUM: 4.7 meq/L (ref 3.5–5.1)
SODIUM: 140 meq/L (ref 135–145)
TOTAL PROTEIN: 7 g/dL (ref 6.0–8.3)
Total Bilirubin: 0.5 mg/dL (ref 0.2–1.2)

## 2017-08-29 LAB — LIPID PANEL
Cholesterol: 180 mg/dL (ref 0–200)
HDL: 43.2 mg/dL (ref >39.00)
LDL Cholesterol: 104 mg/dL — ABNORMAL HIGH (ref 0–99)
NONHDL: 136.44
Total CHOL/HDL Ratio: 4
Triglycerides: 161 mg/dL — ABNORMAL HIGH (ref 0.0–149.0)
VLDL: 32.2 mg/dL (ref 0.0–40.0)

## 2017-08-29 MED ORDER — CITALOPRAM HYDROBROMIDE 10 MG PO TABS: 10.0000 mg | ORAL_TABLET | Freq: Every day | ORAL | 1 refills | Status: DC

## 2017-08-29 MED ORDER — CYCLOBENZAPRINE HCL 5 MG PO TABS: 5.0000 mg | ORAL_TABLET | Freq: Three times a day (TID) | ORAL | 1 refills | Status: DC | PRN

## 2017-08-29 NOTE — Progress Notes (Signed)
Subjective:     Jasmine Mclaughlin is a 67 y.o. female and is here for a comprehensive physical exam. The patient reports problems - arthritis pain worse in the summer,  she is also asking to go on celexa again.  she is more irritatble .  Social History   Socioeconomic History  . Marital status: Single    Spouse name: Not on file  . Number of children: 1  . Years of education: Not on file  . Highest education level: Not on file  Occupational History  . 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 Financial resource strain: Not on file  . Food insecurity:    Worry: Not on file    Inability: Not on file  . Transportation needs:    Medical: Not on file    Non-medical: Not on file  Tobacco Use  . Smoking status: Current Some Day Smoker    Packs/day: 1.00    Years: 55.00    Pack years: 55.00    Types: Cigarettes    Last attempt to quit: 10/10/2014    Years since quitting: 2.8  . Smokeless tobacco: Never Used  Substance and Sexual Activity  . Alcohol use: Yes    Alcohol/week: 0.0 standard drinks    Comment: 2 times per month  . Drug use: No  . Sexual activity: Not Currently    Partners: Male  Lifestyle  . Physical activity:    Days per week: Not on file    Minutes per session: Not on file  . Stress: Not on file  Relationships  . Social connections:    Talks on phone: Not on file    Gets together: Not on file    Attends religious service: Not on file    Active member of club or organization: Not on file    Attends meetings of clubs or organizations: Not on file    Relationship status: Not on file  . Intimate partner violence:    Fear of current or ex partner: Not on file    Emotionally abused: Not on file    Physically abused: Not on file    Forced sexual activity: Not on file  Other Topics Concern  . Not on file  Social History Narrative   1 caffeine drinks daily   No exercise   Health Maintenance  Topic Date Due  . COLONOSCOPY   10/25/2015  . PNA vac Low Risk Adult (1 of 2 - PCV13) 06/04/2016  . MAMMOGRAM  08/21/2017  . INFLUENZA VACCINE  08/07/2017  . TETANUS/TDAP  08/28/2020  . DEXA SCAN  Completed  . Hepatitis C Screening  Completed    The following portions of the patient's history were reviewed and updated as appropriate:  She  has a past medical history of Arthritis, Asthma, Colon polyps, Genital warts, Osteoporosis, and Shortness of breath. She does not have any pertinent problems on file. She  has a past surgical history that includes Knee arthroscopy; Tonsillectomy; Vaginal delivery; Anterior fusion lumbar spine (05/06/2012); and Anterior lat lumbar fusion (N/A, 05/06/2012). Her family history includes Arthritis in her father and mother; Asthma in her daughter and maternal grandmother; Breast cancer in her maternal grandmother; Colon cancer (age of onset: 46) in her father; Heart disease (age of onset: 58) in her father; Stroke in her father. She  reports that she has been smoking cigarettes. She has a 55.00 pack-year smoking history. She has never used smokeless  tobacco. She reports that she drinks alcohol. She reports that she does not use drugs. She has a current medication list which includes the following prescription(s): acetaminophen, albuterol, cyclobenzaprine, and citalopram. Current Outpatient Medications on File Prior to Visit  Medication Sig Dispense Refill  . acetaminophen (TYLENOL) 650 MG CR tablet Take 650 mg by mouth every 8 (eight) hours as needed for pain. Reported on 02/08/2015    . albuterol (PROVENTIL HFA;VENTOLIN HFA) 108 (90 Base) MCG/ACT inhaler Inhale 2 puffs into the lungs every 6 (six) hours as needed for wheezing or shortness of breath. 1 Inhaler 5   No current facility-administered medications on file prior to visit.    She has No Known Allergies..  Review of Systems .Review of Systems  Constitutional: Negative for activity change, appetite change and fatigue.  HENT: Negative for  hearing loss, congestion, tinnitus and ear discharge.  dentist q18m Eyes: Negative for visual disturbance (see optho q1y -- vision corrected to 20/20 with glasses).  Respiratory: Negative for cough, chest tightness and shortness of breath.   Cardiovascular: Negative for chest pain, palpitations and leg swelling.  Gastrointestinal: Negative for abdominal pain, diarrhea, constipation and abdominal distention.  Genitourinary: Negative for urgency, frequency, decreased urine volume and difficulty urinating.  Musculoskeletal: Negative for back pain, arthralgias and gait problem.  Skin: Negative for color change, pallor and rash.  Neurological: Negative for dizziness, light-headedness, numbness and headaches.  Hematological: Negative for adenopathy. Does not bruise/bleed easily.  Psychiatric/Behavioral: Negative for suicidal ideas, confusion, sleep disturbance, self-injury, dysphoric mood, decreased concentration and agitation.       Objective:    BP 132/81 (BP Location: Right Arm, Patient Position: Sitting, Cuff Size: Small)   Pulse (!) 57   Temp 99.3 F (37.4 C) (Oral)   Resp 16   Ht 5\' 5"  (1.651 m)   Wt 179 lb (81.2 kg)   SpO2 97%   BMI 29.79 kg/m  General appearance: alert, cooperative, appears stated age and no distress Head: Normocephalic, without obvious abnormality, atraumatic Eyes: conjunctivae/corneas clear. PERRL, EOM's intact. Fundi benign. Ears: normal TM's and external ear canals both ears Nose: Nares normal. Septum midline. Mucosa normal. No drainage or sinus tenderness. Throat: lips, mucosa, and tongue normal; teeth and gums normal Neck: no adenopathy, no carotid bruit, no JVD, supple, symmetrical, trachea midline and thyroid not enlarged, symmetric, no tenderness/mass/nodules Back: symmetric, no curvature. ROM normal. No CVA tenderness. Lungs: clear to auscultation bilaterally Breasts: normal appearance, no masses or tenderness Heart: regular rate and rhythm, S1, S2  normal, no murmur, click, rub or gallop Abdomen: soft, non-tender; bowel sounds normal; no masses,  no organomegaly Pelvic: cervix normal in appearance, external genitalia normal, no adnexal masses or tenderness, no cervical motion tenderness, rectovaginal septum normal, uterus normal size, shape, and consistency, vagina normal without discharge and pap done, rectal==heme neg brown stool Extremities: extremities normal, atraumatic, no cyanosis or edema Pulses: 2+ and symmetric Skin: Skin color, texture, turgor normal. No rashes or lesions Lymph nodes: Cervical, supraclavicular, and axillary nodes normal. Neurologic: Alert and oriented X 3, normal strength and tone. Normal symmetric reflexes. Normal coordination and gait    Assessment:    Healthy female exam.   Plan:    ghm utd Check labs   See After Visit Summary for Counseling Recommendations    1. Tobacco abuse   - Ambulatory Referral for Lung Cancer Scre - Lipid panel - CBC with Differential/Platelet - Comprehensive metabolic panel  2. Chronic right-sided low back pain with right-sided sciatica  -  cyclobenzaprine (FLEXERIL) 5 MG tablet; Take 1 tablet (5 mg total) by mouth 3 (three) times daily as needed for muscle spasms.  Dispense: 90 tablet; Refill: 1 - Lipid panel - CBC with Differential/Platelet - Comprehensive metabolic panel  3. Preventative health care See above - Cytology - PAP  4. Need for pneumococcal vaccination   - Pneumococcal conjugate vaccine 13-valent  5. Ischemic heart disease screen   - Lipid panel - CBC with Differential/Platelet - Comprehensive metabolic panel  6. Anxiety  Pt would like to restart celexa  - citalopram (CELEXA) 10 MG tablet; Take 1 tablet (10 mg total) by mouth daily.  Dispense: 90 tablet; Refill: 1

## 2017-08-29 NOTE — Patient Instructions (Signed)
Preventive Care 66 Years and Older, Female Preventive care refers to lifestyle choices and visits with your health care provider that can promote health and wellness. What does preventive care include?  A yearly physical exam. This is also called an annual well check.  Dental exams once or twice a year.  Routine eye exams. Ask your health care provider how often you should have your eyes checked.  Personal lifestyle choices, including: ? Daily care of your teeth and gums. ? Regular physical activity. ? Eating a healthy diet. ? Avoiding tobacco and drug use. ? Limiting alcohol use. ? Practicing safe sex. ? Taking low-dose aspirin every day. ? Taking vitamin and mineral supplements as recommended by your health care provider. What happens during an annual well check? The services and screenings done by your health care provider during your annual well check will depend on your age, overall health, lifestyle risk factors, and family history of disease. Counseling Your health care provider may ask you questions about your:  Alcohol use.  Tobacco use.  Drug use.  Emotional well-being.  Home and relationship well-being.  Sexual activity.  Eating habits.  History of falls.  Memory and ability to understand (cognition).  Work and work environment.  Reproductive health.  Screening You may have the following tests or measurements:  Height, weight, and BMI.  Blood pressure.  Lipid and cholesterol levels. These may be checked every 5 years, or more frequently if you are over 50 years old.  Skin check.  Lung cancer screening. You may have this screening every year starting at age 55 if you have a 30-pack-year history of smoking and currently smoke or have quit within the past 15 years.  Fecal occult blood test (FOBT) of the stool. You may have this test every year starting at age 50.  Flexible sigmoidoscopy or colonoscopy. You may have a sigmoidoscopy every 5 years or  a colonoscopy every 10 years starting at age 50.  Hepatitis C blood test.  Hepatitis B blood test.  Sexually transmitted disease (STD) testing.  Diabetes screening. This is done by checking your blood sugar (glucose) after you have not eaten for a while (fasting). You may have this done every 1-3 years.  Bone density scan. This is done to screen for osteoporosis. You may have this done starting at age 65.  Mammogram. This may be done every 1-2 years. Talk to your health care provider about how often you should have regular mammograms.  Talk with your health care provider about your test results, treatment options, and if necessary, the need for more tests. Vaccines Your health care provider may recommend certain vaccines, such as:  Influenza vaccine. This is recommended every year.  Tetanus, diphtheria, and acellular pertussis (Tdap, Td) vaccine. You may need a Td booster every 10 years.  Varicella vaccine. You may need this if you have not been vaccinated.  Zoster vaccine. You may need this after age 60.  Measles, mumps, and rubella (MMR) vaccine. You may need at least one dose of MMR if you were born in 1957 or later. You may also need a second dose.  Pneumococcal 13-valent conjugate (PCV13) vaccine. One dose is recommended after age 65.  Pneumococcal polysaccharide (PPSV23) vaccine. One dose is recommended after age 65.  Meningococcal vaccine. You may need this if you have certain conditions.  Hepatitis A vaccine. You may need this if you have certain conditions or if you travel or work in places where you may be exposed to hepatitis   A.  Hepatitis B vaccine. You may need this if you have certain conditions or if you travel or work in places where you may be exposed to hepatitis B.  Haemophilus influenzae type b (Hib) vaccine. You may need this if you have certain conditions.  Talk to your health care provider about which screenings and vaccines you need and how often you  need them. This information is not intended to replace advice given to you by your health care provider. Make sure you discuss any questions you have with your health care provider. Document Released: 01/20/2015 Document Revised: 09/13/2015 Document Reviewed: 10/25/2014 Elsevier Interactive Patient Education  2018 [employer]  

## 2017-09-02 LAB — CYTOLOGY - PAP
Diagnosis: NEGATIVE
HPV (WINDOPATH): NOT DETECTED

## 2017-09-04 ENCOUNTER — Telehealth: Payer: Self-pay | Admitting: *Deleted

## 2017-09-04 NOTE — Telephone Encounter (Signed)
Pharmacy requesting PA for cyclobenzaprine and patient has medicare part B.  Do you want to change to Zanaflex?

## 2017-09-04 NOTE — Telephone Encounter (Signed)
zanaflex 4 mg 1 po tid prn #30

## 2017-09-05 MED ORDER — TIZANIDINE HCL 4 MG PO CAPS: 4.0000 mg | ORAL_CAPSULE | Freq: Three times a day (TID) | ORAL | 0 refills | Status: DC

## 2017-09-05 NOTE — Addendum Note (Signed)
Addended by: Wynonia Musty A on: 09/05/2017 10:21 AM   Modules accepted: Orders

## 2017-09-05 NOTE — Telephone Encounter (Signed)
New rx has been sent

## 2017-09-10 ENCOUNTER — Telehealth: Payer: Self-pay | Admitting: Acute Care

## 2017-09-10 DIAGNOSIS — Z122 Encounter for screening for malignant neoplasm of respiratory organs: Secondary | ICD-10-CM

## 2017-09-10 DIAGNOSIS — F17210 Nicotine dependence, cigarettes, uncomplicated: Secondary | ICD-10-CM

## 2017-09-12 NOTE — Telephone Encounter (Signed)
LMTC x 1  

## 2017-09-15 NOTE — Telephone Encounter (Signed)
Spoke with pt and scheduled SDMV 10/03/17 2:00 CT ordered Nothing further needed

## 2017-09-22 ENCOUNTER — Encounter: Payer: Self-pay | Admitting: Internal Medicine

## 2017-09-22 ENCOUNTER — Ambulatory Visit (AMBULATORY_SURGERY_CENTER): Payer: Self-pay

## 2017-09-22 VITALS — Ht 65.0 in | Wt 179.0 lb

## 2017-09-22 DIAGNOSIS — Z86010 Personal history of colonic polyps: Secondary | ICD-10-CM

## 2017-09-22 MED ORDER — NA SULFATE-K SULFATE-MG SULF 17.5-3.13-1.6 GM/177ML PO SOLN: 1.0000 | Freq: Once | ORAL | 0 refills | Status: AC

## 2017-09-22 NOTE — Progress Notes (Signed)
Denies allergies to eggs or soy products. Denies complication of anesthesia or sedation. Denies use of weight loss medication. Denies use of O2.   Emmi instructions declined.  

## 2017-09-29 ENCOUNTER — Encounter: Payer: Self-pay | Admitting: Internal Medicine

## 2017-09-29 ENCOUNTER — Ambulatory Visit (AMBULATORY_SURGERY_CENTER): Payer: Medicare HMO | Admitting: Internal Medicine

## 2017-09-29 VITALS — BP 140/65 | HR 48 | Temp 97.8°F | Resp 13 | Ht 65.0 in | Wt 179.0 lb

## 2017-09-29 DIAGNOSIS — D122 Benign neoplasm of ascending colon: Secondary | ICD-10-CM

## 2017-09-29 DIAGNOSIS — Z86010 Personal history of colonic polyps: Secondary | ICD-10-CM

## 2017-09-29 DIAGNOSIS — D123 Benign neoplasm of transverse colon: Secondary | ICD-10-CM

## 2017-09-29 DIAGNOSIS — Z1211 Encounter for screening for malignant neoplasm of colon: Secondary | ICD-10-CM | POA: Diagnosis not present

## 2017-09-29 DIAGNOSIS — D120 Benign neoplasm of cecum: Secondary | ICD-10-CM

## 2017-09-29 MED ORDER — SODIUM CHLORIDE 0.9 % IV SOLN: 500.0000 mL | Freq: Once | INTRAVENOUS | Status: DC

## 2017-09-29 NOTE — Progress Notes (Signed)
Called to room to assist during endoscopic procedure.  Patient ID and intended procedure confirmed with present staff. Received instructions for my participation in the procedure from the performing physician.  

## 2017-09-29 NOTE — Patient Instructions (Signed)
Discharge instructions given. handouts on polyps,diverticulosis and hemorrhoids. Resume previous medications. Office will schedule repeat colonoscopy in the outpatient hospital. YOU HAD AN ENDOSCOPIC PROCEDURE TODAY AT [facility]:   Refer to the procedure report that was given to you for any specific questions about what was found during the examination.  If the procedure report does not answer your questions, please call your gastroenterologist to clarify.  If you requested that your care partner not be given the details of your procedure findings, then the procedure report has been included in a sealed envelope for you to review at your convenience later.  YOU SHOULD EXPECT: Some feelings of bloating in the abdomen. Passage of more gas than usual.  Walking can help get rid of the air that was put into your GI tract during the procedure and reduce the bloating. If you had a lower endoscopy (such as a colonoscopy or flexible sigmoidoscopy) you may notice spotting of blood in your stool or on the toilet paper. If you underwent a bowel prep for your procedure, you may not have a normal bowel movement for a few days.  Please Note:  You might notice some irritation and congestion in your nose or some drainage.  This is from the oxygen used during your procedure.  There is no need for concern and it should clear up in a day or so.  SYMPTOMS TO REPORT IMMEDIATELY:   Following lower endoscopy (colonoscopy or flexible sigmoidoscopy):  Excessive amounts of blood in the stool  Significant tenderness or worsening of abdominal pains  Swelling of the abdomen that is new, acute  Fever of 100F or higher   For urgent or emergent issues, a gastroenterologist can be reached at any hour by calling [phone].   DIET:  We do recommend a small meal at first, but then you may proceed to your regular diet.  Drink plenty of fluids but you should avoid alcoholic beverages for 24  hours.  ACTIVITY:  You should plan to take it easy for the rest of today and you should NOT DRIVE or use heavy machinery until tomorrow (because of the sedation medicines used during the test).    FOLLOW UP: Our staff will call the number listed on your records the next business day following your procedure to check on you and address any questions or concerns that you may have regarding the information given to you following your procedure. If we do not reach you, we will leave a message.  However, if you are feeling well and you are not experiencing any problems, there is no need to return our call.  We will assume that you have returned to your regular daily activities without incident.  If any biopsies were taken you will be contacted by phone or by letter within the next 1-3 weeks.  Please call us at [phone] if you have not heard about the biopsies in 3 weeks.    SIGNATURES/CONFIDENTIALITY: You and/or your care partner have signed paperwork which will be entered into your electronic medical record.  These signatures attest to the fact that that the information above on your After Visit Summary has been reviewed and is understood.  Full responsibility of the confidentiality of this discharge information lies with you and/or your care-partner.

## 2017-09-29 NOTE — Progress Notes (Signed)
To PACU, VSS. Report to [email or web address] 

## 2017-09-29 NOTE — Op Note (Signed)
[facility] Patient Name: Jasmine Mclaughlin Procedure Date: 09/29/2017 1:25 PM MRN: [id number] Endoscopist: Jerene Bears , MD Age: 67 Referring MD:  Date of Birth: [birth year] Gender: Female Account #: [id number] Procedure:                Colonoscopy Indications:              High risk colon cancer surveillance: Personal                            history of non-advanced adenoma, Last colonoscopy:                            October 2012 Medicines:                Monitored Anesthesia Care Procedure:                Pre-Anesthesia Assessment:                           - Prior to the procedure, a History and Physical                            was performed, and patient medications and                            allergies were reviewed. The patient's tolerance of                            previous anesthesia was also reviewed. The risks                            and benefits of the procedure and the sedation                            options and risks were discussed with the patient.                            All questions were answered, and informed consent                            was obtained. Prior Anticoagulants: The patient has                            taken no previous anticoagulant or antiplatelet                            agents. ASA Grade Assessment: II - A patient with                            mild systemic disease. After reviewing the risks                            and benefits, the patient was deemed in  satisfactory condition to undergo the procedure.                           After obtaining informed consent, the colonoscope                            was passed under direct vision. Throughout the                            procedure, the patient's blood pressure, pulse, and                            oxygen saturations were monitored continuously. The                            Model PCF-[id number]) scope was introduced                         through the anus and advanced to the cecum,                            identified by appendiceal orifice and ileocecal                            valve. The colonoscopy was performed without                            difficulty. The patient tolerated the procedure                            well. The quality of the bowel preparation was                            good. The ileocecal valve, appendiceal orifice, and                            rectum were photographed. Scope In: 1:44:36 PM Scope Out: 2:06:33 PM Scope Withdrawal Time: 0 hours 15 minutes 11 seconds  Total Procedure Duration: 0 hours 21 minutes 57 seconds  Findings:                 The digital rectal exam was normal.                           A 10 mm polyp was found in the cecum. The polyp was                            pedunculated. The polyp was removed with a hot                            snare. Resection and retrieval were complete.                           A 4 mm polyp was found in the ascending colon. The  polyp was sessile. The polyp was removed with a                            cold snare. Resection and retrieval were complete.                           A 5 mm polyp was found in the hepatic flexure. The                            polyp was sessile. The polyp was removed with a                            cold snare. Resection and retrieval were complete.                           A 25-30 mm polyp was found in the proximal                            transverse colon. The polyp was sessile and                            laterally spreading involving approximately one                            third of the luminal circumference. Biopsies were                            taken with a cold forceps for histology. EMR not                            attempted today.                           Multiple small and large-mouthed diverticula were                            found in  the sigmoid colon and descending colon.                           Internal hemorrhoids were found during                            retroflexion. The hemorrhoids were medium-sized. Complications:            No immediate complications. Estimated Blood Loss:     Estimated blood loss was minimal. Impression:               - One 10 mm polyp in the cecum, removed with a hot                            snare. Resected and retrieved.                           - One 4 mm polyp in the ascending  colon, removed                            with a cold snare. Resected and retrieved.                           - One 5 mm polyp at the hepatic flexure, removed                            with a cold snare. Resected and retrieved.                           - One 25-30 mm polyp in the proximal transverse                            colon. Biopsied.                           - Diverticulosis in the sigmoid colon and in the                            descending colon.                           - Internal hemorrhoids. Recommendation:           - Patient has a contact number available for                            emergencies. The signs and symptoms of potential                            delayed complications were discussed with the                            patient. Return to normal activities tomorrow.                            Written discharge instructions were provided to the                            patient.                           - Resume previous diet.                           - Continue present medications.                           - Await pathology results.                           - Repeat colonoscopy is recommended for EMR of                            large proximal transverse colon polyp.  This will                            need to occur in the outpatient hospital setting                            with monitored anesthesia care. The colonoscopy                            date will be  determined after pathology results                            from today's exam become available for review. Jerene Bears, MD 09/29/2017 2:15:03 PM This report has been signed electronically.

## 2017-09-30 ENCOUNTER — Telehealth: Payer: Self-pay

## 2017-09-30 NOTE — Telephone Encounter (Signed)
  Follow up Call-  Call back number 09/29/2017  Post procedure Call Back phone  # [phone]  Permission to leave phone message Yes  Some recent data might be hidden     Patient questions:  Do you have a fever, pain , or abdominal swelling? No. Pain Score  0 *  Have you tolerated food without any problems? Yes.    Have you been able to return to your normal activities? Yes.    Do you have any questions about your discharge instructions: Diet   No. Medications  No. Follow up visit  No.  Do you have questions or concerns about your Care? No.  Actions: * If pain score is 4 or above: No action needed, pain <4.

## 2017-10-03 ENCOUNTER — Ambulatory Visit (INDEPENDENT_AMBULATORY_CARE_PROVIDER_SITE_OTHER): Payer: Medicare HMO | Admitting: Acute Care

## 2017-10-03 ENCOUNTER — Ambulatory Visit (INDEPENDENT_AMBULATORY_CARE_PROVIDER_SITE_OTHER)
Admission: RE | Admit: 2017-10-03 | Discharge: 2017-10-03 | Disposition: A | Discharge status: Home / Self Care | Payer: Medicare HMO | Source: Ambulatory Visit | Attending: Acute Care | Admitting: Acute Care

## 2017-10-03 ENCOUNTER — Encounter: Payer: Self-pay | Admitting: Acute Care

## 2017-10-03 DIAGNOSIS — F17210 Nicotine dependence, cigarettes, uncomplicated: Secondary | ICD-10-CM

## 2017-10-03 DIAGNOSIS — Z122 Encounter for screening for malignant neoplasm of respiratory organs: Secondary | ICD-10-CM

## 2017-10-03 DIAGNOSIS — R69 Illness, unspecified: Secondary | ICD-10-CM | POA: Diagnosis not present

## 2017-10-03 IMAGING — CT CT CHEST LUNG CANCER SCREENING LOW DOSE W/O CM
2 of 4 series · 15 of 40 positions shown, 18 images · non-contrast
Comparison: None

CLINICAL DATA: Lung cancer screening. Fifty-three pack-year
history. Current asymptomatic smoker.

EXAM:
CT CHEST WITHOUT CONTRAST LOW-DOSE FOR LUNG CANCER SCREENING
TECHNIQUE: Multidetector CT imaging of the chest was performed following the
standard protocol without IV contrast.

[Series 2: thorax 5.0 i31f 3 · axial · 0.62mm/px · z∈[-290,-30]mm · 12 of 58 slices shown, 15 images]
[im 3/58  mediastinal]
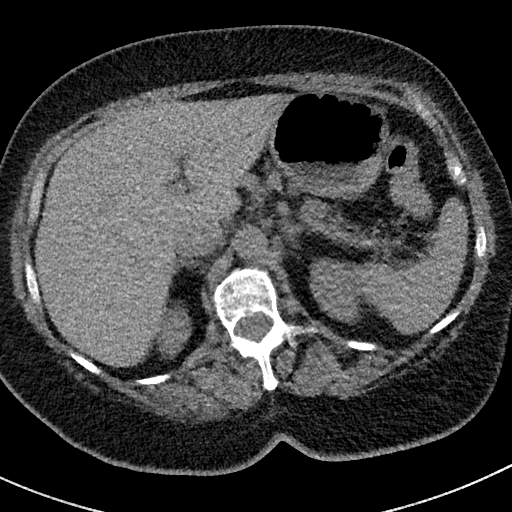
[im 3/58  lung]
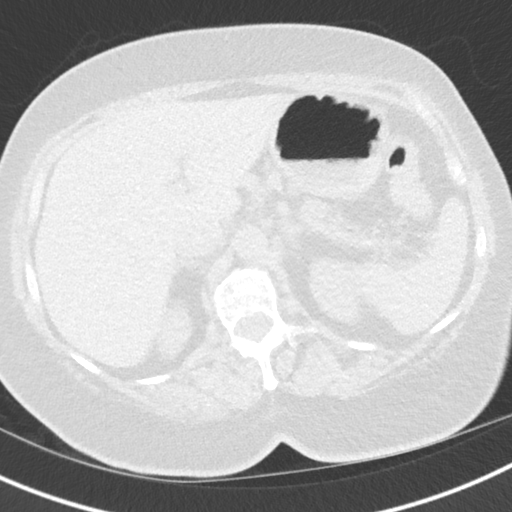
[im 8/58  lung]
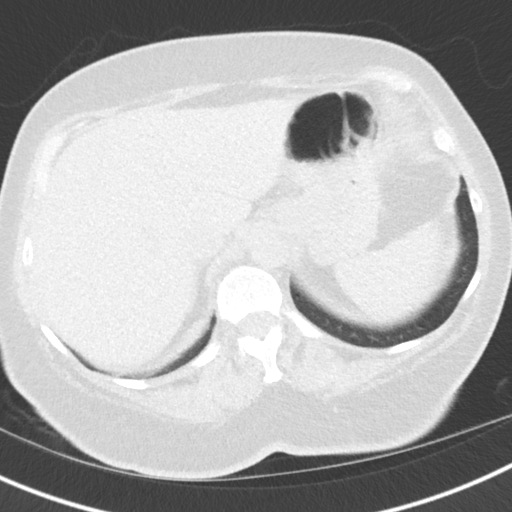
[im 12/58  lung]
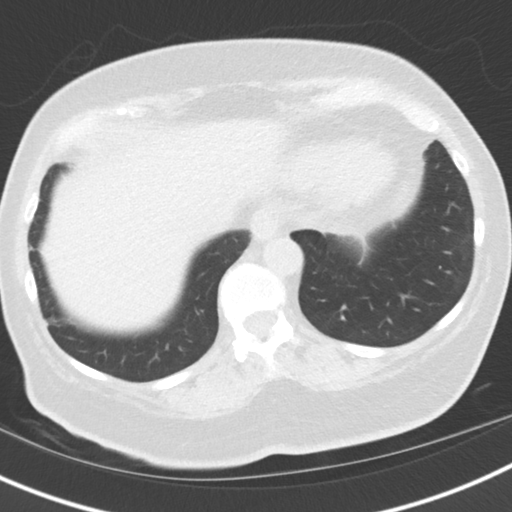
[im 17/58  lung]
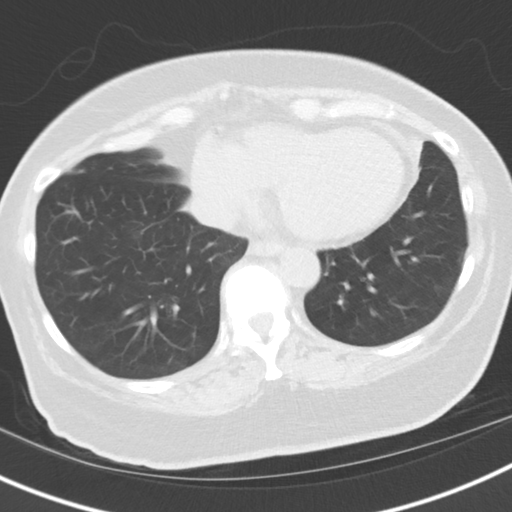
[im 22/58  mediastinal]
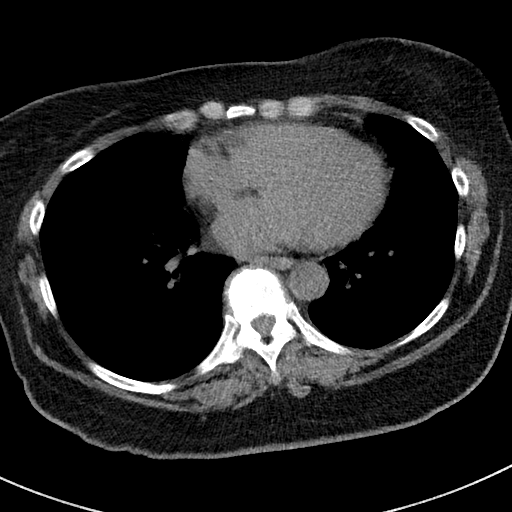
[im 22/58  lung]
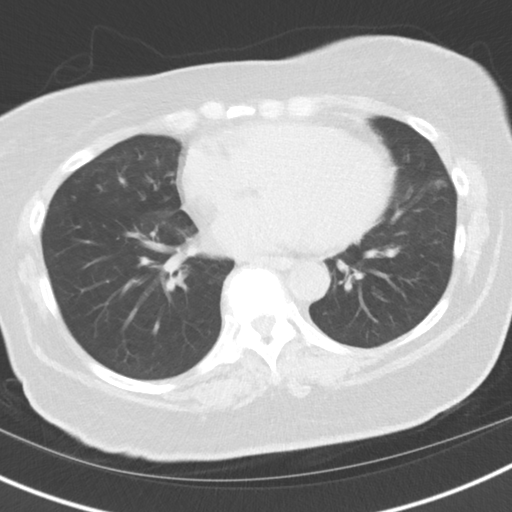
[im 27/58  lung]
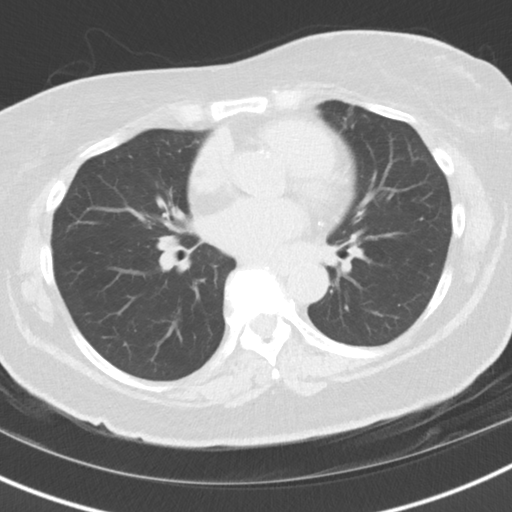
[im 31/58  lung]
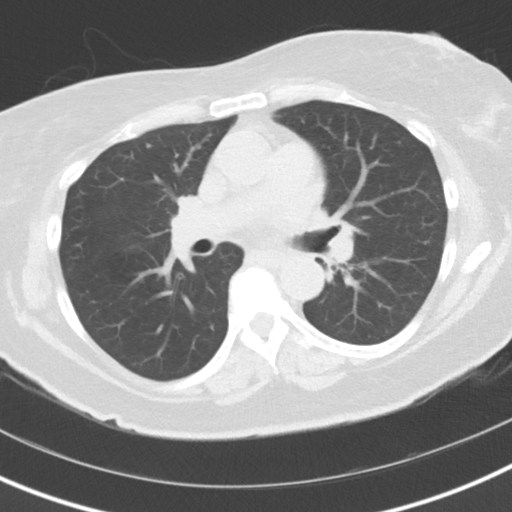
[im 36/58  lung]
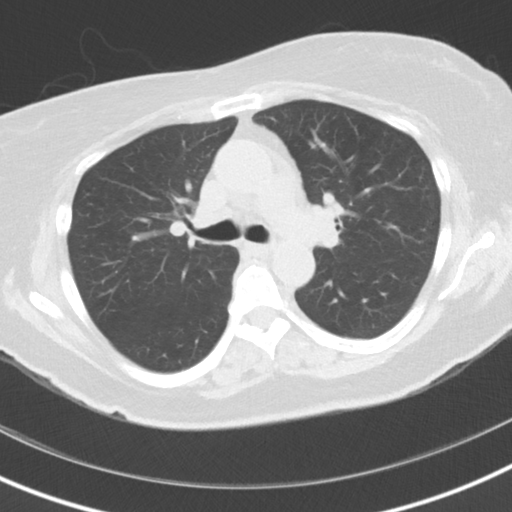
[im 41/58  mediastinal]
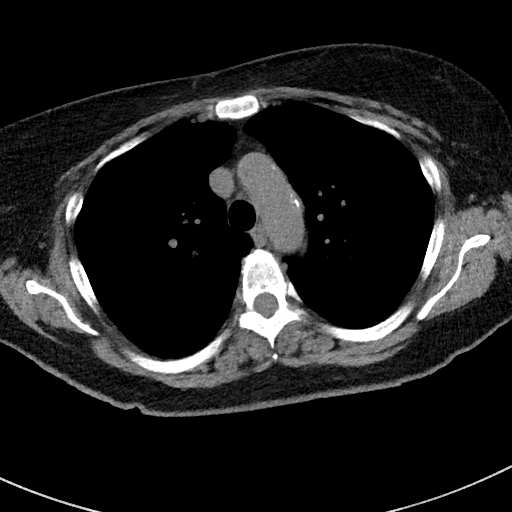
[im 41/58  lung]
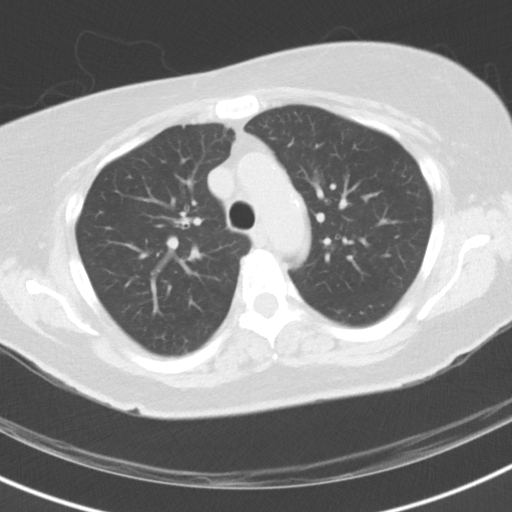
[im 46/58  lung]
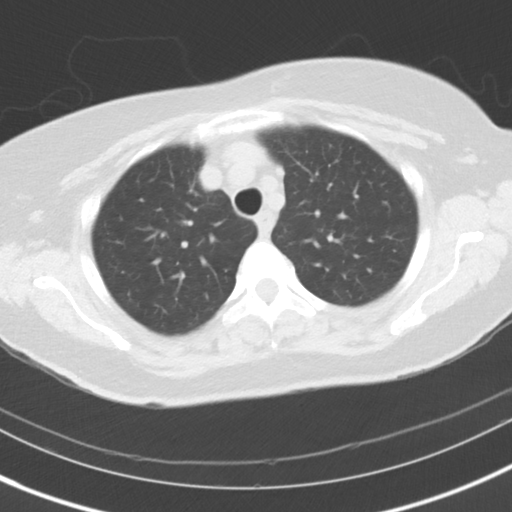
[im 50/58  lung]
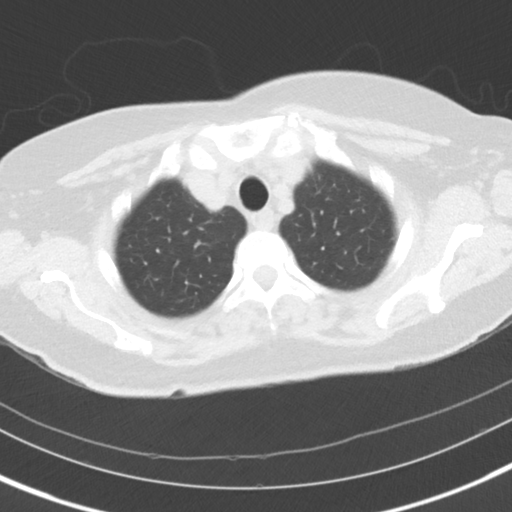
[im 55/58  lung]
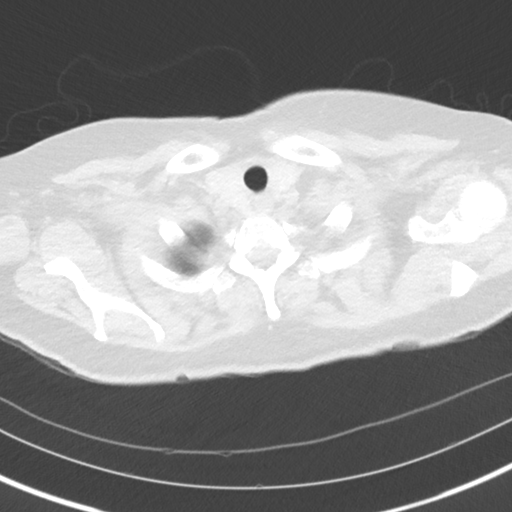

[Series 5: coronal · coronal · 0.55mm/px · 3 of 117 slices shown]
[im 24/117  lung]
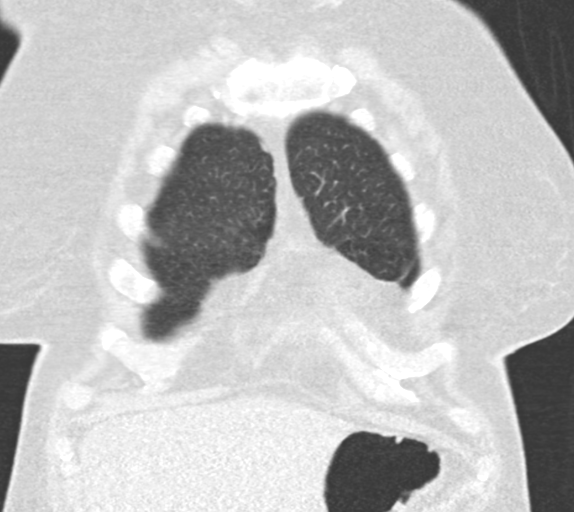
[im 47/117  lung]
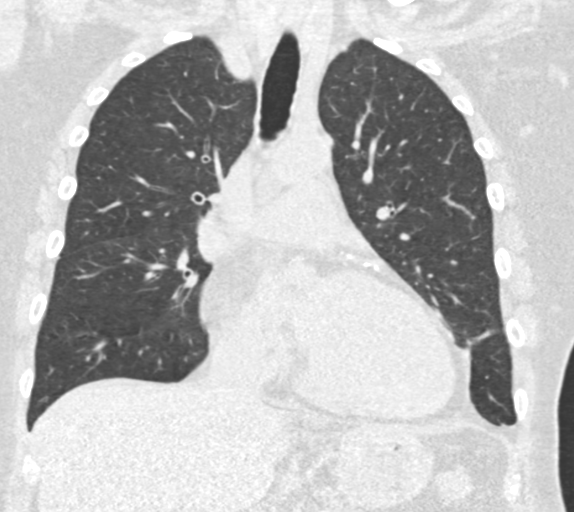
[im 70/117  lung]
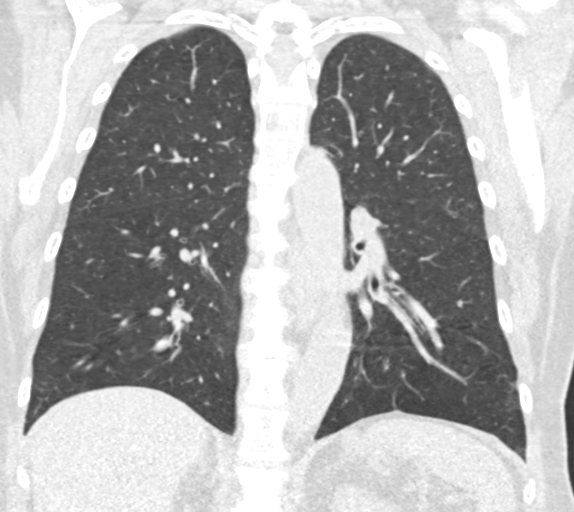

[15 of 40 positions shown; findings below may reference images not displayed]

FINDINGS: Cardiovascular: The heart size appears within normal limits. Aortic
atherosclerosis. Calcification in the LAD and left circumflex
coronary artery noted.

Mediastinum/Nodes: Normal appearance of the thyroid gland. The
trachea appears patent and is midline. Normal appearance of the
esophagus. No enlarged axillary or supraclavicular lymph nodes. No
mediastinal or hilar adenopathy.

Lungs/Pleura: No pleural effusion. No airspace consolidation,
atelectasis or pneumothorax. There are a few small nodules
identified within the right lung. The largest is in the right upper
lobe with an equivalent diameter of 3.2 mm.

Upper Abdomen: No acute abnormality.

Musculoskeletal: No chest wall mass or suspicious bone lesions
identified.
IMPRESSION: 1. Lung-RADS 2, benign appearance or behavior. Continue annual
screening with low-dose chest CT without contrast in 12 months.
2.  Aortic Atherosclerosis ([K0]-[K0]).
3. Coronary artery atherosclerotic calcifications.

## 2017-10-03 NOTE — Progress Notes (Signed)
Shared Decision Making Visit Lung Cancer Screening Program [phone])   Eligibility:  Age 67 y.o.  Pack Years Smoking History Calculation 53 pack year smoking history (# packs/per year x # years smoked)  Recent History of coughing up blood  no  Unexplained weight loss? no ( >Than 15 pounds within the last 6 months )  Prior History Lung / other cancer no (Diagnosis within the last 5 years already requiring surveillance chest CT Scans).  Smoking Status Current Smoker  Former Smokers: Years since quit: NA  Quit Date: NA  Visit Components:  Discussion included one or more decision making aids. yes  Discussion included risk/benefits of screening. yes  Discussion included potential follow up diagnostic testing for abnormal scans. yes  Discussion included meaning and risk of over diagnosis. yes  Discussion included meaning and risk of False Positives. yes  Discussion included meaning of total radiation exposure. yes  Counseling Included:  Importance of adherence to annual lung cancer LDCT screening. yes  Impact of comorbidities on ability to participate in the program. yes  Ability and willingness to under diagnostic treatment. yes  Smoking Cessation Counseling:  Current Smokers:   Discussed importance of smoking cessation. yes  Information about tobacco cessation classes and interventions provided to patient. yes  Patient provided with "ticket" for LDCT Scan. yes  Symptomatic Patient. no  CounselingNA  Diagnosis Code: Tobacco Use Z72.0  Asymptomatic Patient yes  Counseling (Intermediate counseling: > three minutes counseling) V8938  Former Smokers:   Discussed the importance of maintaining cigarette abstinence. yes  Diagnosis Code: Personal History of Nicotine Dependence. B01.751  Information about tobacco cessation classes and interventions provided to patient. Yes  Patient provided with "ticket" for LDCT Scan. yes  Written Order for Lung Cancer  Screening with LDCT placed in Epic. Yes (CT Chest Lung Cancer Screening Low Dose W/O CM) [id number] Z12.2-Screening of respiratory organs Z87.891-Personal history of nicotine dependence   I have spent 25 minutes of face to face time with Ms. Kennard discussing the risks and benefits of lung cancer screening. We viewed a power point together that explained in detail the above noted topics. We paused at intervals to allow for questions to be asked and answered to ensure understanding.We discussed that the single most powerful action that she can take to decrease her risk of developing lung cancer is to quit smoking. We discussed whether or not she is ready to commit to setting a quit date. She is not ready to set a quit date.She is down to one cigarette daily, which she has in the morning with her coffee. We discussed options for tools to aid in quitting smoking including nicotine replacement therapy, non-nicotine medications, support groups, Quit Smart classes, and behavior modification. We discussed that often times setting smaller, more achievable goals, such as eliminating 1 cigarette a day for a week and then 2 cigarettes a day for a week can be helpful in slowly decreasing the number of cigarettes smoked. This allows for a sense of accomplishment as well as providing a clinical benefit. I gave her the " Be Stronger Than Your Excuses" card with contact information for community resources, classes, free nicotine replacement therapy, and access to mobile apps, text messaging, and on-line smoking cessation help. I have also given her my card and contact information in the event she needs to contact me. We discussed the time and location of the scan, and that either Doroteo Glassman RN or I will call with the results within 24-48 hours of receiving  them. I have offered her  a copy of the power point we viewed  as a resource in the event they need reinforcement of the concepts we discussed today in the office. The patient  verbalized understanding of all of  the above and had no further questions upon leaving the office. They have my contact information in the event they have any further questions.  I spent 4 minutes counseling on smoking cessation and the health risks of continued tobacco abuse.  I explained to the patient that there has been a high incidence of coronary artery disease noted on these exams. I explained that this is a non-gated exam therefore degree or severity cannot be determined. This patient is not on statin therapy. I have asked the patient to follow-up with their PCP regarding any incidental finding of coronary artery disease and management with diet or medication as their PCP  feels is clinically indicated. The patient verbalized understanding of the above and had no further questions upon completion of the visit.     Magdalen Spatz, NP 10/03/2017 2:39 PM

## 2017-10-06 ENCOUNTER — Other Ambulatory Visit: Payer: Self-pay | Admitting: Acute Care

## 2017-10-06 DIAGNOSIS — Z122 Encounter for screening for malignant neoplasm of respiratory organs: Secondary | ICD-10-CM

## 2017-10-06 DIAGNOSIS — F17210 Nicotine dependence, cigarettes, uncomplicated: Secondary | ICD-10-CM

## 2017-10-06 NOTE — Progress Notes (Signed)
Aortic atherosclerosis --- take aspirin 81 mg daily May need to start lipid lowering meds ---  If not checked in last 6 months should be done

## 2017-11-14 ENCOUNTER — Other Ambulatory Visit (INDEPENDENT_AMBULATORY_CARE_PROVIDER_SITE_OTHER): Payer: Medicare HMO

## 2017-11-14 ENCOUNTER — Ambulatory Visit: Payer: Medicare HMO | Admitting: Gastroenterology

## 2017-11-14 ENCOUNTER — Encounter: Payer: Self-pay | Admitting: Gastroenterology

## 2017-11-14 VITALS — BP 140/70 | HR 82 | Ht 65.0 in | Wt 179.0 lb

## 2017-11-14 DIAGNOSIS — D126 Benign neoplasm of colon, unspecified: Secondary | ICD-10-CM

## 2017-11-14 DIAGNOSIS — Z86010 Personal history of colonic polyps: Secondary | ICD-10-CM | POA: Diagnosis not present

## 2017-11-14 LAB — BASIC METABOLIC PANEL
BUN: 17 mg/dL (ref 6–23)
CHLORIDE: 104 meq/L (ref 96–112)
CO2: 30 mEq/L (ref 19–32)
CREATININE: 0.83 mg/dL (ref 0.40–1.20)
Calcium: 10.3 mg/dL (ref 8.4–10.5)
GFR: 73 mL/min (ref >60.00)
Glucose, Bld: 96 mg/dL (ref 70–99)
Potassium: 4.7 mEq/L (ref 3.5–5.1)
Sodium: 142 mEq/L (ref 135–145)

## 2017-11-14 LAB — CBC
HEMATOCRIT: 44.6 % (ref 36.0–46.0)
Hemoglobin: 14.9 g/dL (ref 12.0–15.0)
MCHC: 33.5 g/dL (ref 30.0–36.0)
MCV: 96.4 fl (ref 78.0–100.0)
Platelets: 368 10*3/uL (ref 150.0–400.0)
RBC: 4.62 Mil/uL (ref 3.87–5.11)
RDW: 14 % (ref 11.5–15.5)
WBC: 9 10*3/uL (ref 4.0–10.5)

## 2017-11-14 LAB — PROTIME-INR
INR: 1 ratio (ref 0.8–1.0)
Prothrombin Time: 12 s (ref 9.6–13.1)

## 2017-11-14 MED ORDER — PEG 3350-KCL-NA BICARB-NACL 420 G PO SOLR: 4000.0000 mL | ORAL | 0 refills | Status: DC

## 2017-11-14 NOTE — Progress Notes (Signed)
GASTROENTEROLOGY OUTPATIENT CLINIC VISIT   Primary Care Provider Ann Held, DO [address] [place] [address] [phone]  Referring Provider [address] DO [place] [address] [place], [place] [address] [phone]  Patient Profile: Jasmine Mclaughlin is a 67 y.o. female with a pmh significant for COPD/Asthma, Anxiety, Osteoporosis, OA, Colon Polyps.  The patient presents to the [facility] for an evaluation and management of problem(s) noted below:  Problem List 1. History of colonic polyps   2. Tubular adenoma of colon     History of Present Illness: This is a patient that I am seeing in consultation for consideration of advanced polyp resection.  In September 2019 she underwent a high risk colon cancer surveillance for prior history of nonadvanced adenoma.  At that time she had a 10 mm polyp in the cecum that was resected as well as a 4 mm polyp in the ascending colon that was resected and a 5 mm polyp in the hepatic flexure that was resected.  She was found to have a 25 to 30 mm polyp in the proximal transverse colon that was sessile and laterally spreading involving at least one third of the luminal circumference.  Biopsies were obtained and a adenoma was found.  She also has diverticulosis as well as internal hemorrhoids.  It is for this reason today that the patient is referred for consideration of discussion for advanced polyp resection.  She is otherwise not having any significant changes in her normal GI symptoms.  She has not noted any blood in her stool.  She is hopeful that this is something that we can remove.  She has not seen surgery for this evaluation.  GI Review of Systems Positive as above Negative for dysphagia, odynophagia, melena, hematochezia, abdominal pain  Review of Systems General: Denies fevers/chills/weight loss Cardiovascular: Denies chest pain Pulmonary: Has baseline wheezing and at  times shortness of breath Gastroenterological: See HPI Genitourinary: Denies darkened urine Hematological: Denies easy bruising/bleeding Dermatological: Denies jaundice Psychological: Mood is mildly anxious about the consideration of another colonoscopy   Medications Current Outpatient Medications  Medication Sig Dispense Refill  . acetaminophen (TYLENOL) 650 MG CR tablet Take 650 mg by mouth every 8 (eight) hours as needed for pain. Reported on 02/08/2015    . albuterol (PROVENTIL HFA;VENTOLIN HFA) 108 (90 Base) MCG/ACT inhaler Inhale 2 puffs into the lungs every 6 (six) hours as needed for wheezing or shortness of breath. 1 Inhaler 5  . citalopram (CELEXA) 10 MG tablet Take 1 tablet (10 mg total) by mouth daily. 90 tablet 1  . OVER THE COUNTER MEDICATION Calcium and vitamin D, 600 mg. Three capsules daily.    Marland Kitchen tiZANidine (ZANAFLEX) 4 MG capsule Take 1 capsule (4 mg total) by mouth 3 (three) times daily. As needed 30 capsule 0  . polyethylene glycol-electrolytes (NULYTELY/GOLYTELY) 420 g solution Take 4,000 mLs by mouth as directed. 4000 mL 0   No current facility-administered medications for this visit.     Allergies No Known Allergies  Histories Past Medical History:  Diagnosis Date  . Allergy   . Anxiety   . Arthritis    degenerative back, joint disturbance "everywhere "  . Asthma   . Colon polyps   . COPD (chronic obstructive pulmonary disease) ([place])   . Genital warts   . Osteoporosis   . Shortness of breath    pt./ reports that she is out of shape  Past Surgical History:  Procedure Laterality Date  . ANTERIOR FUSION LUMBAR SPINE  05/06/2012  . ANTERIOR LAT LUMBAR FUSION N/A 05/06/2012   Procedure: ANTERIOR LATERAL LUMBAR FUSION 3 LEVELS;  Surgeon: Sinclair Ship, MD;  Location: [place];  Service: Orthopedics;  Laterality: N/A;  Lateral interbody fusion, lumbar 2-3,lumbar 3-4, lumbar 4-5 with instrumentation and allograft.  Marland Kitchen KNEE ARTHROSCOPY      R knee 01/2012,  for ligament tears  . TONSILLECTOMY    . VAGINAL DELIVERY     x1   Social History   Socioeconomic History  . Marital status: Single    Spouse name: Not on file  . Number of children: 1  . Years of education: Not on file  . Highest education level: Not on file  Occupational History  . 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 Financial resource strain: Not on file  . Food insecurity:    Worry: Not on file    Inability: Not on file  . Transportation needs:    Medical: Not on file    Non-medical: Not on file  Tobacco Use  . Smoking status: Current Every Day Smoker    Packs/day: 1.00    Years: 55.00    Pack years: 55.00    Types: Cigarettes    Last attempt to quit: 10/10/2014    Years since quitting: 3.1  . Smokeless tobacco: Never Used  . Tobacco comment: Down to 1 cigarette daily  Substance and Sexual Activity  . Alcohol use: Yes    Alcohol/week: 0.0 standard drinks    Comment: 2 times per month  . Drug use: No  . Sexual activity: Not Currently    Partners: Male  Lifestyle  . Physical activity:    Days per week: Not on file    Minutes per session: Not on file  . Stress: Not on file  Relationships  . Social connections:    Talks on phone: Not on file    Gets together: Not on file    Attends religious service: Not on file    Active member of club or organization: Not on file    Attends meetings of clubs or organizations: Not on file    Relationship status: Not on file  . Intimate partner violence:    Fear of current or ex partner: Not on file    Emotionally abused: Not on file    Physically abused: Not on file    Forced sexual activity: Not on file  Other Topics Concern  . Not on file  Social History Narrative   1 caffeine drinks daily   No exercise   Family History  Problem Relation Age of Onset  . Arthritis Mother   . Arthritis Father   . Stroke Father   . Colon cancer Father 73  . Heart disease Father 1        chf  . Breast cancer Maternal Grandmother   . Asthma Maternal Grandmother   . Asthma Daughter   . Esophageal cancer Neg Hx   . Ulcerative colitis Neg Hx   . Stomach cancer Neg Hx    I have reviewed her medical, social, and family history in detail and updated the electronic medical record as necessary.    PHYSICAL EXAMINATION  BP 140/70   Pulse 82   Ht 5\' 5"  (1.651 m)   Wt 179 lb (81.2 kg)   BMI 29.79 kg/m  Wt Readings from  Last 3 Encounters:  11/14/17 179 lb (81.2 kg)  09/29/17 179 lb (81.2 kg)  09/22/17 179 lb (81.2 kg)  GEN: NAD, appears stated age, doesn't appear chronically ill PSYCH: Cooperative, without pressured speech EYE: Conjunctivae pink, sclerae anicteric ENT: MMM, without oral ulcers, no erythema or exudates noted NECK: Supple CV: RR without R/Gs  RESP: Wheezing appreciated in the bilateral lung bases GI: NABS, soft, NT/ND, without rebound or guarding, no HSM appreciated MSK/EXT: No lower extremity edema SKIN: No jaundice NEURO:  Alert & Oriented x 3, no focal deficits   REVIEW OF DATA  I reviewed the following data at the time of this encounter:  GI Procedures and Studies  9/19 Colonoscopy - One 10 mm polyp in the cecum, removed with a hot snare. Resected and retrieved. - One 4 mm polyp in the ascending colon, removed with a cold snare. Resected and retrieved. - One 5 mm polyp at the hepatic flexure, removed with a cold snare. Resected and retrieved. - One 25-30 mm polyp in the proximal transverse colon. Biopsied. - Diverticulosis in the sigmoid colon and in the descending colon. - Internal hemorrhoids.  Laboratory Studies  Reviewed in epic  Imaging Studies  No relevant studies to review   ASSESSMENT  Ms. Savino is a 67 y.o. female with a pmh significant for COPD/Asthma, Anxiety, Osteoporosis, OA, Colon Polyps.  The patient is seen today for evaluation and management of:  1. History of colonic polyps   2. Tubular adenoma of colon    This is a  clinically and hemodynamically stable patient was found to have a large transverse colon polyp that returned as tubular adenoma.  Looking at the images it does look to be approximately 25 to 30 mm on current forward-viewing exam.  It does involve a significant portion of the wall of the colon.  Based upon the description and endoscopic pictures I do feel that it is reasonable to pursue an Advanced Polypectomy attempt of the polyp/lesion.  We discussed some of the techniques of advanced polypectomy which include Endoscopic Mucosal Resection, OVESCO Full-Thickness Resection, Endorotor Morcellation, and Tissue Ablation via Fulguration.  The risks and benefits of endoscopic evaluation were discussed with the patient; these include but are not limited to the risk of perforation, infection, bleeding, missed lesions, lack of diagnosis, severe illness requiring hospitalization, as well as anesthesia and sedation related illnesses.  During attempts at advanced polypectomy, the risks of bleeding and perforation/leak are increased as opposed to diagnostic and screening colonoscopies, and that was discussed with the patient as well.  I did offer, a referral to surgery as well to discuss consideration of a surgical intervention for prior to finalizing decision for attempt at endoscopic removal; the patient has deferred on this.  If, after attempt at removal of the polyp, it is found that the patient has a complication or that an invasive lesion or malignant lesion is found, or that the polyp continues to recur, the patient is aware and understands that a surgery may still be indicated/required.  In addition, with the possible need for piecemeal resection, subsequent short-interval colonoscopies for follow up and treatment of the lesion may be necessary and was also explained.  All patient questions were answered, to the best of my ability, and the patient agrees to the aforementioned plan of action with follow-up as  indicated.   PLAN  1. Tubular adenoma of colon - Colonoscopy with EMR 90 minute case - CBC; Future - Basic metabolic panel; Future - Protime-INR; Future  2. History of colonic polyps   Orders Placed This Encounter  Procedures  . CBC  . Basic metabolic panel  . Protime-INR  . Ambulatory referral to Gastroenterology    New Prescriptions   POLYETHYLENE GLYCOL-ELECTROLYTES (NULYTELY/GOLYTELY) 420 G SOLUTION    Take 4,000 mLs by mouth as directed.   Modified Medications   No medications on file    Planned Follow Up: No follow-ups on file.   Justice Britain, MD [facility] Advanced Endoscopy Office # [phone]

## 2017-11-14 NOTE — Patient Instructions (Signed)
You have been scheduled for a colonoscopy. Please follow written instructions given to you at your visit today.  Please pick up your prep supplies at the pharmacy within the next 1-3 days. If you use inhalers (even only as needed), please bring them with you on the day of your procedure. Your physician has requested that you go to www.startemmi.com and enter the access code given to you at your visit today. This web site gives a general overview about your procedure. However, you should still follow specific instructions given to you by our office regarding your preparation for the procedure.  Your provider has requested that you go to the basement level for lab work before leaving today. Press "B" on the elevator. The lab is located at the first door on the left as you exit the elevator.  Thank you for entrusting me with your care and choosing [facility] care.  Dr Rush Landmark

## 2017-11-18 DIAGNOSIS — D126 Benign neoplasm of colon, unspecified: Secondary | ICD-10-CM | POA: Insufficient documentation

## 2017-11-18 HISTORY — DX: Benign neoplasm of colon, unspecified: D12.6

## 2017-12-03 ENCOUNTER — Encounter: Payer: Self-pay | Admitting: Family Medicine

## 2017-12-03 ENCOUNTER — Ambulatory Visit (INDEPENDENT_AMBULATORY_CARE_PROVIDER_SITE_OTHER): Payer: Medicare HMO | Admitting: Family Medicine

## 2017-12-03 VITALS — BP 130/72 | HR 70 | Temp 99.7°F | Ht 65.0 in | Wt 178.2 lb

## 2017-12-03 DIAGNOSIS — J441 Chronic obstructive pulmonary disease with (acute) exacerbation: Secondary | ICD-10-CM

## 2017-12-03 MED ORDER — METHYLPREDNISOLONE ACETATE 80 MG/ML IJ SUSP
80.0000 mg | Freq: Once | INTRAMUSCULAR | Status: AC
  Administered 2017-12-03: 80 mg via INTRAMUSCULAR

## 2017-12-03 MED ORDER — AZITHROMYCIN 250 MG PO TABS: ORAL_TABLET | ORAL | 0 refills | Status: DC

## 2017-12-03 NOTE — Progress Notes (Signed)
Pre visit review using our clinic review tool, if applicable. No additional management support is needed unless otherwise documented below in the visit note. 

## 2017-12-03 NOTE — Progress Notes (Signed)
Chief Complaint  Patient presents with  . Nasal Congestion    chest congestion  . Ear Pain    [place] here for URI complaints.  Duration: 4 days  Associated symptoms: Fever (100.1 F), sinus congestion, sinus pain, rhinorrhea, ear pain, wheezing, shortness of breath, chest tightness and fevers Denies: itchy watery eyes, ear drainage and sore throat Treatment to date: OTC cough meds Sick contacts: No  ROS:  Const: +fevers HEENT: As noted in HPI Lungs: +SOB  Past Medical History:  Diagnosis Date  . Allergy   . Anxiety   . Arthritis    degenerative back, joint disturbance "everywhere "  . Asthma   . Colon polyps   . COPD (chronic obstructive pulmonary disease) ([place])   . Genital warts   . Osteoporosis   . Shortness of breath    pt./ reports that she is out of shape    BP 130/72 (BP Location: Left Arm, Patient Position: Sitting, Cuff Size: Normal)   Pulse 70   Temp 99.7 F (37.6 C) (Oral)   Ht 5\' 5"  (1.651 m)   Wt 178 lb 4 oz (80.9 kg)   SpO2 93%   BMI 29.66 kg/m  General: Awake, alert, appears stated age HEENT: AT, [place], ears patent b/l and TM's neg, nares patent w/o discharge, pharynx pink and without exudates, MMM Neck: No masses or asymmetry Heart: RRR Lungs: Diffuse wheezing, no accessory muscle use Psych: Age appropriate judgment and insight, normal mood and affect  COPD exacerbation (HCC) - Plan: azithromycin (ZITHROMAX) 250 MG tablet  Has AE's from pred. IM Depo today, Zpak if not improving with this.  Continue to push fluids, practice good hand hygiene, cover mouth when coughing. F/u prn. If starting to experience fevers, shaking, or shortness of breath, seek immediate care. Pt voiced understanding and agreement to the plan.  [place], DO 12/03/17 9:31 AM

## 2017-12-03 NOTE — Addendum Note (Signed)
Addended by: Sharon Seller B on: 12/03/2017 10:02 AM   Modules accepted: Orders

## 2017-12-03 NOTE — Patient Instructions (Signed)
Continue to push fluids, practice good hand hygiene, and cover your mouth if you cough.  If you start having fevers, shaking or shortness of breath, seek immediate care.  Don't use the antibiotic (azithromycin) unless you are not getting better in the next 1-2 days.   Make use of your albuterol inhaler if you need it.  Let me know right away if you need a refill of it so we can get it sent in before Thanksgiving.  Let us know if you need anything.

## 2017-12-09 ENCOUNTER — Telehealth: Payer: Self-pay | Admitting: Gastroenterology

## 2017-12-09 NOTE — Telephone Encounter (Signed)
Pt wants to r/s procedure at [facility], She stated that she has been with the flu for the past dew days and does not want to take any chances. Proc is on 12/15/17. Pls call her.

## 2017-12-10 NOTE — Telephone Encounter (Signed)
See MyChart message

## 2017-12-12 ENCOUNTER — Other Ambulatory Visit: Payer: Self-pay

## 2017-12-12 ENCOUNTER — Encounter (HOSPITAL_COMMUNITY): Payer: Self-pay | Admitting: *Deleted

## 2017-12-12 NOTE — Progress Notes (Signed)
Spoke with pt for pre-op call. Pt denies cardiac history, HTN or diabetes.  

## 2017-12-15 ENCOUNTER — Ambulatory Visit (HOSPITAL_COMMUNITY)
Admission: RE | Admit: 2017-12-15 | Discharge: 2017-12-15 | Disposition: A | Discharge status: Home / Self Care | Payer: Medicare HMO | Source: Ambulatory Visit | Attending: Gastroenterology | Admitting: Gastroenterology

## 2017-12-15 ENCOUNTER — Ambulatory Visit (HOSPITAL_COMMUNITY): Payer: Medicare HMO | Admitting: Anesthesiology

## 2017-12-15 ENCOUNTER — Encounter (HOSPITAL_COMMUNITY)
Admission: RE | Disposition: A | Discharge status: Home / Self Care | Payer: Self-pay | Source: Ambulatory Visit | Attending: Gastroenterology

## 2017-12-15 ENCOUNTER — Encounter (HOSPITAL_COMMUNITY): Payer: Self-pay | Admitting: *Deleted

## 2017-12-15 DIAGNOSIS — D123 Benign neoplasm of transverse colon: Secondary | ICD-10-CM

## 2017-12-15 DIAGNOSIS — R69 Illness, unspecified: Secondary | ICD-10-CM | POA: Diagnosis not present

## 2017-12-15 DIAGNOSIS — M810 Age-related osteoporosis without current pathological fracture: Secondary | ICD-10-CM | POA: Diagnosis not present

## 2017-12-15 DIAGNOSIS — K5730 Diverticulosis of large intestine without perforation or abscess without bleeding: Secondary | ICD-10-CM | POA: Diagnosis not present

## 2017-12-15 DIAGNOSIS — K635 Polyp of colon: Secondary | ICD-10-CM | POA: Diagnosis not present

## 2017-12-15 DIAGNOSIS — J449 Chronic obstructive pulmonary disease, unspecified: Secondary | ICD-10-CM | POA: Insufficient documentation

## 2017-12-15 DIAGNOSIS — K641 Second degree hemorrhoids: Secondary | ICD-10-CM

## 2017-12-15 DIAGNOSIS — Z86010 Personal history of colonic polyps: Secondary | ICD-10-CM

## 2017-12-15 DIAGNOSIS — F17210 Nicotine dependence, cigarettes, uncomplicated: Secondary | ICD-10-CM | POA: Diagnosis not present

## 2017-12-15 HISTORY — PX: COLONOSCOPY WITH PROPOFOL: SHX5780

## 2017-12-15 HISTORY — DX: Pneumonia, unspecified organism: J18.9

## 2017-12-15 SURGERY — COLONOSCOPY WITH PROPOFOL
Anesthesia: Monitor Anesthesia Care

## 2017-12-15 MED ORDER — GLYCOPYRROLATE 0.2 MG/ML IJ SOLN
INTRAMUSCULAR | Status: DC | PRN
  Administered 2017-12-15: 0.2 mg via INTRAVENOUS

## 2017-12-15 MED ORDER — PROPOFOL 500 MG/50ML IV EMUL
INTRAVENOUS | Status: DC | PRN
  Administered 2017-12-15: 11:00:00 via INTRAVENOUS
  Administered 2017-12-15: 50 ug/kg/min via INTRAVENOUS

## 2017-12-15 MED ORDER — SODIUM CHLORIDE 0.9 % IV SOLN
INTRAVENOUS | Status: DC | PRN
  Administered 2017-12-15: 25 ug/min via INTRAVENOUS

## 2017-12-15 MED ORDER — ONDANSETRON HCL 4 MG/2ML IJ SOLN
INTRAMUSCULAR | Status: DC | PRN
  Administered 2017-12-15: 4 mg via INTRAVENOUS

## 2017-12-15 MED ORDER — SODIUM CHLORIDE 0.9 % IV SOLN
INTRAVENOUS | Status: DC
  Administered 2017-12-15: 10:00:00 via INTRAVENOUS

## 2017-12-15 MED ORDER — LIDOCAINE 2% (20 MG/ML) 5 ML SYRINGE
INTRAMUSCULAR | Status: DC | PRN
  Administered 2017-12-15: 80 mg via INTRAVENOUS

## 2017-12-15 SURGICAL SUPPLY — 22 items

## 2017-12-15 NOTE — Op Note (Signed)
[facility] Patient Name: Jasmine Mclaughlin Procedure Date : 12/15/2017 MRN: [id number] Attending MD: Justice Britain , MD Date of Birth: 1950-06-03 CSN: [id number] Age: 67 Admit Type: Outpatient Procedure:                Colonoscopy Indications:              Therapeutic procedure, Therapeutic procedure for                            colon polyps Providers:                Justice Britain, MD, Burtis Junes, RN, William Dalton, Technician Referring MD:             Lajuan Lines. Pyrtle, MD Medicines:                Monitored Anesthesia Care Complications:            No immediate complications. Estimated Blood Loss:     Estimated blood loss was minimal. Procedure:                Pre-Anesthesia Assessment:                           - Prior to the procedure, a History and Physical                            was performed, and patient medications and                            allergies were reviewed. The patient's tolerance of                            previous anesthesia was also reviewed. The risks                            and benefits of the procedure and the sedation                            options and risks were discussed with the patient.                            All questions were answered, and informed consent                            was obtained. Prior Anticoagulants: The patient has                            taken no previous anticoagulant or antiplatelet                            agents. ASA Grade Assessment: II - A patient with  mild systemic disease. After reviewing the risks                            and benefits, the patient was deemed in                            satisfactory condition to undergo the procedure.                           After obtaining informed consent, the colonoscope                            was passed under direct vision. Throughout the                            procedure, the  patient's blood pressure, pulse, and                            oxygen saturations were monitored continuously. The                            Colonoscope was introduced through the anus and                            advanced to the the cecum, identified by                            appendiceal orifice and ileocecal valve. The                            colonoscopy was technically difficult and complex.                            Successful completion of the procedure was aided by                            performing the maneuvers documented (below) in this                            report. Scope In: 10:47:23 AM Scope Out: 12:10:45 PM Total Procedure Duration: 1 hour 23 minutes 22 seconds  Findings:      Hemorrhoids were found on perianal exam.      A 35 mm polyp was found at the hepatic flexure - best visualized in       retroflexed positioning. The polyp was sessile. Preparations were made       for mucosal resection. [place] Orise Gel was injected to raise the lesion       and initial placement of Orise in the retroflexed positioning. Piecemeal       mucosal resection using a snare was performed going back and forth from       a retroflexed and straight position. Resection and retrieval were       complete. Coagulation for tissue destruction using the snare tip was       successful to the edge of the entire lesion  to decrease risk of       recurrence. To close the defect after mucosal resection and decrease       risk of post-mucosectomy bleeding, seven hemostatic clips were       successfully placed (MR conditional) and approximated as much of the       polyp as able with the width of the defect. There was no bleeding       during, or at the end, of the procedure.      A single small-mouthed diverticulum was found in the ascending colon.      A few small-mouthed diverticula were found in the sigmoid colon and       descending colon.      The rest of the colon was not visualized  in great detail due to the       intent of this procedure and recent full colonoscopy having been       completed.      Non-bleeding non-thrombosed internal hemorrhoids were found during       retroflexion, during perianal exam and during digital exam. The       hemorrhoids were Grade II (internal hemorrhoids that prolapse but reduce       spontaneously). Impression:               - Hemorrhoids found on perianal exam.                           - One 35 mm polyp at the hepatic flexure, removed                            with piecemeal mucosal resection. Resected and                            retrieved. Treated with a hot snare tip to edge.                            Clips (MR conditional) were placed to close defect                            and decrease risk of bleeding.                           - Diverticula in the ascending colon.                           - Diverticulosis in the sigmoid colon and in the                            descending colon.                           - Non-bleeding non-thrombosed internal hemorrhoids. Recommendation:           - The patient will be observed post-procedure,                            until all discharge criteria are met.                           -  Discharge patient to home.                           - Patient has a contact number available for                            emergencies. The signs and symptoms of potential                            delayed complications were discussed with the                            patient. Return to normal activities tomorrow.                            Written discharge instructions were provided to the                            patient.                           - Full liquid diet initially then soft diet by this                            evening. As long as she is doing well, she may                            advance her diet thereafter.                           - No aspirin, ibuprofen, naproxen, or  other                            non-steroidal anti-inflammatory drugs for at least                            1-2 weeks if able.                           - Await pathology results.                           - The risks of bleeding/perforation/infection were                            once again discussed with the patient and her                            daugther. Small amounts of blood may be noted in                            the first 1-2 days however, significantly increased                            number of bowel movements  or overt bleeding should                            not be expected, but if noticed they know to call                            our office during day or in evenings call coverage                            and proceed to ED for further evaluation if                            necessary.                           - Repeat colonoscopy in 4-6 months for surveillance                            and will be scheduled as a Colonoscopy with EMR and                            have Endorotor available if any recurrent polypoid                            tissue is present.                           - The findings and recommendations were discussed                            with the patient.                           - The findings and recommendations were discussed                            with the patient's family. Procedure Code(s):        --- Professional ---                           [phone], Colonoscopy, flexible; with endoscopic                            mucosal resection Diagnosis Code(s):        --- Professional ---                           K64.1, Second degree hemorrhoids                           D12.3, Benign neoplasm of transverse colon (hepatic                            flexure or splenic flexure)  K63.5, Polyp of colon CPT copyright 2018 American Medical Association. All rights reserved. The codes documented in this report  are preliminary and upon coder review may  be revised to meet current compliance requirements. Justice Britain, MD 12/15/2017 12:27:23 PM Number of Addenda: 0

## 2017-12-15 NOTE — Anesthesia Procedure Notes (Signed)
Procedure Name: MAC Date/Time: 12/15/2017 10:29 AM Performed by: Lowella Dell, CRNA Pre-anesthesia Checklist: Patient identified, Emergency Drugs available, Suction available, Patient being monitored and Timeout performed Patient Re-evaluated:Patient Re-evaluated prior to induction Oxygen Delivery Method: Nasal cannula Induction Type: IV induction Placement Confirmation: positive ETCO2

## 2017-12-15 NOTE — Anesthesia Postprocedure Evaluation (Signed)
Anesthesia Post Note  Patient: Jasmine Mclaughlin  Procedure(s) Performed: COLONOSCOPY WITH PROPOFOL (N/A ) ENDOSCOPIC MUCOSAL RESECTION (N/A ) POLYPECTOMY SUBMUCOSAL LIFTING INJECTION orise HEMOSTASIS CLIP PLACEMENT     Patient location during evaluation: PACU Anesthesia Type: MAC Level of consciousness: awake and alert Pain management: pain level controlled Vital Signs Assessment: post-procedure vital signs reviewed and stable Respiratory status: spontaneous breathing, nonlabored ventilation, respiratory function stable and patient connected to nasal cannula oxygen Cardiovascular status: stable and blood pressure returned to baseline Postop Assessment: no apparent nausea or vomiting Anesthetic complications: no    Last Vitals:  Vitals:   12/15/17 1230 12/15/17 1240  BP: (!) 161/64 118/63  Pulse: (!) 47 (!) 44  Resp: 19 19  Temp:    SpO2: 99% 98%    Last Pain:  Vitals:   12/15/17 1240  TempSrc:   PainSc: 0-No pain                 Tiajuana Amass

## 2017-12-15 NOTE — Transfer of Care (Signed)
Immediate Anesthesia Transfer of Care Note  Patient: Jasmine Mclaughlin  Procedure(s) Performed: COLONOSCOPY WITH PROPOFOL (N/A ) ENDOSCOPIC MUCOSAL RESECTION (N/A ) POLYPECTOMY SUBMUCOSAL LIFTING INJECTION orise HEMOSTASIS CLIP PLACEMENT  Patient Location: PACU and Endoscopy Unit  Anesthesia Type:MAC  Level of Consciousness: drowsy and patient cooperative  Airway & Oxygen Therapy: Patient Spontanous Breathing and Patient connected to nasal cannula oxygen  Post-op Assessment: Report given to RN and Post -op Vital signs reviewed and stable  Post vital signs: Reviewed and stable  Last Vitals:  Vitals Value Taken Time  BP    Temp    Pulse    Resp    SpO2      Last Pain:  Vitals:   12/15/17 0903  TempSrc: Oral  PainSc: 0-No pain         Complications: No apparent anesthesia complications

## 2017-12-15 NOTE — Discharge Instructions (Signed)
YOU HAD AN ENDOSCOPIC PROCEDURE TODAY: Refer to the procedure report and other information in the discharge instructions given to you for any specific questions about what was found during the examination. If this information does not answer your questions, please call [facility] office at [phone] to clarify.   YOU SHOULD EXPECT: Some feelings of bloating in the abdomen. Passage of more gas than usual. Walking can help get rid of the air that was put into your GI tract during the procedure and reduce the bloating. If you had a lower endoscopy (such as a colonoscopy or flexible sigmoidoscopy) you may notice spotting of blood in your stool or on the toilet paper. Some abdominal soreness may be present for a day or two, also.  DIET: Your first meal following the procedure should be a light meal and then it is ok to progress to your normal diet. A half-sandwich or bowl of soup is an example of a good first meal. Heavy or fried foods are harder to digest and may make you feel nauseous or bloated. Drink plenty of fluids but you should avoid alcoholic beverages for 24 hours. If you had a esophageal dilation, please see attached instructions for diet.    ACTIVITY: Your care partner should take you home directly after the procedure. You should plan to take it easy, moving slowly for the rest of the day. You can resume normal activity the day after the procedure however YOU SHOULD NOT DRIVE, use power tools, machinery or perform tasks that involve climbing or major physical exertion for 24 hours (because of the sedation medicines used during the test).   SYMPTOMS TO REPORT IMMEDIATELY: A gastroenterologist can be reached at any hour. Please call [phone]  for any of the following symptoms:  Following lower endoscopy (colonoscopy, flexible sigmoidoscopy) Excessive amounts of blood in the stool  Significant tenderness, worsening of abdominal pains  Swelling of the abdomen that is new, acute  Fever of 100 or  higher  Following upper endoscopy (EGD, EUS, ERCP, esophageal dilation) Vomiting of blood or coffee ground material  New, significant abdominal pain  New, significant chest pain or pain under the shoulder blades  Painful or persistently difficult swallowing  New shortness of breath  Black, tarry-looking or red, bloody stools  FOLLOW UP:  If any biopsies were taken you will be contacted by phone or by letter within the next 1-3 weeks. Call [phone]  if you have not heard about the biopsies in 3 weeks.  Please also call with any specific questions about appointments or follow up tests. Colonoscopy, Adult, Care After This sheet gives you information about how to care for yourself after your procedure. Your health care provider may also give you more specific instructions. If you have problems or questions, contact your health care provider. What can I expect after the procedure? After the procedure, it is common to have:  A small amount of blood in your stool for 24 hours after the procedure.  Some gas.  Mild abdominal cramping or bloating.  Follow these instructions at home: General instructions   For the first 24 hours after the procedure: ? Do not drive or use machinery. ? Do not sign important documents. ? Do not drink alcohol. ? Do your regular daily activities at a slower pace than normal. ? Eat soft, easy-to-digest foods. ? Rest often.  Take over-the-counter or prescription medicines only as told by your health care provider.  It is up to you to get the results of your  procedure. Ask your health care provider, or the department performing the procedure, when your results will be ready. Relieving cramping and bloating  Try walking around when you have cramps or feel bloated.  Apply heat to your abdomen as told by your health care provider. Use a heat source that your health care provider recommends, such as a moist heat pack or a heating pad. ? Place a towel between  your skin and the heat source. ? Leave the heat on for 20-30 minutes. ? Remove the heat if your skin turns bright red. This is especially important if you are unable to feel pain, heat, or cold. You may have a greater risk of getting burned. Eating and drinking  Drink enough fluid to keep your urine clear or pale yellow.  Resume your normal diet as instructed by your health care provider. Avoid heavy or fried foods that are hard to digest.  Avoid drinking alcohol for as long as instructed by your health care provider. Contact a health care provider if:  You have blood in your stool 2-3 days after the procedure. Get help right away if:  You have more than a small spotting of blood in your stool.  You pass large blood clots in your stool.  Your abdomen is swollen.  You have nausea or vomiting.  You have a fever.  You have increasing abdominal pain that is not relieved with medicine. This information is not intended to replace advice given to you by your health care provider. Make sure you discuss any questions you have with your health care provider. Document Released: 08/08/2003 Document Revised: 09/18/2015 Document Reviewed: 03/07/2015 Elsevier Interactive Patient Education  [employer].

## 2017-12-15 NOTE — H&P (Signed)
GASTROENTEROLOGY OUTPATIENT PROCEDURE H&P NOTE   Primary Care Physician: Carollee Herter, Alferd Apa, DO  HPI: Jasmine Mclaughlin is a 67 y.o. female who presents for Colonoscopy with EMR attempt  Past Medical History:  Diagnosis Date  . Allergy   . Anxiety   . Arthritis    degenerative back, joint disturbance "everywhere "  . Asthma   . Colon polyps   . COPD (chronic obstructive pulmonary disease) ([place])   . Genital warts   . Osteoporosis   . Pneumonia   . Shortness of breath    pt./ reports that she is out of shape   Past Surgical History:  Procedure Laterality Date  . ANTERIOR FUSION LUMBAR SPINE  05/06/2012  . ANTERIOR LAT LUMBAR FUSION N/A 05/06/2012   Procedure: ANTERIOR LATERAL LUMBAR FUSION 3 LEVELS;  Surgeon: Sinclair Ship, MD;  Location: [facility];  Service: Orthopedics;  Laterality: N/A;  Lateral interbody fusion, lumbar 2-3,lumbar 3-4, lumbar 4-5 with instrumentation and allograft.  . COLONOSCOPY    . KNEE ARTHROSCOPY      R knee 01/2012, for ligament tears  . TONSILLECTOMY    . VAGINAL DELIVERY     x1   Current Facility-Administered Medications  Medication Dose Route Frequency Provider Last Rate Last Dose  . 0.9 %  sodium chloride infusion   Intravenous Continuous Mansouraty, Telford Nab., MD       No Known Allergies Family History  Problem Relation Age of Onset  . Arthritis Mother   . Arthritis Father   . Stroke Father   . Colon cancer Father 68  . Heart disease Father 19       chf  . Breast cancer Maternal Grandmother   . Asthma Maternal Grandmother   . Asthma Daughter   . Esophageal cancer Neg Hx   . Ulcerative colitis Neg Hx   . Stomach cancer Neg Hx    Social History   Socioeconomic History  . Marital status: Single    Spouse name: Not on file  . Number of children: 1  . Years of education: Not on file  . Highest education level: Not on file  Occupational History  . 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 Financial resource strain: Not on file  . Food insecurity:    Worry: Not on file    Inability: Not on file  . Transportation needs:    Medical: Not on file    Non-medical: Not on file  Tobacco Use  . Smoking status: Current Every Day Smoker    Packs/day: 1.00    Years: 55.00    Pack years: 55.00    Types: Cigarettes    Last attempt to quit: 10/10/2014    Years since quitting: 3.1  . Smokeless tobacco: Never Used  . Tobacco comment: Down to 1 cigarette daily  Substance and Sexual Activity  . Alcohol use: Yes    Alcohol/week: 0.0 standard drinks    Comment: 2 times per month  . Drug use: No  . Sexual activity: Not Currently    Partners: Male  Lifestyle  . Physical activity:    Days per week: Not on file    Minutes per session: Not on file  . Stress: Not on file  Relationships  . Social connections:    Talks on phone: Not on file    Gets together: Not on file    Attends religious service: Not on file  Active member of club or organization: Not on file    Attends meetings of clubs or organizations: Not on file    Relationship status: Not on file  . Intimate partner violence:    Fear of current or ex partner: Not on file    Emotionally abused: Not on file    Physically abused: Not on file    Forced sexual activity: Not on file  Other Topics Concern  . Not on file  Social History Narrative   1 caffeine drinks daily   No exercise    Physical Exam: Vital signs in last 24 hours: Temp:  [98.6 F (37 C)] 98.6 F (37 C) (12/09 0903) Pulse Rate:  [54] 54 (12/09 0903) Resp:  [13] 13 (12/09 0903) BP: (161)/(58) 161/58 (12/09 0903) SpO2:  [97 %] 97 % (12/09 0903)   GEN: NAD EYE: Sclerae anicteric ENT: MMM CV: RR without R/Gs  RESP: CTAB posteriorly GI: Soft, NT/ND NEURO:  Alert & Oriented x 3  Lab Results: No results for input(s): WBC, HGB, HCT, PLT in the last 72 hours. BMET No results for input(s): NA, K, CL, CO2, GLUCOSE, BUN, CREATININE, CALCIUM in  the last 72 hours. LFT No results for input(s): PROT, ALBUMIN, AST, ALT, ALKPHOS, BILITOT, BILIDIR, IBILI in the last 72 hours. PT/INR No results for input(s): LABPROT, INR in the last 72 hours.   Impression / Plan: This is a 67 y.o.female who presents for Colonoscopy with EMR attempt.   The risks and benefits of endoscopic evaluation were discussed with the patient; these include but are not limited to the risk of perforation, infection, bleeding, missed lesions, lack of diagnosis, severe illness requiring hospitalization, as well as anesthesia and sedation related illnesses.  The patient is agreeable to proceed.    Justice Britain, MD [facility] Advanced Endoscopy Office # [phone]

## 2017-12-15 NOTE — Anesthesia Preprocedure Evaluation (Signed)
Anesthesia Evaluation  Patient identified by MRN, date of birth, ID band Patient awake    Reviewed: Allergy & Precautions, NPO status , Patient's Chart, lab work & pertinent test results  Airway Mallampati: II  TM Distance: >3 FB Neck ROM: Full    Dental  (+) Dental Advisory Given   Pulmonary asthma , COPD, Current Smoker,    breath sounds clear to auscultation       Cardiovascular negative cardio ROS   Rhythm:Regular Rate:Normal     Neuro/Psych Anxiety negative neurological ROS     GI/Hepatic negative GI ROS, Neg liver ROS,   Endo/Other  negative endocrine ROS  Renal/GU negative Renal ROS     Musculoskeletal  (+) Arthritis ,   Abdominal   Peds  Hematology negative hematology ROS (+)   Anesthesia Other Findings   Reproductive/Obstetrics                             Anesthesia Physical Anesthesia Plan  ASA: II  Anesthesia Plan: MAC   Post-op Pain Management:    Induction:   PONV Risk Score and Plan: 1 and Ondansetron and Treatment may vary due to age or medical condition  Airway Management Planned: Natural Airway and Simple Face Mask  Additional Equipment:   Intra-op Plan:   Post-operative Plan:   Informed Consent: I have reviewed the patients History and Physical, chart, labs and discussed the procedure including the risks, benefits and alternatives for the proposed anesthesia with the patient or authorized representative who has indicated his/her understanding and acceptance.     Plan Discussed with:   Anesthesia Plan Comments:         Anesthesia Quick Evaluation

## 2017-12-16 ENCOUNTER — Encounter: Payer: Self-pay | Admitting: Gastroenterology

## 2018-03-20 ENCOUNTER — Ambulatory Visit (INDEPENDENT_AMBULATORY_CARE_PROVIDER_SITE_OTHER): Payer: Medicare HMO | Admitting: Family Medicine

## 2018-03-20 ENCOUNTER — Encounter: Payer: Self-pay | Admitting: Family Medicine

## 2018-03-20 ENCOUNTER — Other Ambulatory Visit: Payer: Self-pay

## 2018-03-20 VITALS — BP 120/72 | HR 64 | Temp 99.8°F | Ht 65.0 in | Wt 174.2 lb

## 2018-03-20 DIAGNOSIS — J069 Acute upper respiratory infection, unspecified: Secondary | ICD-10-CM

## 2018-03-20 DIAGNOSIS — H1031 Unspecified acute conjunctivitis, right eye: Secondary | ICD-10-CM | POA: Diagnosis not present

## 2018-03-20 MED ORDER — POLYMYXIN B-TRIMETHOPRIM 10000-0.1 UNIT/ML-% OP SOLN: 1.0000 [drp] | OPHTHALMIC | 0 refills | Status: DC

## 2018-03-20 MED ORDER — PREDNISONE 20 MG PO TABS: 40.0000 mg | ORAL_TABLET | Freq: Every day | ORAL | 0 refills | Status: AC

## 2018-03-20 NOTE — Progress Notes (Signed)
Chief Complaint  Patient presents with  . Eye Drainage    [place] here for URI complaints.  Duration: 1 day  Associated symptoms: Fever (100 F), sinus congestion, rhinorrhea, ear fullness, ear pain, sore throat and crusty eye w blurred vision Denies: sinus pain, red eyes, ear drainage, wheezing, shortness of breath and myalgia Treatment to date: None Sick contacts: Yes  ROS:  Const: Denies fevers HEENT: As noted in HPI Lungs: No SOB  Past Medical History:  Diagnosis Date  . Allergy   . Anxiety   . Arthritis    degenerative back, joint disturbance "everywhere "  . Asthma   . Colon polyps   . COPD (chronic obstructive pulmonary disease) ([place])   . Genital warts   . Osteoporosis   . Pneumonia   . Shortness of breath    pt./ reports that she is out of shape    BP 120/72 (BP Location: Left Arm, Patient Position: Sitting, Cuff Size: Normal)   Pulse 64   Temp 99.8 F (37.7 C) (Oral)   Ht 5\' 5"  (1.651 m)   Wt 174 lb 4 oz (79 kg)   SpO2 95%   BMI 29.00 kg/m  General: Awake, alert, appears stated age HEENT: AT, , ears patent b/l and TM's neg, nares patent w/o discharge, pharynx pink and without exudates, MMM; L eye neg, R eye mildly injected, medial purulent discharge noted Neck: No masses or asymmetry Heart: RRR Lungs: CTAB, no accessory muscle use Psych: Age appropriate judgment and insight, normal mood and affect  Acute bacterial conjunctivitis of right eye - Plan: trimethoprim-polymyxin b (POLYTRIM) ophthalmic solution  URI, acute - Plan: predniSONE (DELTASONE) 20 MG tablet  Orders as above. Likely has ETD, will start oral pred.  Continue to push fluids, practice good hand hygiene, cover mouth when coughing. F/u prn. If starting to experience increasing fevers, shaking, or shortness of breath, seek immediate care. Pt voiced understanding and agreement to the plan.  [place], DO 03/20/18 9:30 AM

## 2018-03-20 NOTE — Patient Instructions (Addendum)
Continue to push fluids, practice good hand hygiene, and cover your mouth if you cough.  If you start having increasing fevers, shaking or shortness of breath, seek immediate care.  Artificial tears like Refresh and Systane may be used for comfort. OK to get generic version. Generally people use them every 2-4 hours, but you can use them as much as you want because there is no medication in it.  Let us know if you need anything.

## 2018-03-30 ENCOUNTER — Other Ambulatory Visit: Payer: Self-pay | Admitting: Family Medicine

## 2018-03-30 ENCOUNTER — Encounter: Payer: Self-pay | Admitting: Family Medicine

## 2018-03-30 DIAGNOSIS — F419 Anxiety disorder, unspecified: Secondary | ICD-10-CM

## 2018-03-30 MED ORDER — CITALOPRAM HYDROBROMIDE 10 MG PO TABS: 10.0000 mg | ORAL_TABLET | Freq: Every day | ORAL | 1 refills | Status: DC

## 2018-04-27 ENCOUNTER — Encounter: Payer: Self-pay | Admitting: Family Medicine

## 2018-04-28 ENCOUNTER — Other Ambulatory Visit: Payer: Self-pay | Admitting: Family Medicine

## 2018-04-28 DIAGNOSIS — M62838 Other muscle spasm: Secondary | ICD-10-CM

## 2018-04-28 MED ORDER — TIZANIDINE HCL 4 MG PO CAPS: 4.0000 mg | ORAL_CAPSULE | Freq: Three times a day (TID) | ORAL | 1 refills | Status: DC | PRN

## 2018-06-17 ENCOUNTER — Encounter: Payer: Self-pay | Admitting: Family Medicine

## 2018-06-22 ENCOUNTER — Telehealth: Payer: Self-pay

## 2018-06-22 NOTE — Telephone Encounter (Signed)
-----   Message from Algernon Huxley, RN sent at 06/15/2018  1:24 PM EDT ----- Regarding: Colon with EMR Pt calling to schedule her recall colon with EMR at hospital

## 2018-06-23 ENCOUNTER — Other Ambulatory Visit: Payer: Self-pay

## 2018-06-23 DIAGNOSIS — D123 Benign neoplasm of transverse colon: Secondary | ICD-10-CM

## 2018-06-23 MED ORDER — PEG 3350-KCL-NA BICARB-NACL 420 G PO SOLR: 4000.0000 mL | Freq: Once | ORAL | 0 refills | Status: AC

## 2018-06-23 NOTE — Telephone Encounter (Signed)
COLON scheduled, pt instructed and medications reviewed.  Patient instructions mailed to home.  Patient to call with any questions or concerns.

## 2018-07-03 ENCOUNTER — Other Ambulatory Visit (HOSPITAL_COMMUNITY): Admission: RE | Admit: 2018-07-03 | Payer: Medicare HMO | Source: Ambulatory Visit

## 2018-07-03 NOTE — Progress Notes (Signed)
Jasmine Mclaughlin's  Covid 19 screen changed to Tomorrow 07/04/18.

## 2018-07-04 ENCOUNTER — Other Ambulatory Visit (HOSPITAL_COMMUNITY)
Admission: RE | Admit: 2018-07-04 | Discharge: 2018-07-04 | Disposition: A | Discharge status: Home / Self Care | Payer: Medicare HMO | Source: Ambulatory Visit | Attending: Gastroenterology | Admitting: Gastroenterology

## 2018-07-04 DIAGNOSIS — Z1159 Encounter for screening for other viral diseases: Secondary | ICD-10-CM | POA: Insufficient documentation

## 2018-07-04 LAB — SARS CORONAVIRUS 2 (TAT 6-24 HRS): SARS Coronavirus 2: NEGATIVE

## 2018-07-07 ENCOUNTER — Encounter (HOSPITAL_COMMUNITY): Payer: Self-pay | Admitting: *Deleted

## 2018-07-07 ENCOUNTER — Other Ambulatory Visit: Payer: Self-pay

## 2018-07-08 ENCOUNTER — Ambulatory Visit (HOSPITAL_COMMUNITY)
Admission: RE | Admit: 2018-07-08 | Discharge: 2018-07-08 | Disposition: A | Discharge status: Home / Self Care | Payer: Medicare HMO | Attending: Gastroenterology | Admitting: Gastroenterology

## 2018-07-08 ENCOUNTER — Ambulatory Visit (HOSPITAL_COMMUNITY): Payer: Medicare HMO | Admitting: Certified Registered"

## 2018-07-08 ENCOUNTER — Encounter (HOSPITAL_COMMUNITY): Payer: Self-pay | Admitting: Certified Registered"

## 2018-07-08 ENCOUNTER — Encounter (HOSPITAL_COMMUNITY)
Admission: RE | Disposition: A | Discharge status: Home / Self Care | Payer: Self-pay | Source: Home / Self Care | Attending: Gastroenterology

## 2018-07-08 DIAGNOSIS — Z86010 Personal history of colonic polyps: Secondary | ICD-10-CM | POA: Insufficient documentation

## 2018-07-08 DIAGNOSIS — K644 Residual hemorrhoidal skin tags: Secondary | ICD-10-CM | POA: Insufficient documentation

## 2018-07-08 DIAGNOSIS — K5790 Diverticulosis of intestine, part unspecified, without perforation or abscess without bleeding: Secondary | ICD-10-CM | POA: Diagnosis not present

## 2018-07-08 DIAGNOSIS — F17210 Nicotine dependence, cigarettes, uncomplicated: Secondary | ICD-10-CM | POA: Diagnosis not present

## 2018-07-08 DIAGNOSIS — Z09 Encounter for follow-up examination after completed treatment for conditions other than malignant neoplasm: Secondary | ICD-10-CM | POA: Insufficient documentation

## 2018-07-08 DIAGNOSIS — K641 Second degree hemorrhoids: Secondary | ICD-10-CM | POA: Insufficient documentation

## 2018-07-08 DIAGNOSIS — D123 Benign neoplasm of transverse colon: Secondary | ICD-10-CM | POA: Diagnosis not present

## 2018-07-08 DIAGNOSIS — K635 Polyp of colon: Secondary | ICD-10-CM | POA: Insufficient documentation

## 2018-07-08 DIAGNOSIS — R69 Illness, unspecified: Secondary | ICD-10-CM | POA: Diagnosis not present

## 2018-07-08 DIAGNOSIS — K5730 Diverticulosis of large intestine without perforation or abscess without bleeding: Secondary | ICD-10-CM | POA: Diagnosis not present

## 2018-07-08 DIAGNOSIS — J449 Chronic obstructive pulmonary disease, unspecified: Secondary | ICD-10-CM | POA: Diagnosis not present

## 2018-07-08 HISTORY — PX: POLYPECTOMY: SHX5525

## 2018-07-08 HISTORY — PX: COLONOSCOPY WITH PROPOFOL: SHX5780

## 2018-07-08 SURGERY — COLONOSCOPY WITH PROPOFOL
Anesthesia: Monitor Anesthesia Care

## 2018-07-08 MED ORDER — LACTATED RINGERS IV SOLN
INTRAVENOUS | Status: DC
  Administered 2018-07-08: 09:00:00 via INTRAVENOUS

## 2018-07-08 MED ORDER — PROPOFOL 500 MG/50ML IV EMUL
INTRAVENOUS | Status: DC | PRN
  Administered 2018-07-08: 100 ug/kg/min via INTRAVENOUS

## 2018-07-08 MED ORDER — SODIUM CHLORIDE 0.9 % IV SOLN: INTRAVENOUS | Status: DC

## 2018-07-08 MED ORDER — PROPOFOL 10 MG/ML IV BOLUS
INTRAVENOUS | Status: DC | PRN
  Administered 2018-07-08 (×2): 20 mg via INTRAVENOUS

## 2018-07-08 MED ORDER — EPINEPHRINE 1 MG/10ML IJ SOSY
PREFILLED_SYRINGE | INTRAMUSCULAR | Status: AC
  Filled 2018-07-08: qty 20

## 2018-07-08 MED ORDER — LIDOCAINE 2% (20 MG/ML) 5 ML SYRINGE
INTRAMUSCULAR | Status: DC | PRN
  Administered 2018-07-08: 40 mg via INTRAVENOUS

## 2018-07-08 SURGICAL SUPPLY — 22 items

## 2018-07-08 NOTE — Transfer of Care (Signed)
Immediate Anesthesia Transfer of Care Note  Patient: Jasmine Mclaughlin  Procedure(s) Performed: COLONOSCOPY WITH PROPOFOL (N/A ) POLYPECTOMY  Patient Location: Endoscopy Unit  Anesthesia Type:MAC  Level of Consciousness: awake, alert  and oriented  Airway & Oxygen Therapy: Patient Spontanous Breathing  Post-op Assessment: Report given to RN  Post vital signs: Reviewed and stable  Last Vitals:  Vitals Value Taken Time  BP    Temp    Pulse 53 07/08/18 1051  Resp 16 07/08/18 1051  SpO2 98 % 07/08/18 1051  Vitals shown include unvalidated device data.  Last Pain:  Vitals:   07/08/18 0905  TempSrc: Oral  PainSc: 0-No pain         Complications: No apparent anesthesia complications

## 2018-07-08 NOTE — Anesthesia Preprocedure Evaluation (Signed)
Anesthesia Evaluation  Patient identified by MRN, date of birth, ID band Patient awake    Reviewed: Allergy & Precautions, NPO status , Patient's Chart, lab work & pertinent test results  Airway Mallampati: II       Dental   Pulmonary pneumonia, COPD, Current Smoker,    breath sounds clear to auscultation       Cardiovascular  Rhythm:Regular Rate:Normal  History noted. CG   Neuro/Psych    GI/Hepatic Neg liver ROS, History noted. CG   Endo/Other  negative endocrine ROS  Renal/GU negative Renal ROS     Musculoskeletal   Abdominal   Peds  Hematology   Anesthesia Other Findings   Reproductive/Obstetrics                             Anesthesia Physical Anesthesia Plan  ASA: III  Anesthesia Plan: MAC   Post-op Pain Management:    Induction: Intravenous  PONV Risk Score and Plan: 1 and Treatment may vary due to age or medical condition  Airway Management Planned: Nasal Cannula and Simple Face Mask  Additional Equipment:   Intra-op Plan:   Post-operative Plan:   Informed Consent: I have reviewed the patients History and Physical, chart, labs and discussed the procedure including the risks, benefits and alternatives for the proposed anesthesia with the patient or authorized representative who has indicated his/her understanding and acceptance.     Dental advisory given  Plan Discussed with: CRNA and Anesthesiologist  Anesthesia Plan Comments:         Anesthesia Quick Evaluation

## 2018-07-08 NOTE — Anesthesia Postprocedure Evaluation (Signed)
Anesthesia Post Note  Patient: Jasmine Mclaughlin  Procedure(s) Performed: COLONOSCOPY WITH PROPOFOL (N/A ) POLYPECTOMY     Patient location during evaluation: Endoscopy Anesthesia Type: MAC Level of consciousness: awake Pain management: pain level controlled Vital Signs Assessment: post-procedure vital signs reviewed and stable Cardiovascular status: stable Postop Assessment: no apparent nausea or vomiting Anesthetic complications: no    Last Vitals:  Vitals:   07/08/18 1103 07/08/18 1113  BP: (!) 123/47 (!) 145/47  Pulse: (!) 46 (!) 43  Resp: (!) 24 17  Temp:    SpO2: 95% 98%    Last Pain:  Vitals:   07/08/18 1113  TempSrc:   PainSc: 0-No pain                 Lorraine Cimmino

## 2018-07-08 NOTE — H&P (Signed)
GASTROENTEROLOGY OUTPATIENT PROCEDURE H&P NOTE   Primary Care Physician: Ann Held, DO  HPI: Jasmine Mclaughlin is a 68 y.o. female who presents for Colonoscopy.  Past Medical History:  Diagnosis Date  . Allergy   . Anxiety   . Arthritis    degenerative back, joint disturbance "everywhere "  . Asthma   . Colon polyps   . COPD (chronic obstructive pulmonary disease) ([place])   . Genital warts   . Osteoporosis   . Pneumonia   . Shortness of breath    pt./ reports that she is out of shape   Past Surgical History:  Procedure Laterality Date  . ANTERIOR FUSION LUMBAR SPINE  05/06/2012  . ANTERIOR LAT LUMBAR FUSION N/A 05/06/2012   Procedure: ANTERIOR LATERAL LUMBAR FUSION 3 LEVELS;  Surgeon: Sinclair Ship, MD;  Location: [facility];  Service: Orthopedics;  Laterality: N/A;  Lateral interbody fusion, lumbar 2-3,lumbar 3-4, lumbar 4-5 with instrumentation and allograft.  . COLONOSCOPY    . COLONOSCOPY WITH PROPOFOL N/A 12/15/2017   Procedure: COLONOSCOPY WITH PROPOFOL;  Surgeon: Rush Landmark Telford Nab., MD;  Location: [facility];  Service: Gastroenterology;  Laterality: N/A;  . KNEE ARTHROSCOPY Right     R knee 01/2012, for ligament tears  . TONSILLECTOMY    . VAGINAL DELIVERY     x1   Current Facility-Administered Medications  Medication Dose Route Frequency Provider Last Rate Last Dose  . lactated ringers infusion   Intravenous Continuous Mansouraty, Telford Nab., MD 20 mL/hr at 07/08/18 0911     No Known Allergies Family History  Problem Relation Age of Onset  . Arthritis Mother   . Arthritis Father   . Stroke Father   . Colon cancer Father 23  . Heart disease Father 45       chf  . Breast cancer Maternal Grandmother   . Asthma Maternal Grandmother   . Asthma Daughter   . Esophageal cancer Neg Hx   . Ulcerative colitis Neg Hx   . Stomach cancer Neg Hx    Social History   Socioeconomic History  . Marital status: Single    Spouse name: Not on file  .  Number of children: 1  . Years of education: Not on file  . Highest education level: Not on file  Occupational History  . 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 Financial resource strain: Not on file  . Food insecurity    Worry: Not on file    Inability: Not on file  . Transportation needs    Medical: Not on file    Non-medical: Not on file  Tobacco Use  . Smoking status: Current Every Day Smoker    Packs/day: 1.00    Years: 55.00    Pack years: 55.00    Types: Cigarettes    Last attempt to quit: 10/10/2014    Years since quitting: 3.7  . Smokeless tobacco: Never Used  . Tobacco comment: Down to 1 cigarette daily  Substance and Sexual Activity  . Alcohol use: Yes    Alcohol/week: 0.0 standard drinks    Comment: "sporatic"- wine, beer, mixed drink  . Drug use: No  . Sexual activity: Not Currently    Partners: Male  Lifestyle  . Physical activity    Days per week: Not on file    Minutes per session: Not on file  . Stress: Not on file  Relationships  . Social connections  Talks on phone: Not on file    Gets together: Not on file    Attends religious service: Not on file    Active member of club or organization: Not on file    Attends meetings of clubs or organizations: Not on file    Relationship status: Not on file  . Intimate partner violence    Fear of current or ex partner: Not on file    Emotionally abused: Not on file    Physically abused: Not on file    Forced sexual activity: Not on file  Other Topics Concern  . Not on file  Social History Narrative   1 caffeine drinks daily   No exercise    Physical Exam: Vital signs in last 24 hours: Temp:  [96.6 F (35.9 C)-97.8 F (36.6 C)] 97.8 F (36.6 C) (07/01 1053) Pulse Rate:  [51-54] 51 (07/01 1053) Resp:  [18-25] 25 (07/01 1053) BP: (121-134)/(42-74) 121/42 (07/01 1053) SpO2:  [97 %] 97 % (07/01 1053) Weight:  [79.4 kg] 79.4 kg (07/01 0905)   GEN: NAD  EYE: Sclerae anicteric ENT: MMM CV: RR without R/Gs  RESP: CTAB posteriorly GI: Soft, NT/ND NEURO:  Alert & Oriented x 3  Lab Results: No results for input(s): WBC, HGB, HCT, PLT in the last 72 hours. BMET No results for input(s): NA, K, CL, CO2, GLUCOSE, BUN, CREATININE, CALCIUM in the last 72 hours. LFT No results for input(s): PROT, ALBUMIN, AST, ALT, ALKPHOS, BILITOT, BILIDIR, IBILI in the last 72 hours. PT/INR No results for input(s): LABPROT, INR in the last 72 hours.   Impression / Plan: This is a 68 y.o.female who presents for Colonoscopy.  The risks and benefits of endoscopic evaluation were discussed with the patient; these include but are not limited to the risk of perforation, infection, bleeding, missed lesions, lack of diagnosis, severe illness requiring hospitalization, as well as anesthesia and sedation related illnesses.  The patient is agreeable to proceed.    Justice Britain, MD [facility] Advanced Endoscopy Office # [phone]

## 2018-07-08 NOTE — Discharge Instructions (Signed)

## 2018-07-08 NOTE — Op Note (Signed)
[facility] Patient Name: Jasmine Mclaughlin Procedure Date : 07/08/2018 MRN: [id number] Attending MD: Justice Britain , MD Date of Birth: 1950/07/14 CSN: [id number] Age: 68 Admit Type: Outpatient Procedure:                Colonoscopy Indications:              Surveillance: Personal history of piecemeal removal                            of adenoma on last colonoscopy 6 months ago Providers:                Justice Britain, MD, Vista Lawman, RN, William Dalton, Technician Referring MD:             Lajuan Lines. Pyrtle, MD Medicines:                Monitored Anesthesia Care Complications:            No immediate complications. Estimated Blood Loss:     Estimated blood loss was minimal. Procedure:                Pre-Anesthesia Assessment:                           - Prior to the procedure, a History and Physical                            was performed, and patient medications and                            allergies were reviewed. The patient's tolerance of                            previous anesthesia was also reviewed. The risks                            and benefits of the procedure and the sedation                            options and risks were discussed with the patient.                            All questions were answered, and informed consent                            was obtained. Prior Anticoagulants: The patient has                            taken no previous anticoagulant or antiplatelet                            agents. ASA Grade Assessment: II - A patient with  mild systemic disease. After reviewing the risks                            and benefits, the patient was deemed in                            satisfactory condition to undergo the procedure.                           After obtaining informed consent, the colonoscope                            was passed under direct vision. Throughout the                   procedure, the patient's blood pressure, pulse, and                            oxygen saturations were monitored continuously. The                            PCF-H190DL ([id number]) Olympus pediatric colonoscope                            was introduced through the anus and advanced to the                            5 cm into the ileum. The colonoscopy was performed                            without difficulty. The patient tolerated the                            procedure. The quality of the bowel preparation was                            good. The terminal ileum, ileocecal valve,                            appendiceal orifice, and rectum were photographed. Scope In: 10:16:04 AM Scope Out: 10:46:35 AM Scope Withdrawal Time: 0 hours 26 minutes 59 seconds  Total Procedure Duration: 0 hours 30 minutes 31 seconds  Findings:      Hemorrhoids were found on perianal exam.      The terminal ileum and ileocecal valve appeared normal.      A medium post mucosectomy scar was found at the hepatic flexure. There       was a 2 mm residual polypoid tissue at the middle of the scar site.       Preparations were made for mucosal resection. Snare mucosal resection       was performed using a cold snare. Resection and retrieval were complete.      A 5 mm polyp was found in the transverse colon. The polyp was sessile.       The polyp was removed with a cold snare. Resection and retrieval were  complete.      Multiple small-mouthed diverticula were found in the recto-sigmoid       colon, sigmoid colon and descending colon.      Non-bleeding non-thrombosed external and internal hemorrhoids were found       during retroflexion, during perianal exam and during digital exam. The       hemorrhoids were Grade II (internal hemorrhoids that prolapse but reduce       spontaneously). Impression:               - Hemorrhoids found on perianal exam.                           - The examined portion  of the ileum was normal.                           - Post mucosectomy scar at the hepatic flexure.                            Small residual polypoid tissue in middle of the                            lesion. Resected via Cold-snare.                           - One 5 mm polyp in the transverse colon, removed                            with a cold snare. Resected and retrieved.                           - Diverticulosis in the recto-sigmoid colon, in the                            sigmoid colon and in the descending colon.                           - Non-bleeding non-thrombosed external and internal                            hemorrhoids. Recommendation:           - The patient will be observed post-procedure,                            until all discharge criteria are met.                           - Discharge patient to home.                           - Patient has a contact number available for                            emergencies. The signs and symptoms of potential  delayed complications were discussed with the                            patient. Return to normal activities tomorrow.                            Written discharge instructions were provided to the                            patient.                           - Resume previous diet.                           - Continue present medications.                           - Await pathology results.                           - Repeat colonoscopy in 1 year for surveillance. If                            no further evidence of polypoid tissue at the                            region then can go to 3-year follow up thereafter.                           - The findings and recommendations were discussed                            with the patient.                           - The findings and recommendations were discussed                            with the patient's family. Procedure Code(s):        ---  Professional ---                           [phone], Colonoscopy, flexible; with endoscopic                            mucosal resection                           [id number], 9, Colonoscopy, flexible; with removal of                            tumor(s), polyp(s), or other lesion(s) by snare                            technique Diagnosis Code(s):        ---  Professional ---                           K64.1, Second degree hemorrhoids                           Z98.890, Other specified postprocedural states                           K63.5, Polyp of colon                           Z09, Encounter for follow-up examination after                            completed treatment for conditions other than                            malignant neoplasm                           Z86.010, Personal history of colonic polyps                           K57.30, Diverticulosis of large intestine without                            perforation or abscess without bleeding CPT copyright 2019 American Medical Association. All rights reserved. The codes documented in this report are preliminary and upon coder review may  be revised to meet current compliance requirements. Justice Britain, MD 07/08/2018 11:12:29 AM Number of Addenda: 0

## 2018-07-08 NOTE — Anesthesia Procedure Notes (Signed)
Procedure Name: MAC Date/Time: 07/08/2018 10:00 AM Performed by: Barrington Ellison, CRNA Pre-anesthesia Checklist: Patient identified, Emergency Drugs available, Suction available and Patient being monitored Patient Re-evaluated:Patient Re-evaluated prior to induction Oxygen Delivery Method: Nasal cannula

## 2018-07-09 ENCOUNTER — Encounter: Payer: Self-pay | Admitting: Gastroenterology

## 2018-07-09 NOTE — Progress Notes (Signed)
Attempted, unable to leave message 

## 2018-07-11 ENCOUNTER — Encounter (HOSPITAL_COMMUNITY): Payer: Self-pay | Admitting: Gastroenterology

## 2018-09-04 ENCOUNTER — Other Ambulatory Visit: Payer: Self-pay | Admitting: Family Medicine

## 2018-09-04 DIAGNOSIS — Z1231 Encounter for screening mammogram for malignant neoplasm of breast: Secondary | ICD-10-CM

## 2018-09-07 NOTE — Progress Notes (Signed)
Virtual Visit via Video Note  I connected with patient on 09/08/18 at  9:00 AM EDT by audio enabled telemedicine application and verified that I am speaking with the correct person using two identifiers.   THIS ENCOUNTER IS A VIRTUAL VISIT DUE TO COVID-19 - PATIENT WAS NOT SEEN IN THE OFFICE. PATIENT HAS CONSENTED TO VIRTUAL VISIT / TELEMEDICINE VISIT   Location of patient: home  Location of provider: office  I discussed the limitations of evaluation and management by telemedicine and the availability of in person appointments. The patient expressed understanding and agreed to proceed.   Subjective:   Jasmine Mclaughlin is a 68 y.o. female who presents for Medicare Annual (Subsequent) preventive examination.  Review of Systems:  Home Safety/Smoke Alarms: Feels safe in home. Smoke alarms in place.  Lives in 1 story home w/ significant other. 5 steps going in with handrail.   Female:        Mammo- scheduled 10/09/18       Dexa scan-  08/21/16. ordered      CCS- 07/08/18. Recall 1 yr.     Objective:     Vitals: Unable to assess. This visit is enabled though telemedicine due to Covid 19.   Advanced Directives 09/08/2018 07/08/2018 12/15/2017 08/11/2017 06/11/2016 05/08/2012 05/07/2012  Does Patient Have a Medical Advance Directive? No No No No No Patient does not have advance directive Patient does not have advance directive  Would patient like information on creating a medical advance directive? No - Patient declined - No - Patient declined No - Patient declined Yes (MAU/Ambulatory/Procedural Areas - Information given) - -  Pre-existing out of facility DNR order (yellow form or pink MOST form) - - - - - - No    Tobacco Social History   Tobacco Use  Smoking Status Current Every Day Smoker  . Packs/day: 1.00  . Years: 55.00  . Pack years: 55.00  . Types: Cigarettes  Smokeless Tobacco Never Used  Tobacco Comment   Down to 1 cigarette daily     Ready to quit: Not Answered Counseling given: Not  Answered Comment: Down to 1 cigarette daily   Clinical Intake: Pain : No/denies pain     Past Medical History:  Diagnosis Date  . Allergy   . Anxiety   . Arthritis    degenerative back, joint disturbance "everywhere "  . Asthma   . Colon polyps   . COPD (chronic obstructive pulmonary disease) ([place])   . Genital warts   . Osteoporosis   . Pneumonia   . Shortness of breath    pt./ reports that she is out of shape   Past Surgical History:  Procedure Laterality Date  . ANTERIOR FUSION LUMBAR SPINE  05/06/2012  . ANTERIOR LAT LUMBAR FUSION N/A 05/06/2012   Procedure: ANTERIOR LATERAL LUMBAR FUSION 3 LEVELS;  Surgeon: Sinclair Ship, MD;  Location: [facility];  Service: Orthopedics;  Laterality: N/A;  Lateral interbody fusion, lumbar 2-3,lumbar 3-4, lumbar 4-5 with instrumentation and allograft.  . COLONOSCOPY    . COLONOSCOPY WITH PROPOFOL N/A 12/15/2017   Procedure: COLONOSCOPY WITH PROPOFOL;  Surgeon: Rush Landmark Telford Nab., MD;  Location: [facility];  Service: Gastroenterology;  Laterality: N/A;  . COLONOSCOPY WITH PROPOFOL N/A 07/08/2018   Procedure: COLONOSCOPY WITH PROPOFOL;  Surgeon: Rush Landmark Telford Nab., MD;  Location: [facility];  Service: Gastroenterology;  Laterality: N/A;  . KNEE ARTHROSCOPY Right     R knee 01/2012, for ligament tears  . POLYPECTOMY  07/08/2018   Procedure:  POLYPECTOMY;  Surgeon: Rush Landmark Telford Nab., MD;  Location: [facility];  Service: Gastroenterology;;  . TONSILLECTOMY    . VAGINAL DELIVERY     x1   Family History  Problem Relation Age of Onset  . Arthritis Mother   . Arthritis Father   . Stroke Father   . Colon cancer Father 18  . Heart disease Father 31       chf  . Breast cancer Maternal Grandmother   . Asthma Maternal Grandmother   . Asthma Daughter   . Esophageal cancer Neg Hx   . Ulcerative colitis Neg Hx   . Stomach cancer Neg Hx    Social History   Socioeconomic History  . Marital status: Single    Spouse name:  Not on file  . Number of children: 1  . Years of education: Not on file  . Highest education level: Not on file  Occupational History  . 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 Financial resource strain: Not on file  . Food insecurity    Worry: Not on file    Inability: Not on file  . Transportation needs    Medical: Not on file    Non-medical: Not on file  Tobacco Use  . Smoking status: Current Every Day Smoker    Packs/day: 1.00    Years: 55.00    Pack years: 55.00    Types: Cigarettes  . Smokeless tobacco: Never Used  . Tobacco comment: Down to 1 cigarette daily  Substance and Sexual Activity  . Alcohol use: Yes    Alcohol/week: 0.0 standard drinks    Comment: "sporatic"- wine, beer, mixed drink  . Drug use: No  . Sexual activity: Not Currently    Partners: Male  Lifestyle  . Physical activity    Days per week: Not on file    Minutes per session: Not on file  . Stress: Not on file  Relationships  . Social Herbalist on phone: Not on file    Gets together: Not on file    Attends religious service: Not on file    Active member of club or organization: Not on file    Attends meetings of clubs or organizations: Not on file    Relationship status: Not on file  Other Topics Concern  . Not on file  Social History Narrative   1 caffeine drinks daily   No exercise    Outpatient Encounter Medications as of 09/08/2018  Medication Sig  . acetaminophen (TYLENOL) 500 MG tablet Take 1,000 mg by mouth 2 (two) times daily as needed for mild pain or headache.   . albuterol (PROVENTIL HFA;VENTOLIN HFA) 108 (90 Base) MCG/ACT inhaler Inhale 2 puffs into the lungs every 6 (six) hours as needed for wheezing or shortness of breath.  . citalopram (CELEXA) 10 MG tablet Take 1 tablet (10 mg total) by mouth at bedtime.  Marland Kitchen tiZANidine (ZANAFLEX) 4 MG capsule Take 1 capsule (4 mg total) by mouth 3 (three) times daily as needed for muscle  spasms.   No facility-administered encounter medications on file as of 09/08/2018.     Activities of Daily Living In your present state of health, do you have any difficulty performing the following activities: 09/08/2018  Hearing? N  Vision? N  Difficulty concentrating or making decisions? N  Walking or climbing stairs? N  Dressing or bathing? N  Doing errands, shopping? N  Conservation officer, nature and  eating ? N  Using the Toilet? N  In the past six months, have you accidently leaked urine? N  Do you have problems with loss of bowel control? N  Managing your Medications? N  Managing your Finances? N  Housekeeping or managing your Housekeeping? N  Some recent data might be hidden    Patient Care Team: Carollee Herter, Alferd Apa, DO as PCP - General (Family Medicine)    Assessment:   This is a routine wellness examination for [place]. Physical assessment deferred to PCP.  Exercise Activities and Dietary recommendations Current Exercise Habits: The patient does not participate in regular exercise at present, Exercise limited by: None identified   Diet (meal preparation, eat out, water intake, caffeinated beverages, dairy products, fruits and vegetables): in general, a "healthy" diet  , well balanced  Goals    . DIET - INCREASE WATER INTAKE     At least 3 glasses per day.        Fall Risk Fall Risk  09/08/2018 06/11/2016  Falls in the past year? 0 No    Depression Screen PHQ 2/9 Scores 09/08/2018 08/11/2017 06/11/2016 10/14/2012  PHQ - 2 Score 1 1 0 0     Cognitive Function Ad8 score reviewed for issues:  Issues making decisions:no  Less interest in hobbies / activities:no  Repeats questions, stories (family complaining):no  Trouble using ordinary gadgets (microwave, computer, phone):no  Forgets the month or year: no  Mismanaging finances: no  Remembering appts:no  Daily problems with thinking and/or memory:no Ad8 score is=0  MMSE - Mini Mental State Exam 06/11/2016  Orientation  to time 5  Orientation to Place 5  Registration 3  Attention/ Calculation 5  Recall 3  Language- name 2 objects 2  Language- repeat 1  Language- follow 3 step command 3  Language- read & follow direction 1  Write a sentence 1  Copy design 1  Total score 30        Immunization History  Administered Date(s) Administered  . Influenza-Unspecified 11/14/2014  . Pneumococcal Conjugate-13 08/29/2017  . Pneumococcal Polysaccharide-23 02/15/2014  . Tdap 08/29/2010  . Zoster 10/14/2012    Screening Tests Health Maintenance  Topic Date Due  . MAMMOGRAM  08/21/2017  . INFLUENZA VACCINE  08/08/2018  . PNA vac Low Risk Adult (2 of 2 - PPSV23) 02/16/2019  . TETANUS/TDAP  08/28/2020  . COLONOSCOPY  07/08/2023  . DEXA SCAN  Completed  . Hepatitis C Screening  Completed       Plan:   See you next year!  Continue to eat heart healthy diet (full of fruits, vegetables, whole grains, lean protein, water--limit salt, fat, and sugar intake) and increase physical activity as tolerated.  Continue doing brain stimulating activities (puzzles, reading, adult coloring books, staying active) to keep memory sharp.   I have ordered you bone density scan. Please schedule.  I have personally reviewed and noted the following in the patient's chart:   . Medical and social history . Use of alcohol, tobacco or illicit drugs  . Current medications and supplements . Functional ability and status . Nutritional status . Physical activity . Advanced directives . List of other physicians . Hospitalizations, surgeries, and ER visits in previous 12 months . Vitals . Screenings to include cognitive, depression, and falls . Referrals and appointments  In addition, I have reviewed and discussed with patient certain preventive protocols, quality metrics, and best practice recommendations. A written personalized care plan for preventive services as well as general preventive  health recommendations were  provided to patient.     Shela Nevin, [place]  09/08/2018

## 2018-09-08 ENCOUNTER — Encounter: Payer: Self-pay | Admitting: *Deleted

## 2018-09-08 ENCOUNTER — Ambulatory Visit (INDEPENDENT_AMBULATORY_CARE_PROVIDER_SITE_OTHER): Payer: Medicare HMO | Admitting: *Deleted

## 2018-09-08 ENCOUNTER — Other Ambulatory Visit: Payer: Self-pay

## 2018-09-08 DIAGNOSIS — Z0000 Encounter for general adult medical examination without abnormal findings: Secondary | ICD-10-CM | POA: Diagnosis not present

## 2018-09-08 DIAGNOSIS — Z780 Asymptomatic menopausal state: Secondary | ICD-10-CM

## 2018-09-08 NOTE — Patient Instructions (Signed)
See you next year!  Continue to eat heart healthy diet (full of fruits, vegetables, whole grains, lean protein, water--limit salt, fat, and sugar intake) and increase physical activity as tolerated.  Continue doing brain stimulating activities (puzzles, reading, adult coloring books, staying active) to keep memory sharp.   I have ordered you bone density scan. Please schedule.   Jasmine Mclaughlin , Thank you for taking time to come for your Medicare Wellness Visit. I appreciate your ongoing commitment to your health goals. Please review the following plan we discussed and let me know if I can assist you in the future.   These are the goals we discussed: Goals    . DIET - INCREASE WATER INTAKE     At least 3 glasses per day.        This is a list of the screening recommended for you and due dates:  Health Maintenance  Topic Date Due  . Mammogram  08/21/2017  . Flu Shot  08/08/2018  . Pneumonia vaccines (2 of 2 - PPSV23) 02/16/2019  . Tetanus Vaccine  08/28/2020  . Colon Cancer Screening  07/08/2023  . DEXA scan (bone density measurement)  Completed  .  Hepatitis C: One time screening is recommended by Center for Disease Control  (CDC) for  adults born from [birth year] through 1964.   Completed    Health Maintenance After Age 25 After age 20, you are at a higher risk for certain long-term diseases and infections as well as injuries from falls. Falls are a major cause of broken bones and head injuries in people who are older than age 5. Getting regular preventive care can help to keep you healthy and well. Preventive care includes getting regular testing and making lifestyle changes as recommended by your health care provider. Talk with your health care provider about:  Which screenings and tests you should have. A screening is a test that checks for a disease when you have no symptoms.  A diet and exercise plan that is right for you. What should I know about screenings and tests to prevent falls?  Screening and testing are the best ways to find a health problem early. Early diagnosis and treatment give you the best chance of managing medical conditions that are common after age 19. Certain conditions and lifestyle choices may make you more likely to have a fall. Your health care provider may recommend:  Regular vision checks. Poor vision and conditions such as cataracts can make you more likely to have a fall. If you wear glasses, make sure to get your prescription updated if your vision changes.  Medicine review. Work with your health care provider to regularly review all of the medicines you are taking, including over-the-counter medicines. Ask your health care provider about any side effects that may make you more likely to have a fall. Tell your health care provider if any medicines that you take make you feel dizzy or sleepy.  Osteoporosis screening. Osteoporosis is a condition that causes the bones to get weaker. This can make the bones weak and cause them to break more easily.  Blood pressure screening. Blood pressure changes and medicines to control blood pressure can make you feel dizzy.  Strength and balance checks. Your health care provider may recommend certain tests to check your strength and balance while standing, walking, or changing positions.  Foot health exam. Foot pain and numbness, as well as not wearing proper footwear, can make you more likely to have a fall.  Depression screening. You may be more likely to have a fall if you have a fear of falling, feel emotionally low, or feel unable to do activities that you used to do.  Alcohol use screening. Using too much alcohol can affect your balance and may make you more likely to have a fall. What actions can I take to lower my risk of falls? General instructions  Talk with your health care provider about your risks for falling. Tell your health care provider if: ? You fall. Be sure to tell your health care provider  about all falls, even ones that seem minor. ? You feel dizzy, sleepy, or off-balance.  Take over-the-counter and prescription medicines only as told by your health care provider. These include any supplements.  Eat a healthy diet and maintain a healthy weight. A healthy diet includes low-fat dairy products, low-fat (lean) meats, and fiber from whole grains, beans, and lots of fruits and vegetables. Home safety  Remove any tripping hazards, such as rugs, cords, and clutter.  Install safety equipment such as grab bars in bathrooms and safety rails on stairs.  Keep rooms and walkways well-lit. Activity   Follow a regular exercise program to stay fit. This will help you maintain your balance. Ask your health care provider what types of exercise are appropriate for you.  If you need a cane or walker, use it as recommended by your health care provider.  Wear supportive shoes that have nonskid soles. Lifestyle  Do not drink alcohol if your health care provider tells you not to drink.  If you drink alcohol, limit how much you have: ? 0-1 drink a day for women. ? 0-2 drinks a day for men.  Be aware of how much alcohol is in your drink. In the [place] one drink equals one typical bottle of beer (12 oz), one-half glass of wine (5 oz), or one shot of hard liquor (1 oz).  Do not use any products that contain nicotine or tobacco, such as cigarettes and e-cigarettes. If you need help quitting, ask your health care provider. Summary  Having a healthy lifestyle and getting preventive care can help to protect your health and wellness after age 14.  Screening and testing are the best way to find a health problem early and help you avoid having a fall. Early diagnosis and treatment give you the best chance for managing medical conditions that are more common for people who are older than age 33.  Falls are a major cause of broken bones and head injuries in people who are older than age 73. Take  precautions to prevent a fall at home.  Work with your health care provider to learn what changes you can make to improve your health and wellness and to prevent falls. This information is not intended to replace advice given to you by your health care provider. Make sure you discuss any questions you have with your health care provider. Document Released: 11/06/2016 Document Revised: 04/16/2018 Document Reviewed: 11/06/2016 Elsevier Patient Education  2020 [employer].

## 2018-09-17 ENCOUNTER — Other Ambulatory Visit: Payer: Self-pay | Admitting: Family Medicine

## 2018-09-17 DIAGNOSIS — F419 Anxiety disorder, unspecified: Secondary | ICD-10-CM

## 2018-09-22 ENCOUNTER — Encounter: Payer: Self-pay | Admitting: Family Medicine

## 2018-09-22 ENCOUNTER — Ambulatory Visit (INDEPENDENT_AMBULATORY_CARE_PROVIDER_SITE_OTHER): Payer: Medicare HMO | Admitting: Family Medicine

## 2018-09-22 ENCOUNTER — Other Ambulatory Visit: Payer: Self-pay

## 2018-09-22 VITALS — BP 110/60 | HR 95 | Temp 97.4°F | Resp 18 | Ht 65.0 in | Wt 175.6 lb

## 2018-09-22 DIAGNOSIS — F419 Anxiety disorder, unspecified: Secondary | ICD-10-CM | POA: Diagnosis not present

## 2018-09-22 DIAGNOSIS — Z23 Encounter for immunization: Secondary | ICD-10-CM | POA: Diagnosis not present

## 2018-09-22 DIAGNOSIS — R69 Illness, unspecified: Secondary | ICD-10-CM | POA: Diagnosis not present

## 2018-09-22 DIAGNOSIS — Z136 Encounter for screening for cardiovascular disorders: Secondary | ICD-10-CM

## 2018-09-22 DIAGNOSIS — M62838 Other muscle spasm: Secondary | ICD-10-CM

## 2018-09-22 DIAGNOSIS — R079 Chest pain, unspecified: Secondary | ICD-10-CM | POA: Diagnosis not present

## 2018-09-22 DIAGNOSIS — Z0000 Encounter for general adult medical examination without abnormal findings: Secondary | ICD-10-CM

## 2018-09-22 DIAGNOSIS — Z0001 Encounter for general adult medical examination with abnormal findings: Secondary | ICD-10-CM

## 2018-09-22 DIAGNOSIS — M159 Polyosteoarthritis, unspecified: Secondary | ICD-10-CM | POA: Diagnosis not present

## 2018-09-22 MED ORDER — CITALOPRAM HYDROBROMIDE 20 MG PO TABS: 20.0000 mg | ORAL_TABLET | Freq: Every day | ORAL | 3 refills | Status: DC

## 2018-09-22 MED ORDER — CELECOXIB 100 MG PO CAPS: 100.0000 mg | ORAL_CAPSULE | Freq: Every day | ORAL | 1 refills | Status: DC

## 2018-09-22 MED ORDER — TIZANIDINE HCL 4 MG PO CAPS: 4.0000 mg | ORAL_CAPSULE | Freq: Three times a day (TID) | ORAL | 1 refills | Status: DC | PRN

## 2018-09-22 NOTE — Progress Notes (Signed)
Subjective:     Jasmine Mclaughlin is a 68 y.o. female and is here for a comprehensive physical exam. The patient reports problems - arthritis pain in mult joints-- she is requesting celebrex. She is also having off and on chest pain that is becoming more frequent.  No sob.  Cp lasts no more than 30 min   No chest pain today.   Social History   Socioeconomic History  . Marital status: Single    Spouse name: Not on file  . Number of children: 1  . Years of education: Not on file  . Highest education level: Not on file  Occupational History  . 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 Financial resource strain: Not on file  . Food insecurity    Worry: Not on file    Inability: Not on file  . Transportation needs    Medical: Not on file    Non-medical: Not on file  Tobacco Use  . Smoking status: Current Every Day Smoker    Packs/day: 1.00    Years: 55.00    Pack years: 55.00    Types: Cigarettes  . Smokeless tobacco: Never Used  . Tobacco comment: Down to 1 cigarette daily  Substance and Sexual Activity  . Alcohol use: Yes    Alcohol/week: 0.0 standard drinks    Comment: "sporatic"- wine, beer, mixed drink  . Drug use: No  . Sexual activity: Not Currently    Partners: Male  Lifestyle  . Physical activity    Days per week: Not on file    Minutes per session: Not on file  . Stress: Not on file  Relationships  . Social Herbalist on phone: Not on file    Gets together: Not on file    Attends religious service: Not on file    Active member of club or organization: Not on file    Attends meetings of clubs or organizations: Not on file    Relationship status: Not on file  . Intimate partner violence    Fear of current or ex partner: Not on file    Emotionally abused: Not on file    Physically abused: Not on file    Forced sexual activity: Not on file  Other Topics Concern  . Not on file  Social History Narrative   1  caffeine drinks daily   No exercise   Health Maintenance  Topic Date Due  . MAMMOGRAM  08/21/2017  . PNA vac Low Risk Adult (2 of 2 - PPSV23) 02/16/2019  . TETANUS/TDAP  08/28/2020  . COLONOSCOPY  07/08/2023  . INFLUENZA VACCINE  Completed  . DEXA SCAN  Completed  . Hepatitis C Screening  Completed    The following portions of the patient's history were reviewed and updated as appropriate:  She  has a past medical history of Allergy, Anxiety, Arthritis, Asthma, Colon polyps, COPD (chronic obstructive pulmonary disease) ([place]), Genital warts, Osteoporosis, Pneumonia, and Shortness of breath. She does not have any pertinent problems on file. She  has a past surgical history that includes Knee arthroscopy (Right); Tonsillectomy; Vaginal delivery; Anterior fusion lumbar spine (05/06/2012); Anterior lat lumbar fusion (N/A, 05/06/2012); Colonoscopy; Colonoscopy with propofol (N/A, 12/15/2017); Colonoscopy with propofol (N/A, 07/08/2018); and polypectomy (07/08/2018). Her family history includes Arthritis in her father and mother; Asthma in her daughter and maternal grandmother; Breast cancer in her maternal grandmother; Colon cancer (age of onset: 24)  in her father; Heart disease (age of onset: 64) in her father; Stroke in her father. She  reports that she has been smoking cigarettes. She has a 55.00 pack-year smoking history. She has never used smokeless tobacco. She reports current alcohol use. She reports that she does not use drugs. She has a current medication list which includes the following prescription(s): acetaminophen, albuterol, tizanidine, celecoxib, and citalopram. Current Outpatient Medications on File Prior to Visit  Medication Sig Dispense Refill  . acetaminophen (TYLENOL) 500 MG tablet Take 1,000 mg by mouth 2 (two) times daily as needed for mild pain or headache.     . albuterol (PROVENTIL HFA;VENTOLIN HFA) 108 (90 Base) MCG/ACT inhaler Inhale 2 puffs into the lungs every 6 (six) hours  as needed for wheezing or shortness of breath. 1 Inhaler 5   No current facility-administered medications on file prior to visit.    She has No Known Allergies..  Review of Systems Review of Systems  Constitutional: Negative for activity change, appetite change and fatigue.  HENT: Negative for hearing loss, congestion, tinnitus and ear discharge.  dentist q77m Eyes: Negative for visual disturbance (see optho q1y -- vision corrected to 20/20 with glasses).  Respiratory: Negative for cough, chest tightness and shortness of breath.   Cardiovascular: Negative for , palpitations and leg swelling.   + chest pain  Gastrointestinal: Negative for abdominal pain, diarrhea, constipation and abdominal distention.  Genitourinary: Negative for urgency, frequency, decreased urine volume and difficulty urinating.  Musculoskeletal: Negative for back pain,  and gait problem.  + arthritis pain Skin: Negative for color change, pallor and rash.  Neurological: Negative for dizziness, light-headedness, numbness and headaches.  Hematological: Negative for adenopathy. Does not bruise/bleed easily.  Psychiatric/Behavioral: Negative for suicidal ideas, confusion, sleep disturbance, self-injury, dysphoric mood, decreased concentration and agitation.       Objective:    BP 110/60 (BP Location: Left Arm, Patient Position: Sitting, Cuff Size: Normal)   Pulse 95   Temp (!) 97.4 F (36.3 C) (Temporal)   Resp 18   Ht 5\' 5"  (1.651 m)   Wt 175 lb 9.6 oz (79.7 kg)   SpO2 96%   BMI 29.22 kg/m  General appearance: alert, cooperative, appears stated age and no distress Head: Normocephalic, without obvious abnormality, atraumatic Eyes: conjunctivae/corneas clear. PERRL, EOM's intact. Fundi benign. Ears: normal TM's and external ear canals both ears Nose: Nares normal. Septum midline. Mucosa normal. No drainage or sinus tenderness. Throat: lips, mucosa, and tongue normal; teeth and gums normal Neck: no adenopathy,  no carotid bruit, no JVD, supple, symmetrical, trachea midline and thyroid not enlarged, symmetric, no tenderness/mass/nodules Back: symmetric, no curvature. ROM normal. No CVA tenderness. Lungs: clear to auscultation bilaterally Breasts: normal appearance, no masses or tenderness Heart: regular rate and rhythm, S1, S2 normal, no murmur, click, rub or gallop Abdomen: soft, non-tender; bowel sounds normal; no masses,  no organomegaly Pelvic: not indicated; post-menopausal, no abnormal Pap smears in past Extremities: extremities normal, atraumatic, no cyanosis or edema Pulses: 2+ and symmetric Skin: Skin color, texture, turgor normal. No rashes or lesions Lymph nodes: Cervical, supraclavicular, and axillary nodes normal. Neurologic: Alert and oriented X 3, normal strength and tone. Normal symmetric reflexes. Normal coordination and gait    Assessment:    Healthy female exam.      Plan:    ghm utd Check labs  See After Visit Summary for Counseling Recommendations    1. Muscle spasm Stable ,  Muscle relaxer prn - tiZANidine (ZANAFLEX) 4  MG capsule; Take 1 capsule (4 mg total) by mouth 3 (three) times daily as needed for muscle spasms.  Dispense: 30 capsule; Refill: 1  2. Anxiety Stable -- con't meds  - citalopram (CELEXA) 20 MG tablet; Take 1 tablet (20 mg total) by mouth daily.  Dispense: 90 tablet; Refill: 3  3. Need for influenza vaccination   - Flu Vaccine QUAD High Dose(Fluad)  4. Osteoarthritis involving multiple joints on both sides of body Worsening--- pt would like to try celebrex Trial sent to pharmacy - celecoxib (CELEBREX) 100 MG capsule; Take 1 capsule (100 mg total) by mouth daily.  Dispense: 90 capsule; Refill: 1  5. Chest pain, unspecified type ekg--  Nsr,  Since pain is more constant although she is not having it right now-- we will refer to cardiology - EKG 12-Lead - Ambulatory referral to Cardiology  6. Need for pneumococcal vaccination  - Pneumococcal  polysaccharide vaccine 23-valent greater than or equal to 2yo subcutaneous/IM  7. Ischemic heart disease screen  - CBC with Differential/Platelet - Lipid panel - Comprehensive metabolic panel

## 2018-09-22 NOTE — Patient Instructions (Addendum)
Preventive Care 38 Years and Older, Female Preventive care refers to lifestyle choices and visits with your health care provider that can promote health and wellness. This includes:  A yearly physical exam. This is also called an annual well check.  Regular dental and eye exams.  Immunizations.  Screening for certain conditions.  Healthy lifestyle choices, such as diet and exercise. What can I expect for my preventive care visit? Physical exam Your health care provider will check:  Height and weight. These may be used to calculate body mass index (BMI), which is a measurement that tells if you are at a healthy weight.  Heart rate and blood pressure.  Your skin for abnormal spots. Counseling Your health care provider may ask you questions about:  Alcohol, tobacco, and drug use.  Emotional well-being.  Home and relationship well-being.  Sexual activity.  Eating habits.  History of falls.  Memory and ability to understand (cognition).  Work and work Statistician.  Pregnancy and menstrual history. What immunizations do I need?  Influenza (flu) vaccine  This is recommended every year. Tetanus, diphtheria, and pertussis (Tdap) vaccine  You may need a Td booster every 10 years. Varicella (chickenpox) vaccine  You may need this vaccine if you have not already been vaccinated. Zoster (shingles) vaccine  You may need this after age 33. Pneumococcal conjugate (PCV13) vaccine  One dose is recommended after age 33. Pneumococcal polysaccharide (PPSV23) vaccine  One dose is recommended after age 72. Measles, mumps, and rubella (MMR) vaccine  You may need at least one dose of MMR if you were born in 1957 or later. You may also need a second dose. Meningococcal conjugate (MenACWY) vaccine  You may need this if you have certain conditions. Hepatitis A vaccine  You may need this if you have certain conditions or if you travel or work in places where you may be exposed  to hepatitis A. Hepatitis B vaccine  You may need this if you have certain conditions or if you travel or work in places where you may be exposed to hepatitis B. Haemophilus influenzae type b (Hib) vaccine  You may need this if you have certain conditions. You may receive vaccines as individual doses or as more than one vaccine together in one shot (combination vaccines). Talk with your health care provider about the risks and benefits of combination vaccines. What tests do I need? Blood tests  Lipid and cholesterol levels. These may be checked every 5 years, or more frequently depending on your overall health.  Hepatitis C test.  Hepatitis B test. Screening  Lung cancer screening. You may have this screening every year starting at age 39 if you have a 30-pack-year history of smoking and currently smoke or have quit within the past 15 years.  Colorectal cancer screening. All adults should have this screening starting at age 36 and continuing until age 15. Your health care provider may recommend screening at age 23 if you are at increased risk. You will have tests every 1-10 years, depending on your results and the type of screening test.  Diabetes screening. This is done by checking your blood sugar (glucose) after you have not eaten for a while (fasting). You may have this done every 1-3 years.  Mammogram. This may be done every 1-2 years. Talk with your health care provider about how often you should have regular mammograms.  BRCA-related cancer screening. This may be done if you have a family history of breast, ovarian, tubal, or peritoneal cancers.  Other tests  Sexually transmitted disease (STD) testing.  Bone density scan. This is done to screen for osteoporosis. You may have this done starting at age 65. Follow these instructions at home: Eating and drinking  Eat a diet that includes fresh fruits and vegetables, whole grains, lean protein, and low-fat dairy products. Limit  your intake of foods with high amounts of sugar, saturated fats, and salt.  Take vitamin and mineral supplements as recommended by your health care provider.  Do not drink alcohol if your health care provider tells you not to drink.  If you drink alcohol: ? Limit how much you have to 0-1 drink a day. ? Be aware of how much alcohol is in your drink. In the [place] one drink equals one 12 oz bottle of beer (355 mL), one 5 oz glass of wine (148 mL), or one 1 oz glass of hard liquor (44 mL). Lifestyle  Take daily care of your teeth and gums.  Stay active. Exercise for at least 30 minutes on 5 or more days each week.  Do not use any products that contain nicotine or tobacco, such as cigarettes, e-cigarettes, and chewing tobacco. If you need help quitting, ask your health care provider.  If you are sexually active, practice safe sex. Use a condom or other form of protection in order to prevent STIs (sexually transmitted infections).  Talk with your health care provider about taking a low-dose aspirin or statin. What's next?  Go to your health care provider once a year for a well check visit.  Ask your health care provider how often you should have your eyes and teeth checked.  Stay up to date on all vaccines. This information is not intended to replace advice given to you by your health care provider. Make sure you discuss any questions you have with your health care provider. Document Released: 01/20/2015 Document Revised: 12/18/2017 Document Reviewed: 12/18/2017 Elsevier Patient Education  2020 Elsevier Inc[employer]  Preventive Care 65 Years and Older, Female Preventive care refers to lifestyle choices and visits with your health care provider that can promote health and wellness. This includes:  A yearly physical exam. This is also called an annual well check.  Regular dental and eye exams.  Immunizations.  Screening for certain conditions.  Healthy lifestyle choices, such as diet and  exercise. What can I expect for my preventive care visit? Physical exam Your health care provider will check:  Height and weight. These may be used to calculate body mass index (BMI), which is a measurement that tells if you are at a healthy weight.  Heart rate and blood pressure.  Your skin for abnormal spots. Counseling Your health care provider may ask you questions about:  Alcohol, tobacco, and drug use.  Emotional well-being.  Home and relationship well-being.  Sexual activity.  Eating habits.  History of falls.  Memory and ability to understand (cognition).  Work and work environment.  Pregnancy and menstrual history. What immunizations do I need?  Influenza (flu) vaccine  This is recommended every year. Tetanus, diphtheria, and pertussis (Tdap) vaccine  You may need a Td booster every 10 years. Varicella (chickenpox) vaccine  You may need this vaccine if you have not already been vaccinated. Zoster (shingles) vaccine  You may need this after age 60. Pneumococcal conjugate (PCV13) vaccine  One dose is recommended after age 65. Pneumococcal polysaccharide (PPSV23) vaccine  One dose is recommended after age 65. Measles, mumps, and rubella (MMR) vaccine  You may need at   least one dose of MMR if you were born in 1957 or later. You may also need a second dose. Meningococcal conjugate (MenACWY) vaccine  You may need this if you have certain conditions. Hepatitis A vaccine  You may need this if you have certain conditions or if you travel or work in places where you may be exposed to hepatitis A. Hepatitis B vaccine  You may need this if you have certain conditions or if you travel or work in places where you may be exposed to hepatitis B. Haemophilus influenzae type b (Hib) vaccine  You may need this if you have certain conditions. You may receive vaccines as individual doses or as more than one vaccine together in one shot (combination vaccines). Talk  with your health care provider about the risks and benefits of combination vaccines. What tests do I need? Blood tests  Lipid and cholesterol levels. These may be checked every 5 years, or more frequently depending on your overall health.  Hepatitis C test.  Hepatitis B test. Screening  Lung cancer screening. You may have this screening every year starting at age 55 if you have a 30-pack-year history of smoking and currently smoke or have quit within the past 15 years.  Colorectal cancer screening. All adults should have this screening starting at age 50 and continuing until age 75. Your health care provider may recommend screening at age 45 if you are at increased risk. You will have tests every 1-10 years, depending on your results and the type of screening test.  Diabetes screening. This is done by checking your blood sugar (glucose) after you have not eaten for a while (fasting). You may have this done every 1-3 years.  Mammogram. This may be done every 1-2 years. Talk with your health care provider about how often you should have regular mammograms.  BRCA-related cancer screening. This may be done if you have a family history of breast, ovarian, tubal, or peritoneal cancers. Other tests  Sexually transmitted disease (STD) testing.  Bone density scan. This is done to screen for osteoporosis. You may have this done starting at age 65. Follow these instructions at home: Eating and drinking  Eat a diet that includes fresh fruits and vegetables, whole grains, lean protein, and low-fat dairy products. Limit your intake of foods with high amounts of sugar, saturated fats, and salt.  Take vitamin and mineral supplements as recommended by your health care provider.  Do not drink alcohol if your health care provider tells you not to drink.  If you drink alcohol: ? Limit how much you have to 0-1 drink a day. ? Be aware of how much alcohol is in your drink. In the [place] one drink equals  one 12 oz bottle of beer (355 mL), one 5 oz glass of wine (148 mL), or one 1 oz glass of hard liquor (44 mL). Lifestyle  Take daily care of your teeth and gums.  Stay active. Exercise for at least 30 minutes on 5 or more days each week.  Do not use any products that contain nicotine or tobacco, such as cigarettes, e-cigarettes, and chewing tobacco. If you need help quitting, ask your health care provider.  If you are sexually active, practice safe sex. Use a condom or other form of protection in order to prevent STIs (sexually transmitted infections).  Talk with your health care provider about taking a low-dose aspirin or statin. What's next?  Go to your health care provider once a year for a   well check visit.  Ask your health care provider how often you should have your eyes and teeth checked.  Stay up to date on all vaccines. This information is not intended to replace advice given to you by your health care provider. Make sure you discuss any questions you have with your health care provider. Document Released: 01/20/2015 Document Revised: 12/18/2017 Document Reviewed: 12/18/2017 Elsevier Patient Education  2020 Elsevier Inc.  

## 2018-09-23 LAB — COMPREHENSIVE METABOLIC PANEL
ALT: 12 U/L (ref 0–35)
AST: 14 U/L (ref 0–37)
Albumin: 4.4 g/dL (ref 3.5–5.2)
Alkaline Phosphatase: 91 U/L (ref 39–117)
BUN: 17 mg/dL (ref 6–23)
CO2: 29 mEq/L (ref 19–32)
Calcium: 10.4 mg/dL (ref 8.4–10.5)
Chloride: 102 mEq/L (ref 96–112)
Creatinine, Ser: 0.75 mg/dL (ref 0.40–1.20)
GFR: 77.01 mL/min (ref >60.00)
Glucose, Bld: 83 mg/dL (ref 70–99)
Potassium: 4.7 mEq/L (ref 3.5–5.1)
Sodium: 140 mEq/L (ref 135–145)
Total Bilirubin: 0.4 mg/dL (ref 0.2–1.2)
Total Protein: 7.2 g/dL (ref 6.0–8.3)

## 2018-09-23 LAB — CBC WITH DIFFERENTIAL/PLATELET
Basophils Absolute: 0.1 10*3/uL (ref 0.0–0.1)
Basophils Relative: 0.6 % (ref 0.0–3.0)
Eosinophils Absolute: 0.3 10*3/uL (ref 0.0–0.7)
Eosinophils Relative: 3.6 % (ref 0.0–5.0)
HCT: 42.5 % (ref 36.0–46.0)
Hemoglobin: 14.1 g/dL (ref 12.0–15.0)
Lymphocytes Relative: 39.1 % (ref 12.0–46.0)
Lymphs Abs: 3.8 10*3/uL (ref 0.7–4.0)
MCHC: 33.3 g/dL (ref 30.0–36.0)
MCV: 97.1 fl (ref 78.0–100.0)
Monocytes Absolute: 0.7 10*3/uL (ref 0.1–1.0)
Monocytes Relative: 6.9 % (ref 3.0–12.0)
Neutro Abs: 4.8 10*3/uL (ref 1.4–7.7)
Neutrophils Relative %: 49.8 % (ref 43.0–77.0)
Platelets: 373 10*3/uL (ref 150.0–400.0)
RBC: 4.38 Mil/uL (ref 3.87–5.11)
RDW: 15 % (ref 11.5–15.5)
WBC: 9.7 10*3/uL (ref 4.0–10.5)

## 2018-09-23 LAB — LIPID PANEL
Cholesterol: 178 mg/dL (ref 0–200)
HDL: 51.5 mg/dL (ref >39.00)
LDL Cholesterol: 102 mg/dL — ABNORMAL HIGH (ref 0–99)
NonHDL: 126.51
Total CHOL/HDL Ratio: 3
Triglycerides: 122 mg/dL (ref 0.0–149.0)
VLDL: 24.4 mg/dL (ref 0.0–40.0)

## 2018-10-08 ENCOUNTER — Ambulatory Visit: Payer: Medicare HMO | Admitting: Cardiology

## 2018-10-09 ENCOUNTER — Other Ambulatory Visit: Payer: Self-pay

## 2018-10-09 ENCOUNTER — Ambulatory Visit
Admission: RE | Admit: 2018-10-09 | Discharge: 2018-10-09 | Disposition: A | Discharge status: Home / Self Care | Payer: Medicare HMO | Source: Ambulatory Visit | Attending: Family Medicine | Admitting: Family Medicine

## 2018-10-09 DIAGNOSIS — Z1231 Encounter for screening mammogram for malignant neoplasm of breast: Secondary | ICD-10-CM | POA: Diagnosis not present

## 2018-10-09 IMAGING — MG MM DIGITAL SCREENING BILAT W/ TOMO W/ CAD
8 series · 8 of 24 positions shown · non-contrast
Comparison: Previous exam(s).

CLINICAL DATA: Screening.

EXAM:
DIGITAL SCREENING BILATERAL MAMMOGRAM WITH TOMO AND CAD

[R CC synth-2D]
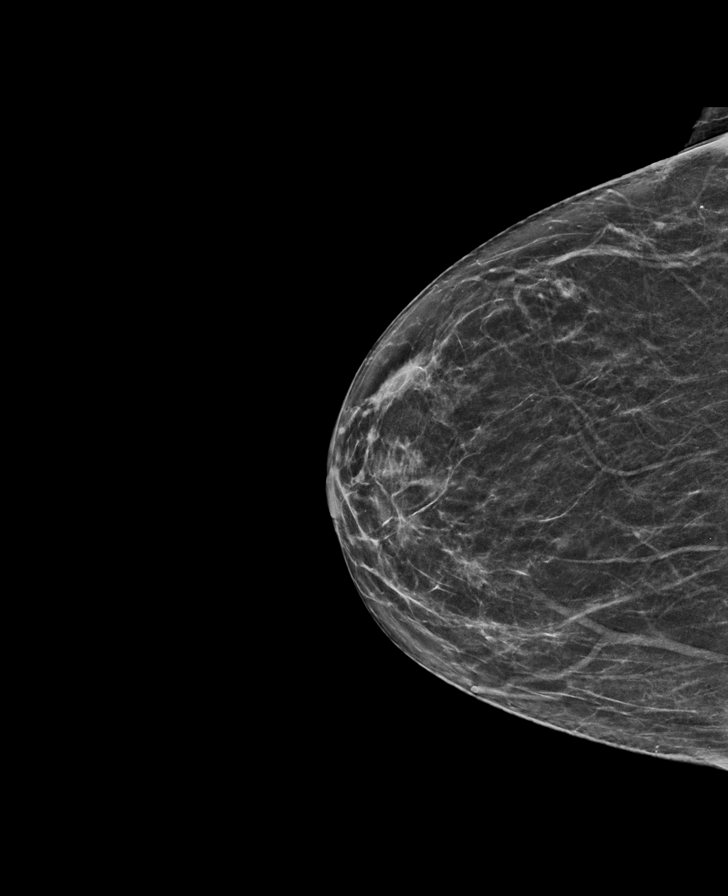

[R MLO synth-2D]
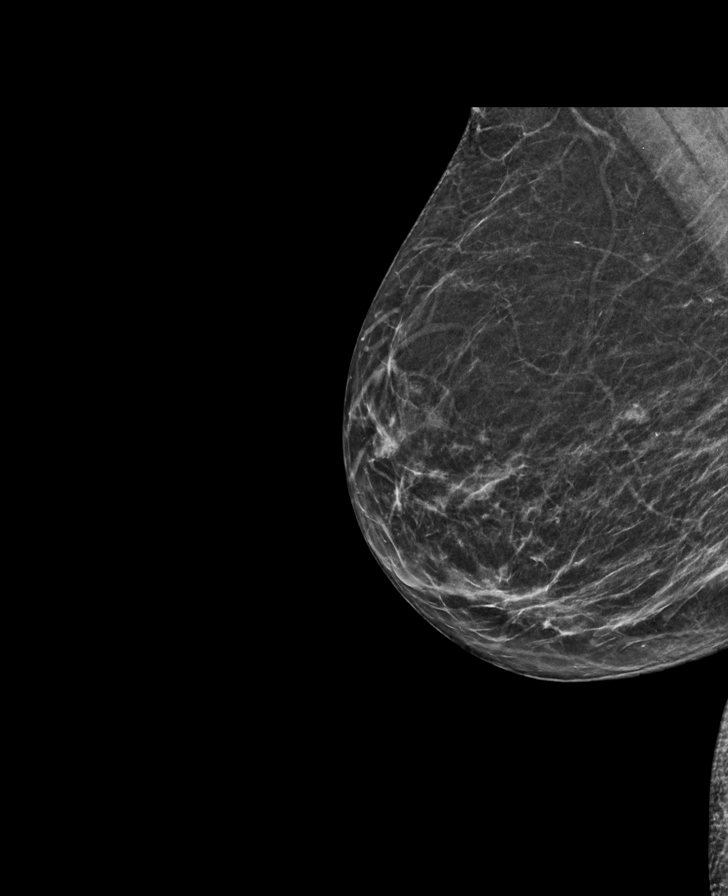

[L MLO synth-2D]
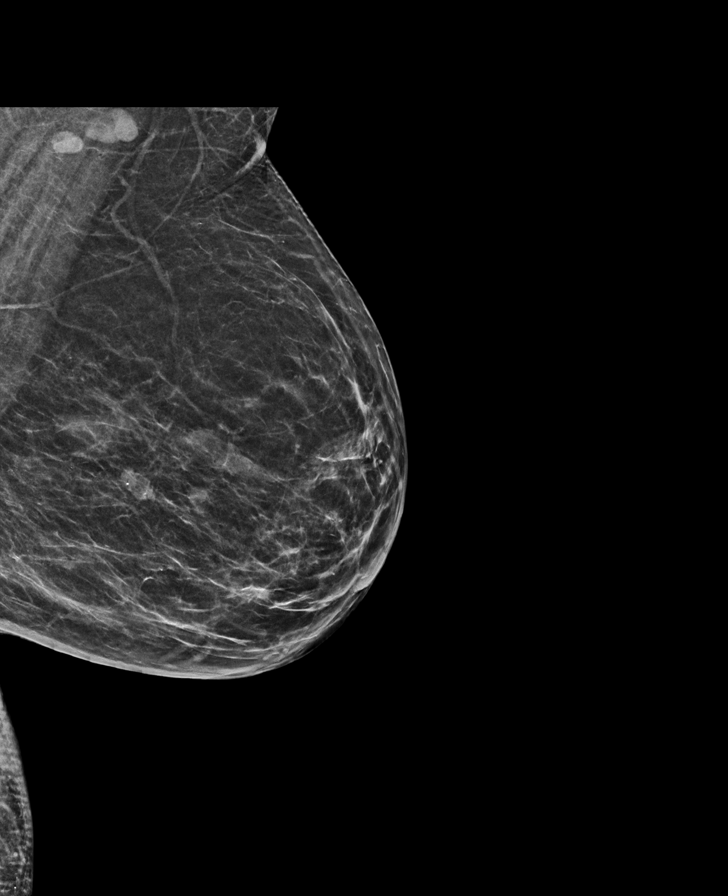

[L CC synth-2D]
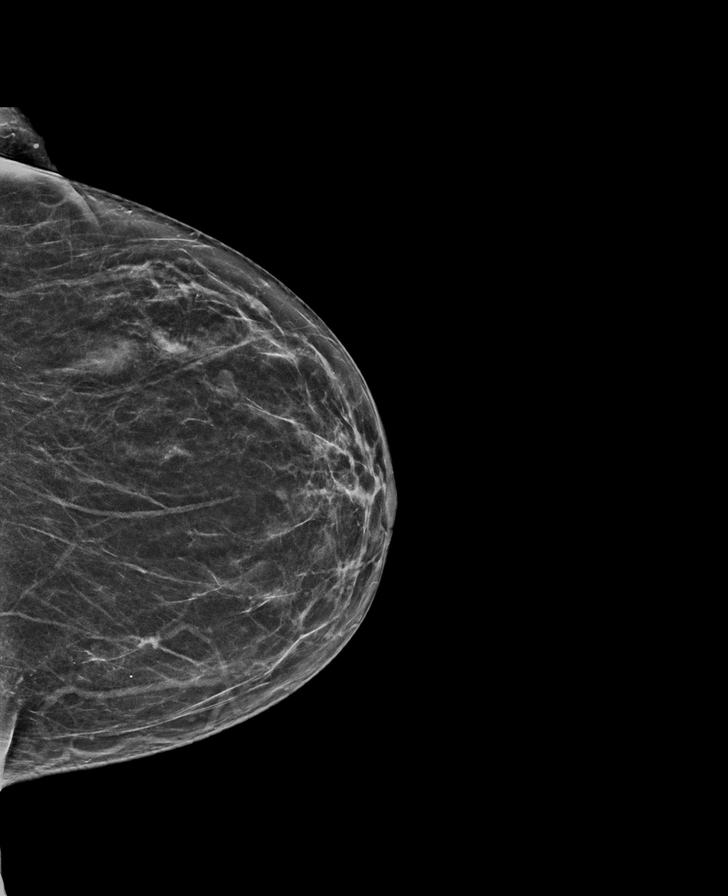

[L MLO tomo · tomo slice 31/60.0]
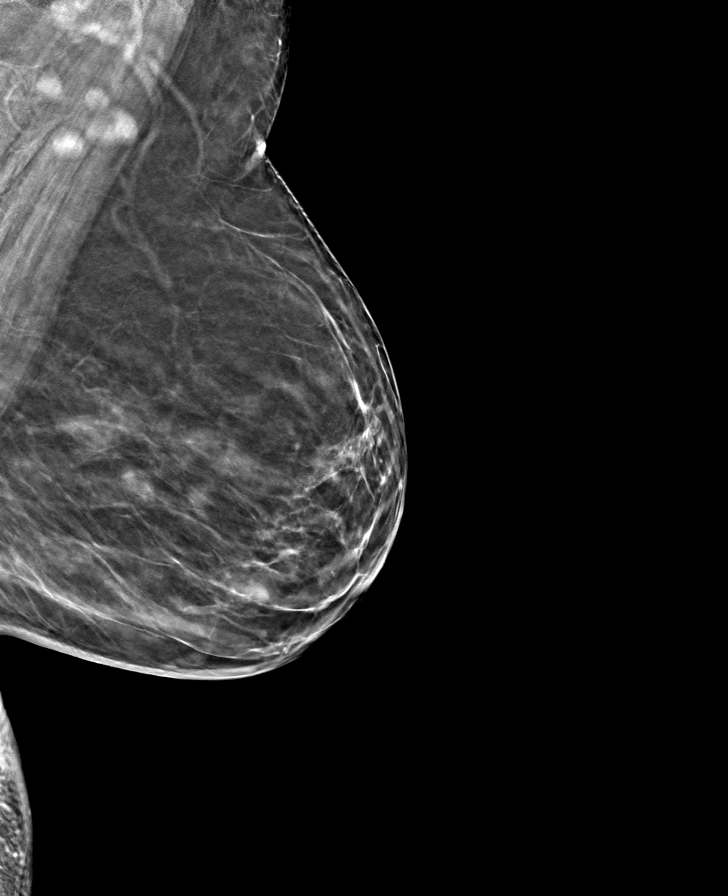

[R MLO tomo · tomo slice 29/57.0]
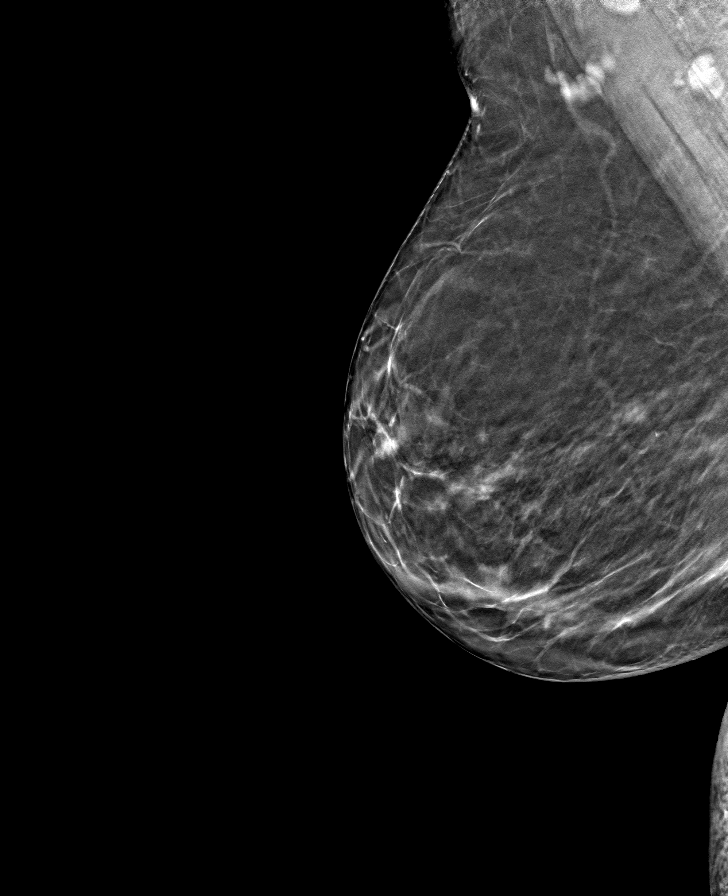

[L CC tomo · tomo slice 30/59.0]
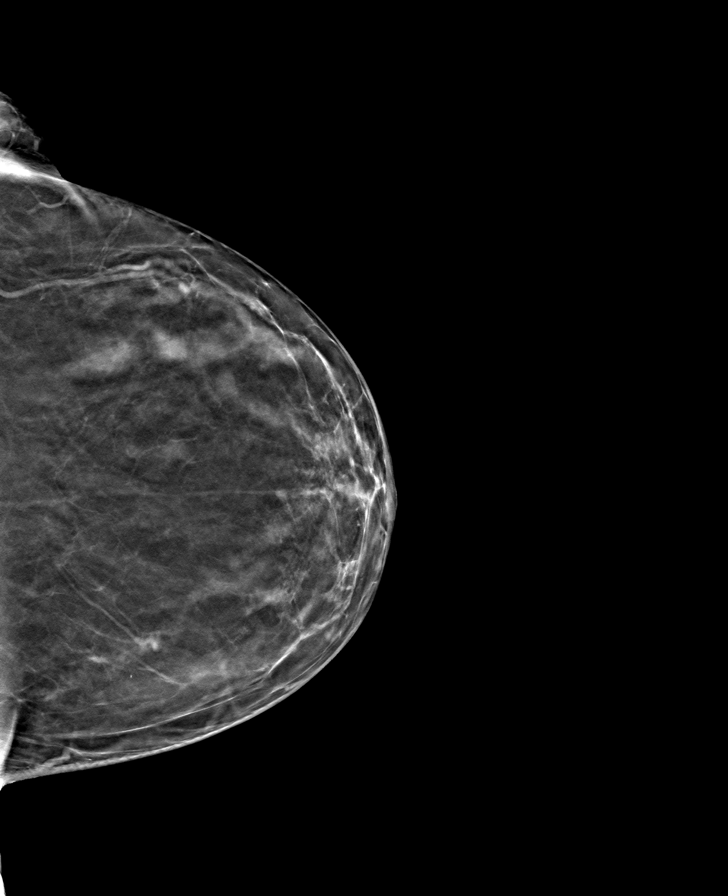

[R CC tomo · tomo slice 31/61.0]
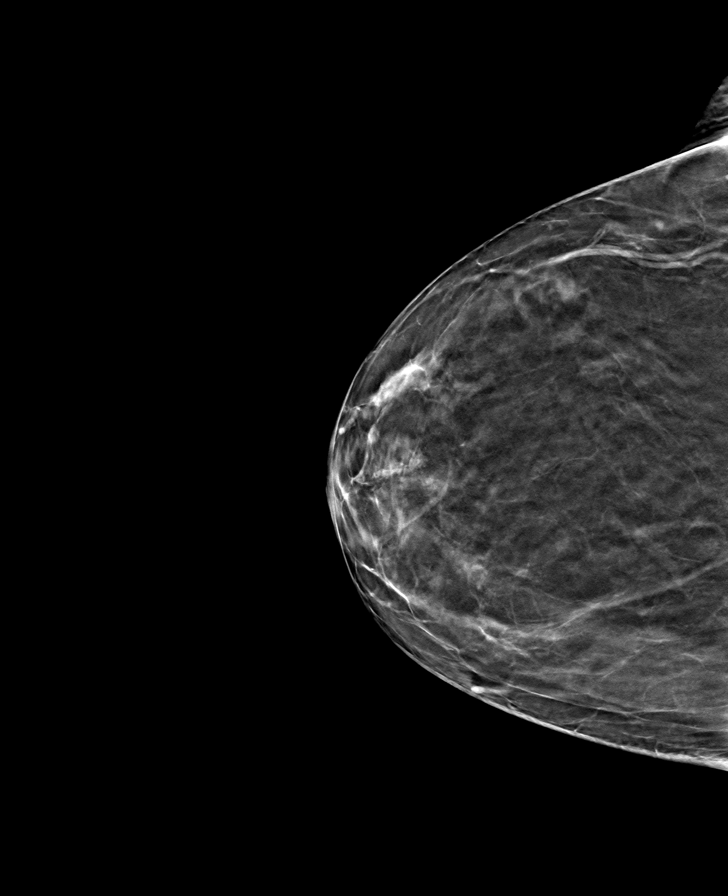

[8 of 24 positions shown; findings below may reference images not displayed]

ACR Breast Density Category b: There are scattered areas of
fibroglandular density.
FINDINGS: In the left breast, a possible asymmetry warrants further
evaluation. In the right breast, no findings suspicious for
malignancy. Images were processed with CAD.
IMPRESSION: Further evaluation is suggested for possible asymmetry in the left
breast.

RECOMMENDATION:
Diagnostic mammogram and possibly ultrasound of the left breast.
(Code:[XJ])

The patient will be contacted regarding the findings, and additional
imaging will be scheduled.

BI-RADS CATEGORY  0: Incomplete. Need additional imaging evaluation
and/or prior mammograms for comparison.

## 2018-10-13 ENCOUNTER — Other Ambulatory Visit: Payer: Self-pay | Admitting: Family Medicine

## 2018-10-13 DIAGNOSIS — R928 Other abnormal and inconclusive findings on diagnostic imaging of breast: Secondary | ICD-10-CM

## 2018-10-15 ENCOUNTER — Encounter: Payer: Self-pay | Admitting: Family Medicine

## 2018-10-15 DIAGNOSIS — H9190 Unspecified hearing loss, unspecified ear: Secondary | ICD-10-CM

## 2018-10-16 ENCOUNTER — Other Ambulatory Visit: Payer: Self-pay

## 2018-10-16 ENCOUNTER — Ambulatory Visit: Payer: Medicare HMO

## 2018-10-16 ENCOUNTER — Ambulatory Visit
Admission: RE | Admit: 2018-10-16 | Discharge: 2018-10-16 | Disposition: A | Discharge status: Home / Self Care | Payer: Medicare HMO | Source: Ambulatory Visit | Attending: Family Medicine | Admitting: Family Medicine

## 2018-10-16 ENCOUNTER — Ambulatory Visit (INDEPENDENT_AMBULATORY_CARE_PROVIDER_SITE_OTHER)
Admission: RE | Admit: 2018-10-16 | Discharge: 2018-10-16 | Disposition: A | Discharge status: Home / Self Care | Payer: Medicare HMO | Source: Ambulatory Visit | Attending: Acute Care | Admitting: Acute Care

## 2018-10-16 DIAGNOSIS — Z87891 Personal history of nicotine dependence: Secondary | ICD-10-CM

## 2018-10-16 DIAGNOSIS — R928 Other abnormal and inconclusive findings on diagnostic imaging of breast: Secondary | ICD-10-CM | POA: Diagnosis not present

## 2018-10-16 DIAGNOSIS — R69 Illness, unspecified: Secondary | ICD-10-CM | POA: Diagnosis not present

## 2018-10-16 DIAGNOSIS — Z122 Encounter for screening for malignant neoplasm of respiratory organs: Secondary | ICD-10-CM

## 2018-10-16 DIAGNOSIS — F17210 Nicotine dependence, cigarettes, uncomplicated: Secondary | ICD-10-CM

## 2018-10-16 IMAGING — MG MM DIGITAL DIAGNOSTIC UNILAT*L* W/ TOMO W/ CAD
4 series · 4 of 12 positions shown · non-contrast
Comparison: Previous exam(s).

CLINICAL DATA: 68-year-old female for further evaluation of LEFT
breast asymmetry on screening mammogram.

EXAM:
DIGITAL DIAGNOSTIC UNILATERAL LEFT MAMMOGRAM WITH CAD AND TOMO

[L ML synth-2D]
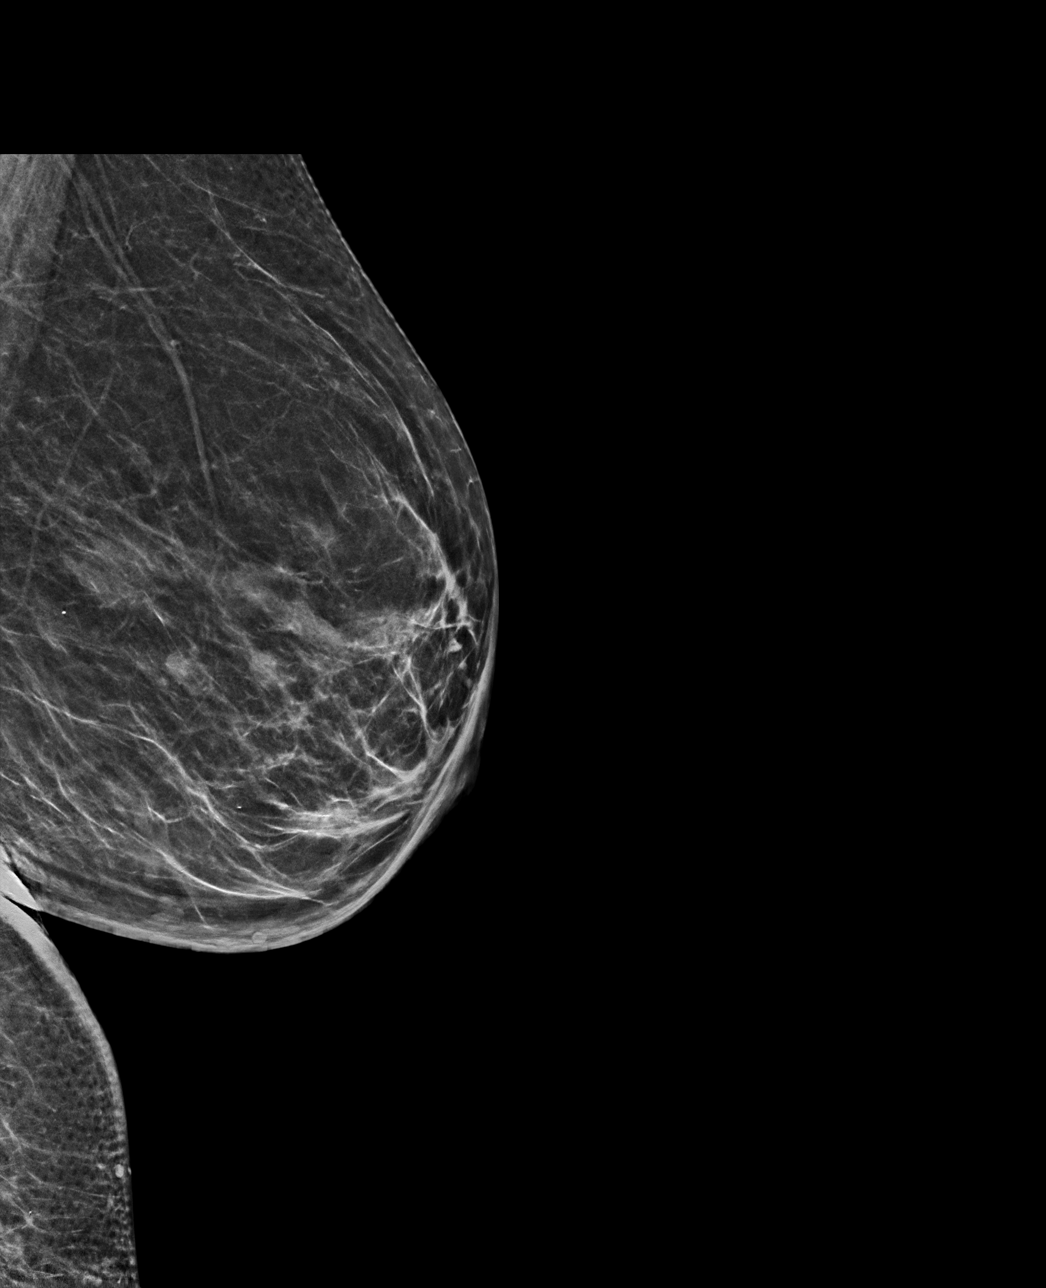

[L MLO synth-2D]
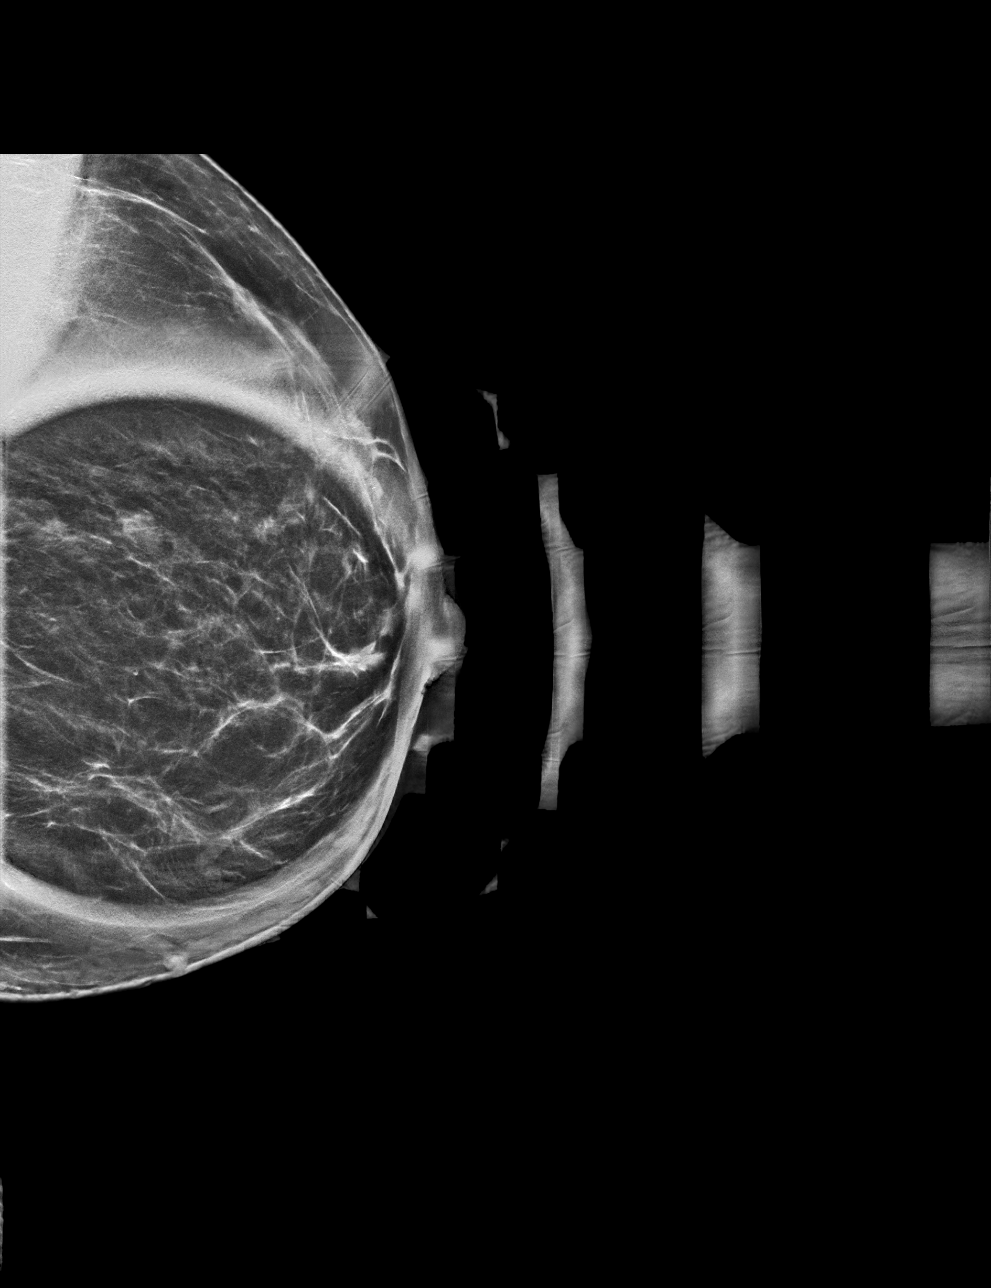

[L ML tomo · tomo slice 33/64.0]
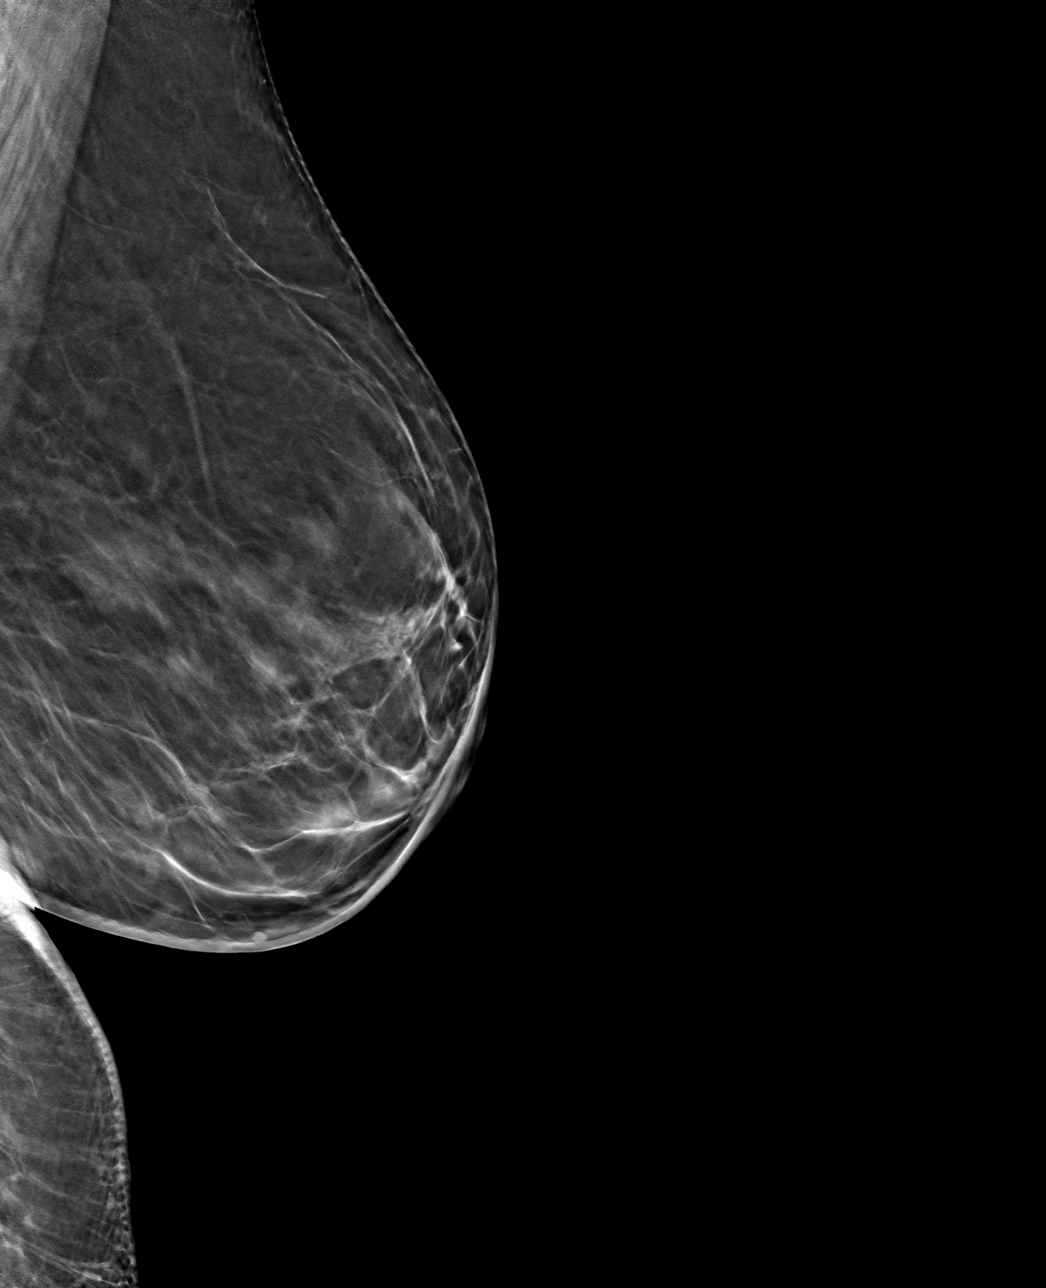

[L MLO tomo · tomo slice 30/59.0]
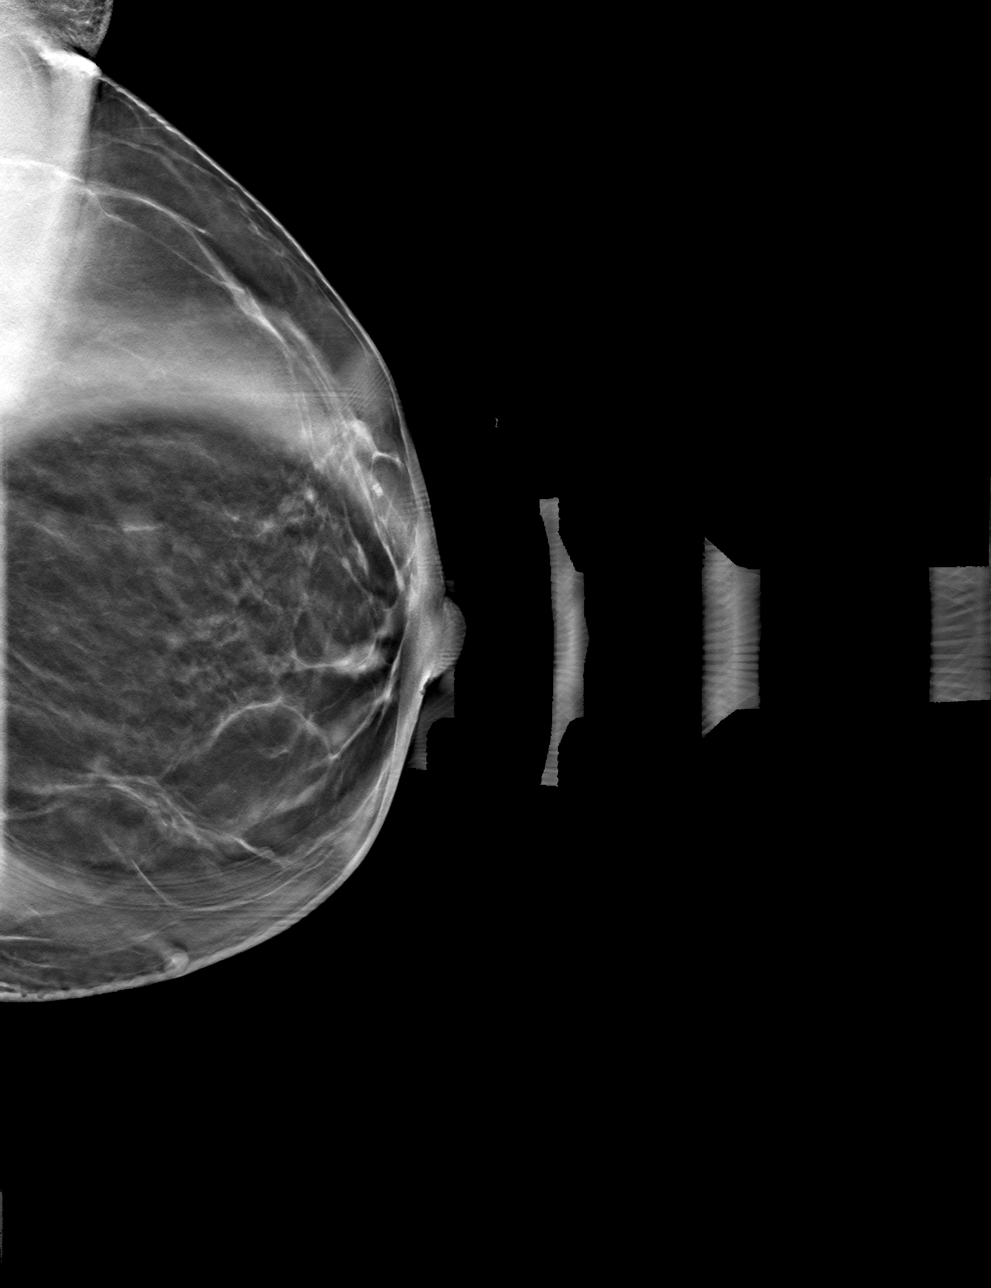

[4 of 12 positions shown; findings below may reference images not displayed]

ACR Breast Density Category b: There are scattered areas of
fibroglandular density.
FINDINGS: 2D/3D full field and spot compression views of the LEFT breast
demonstrate no persistent abnormality at the site of the screening
study finding. No suspicious mass, distortion or worrisome
calcifications in this area noted.

Mammographic images were processed with CAD.
IMPRESSION: No persistent abnormality at the site of the screening study
finding.

RECOMMENDATION:
Bilateral screening mammogram in 1 year.

I have discussed the findings and recommendations with the patient.
If applicable, a reminder letter will be sent to the patient
regarding the next appointment.

BI-RADS CATEGORY  1: Negative.

## 2018-10-16 IMAGING — CT CT CHEST LUNG CANCER SCREENING LOW DOSE W/O CM
2 of 5 series · 15 of 40 positions shown, 18 images · non-contrast
Comparison: Low-dose lung cancer screening CT chest dated
[DATE]

CLINICAL DATA: 68-year-old female current smoker, with 54 pack-year
history of smoking, for follow-up lung cancer screening

EXAM:
CT CHEST WITHOUT CONTRAST LOW-DOSE FOR LUNG CANCER SCREENING
TECHNIQUE: Multidetector CT imaging of the chest was performed following the
standard protocol without IV contrast.

[Series 3: lung thins 1.0 · axial · 0.72mm/px · z∈[-323,-55]mm · 12 of 298 slices shown, 15 images]
[im 15/298  mediastinal]
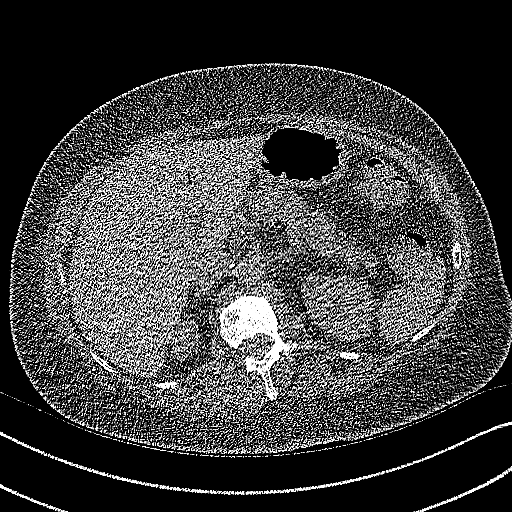
[im 15/298  lung]
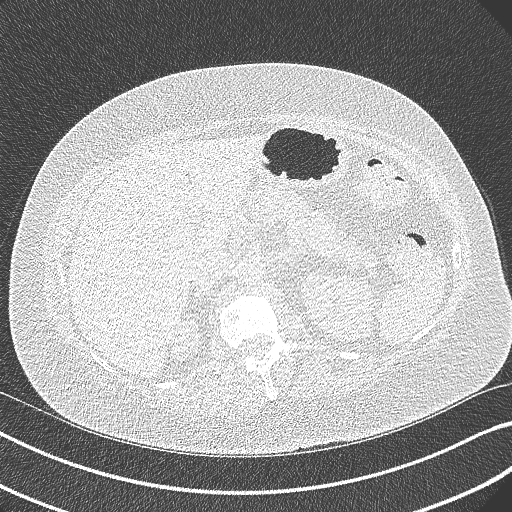
[im 45/298  lung]
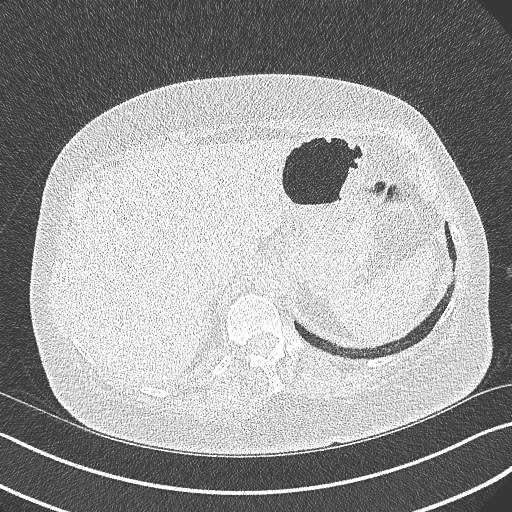
[im 60/298  lung]
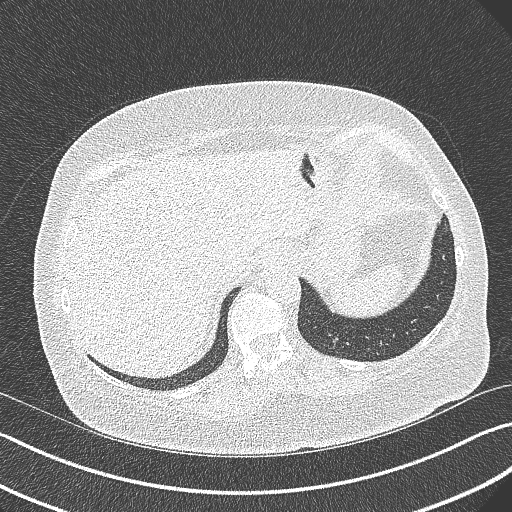
[im 90/298  lung]
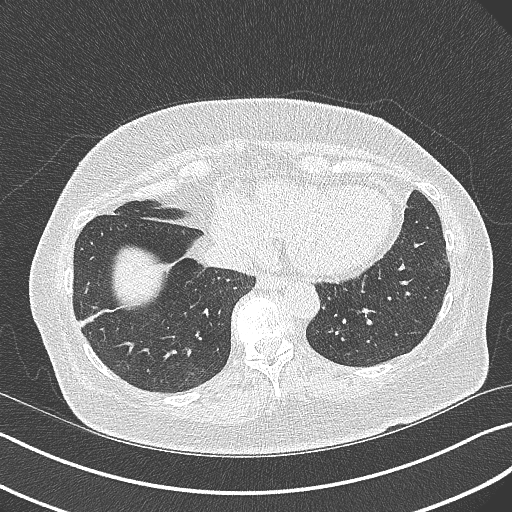
[im 119/298  mediastinal]
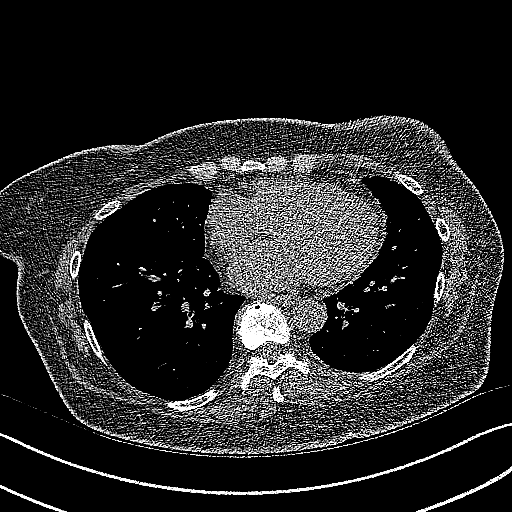
[im 119/298  lung]
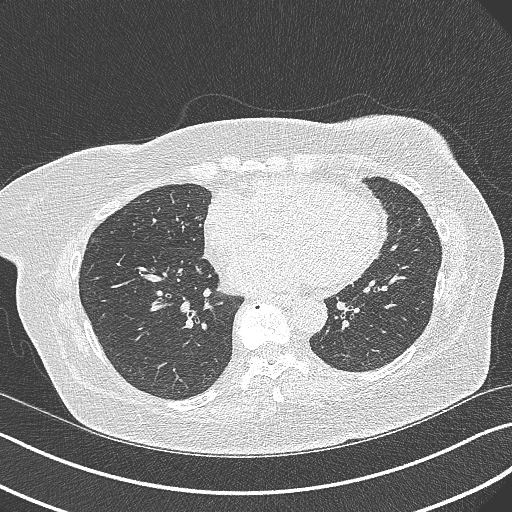
[im 134/298  lung]
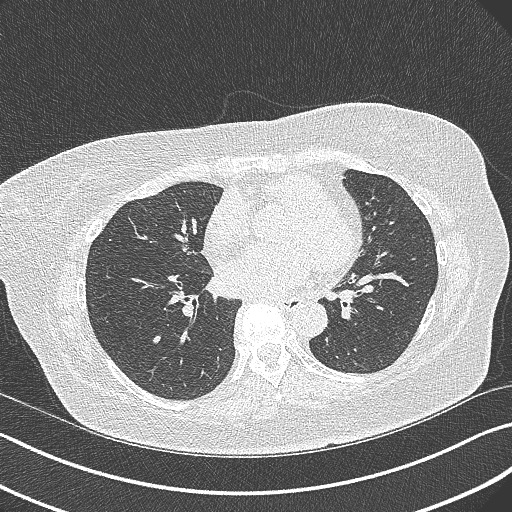
[im 164/298  lung]
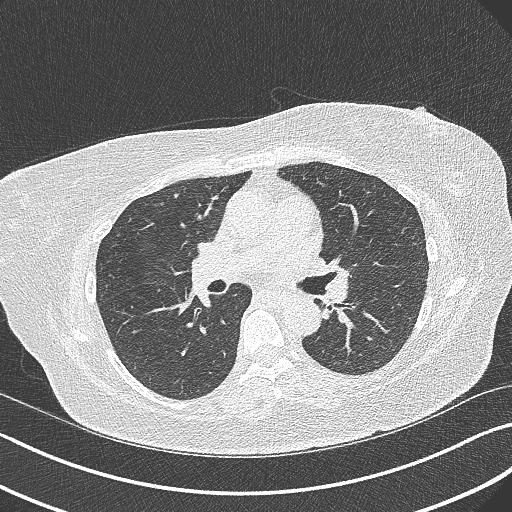
[im 179/298  lung]
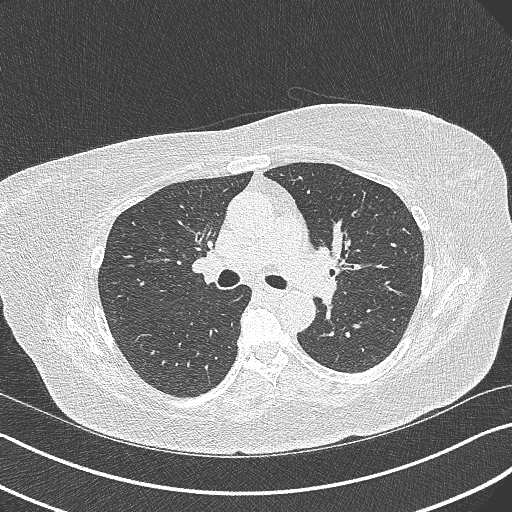
[im 208/298  mediastinal]
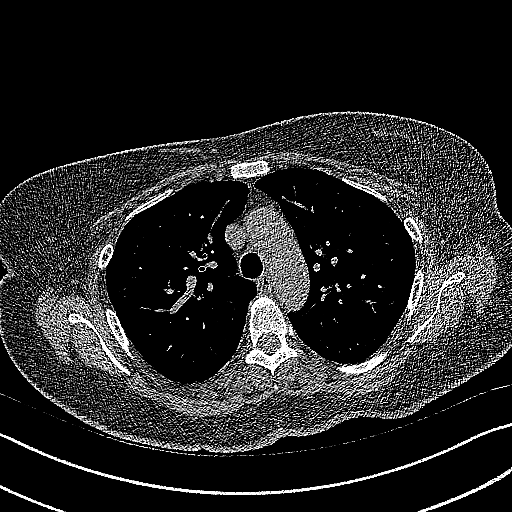
[im 208/298  lung]
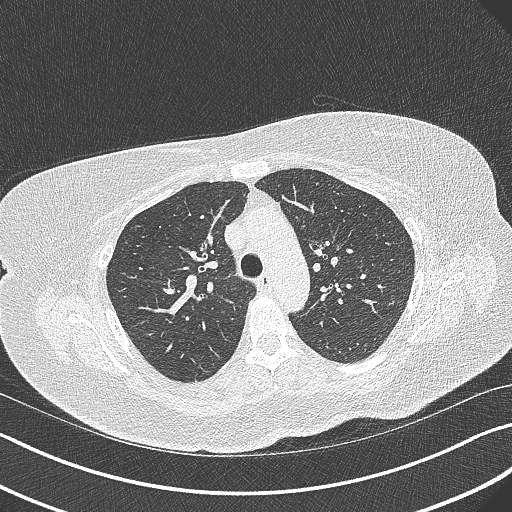
[im 238/298  lung]
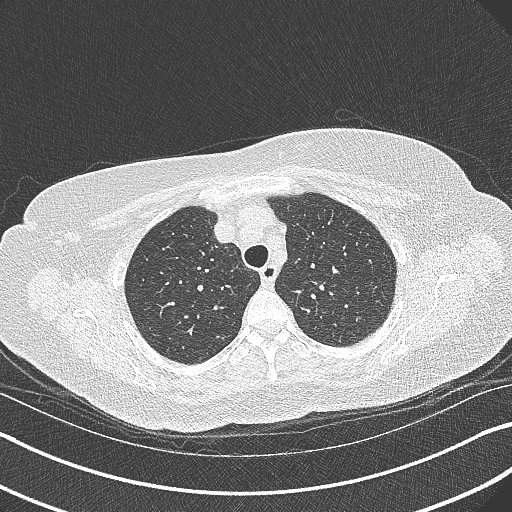
[im 253/298  lung]
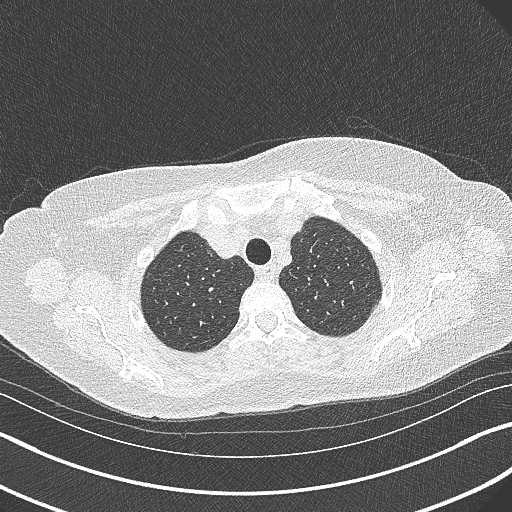
[im 283/298  lung]
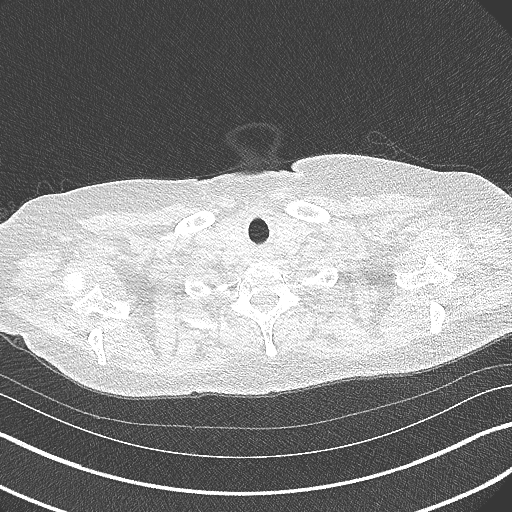

[Series 5: coronal · coronal · 0.59mm/px · 3 of 107 slices shown]
[im 22/107  lung]
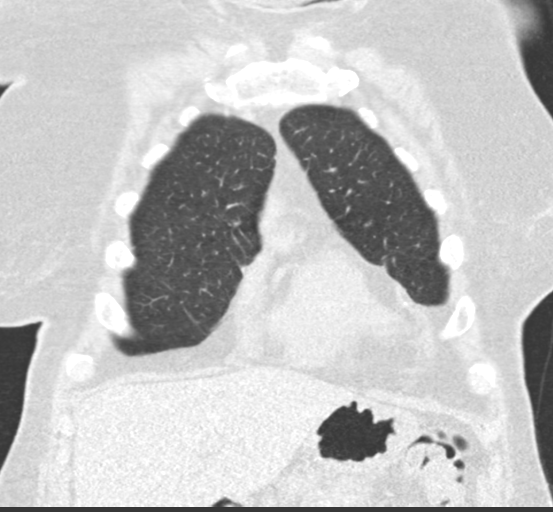
[im 43/107  lung]
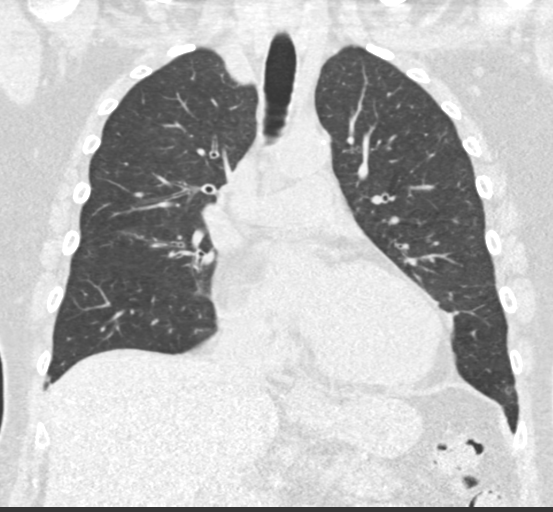
[im 64/107  lung]
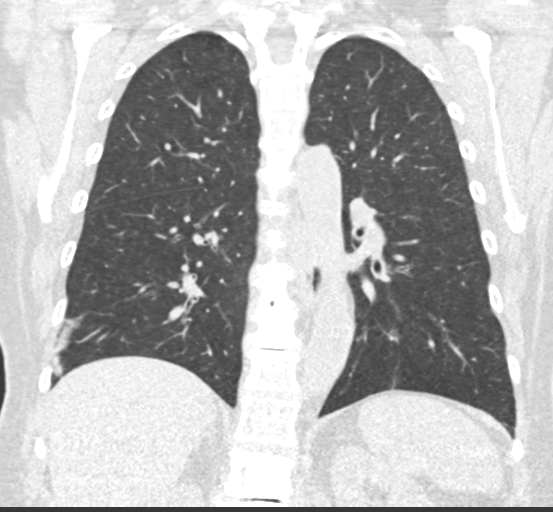

[15 of 40 positions shown; findings below may reference images not displayed]

FINDINGS: Cardiovascular: The heart is normal in size. No pericardial
effusion.

No evidence of thoracic aortic aneurysm. Atherosclerotic
calcifications of the aortic arch.

Coronary atherosclerosis of the LAD.

Mediastinum/Nodes: No suspicious mediastinal lymphadenopathy.

Visualized thyroid is unremarkable.

Lungs/Pleura: Mild centrilobular emphysematous changes, upper lung
predominant.

Linear scarring/atelectasis in the right lower lobe.

No focal consolidation.

Small subpleural right lung nodules measuring up to 2.9 mm,
unchanged.

No pleural effusion or pneumothorax.

Upper Abdomen: Visualized upper abdomen is grossly unremarkable,
noting mild vascular calcifications.

Musculoskeletal: Degenerative changes of the visualized
thoracolumbar spine.
IMPRESSION: Lung-RADS 2, benign appearance or behavior. Continue annual
screening with low-dose chest CT without contrast in 12 months.

Aortic Atherosclerosis ([HQ]-[HQ]) and Emphysema ([HQ]-[HQ]).

## 2018-10-19 ENCOUNTER — Other Ambulatory Visit: Payer: Self-pay | Admitting: *Deleted

## 2018-10-19 DIAGNOSIS — F17210 Nicotine dependence, cigarettes, uncomplicated: Secondary | ICD-10-CM

## 2018-10-19 DIAGNOSIS — Z87891 Personal history of nicotine dependence: Secondary | ICD-10-CM

## 2018-10-19 DIAGNOSIS — Z122 Encounter for screening for malignant neoplasm of respiratory organs: Secondary | ICD-10-CM

## 2018-10-27 ENCOUNTER — Ambulatory Visit: Payer: Medicare HMO | Admitting: Audiology

## 2018-11-09 ENCOUNTER — Ambulatory Visit: Payer: Medicare HMO | Admitting: Audiology

## 2018-11-18 ENCOUNTER — Ambulatory Visit: Payer: Medicare HMO | Admitting: Cardiology

## 2018-12-08 ENCOUNTER — Ambulatory Visit
Admission: RE | Admit: 2018-12-08 | Discharge: 2018-12-08 | Disposition: A | Discharge status: Home / Self Care | Payer: Medicare HMO | Source: Ambulatory Visit | Attending: Family Medicine | Admitting: Family Medicine

## 2018-12-08 ENCOUNTER — Other Ambulatory Visit: Payer: Self-pay

## 2018-12-08 DIAGNOSIS — M85852 Other specified disorders of bone density and structure, left thigh: Secondary | ICD-10-CM | POA: Diagnosis not present

## 2018-12-08 DIAGNOSIS — Z780 Asymptomatic menopausal state: Secondary | ICD-10-CM

## 2018-12-08 DIAGNOSIS — M810 Age-related osteoporosis without current pathological fracture: Secondary | ICD-10-CM | POA: Diagnosis not present

## 2018-12-15 ENCOUNTER — Ambulatory Visit: Payer: Medicare HMO | Admitting: Cardiology

## 2018-12-15 ENCOUNTER — Encounter: Payer: Self-pay | Admitting: Family Medicine

## 2018-12-15 NOTE — Telephone Encounter (Signed)
That is usually the only one covered --- she will need to check with her insurance ---  Other options are actonel or boniva but they are usually more expernsive

## 2018-12-17 MED ORDER — ALENDRONATE SODIUM 70 MG PO TABS: 70.0000 mg | ORAL_TABLET | ORAL | 2 refills | Status: DC

## 2019-01-11 ENCOUNTER — Ambulatory Visit: Payer: Medicare HMO | Admitting: Cardiology

## 2019-03-08 ENCOUNTER — Encounter: Payer: Self-pay | Admitting: *Deleted

## 2019-03-08 ENCOUNTER — Ambulatory Visit: Payer: Medicare HMO | Admitting: Cardiology

## 2019-03-08 ENCOUNTER — Other Ambulatory Visit: Payer: Self-pay

## 2019-03-08 ENCOUNTER — Encounter: Payer: Self-pay | Admitting: Cardiology

## 2019-03-08 DIAGNOSIS — R9431 Abnormal electrocardiogram [ECG] [EKG]: Secondary | ICD-10-CM

## 2019-03-08 DIAGNOSIS — E782 Mixed hyperlipidemia: Secondary | ICD-10-CM

## 2019-03-08 DIAGNOSIS — R072 Precordial pain: Secondary | ICD-10-CM | POA: Diagnosis not present

## 2019-03-08 DIAGNOSIS — Z720 Tobacco use: Secondary | ICD-10-CM | POA: Diagnosis not present

## 2019-03-08 DIAGNOSIS — R0602 Shortness of breath: Secondary | ICD-10-CM | POA: Diagnosis not present

## 2019-03-08 DIAGNOSIS — R079 Chest pain, unspecified: Secondary | ICD-10-CM | POA: Diagnosis not present

## 2019-03-08 MED ORDER — NITROGLYCERIN 0.4 MG SL SUBL: 0.4000 mg | SUBLINGUAL_TABLET | SUBLINGUAL | 3 refills | Status: DC | PRN

## 2019-03-08 NOTE — Addendum Note (Signed)
Addended by: Particia Nearing B on: 03/08/2019 11:35 AM   Modules accepted: Orders

## 2019-03-08 NOTE — Patient Instructions (Signed)
Medication Instructions:  Your physician has recommended you make the following change in your medication:   Nitroglycerin 0.4 mg sublingual (under your tongue) as needed for chest pain. If experiencing chest pain, stop what you are doing and sit down. Take 1 nitroglycerin and wait 5 minutes. If chest pain continues, take another nitroglycerin and wait 5 minutes. If chest pain does not subside, take 1 more nitroglycerin and dial 911. You make take a total of 3 nitroglycerin in a 15 minute time frame.   *If you need a refill on your cardiac medications before your next appointment, please call your pharmacy*   Lab Work: None If you have labs (blood work) drawn today and your tests are completely normal, you will receive your results only by: Marland Kitchen MyChart Message (if you have MyChart) OR . A paper copy in the mail If you have any lab test that is abnormal or we need to change your treatment, we will call you to review the results.   Testing/Procedures: Your physician has requested that you have an echocardiogram. Echocardiography is a painless test that uses sound waves to create images of your heart. It provides your doctor with information about the size and shape of your heart and how well your heart's chambers and valves are working. This procedure takes approximately one hour. There are no restrictions for this procedure.  Your physician has requested that you have en exercise stress myoview. For further information please visit [email or web address]. Please follow instruction sheet, as given.    Follow-Up: At [facility], you and your health needs are our priority.  As part of our continuing mission to provide you with exceptional heart care, we have created designated Provider Care Teams.  These Care Teams include your primary Cardiologist (physician) and Advanced Practice Providers (APPs -  Physician Assistants and Nurse Practitioners) who all work together to provide you with the care  you need, when you need it.  We recommend signing up for the patient portal called "MyChart".  Sign up information is provided on this After Visit Summary.  MyChart is used to connect with patients for Virtual Visits (Telemedicine).  Patients are able to view lab/test results, encounter notes, upcoming appointments, etc.  Non-urgent messages can be sent to your provider as well.   To learn more about what you can do with MyChart, go to [email or web address].    Your next appointment:   3 month(s)  The format for your next appointment:   In Person  Provider:   Berniece Salines, DO   Other Instructions

## 2019-03-08 NOTE — Progress Notes (Signed)
Cardiology Office Note:    Date:  03/08/2019   ID:  MORNING OYEN, DOB [birth year], MRN [id number]  PCP:  Carollee Herter, Alferd Apa, DO  Cardiologist:  No primary care provider on file.  Electrophysiologist:  None   Referring MD: Carollee Herter, Alferd Apa, *  The patient was referred for chest pain by her pcp.    History of Present Illness:    Jasmine Mclaughlin is a 69 y.o. female with a hx of COPD, current smoker, anxiety and osteoporosis was referred by her primary care provider to be evaluated for chest pain.  Patient tells me that over the last several months she has been experiencing intermittent chest pain.  First started as a left-sided sharp pain she feels behind her breast area with no radiation.  But now most recently this has become a chest tightness last for several minutes prior to resolution.  This is associated with shortness of breath.  She denies any palpitations.  No other complaints at this time.  Past Medical History:  Diagnosis Date  . Allergy   . Anxiety   . Arthritis    degenerative back, joint disturbance "everywhere "  . Asthma   . Colon polyps   . COPD (chronic obstructive pulmonary disease) ([place])   . Genital warts   . Osteoporosis   . Pneumonia   . Shortness of breath    pt./ reports that she is out of shape    Past Surgical History:  Procedure Laterality Date  . ANTERIOR FUSION LUMBAR SPINE  05/06/2012  . ANTERIOR LAT LUMBAR FUSION N/A 05/06/2012   Procedure: ANTERIOR LATERAL LUMBAR FUSION 3 LEVELS;  Surgeon: Sinclair Ship, MD;  Location: [facility];  Service: Orthopedics;  Laterality: N/A;  Lateral interbody fusion, lumbar 2-3,lumbar 3-4, lumbar 4-5 with instrumentation and allograft.  . COLONOSCOPY    . COLONOSCOPY WITH PROPOFOL N/A 12/15/2017   Procedure: COLONOSCOPY WITH PROPOFOL;  Surgeon: Rush Landmark Telford Nab., MD;  Location: [facility];  Service: Gastroenterology;  Laterality: N/A;  . COLONOSCOPY WITH PROPOFOL N/A 07/08/2018   Procedure: COLONOSCOPY  WITH PROPOFOL;  Surgeon: Rush Landmark Telford Nab., MD;  Location: [facility];  Service: Gastroenterology;  Laterality: N/A;  . KNEE ARTHROSCOPY Right     R knee 01/2012, for ligament tears  . POLYPECTOMY  07/08/2018   Procedure: POLYPECTOMY;  Surgeon: Mansouraty, Telford Nab., MD;  Location: [place];  Service: Gastroenterology;;  . TONSILLECTOMY    . VAGINAL DELIVERY     x1    Current Medications: Current Meds  Medication Sig  . acetaminophen (TYLENOL) 500 MG tablet Take 1,000 mg by mouth 2 (two) times daily as needed for mild pain or headache.   . albuterol (PROVENTIL HFA;VENTOLIN HFA) 108 (90 Base) MCG/ACT inhaler Inhale 2 puffs into the lungs every 6 (six) hours as needed for wheezing or shortness of breath.  Marland Kitchen alendronate (FOSAMAX) 70 MG tablet Take 1 tablet (70 mg total) by mouth every 7 (seven) days. Take with a full glass of water on an empty stomach.  . citalopram (CELEXA) 20 MG tablet Take 1 tablet (20 mg total) by mouth daily.  Marland Kitchen tiZANidine (ZANAFLEX) 4 MG capsule Take 1 capsule (4 mg total) by mouth 3 (three) times daily as needed for muscle spasms.     Allergies:   Patient has no known allergies.   Social History   Socioeconomic History  . Marital status: Single    Spouse name: Not on file  . Number of children: 1  .  Years of education: Not on file  . Highest education level: Not on file  Occupational History  . 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 Smoking status: Current Every Day Smoker    Packs/day: 1.00    Years: 55.00    Pack years: 55.00    Types: Cigarettes  . Smokeless tobacco: Never Used  . Tobacco comment: Down to 1 cigarette daily  Substance and Sexual Activity  . Alcohol use: Yes    Alcohol/week: 0.0 standard drinks    Comment: "sporatic"- wine, beer, mixed drink  . Drug use: No  . Sexual activity: Not Currently    Partners: Male  Other Topics Concern  . Not on file  Social History Narrative   1  caffeine drinks daily   No exercise   Social Determinants of Health   Financial Resource Strain:   . Difficulty of Paying Living Expenses: Not on file  Food Insecurity:   . Worried About Charity fundraiser in the Last Year: Not on file  . Ran Out of Food in the Last Year: Not on file  Transportation Needs:   . Lack of Transportation (Medical): Not on file  . Lack of Transportation (Non-Medical): Not on file  Physical Activity:   . Days of Exercise per Week: Not on file  . Minutes of Exercise per Session: Not on file  Stress:   . Feeling of Stress : Not on file  Social Connections:   . Frequency of Communication with Friends and Family: Not on file  . Frequency of Social Gatherings with Friends and Family: Not on file  . Attends Religious Services: Not on file  . Active Member of Clubs or Organizations: Not on file  . Attends Archivist Meetings: Not on file  . Marital Status: Not on file     Family History: The patient's family history includes Arthritis in her father and mother; Asthma in her daughter and maternal grandmother; Breast cancer in her maternal grandmother; Colon cancer (age of onset: 57) in her father; Heart disease (age of onset: 22) in her father; Stroke in her father. There is no history of Esophageal cancer, Ulcerative colitis, or Stomach cancer.  ROS:   Review of Systems  Constitution: Negative for decreased appetite, fever and weight gain.  HENT: Negative for congestion, ear discharge, hoarse voice and sore throat.   Eyes: Negative for discharge, redness, vision loss in right eye and visual halos.  Cardiovascular: Reports chest pain, dyspnea on exertion.  Negative for leg swelling, orthopnea and palpitations.  Respiratory: Negative for cough, hemoptysis, shortness of breath and snoring.   Endocrine: Negative for heat intolerance and polyphagia.  Hematologic/Lymphatic: Negative for bleeding problem. Does not bruise/bleed easily.  Skin: Negative for  flushing, nail changes, rash and suspicious lesions.  Musculoskeletal: Negative for arthritis, joint pain, muscle cramps, myalgias, neck pain and stiffness.  Gastrointestinal: Negative for abdominal pain, bowel incontinence, diarrhea and excessive appetite.  Genitourinary: Negative for decreased libido, genital sores and incomplete emptying.  Neurological: Negative for brief paralysis, focal weakness, headaches and loss of balance.  Psychiatric/Behavioral: Negative for altered mental status, depression and suicidal ideas.  Allergic/Immunologic: Negative for HIV exposure and persistent infections.    EKGs/Labs/Other Studies Reviewed:    The following studies were reviewed today:   EKG:  The ekg ordered today demonstrates monitor, heart rate 67 bpm with P wave morphology suggesting right atrial enlargement compared to EKG done on September 22, 2018 at that time the patient is well in sinus bradycardia.  Recent Labs: 09/22/2018: ALT 12; BUN 17; Creatinine, Ser 0.75; Hemoglobin 14.1; Platelets 373.0; Potassium 4.7; Sodium 140  Recent Lipid Panel    Component Value Date/Time   CHOL 178 09/22/2018 1435   TRIG 122.0 09/22/2018 1435   HDL 51.50 09/22/2018 1435   CHOLHDL 3 09/22/2018 1435   VLDL 24.4 09/22/2018 1435   LDLCALC 102 (H) 09/22/2018 1435    Physical Exam:    VS:  BP 128/68   Pulse 65   Temp 97.7 F (36.5 C)   Ht 5\' 5"  (1.651 m)   Wt 179 lb 1.3 oz (81.2 kg)   SpO2 96%   BMI 29.80 kg/m     Wt Readings from Last 3 Encounters:  03/08/19 179 lb 1.3 oz (81.2 kg)  09/22/18 175 lb 9.6 oz (79.7 kg)  07/08/18 175 lb (79.4 kg)     GEN: Well nourished, well developed in no acute distress HEENT: Normal NECK: No JVD; No carotid bruits LYMPHATICS: No lymphadenopathy CARDIAC: S1S2 noted,RRR, no murmurs, rubs, gallops RESPIRATORY:  Clear to auscultation without rales, wheezing or rhonchi  ABDOMEN: Soft, non-tender, non-distended, +bowel sounds, no guarding. EXTREMITIES: No  edema, No cyanosis, no clubbing MUSCULOSKELETAL:  No deformity  SKIN: Warm and dry NEUROLOGIC:  Alert and oriented x 3, non-focal PSYCHIATRIC:  Normal affect, good insight  ASSESSMENT:    1. Chest pain of uncertain etiology   2. Shortness of breath   3. Tobacco use   4. EKG abnormality   5. Mixed hyperlipidemia    PLAN:    Her chest pain is concerning given her risk factors we will going to pursue an ischemic evaluation on this patient.  An exercise nuclear stress test will be appropriate at this time.  I have educated patient about this test and she is agreeable to proceed.  In the meantime sublingual nitroglycerin prescription was sent, its protocol and 911 protocol explained and the patient vocalized understanding questions were answered to the patient's satisfaction.  With her shortness of breath walking history transthoracic echocardiogram will be ordered to assess LV/RV function and the other structural abnormalities.  Especially also in the setting of her EKG which suggest right atrial enlargement.  Tobacco use-the patient was counseled on tobacco cessation today for 5 minutes.  Counseling included reviewing the risks of smoking tobacco products, how it impacts the patient's current medical diagnoses and different strategies for quitting.  Pharmacotherapy to aid in tobacco cessation was not prescribed today. The patient coordinate with  primary care provider.  The patient was also advised to call  1-800-QUIT-NOW [phone]) for additional help with quitting smoking.  Hyperlipidemia-LDL 102 mildly elevated continue with diet modification.  The patient is in agreement with the above plan. The patient left the office in stable condition.  The patient will follow up in   Medication Adjustments/Labs and Tests Ordered: Current medicines are reviewed at length with the patient today.  Concerns regarding medicines are outlined above.  No orders of the defined types were placed in  this encounter.  No orders of the defined types were placed in this encounter.   There are no Patient Instructions on file for this visit.   Adopting a Healthy Lifestyle.  Know what a healthy weight is for you (roughly BMI <25) and aim to maintain this   Aim for 7+ servings of fruits and vegetables daily   65-80+ fluid ounces of water or unsweet tea for healthy  kidneys   Limit to max 1 drink of alcohol per day; avoid smoking/tobacco   Limit animal fats in diet for cholesterol and heart health - choose grass fed whenever available   Avoid highly processed foods, and foods high in saturated/trans fats   Aim for low stress - take time to unwind and care for your mental health   Aim for 150 min of moderate intensity exercise weekly for heart health, and weights twice weekly for bone health   Aim for 7-9 hours of sleep daily   When it comes to diets, agreement about the perfect plan isnt easy to find, even among the experts. Experts at the [employer] [employer] developed an idea known as the Healthy Eating Plate. Just imagine a plate divided into logical, healthy portions.   The emphasis is on diet quality:   Load up on vegetables and fruits - one-half of your plate: Aim for color and variety, and remember that potatoes dont count.   Go for whole grains - one-quarter of your plate: Whole wheat, barley, wheat berries, quinoa, oats, brown rice, and foods made with them. If you want pasta, go with whole wheat pasta.   Protein power - one-quarter of your plate: Fish, chicken, beans, and nuts are all healthy, versatile protein sources. Limit red meat.   The diet, however, does go beyond the plate, offering a few other suggestions.   Use healthy plant oils, such as olive, canola, soy, corn, sunflower and peanut. Check the labels, and avoid partially hydrogenated oil, which have unhealthy trans fats.   If youre thirsty, drink water. Coffee and tea are good in moderation,  but skip sugary drinks and limit milk and dairy products to one or two daily servings.   The type of carbohydrate in the diet is more important than the amount. Some sources of carbohydrates, such as vegetables, fruits, whole grains, and beans-are healthier than others.   Finally, stay active  Signed, Berniece Salines, DO  03/08/2019 11:24 AM    [facility]

## 2019-03-12 ENCOUNTER — Encounter (HOSPITAL_COMMUNITY): Payer: Medicare HMO

## 2019-03-16 ENCOUNTER — Telehealth (HOSPITAL_COMMUNITY): Payer: Self-pay

## 2019-03-16 ENCOUNTER — Other Ambulatory Visit (HOSPITAL_COMMUNITY)
Admission: RE | Admit: 2019-03-16 | Discharge: 2019-03-16 | Disposition: A | Discharge status: Home / Self Care | Payer: Medicare HMO | Source: Ambulatory Visit | Attending: Cardiology | Admitting: Cardiology

## 2019-03-16 DIAGNOSIS — Z01812 Encounter for preprocedural laboratory examination: Secondary | ICD-10-CM | POA: Diagnosis not present

## 2019-03-16 DIAGNOSIS — Z20822 Contact with and (suspected) exposure to covid-19: Secondary | ICD-10-CM | POA: Diagnosis not present

## 2019-03-16 LAB — SARS CORONAVIRUS 2 (TAT 6-24 HRS): SARS Coronavirus 2: NEGATIVE

## 2019-03-16 NOTE — Telephone Encounter (Signed)
Encounter complete. 

## 2019-03-17 ENCOUNTER — Ambulatory Visit (HOSPITAL_BASED_OUTPATIENT_CLINIC_OR_DEPARTMENT_OTHER)
Admission: RE | Admit: 2019-03-17 | Discharge: 2019-03-17 | Disposition: A | Discharge status: Home / Self Care | Payer: Medicare HMO | Source: Ambulatory Visit | Attending: Cardiology | Admitting: Cardiology

## 2019-03-17 ENCOUNTER — Telehealth (HOSPITAL_COMMUNITY): Payer: Self-pay | Admitting: *Deleted

## 2019-03-17 ENCOUNTER — Other Ambulatory Visit: Payer: Self-pay

## 2019-03-17 DIAGNOSIS — R0602 Shortness of breath: Secondary | ICD-10-CM | POA: Diagnosis not present

## 2019-03-17 NOTE — Progress Notes (Signed)
  Echocardiogram 2D Echocardiogram has been performed.  Jasmine Mclaughlin 03/17/2019, 10:30 AM

## 2019-03-17 NOTE — Telephone Encounter (Signed)
Close encounter 

## 2019-03-18 ENCOUNTER — Telehealth: Payer: Self-pay

## 2019-03-18 ENCOUNTER — Ambulatory Visit (HOSPITAL_COMMUNITY)
Admission: RE | Admit: 2019-03-18 | Discharge: 2019-03-18 | Disposition: A | Discharge status: Home / Self Care | Payer: Medicare HMO | Source: Ambulatory Visit | Attending: Cardiology | Admitting: Cardiology

## 2019-03-18 DIAGNOSIS — R072 Precordial pain: Secondary | ICD-10-CM | POA: Insufficient documentation

## 2019-03-18 LAB — MYOCARDIAL PERFUSION IMAGING
LV dias vol: 89 mL (ref 46–106)
LV sys vol: 34 mL
Peak HR: 80 {beats}/min
Rest HR: 64 {beats}/min
SDS: 0
SRS: 0
SSS: 0
TID: 1

## 2019-03-18 MED ORDER — TECHNETIUM TC 99M TETROFOSMIN IV KIT
10.7000 | PACK | Freq: Once | INTRAVENOUS | Status: AC | PRN
  Administered 2019-03-18: 10.7 via INTRAVENOUS
  Filled 2019-03-18: qty 11

## 2019-03-18 MED ORDER — TECHNETIUM TC 99M TETROFOSMIN IV KIT
31.6000 | PACK | Freq: Once | INTRAVENOUS | Status: AC | PRN
  Administered 2019-03-18: 31.6 via INTRAVENOUS
  Filled 2019-03-18: qty 32

## 2019-03-18 MED ORDER — REGADENOSON 0.4 MG/5ML IV SOLN
0.4000 mg | Freq: Once | INTRAVENOUS | Status: AC
  Administered 2019-03-18: 0.4 mg via INTRAVENOUS

## 2019-03-18 NOTE — Telephone Encounter (Signed)
The patient has been notified of the Echo result and verbalized understanding.  All questions (if any) were answered. Frederik Schmidt, RN 03/18/2019 11:33 AM

## 2019-03-18 NOTE — Telephone Encounter (Signed)
-----   Message from Berniece Salines, DO sent at 03/17/2019  6:00 PM EST ----- The echo showed that the heart is not fully relaxing like it should ( diastolic dysfunction) ,but otherwise normal. I will discuss it at the next office visit.

## 2019-03-24 ENCOUNTER — Other Ambulatory Visit: Payer: Self-pay | Admitting: Family Medicine

## 2019-03-24 DIAGNOSIS — F419 Anxiety disorder, unspecified: Secondary | ICD-10-CM

## 2019-05-11 ENCOUNTER — Telehealth: Payer: Self-pay | Admitting: Gastroenterology

## 2019-05-11 NOTE — Telephone Encounter (Signed)
The pt was advised that she will get a letter in July to set up colon at the hospital.  Pt verbalized understanding and I did confirm that recall is in Epic>

## 2019-05-11 NOTE — Telephone Encounter (Signed)
Pt called looking to schedule repeat colon at the hospital. Pls call her.

## 2019-05-26 DIAGNOSIS — H43392 Other vitreous opacities, left eye: Secondary | ICD-10-CM | POA: Diagnosis not present

## 2019-05-26 DIAGNOSIS — H2513 Age-related nuclear cataract, bilateral: Secondary | ICD-10-CM | POA: Diagnosis not present

## 2019-06-09 ENCOUNTER — Encounter: Payer: Self-pay | Admitting: Cardiology

## 2019-07-08 ENCOUNTER — Ambulatory Visit: Payer: Medicare HMO | Admitting: Cardiology

## 2019-07-30 ENCOUNTER — Ambulatory Visit: Payer: Medicare HMO | Admitting: Cardiology

## 2019-08-03 ENCOUNTER — Other Ambulatory Visit: Payer: Self-pay

## 2019-08-03 ENCOUNTER — Telehealth: Payer: Self-pay | Admitting: Gastroenterology

## 2019-08-03 DIAGNOSIS — Z86010 Personal history of colonic polyps: Secondary | ICD-10-CM

## 2019-08-03 NOTE — Telephone Encounter (Signed)
Colon scheduled fir 09/01/19 at [facility] with Dr Rush Landmark at 1130 am, pt instructed and medications reviewed.  Patient instructions available on My Chart.  Confirmed pt can view the information. COVID testing also provided.   Patient to call with any questions or concerns.

## 2019-08-04 DIAGNOSIS — Z0100 Encounter for examination of eyes and vision without abnormal findings: Secondary | ICD-10-CM | POA: Diagnosis not present

## 2019-08-17 ENCOUNTER — Encounter: Payer: Self-pay | Admitting: Family Medicine

## 2019-08-20 ENCOUNTER — Other Ambulatory Visit: Payer: Self-pay | Admitting: Family Medicine

## 2019-08-23 DIAGNOSIS — M545 Low back pain: Secondary | ICD-10-CM | POA: Diagnosis not present

## 2019-08-24 DIAGNOSIS — R0602 Shortness of breath: Secondary | ICD-10-CM | POA: Insufficient documentation

## 2019-08-24 DIAGNOSIS — A630 Anogenital (venereal) warts: Secondary | ICD-10-CM | POA: Insufficient documentation

## 2019-08-24 DIAGNOSIS — K635 Polyp of colon: Secondary | ICD-10-CM | POA: Insufficient documentation

## 2019-08-24 DIAGNOSIS — J189 Pneumonia, unspecified organism: Secondary | ICD-10-CM | POA: Insufficient documentation

## 2019-08-24 DIAGNOSIS — M810 Age-related osteoporosis without current pathological fracture: Secondary | ICD-10-CM | POA: Insufficient documentation

## 2019-08-24 DIAGNOSIS — T7840XA Allergy, unspecified, initial encounter: Secondary | ICD-10-CM | POA: Insufficient documentation

## 2019-08-24 DIAGNOSIS — M1990 Unspecified osteoarthritis, unspecified site: Secondary | ICD-10-CM | POA: Insufficient documentation

## 2019-08-24 DIAGNOSIS — F419 Anxiety disorder, unspecified: Secondary | ICD-10-CM | POA: Insufficient documentation

## 2019-08-25 ENCOUNTER — Ambulatory Visit: Payer: Medicare HMO | Admitting: Cardiology

## 2019-08-25 ENCOUNTER — Other Ambulatory Visit: Payer: Self-pay

## 2019-08-25 ENCOUNTER — Encounter: Payer: Self-pay | Admitting: Cardiology

## 2019-08-25 VITALS — BP 138/84 | HR 78 | Ht 65.0 in | Wt 194.0 lb

## 2019-08-25 DIAGNOSIS — R0789 Other chest pain: Secondary | ICD-10-CM | POA: Insufficient documentation

## 2019-08-25 DIAGNOSIS — R030 Elevated blood-pressure reading, without diagnosis of hypertension: Secondary | ICD-10-CM | POA: Insufficient documentation

## 2019-08-25 DIAGNOSIS — Z720 Tobacco use: Secondary | ICD-10-CM | POA: Insufficient documentation

## 2019-08-25 DIAGNOSIS — E669 Obesity, unspecified: Secondary | ICD-10-CM

## 2019-08-25 HISTORY — DX: Other chest pain: R07.89

## 2019-08-25 NOTE — Patient Instructions (Signed)

## 2019-08-25 NOTE — Progress Notes (Signed)
Cardiology Office Note:    Date:  08/25/2019   ID:  DELILIAH Mclaughlin, DOB [birth year], MRN [id number]  PCP:  Carollee Herter, Alferd Apa, DO  Cardiologist:  Berniece Salines, DO  Electrophysiologist:  None   Referring MD: Carollee Herter, Alferd Apa, *   Chief Complaint  Patient presents with  . Follow-up    History of Present Illness:    Jasmine Mclaughlin is a 69 y.o. female with a hx of COPD, current smoker, anxiety was initially referred by her PCP due to intermittent left-sided chest pain.  I saw the patient in March 2021 at that time I recommended she undergo a nuclear stress test and an echocardiogram.  She did get the nuclear stress test which was reported as low risk.  In addition her echocardiogram also was normal.  She is here today for follow-up visit.  She has been experiencing a lot of stress recently in her life.  She has had intermittent chest pain.  But had not used any nitroglycerin and states that they are very seldom.  No other complaints at this time.  Past Medical History:  Diagnosis Date  . Allergy   . Anxiety   . Arthritis    degenerative back, joint disturbance "everywhere "  . Asthma   . Chest tightness 10/27/2013  . Colon polyps   . COPD (chronic obstructive pulmonary disease) ([place])   . Generalized anxiety disorder 11/13/2012  . Genital warts   . Obesity (BMI 30-39.9) 10/14/2012  . Osteoporosis   . Pneumonia   . Shortness of breath    pt./ reports that she is out of shape  . Sinusitis, acute 10/27/2013  . Tobacco abuse 09/28/2010  . Wheezing 10/27/2013    Past Surgical History:  Procedure Laterality Date  . ANTERIOR FUSION LUMBAR SPINE  05/06/2012  . ANTERIOR LAT LUMBAR FUSION N/A 05/06/2012   Procedure: ANTERIOR LATERAL LUMBAR FUSION 3 LEVELS;  Surgeon: Sinclair Ship, MD;  Location: [place];  Service: Orthopedics;  Laterality: N/A;  Lateral interbody fusion, lumbar 2-3,lumbar 3-4, lumbar 4-5 with instrumentation and allograft.  . COLONOSCOPY    . COLONOSCOPY WITH  PROPOFOL N/A 12/15/2017   Procedure: COLONOSCOPY WITH PROPOFOL;  Surgeon: Rush Landmark Telford Nab., MD;  Location: [place];  Service: Gastroenterology;  Laterality: N/A;  . COLONOSCOPY WITH PROPOFOL N/A 07/08/2018   Procedure: COLONOSCOPY WITH PROPOFOL;  Surgeon: Rush Landmark Telford Nab., MD;  Location: [place];  Service: Gastroenterology;  Laterality: N/A;  . KNEE ARTHROSCOPY Right     R knee 01/2012, for ligament tears  . POLYPECTOMY  07/08/2018   Procedure: POLYPECTOMY;  Surgeon: Mansouraty, Telford Nab., MD;  Location: [place];  Service: Gastroenterology;;  . TONSILLECTOMY    . VAGINAL DELIVERY     x1    Current Medications: Current Meds  Medication Sig  . acetaminophen (TYLENOL) 500 MG tablet Take 1,000 mg by mouth 2 (two) times daily as needed for mild pain or headache.   . albuterol (PROVENTIL HFA;VENTOLIN HFA) 108 (90 Base) MCG/ACT inhaler Inhale 2 puffs into the lungs every 6 (six) hours as needed for wheezing or shortness of breath.  Jasmine Mclaughlin alendronate (FOSAMAX) 70 MG tablet TAKE 1 TABLET BY MOUTH EVERY 7 DAYS. TAKE WITH A FULL GLASS OF WATER ON AN EMPTY STOMACH.  . citalopram (CELEXA) 20 MG tablet Take 1 tablet (20 mg total) by mouth daily.  . nitroGLYCERIN (NITROSTAT) 0.4 MG SL tablet Place 1 tablet (0.4 mg total) under the tongue every 5 (five) minutes as  needed.  Jasmine Mclaughlin tiZANidine (ZANAFLEX) 4 MG capsule Take 1 capsule (4 mg total) by mouth 3 (three) times daily as needed for muscle spasms.  Jasmine Mclaughlin ULTRAM 50 MG tablet Take 50-100 mg by mouth every 6 (six) hours as needed.     Allergies:   Patient has no known allergies.   Social History   Socioeconomic History  . Marital status: Single    Spouse name: Not on file  . Number of children: 1  . Years of education: Not on file  . Highest education level: Not on file  Occupational History  . 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 Smoking status: Current Every Day Smoker    Packs/day:  1.00    Years: 55.00    Pack years: 55.00    Types: Cigarettes  . Smokeless tobacco: Never Used  . Tobacco comment: Down to 1 cigarette daily  Vaping Use  . Vaping Use: Never used  Substance and Sexual Activity  . Alcohol use: Yes    Alcohol/week: 0.0 standard drinks    Comment: "sporatic"- wine, beer, mixed drink  . Drug use: No  . Sexual activity: Not Currently    Partners: Male  Other Topics Concern  . Not on file  Social History Narrative   1 caffeine drinks daily   No exercise   Social Determinants of Health   Financial Resource Strain:   . Difficulty of Paying Living Expenses:   Food Insecurity:   . Worried About Charity fundraiser in the Last Year:   . Arboriculturist in the Last Year:   Transportation Needs:   . Film/video editor (Medical):   Jasmine Mclaughlin Lack of Transportation (Non-Medical):   Physical Activity:   . Days of Exercise per Week:   . Minutes of Exercise per Session:   Stress:   . Feeling of Stress :   Social Connections:   . Frequency of Communication with Friends and Family:   . Frequency of Social Gatherings with Friends and Family:   . Attends Religious Services:   . Active Member of Clubs or Organizations:   . Attends Archivist Meetings:   Jasmine Mclaughlin Marital Status:      Family History: The patient's family history includes Arthritis in her father and mother; Asthma in her daughter and maternal grandmother; Breast cancer in her maternal grandmother; Colon cancer (age of onset: 9) in her father; Heart disease (age of onset: 4) in her father; Stroke in her father. There is no history of Esophageal cancer, Ulcerative colitis, or Stomach cancer.  ROS:   Review of Systems  Constitution: Negative for decreased appetite, fever and weight gain.  HENT: Negative for congestion, ear discharge, hoarse voice and sore throat.   Eyes: Negative for discharge, redness, vision loss in right eye and visual halos.  Cardiovascular: Negative for chest pain,  dyspnea on exertion, leg swelling, orthopnea and palpitations.  Respiratory: Negative for cough, hemoptysis, shortness of breath and snoring.   Endocrine: Negative for heat intolerance and polyphagia.  Hematologic/Lymphatic: Negative for bleeding problem. Does not bruise/bleed easily.  Skin: Negative for flushing, nail changes, rash and suspicious lesions.  Musculoskeletal: Negative for arthritis, joint pain, muscle cramps, myalgias, neck pain and stiffness.  Gastrointestinal: Negative for abdominal pain, bowel incontinence, diarrhea and excessive appetite.  Genitourinary: Negative for decreased libido, genital sores and incomplete emptying.  Neurological: Negative for brief paralysis, focal weakness, headaches and loss of balance.  Psychiatric/Behavioral: Negative for altered mental status, depression and suicidal ideas.  Allergic/Immunologic: Negative for HIV exposure and persistent infections.    EKGs/Labs/Other Studies Reviewed:    The following studies were reviewed today:   EKG:  The ekg ordered today demonstrates   Nuclear stress test Impression: 1. Normal myocardial perfusion imaging study without evidence of ischemia or infarction.  2. Normal LVEF, 62%. 3. Brief ectopic atrial rhythm (6 beat duration) captured on baseline ECGs. 4. This is a low-risk study.    Echo IMPRESSIONS  1. Left ventricular ejection fraction, by estimation, is 60 to 65%. The  left ventricle has normal function. The left ventricle has no regional  wall motion abnormalities. There is mild concentric left ventricular  hypertrophy. Left ventricular diastolic  parameters are consistent with Grade II diastolic dysfunction  (pseudonormalization). The average left ventricular global longitudinal  strain is -16.6 %.  2. Right ventricular systolic function is normal. The right ventricular  size is normal. There is normal pulmonary artery systolic pressure.  3. The mitral valve is normal in structure.  Trivial mitral valve  regurgitation. No evidence of mitral stenosis.  4. The aortic valve is tricuspid. Aortic valve regurgitation is not  visualized. No aortic stenosis is present.  5. The inferior vena cava is normal in size with greater than 50%  respiratory variability, suggesting right atrial pressure of 3 mmHg.   FINDINGS  Left Ventricle: Left ventricular ejection fraction, by estimation, is 60  to 65%. The left ventricle has normal function. The left ventricle has no  regional wall motion abnormalities. The average left ventricular global  longitudinal strain is -16.6 %.  The left ventricular internal cavity size was normal in size. There is  mild concentric left ventricular hypertrophy. Left ventricular diastolic  parameters are consistent with Grade II diastolic dysfunction  (pseudonormalization).   Right Ventricle: The right ventricular size is normal. No increase in  right ventricular wall thickness. Right ventricular systolic function is  normal. There is normal pulmonary artery systolic pressure. The tricuspid  regurgitant velocity is 1.30 m/s, and  with an assumed right atrial pressure of 3 mmHg, the estimated right  ventricular systolic pressure is 9.8 mmHg.   Left Atrium: Left atrial size was normal in size.   Right Atrium: Right atrial size was normal in size.   Pericardium: Trivial pericardial effusion is present. The pericardial  effusion is posterior to the left ventricle.   Mitral Valve: The mitral valve is normal in structure. Normal mobility of  the mitral valve leaflets. Trivial mitral valve regurgitation. No evidence  of mitral valve stenosis.   Tricuspid Valve: The tricuspid valve is normal in structure. Tricuspid  valve regurgitation is not demonstrated. No evidence of tricuspid  stenosis.   Aortic Valve: The aortic valve is tricuspid. Aortic valve regurgitation is  not visualized. No aortic stenosis is present.   Pulmonic Valve: The pulmonic  valve was normal in structure. Pulmonic valve  regurgitation is not visualized. No evidence of pulmonic stenosis.   Aorta: The aortic root and ascending aorta are structurally normal, with  no evidence of dilitation.   Venous: A normal flow pattern is recorded from the right upper pulmonary  vein. The inferior vena cava is normal in size with greater than 50%  respiratory variability, suggesting right atrial pressure of 3 mmHg.   IAS/Shunts: No atrial level shunt detected by color flow Doppler.    Recent Labs: 09/22/2018: ALT 12; BUN 17; Creatinine, Ser 0.75; Hemoglobin 14.1; Platelets 373.0; Potassium 4.7;  Sodium 140  Recent Lipid Panel    Component Value Date/Time   CHOL 178 09/22/2018 1435   TRIG 122.0 09/22/2018 1435   HDL 51.50 09/22/2018 1435   CHOLHDL 3 09/22/2018 1435   VLDL 24.4 09/22/2018 1435   LDLCALC 102 (H) 09/22/2018 1435    Physical Exam:    VS:  BP 138/84 (BP Location: Right Arm, Patient Position: Sitting, Cuff Size: Normal)   Pulse 78   Ht 5\' 5"  (1.651 m)   Wt 194 lb (88 kg)   SpO2 96%   BMI 32.28 kg/m     Wt Readings from Last 3 Encounters:  08/25/19 194 lb (88 kg)  03/18/19 179 lb (81.2 kg)  03/08/19 179 lb 1.3 oz (81.2 kg)     GEN: Well nourished, well developed in no acute distress HEENT: Normal NECK: No JVD; No carotid bruits LYMPHATICS: No lymphadenopathy CARDIAC: S1S2 noted,RRR, no murmurs, rubs, gallops RESPIRATORY:  Clear to auscultation without rales, wheezing or rhonchi  ABDOMEN: Soft, non-tender, non-distended, +bowel sounds, no guarding. EXTREMITIES: No edema, No cyanosis, no clubbing MUSCULOSKELETAL:  No deformity  SKIN: Warm and dry NEUROLOGIC:  Alert and oriented x 3, non-focal PSYCHIATRIC:  Normal affect, good insight  ASSESSMENT:    1. Atypical chest pain   2. Elevated blood pressure reading   3. Tobacco use   4. Obesity (BMI 30-39.9)    PLAN:     Her blood pressure in the office today was manually repeated by me show  138/84 mmHg right arm and 132/80 mmHg left arm.  I did educate the patient her blood pressure slightly become elevated.  We discussed the limited use of salt as she does use salt.  I have also advised the patient to get a blood pressure cuff or she can take her blood pressure daily and report these readings to me after consistent blood pressure 2 weeks.  I like to review them and reassess the need for adding additional antihypertensive medication. Her chest pain sounds atypical and very seldom we will continue to monitor.  If this persist we will consider the need for another diagnostic testing.  She has a follow-up in September with her PCP at which time she intends to get lipid profile done.  Again reviewed and once this is done.  Her last lipid profile was in September 2020.  Which showed HDL 51, LDL 102, total cholesterol 178 and triglyceride 122.  For now no need to change anything.  Smoking tobacco cessation advised.  The patient understands the need to lose weight with diet and exercise. We have discussed specific strategies for this.  The patient is in agreement with the above plan. The patient left the office in stable condition.  The patient will follow up in   Medication Adjustments/Labs and Tests Ordered: Current medicines are reviewed at length with the patient today.  Concerns regarding medicines are outlined above.  No orders of the defined types were placed in this encounter.  No orders of the defined types were placed in this encounter.   Patient Instructions  Medication Instructions:  No medication changes. *If you need a refill on your cardiac medications before your next appointment, please call your pharmacy*   Lab Work: None ordered If you have labs (blood work) drawn today and your tests are completely normal, you will receive your results only by: Jasmine Mclaughlin MyChart Message (if you have MyChart) OR . A paper copy in the mail If you have any lab test that is abnormal  or we  need to change your treatment, we will call you to review the results.   Testing/Procedures: None ordered   Follow-Up: At [facility], you and your health needs are our priority.  As part of our continuing mission to provide you with exceptional heart care, we have created designated Provider Care Teams.  These Care Teams include your primary Cardiologist (physician) and Advanced Practice Providers (APPs -  Physician Assistants and Nurse Practitioners) who all work together to provide you with the care you need, when you need it.  We recommend signing up for the patient portal called "MyChart".  Sign up information is provided on this After Visit Summary.  MyChart is used to connect with patients for Virtual Visits (Telemedicine).  Patients are able to view lab/test results, encounter notes, upcoming appointments, etc.  Non-urgent messages can be sent to your provider as well.   To learn more about what you can do with MyChart, go to [email or web address].    Your next appointment:   6 month(s)  The format for your next appointment:   In Person  Provider:   Berniece Salines, DO   Other Instructions NA     Adopting a Healthy Lifestyle.  Know what a healthy weight is for you (roughly BMI <25) and aim to maintain this   Aim for 7+ servings of fruits and vegetables daily   65-80+ fluid ounces of water or unsweet tea for healthy kidneys   Limit to max 1 drink of alcohol per day; avoid smoking/tobacco   Limit animal fats in diet for cholesterol and heart health - choose grass fed whenever available   Avoid highly processed foods, and foods high in saturated/trans fats   Aim for low stress - take time to unwind and care for your mental health   Aim for 150 min of moderate intensity exercise weekly for heart health, and weights twice weekly for bone health   Aim for 7-9 hours of sleep daily   When it comes to diets, agreement about the perfect plan isnt easy to find, even  among the experts. Experts at the [employer] developed an idea known as the Healthy Eating Plate. Just imagine a plate divided into logical, healthy portions.   The emphasis is on diet quality:   Load up on vegetables and fruits - one-half of your plate: Aim for color and variety, and remember that potatoes dont count.   Go for whole grains - one-quarter of your plate: Whole wheat, barley, wheat berries, quinoa, oats, brown rice, and foods made with them. If you want pasta, go with whole wheat pasta.   Protein power - one-quarter of your plate: Fish, chicken, beans, and nuts are all healthy, versatile protein sources. Limit red meat.   The diet, however, does go beyond the plate, offering a few other suggestions.   Use healthy plant oils, such as olive, canola, soy, corn, sunflower and peanut. Check the labels, and avoid partially hydrogenated oil, which have unhealthy trans fats.   If youre thirsty, drink water. Coffee and tea are good in moderation, but skip sugary drinks and limit milk and dairy products to one or two daily servings.   The type of carbohydrate in the diet is more important than the amount. Some sources of carbohydrates, such as vegetables, fruits, whole grains, and beans-are healthier than others.   Finally, stay active  Signed, Berniece Salines, DO  08/25/2019 12:14 PM    [facility]

## 2019-08-28 ENCOUNTER — Other Ambulatory Visit (HOSPITAL_COMMUNITY)
Admission: RE | Admit: 2019-08-28 | Discharge: 2019-08-28 | Disposition: A | Discharge status: Home / Self Care | Payer: Medicare HMO | Source: Ambulatory Visit | Attending: Gastroenterology | Admitting: Gastroenterology

## 2019-08-28 DIAGNOSIS — Z01812 Encounter for preprocedural laboratory examination: Secondary | ICD-10-CM | POA: Insufficient documentation

## 2019-08-28 DIAGNOSIS — Z20822 Contact with and (suspected) exposure to covid-19: Secondary | ICD-10-CM | POA: Diagnosis not present

## 2019-08-28 LAB — SARS CORONAVIRUS 2 (TAT 6-24 HRS): SARS Coronavirus 2: NEGATIVE

## 2019-09-01 ENCOUNTER — Ambulatory Visit (HOSPITAL_COMMUNITY)
Admission: RE | Admit: 2019-09-01 | Discharge: 2019-09-01 | Disposition: A | Discharge status: Home / Self Care | Payer: Medicare HMO | Attending: Gastroenterology | Admitting: Gastroenterology

## 2019-09-01 ENCOUNTER — Encounter (HOSPITAL_COMMUNITY): Payer: Self-pay | Admitting: Gastroenterology

## 2019-09-01 ENCOUNTER — Other Ambulatory Visit: Payer: Self-pay

## 2019-09-01 ENCOUNTER — Encounter (HOSPITAL_COMMUNITY)
Admission: RE | Disposition: A | Discharge status: Home / Self Care | Payer: Self-pay | Source: Home / Self Care | Attending: Gastroenterology

## 2019-09-01 ENCOUNTER — Ambulatory Visit (HOSPITAL_COMMUNITY): Payer: Medicare HMO | Admitting: Certified Registered Nurse Anesthetist

## 2019-09-01 DIAGNOSIS — F17210 Nicotine dependence, cigarettes, uncomplicated: Secondary | ICD-10-CM | POA: Insufficient documentation

## 2019-09-01 DIAGNOSIS — Z860100 Personal history of colon polyps, unspecified: Secondary | ICD-10-CM

## 2019-09-01 DIAGNOSIS — K641 Second degree hemorrhoids: Secondary | ICD-10-CM | POA: Insufficient documentation

## 2019-09-01 DIAGNOSIS — J449 Chronic obstructive pulmonary disease, unspecified: Secondary | ICD-10-CM | POA: Diagnosis not present

## 2019-09-01 DIAGNOSIS — E669 Obesity, unspecified: Secondary | ICD-10-CM | POA: Insufficient documentation

## 2019-09-01 DIAGNOSIS — K635 Polyp of colon: Secondary | ICD-10-CM | POA: Diagnosis not present

## 2019-09-01 DIAGNOSIS — K5730 Diverticulosis of large intestine without perforation or abscess without bleeding: Secondary | ICD-10-CM | POA: Diagnosis not present

## 2019-09-01 DIAGNOSIS — M545 Low back pain, unspecified: Secondary | ICD-10-CM

## 2019-09-01 DIAGNOSIS — K644 Residual hemorrhoidal skin tags: Secondary | ICD-10-CM | POA: Insufficient documentation

## 2019-09-01 DIAGNOSIS — Z09 Encounter for follow-up examination after completed treatment for conditions other than malignant neoplasm: Secondary | ICD-10-CM | POA: Diagnosis present

## 2019-09-01 DIAGNOSIS — R69 Illness, unspecified: Secondary | ICD-10-CM | POA: Diagnosis not present

## 2019-09-01 DIAGNOSIS — Z6832 Body mass index (BMI) 32.0-32.9, adult: Secondary | ICD-10-CM | POA: Insufficient documentation

## 2019-09-01 HISTORY — PX: POLYPECTOMY: SHX5525

## 2019-09-01 HISTORY — PX: COLONOSCOPY WITH PROPOFOL: SHX5780

## 2019-09-01 SURGERY — COLONOSCOPY WITH PROPOFOL
Anesthesia: Monitor Anesthesia Care

## 2019-09-01 MED ORDER — PROPOFOL 10 MG/ML IV BOLUS
INTRAVENOUS | Status: AC
  Filled 2019-09-01: qty 20

## 2019-09-01 MED ORDER — PROPOFOL 10 MG/ML IV BOLUS
INTRAVENOUS | Status: DC | PRN
  Administered 2019-09-01: 20 mg via INTRAVENOUS
  Administered 2019-09-01: 10 mg via INTRAVENOUS
  Administered 2019-09-01: 20 mg via INTRAVENOUS

## 2019-09-01 MED ORDER — CIPROFLOXACIN HCL 500 MG PO TABS: 500.0000 mg | ORAL_TABLET | Freq: Two times a day (BID) | ORAL | 0 refills | Status: AC

## 2019-09-01 MED ORDER — SODIUM CHLORIDE 0.9 % IV SOLN: INTRAVENOUS | Status: DC

## 2019-09-01 MED ORDER — METRONIDAZOLE 500 MG PO TABS: 500.0000 mg | ORAL_TABLET | Freq: Two times a day (BID) | ORAL | 0 refills | Status: AC

## 2019-09-01 MED ORDER — PROPOFOL 500 MG/50ML IV EMUL
INTRAVENOUS | Status: DC | PRN
  Administered 2019-09-01: 120 ug/kg/min via INTRAVENOUS

## 2019-09-01 MED ORDER — LACTATED RINGERS IV SOLN
INTRAVENOUS | Status: DC
  Administered 2019-09-01: 1000 mL via INTRAVENOUS

## 2019-09-01 SURGICAL SUPPLY — 22 items

## 2019-09-01 NOTE — Transfer of Care (Signed)
Immediate Anesthesia Transfer of Care Note  Patient: Jasmine Mclaughlin  Procedure(s) Performed: COLONOSCOPY WITH PROPOFOL (N/A ) POLYPECTOMY  Patient Location: Endoscopy Unit  Anesthesia Type:MAC  Level of Consciousness: awake, alert , oriented and patient cooperative  Airway & Oxygen Therapy: Patient Spontanous Breathing and Patient connected to face mask oxygen  Post-op Assessment: Report given to RN and Post -op Vital signs reviewed and stable  Post vital signs: Reviewed and stable  Last Vitals:  Vitals Value Taken Time  BP 110/84   Temp    Pulse 67 09/01/19 1239  Resp 18 09/01/19 1239  SpO2 97 % 09/01/19 1239  Vitals shown include unvalidated device data.  Last Pain:  Vitals:   09/01/19 1028  TempSrc: Oral  PainSc: 0-No pain         Complications: No complications documented.

## 2019-09-01 NOTE — H&P (Signed)
GASTROENTEROLOGY PROCEDURE H&P NOTE   Primary Care Physician: Ann Held, DO  HPI: Jasmine Mclaughlin is a 69 y.o. female who presents for Colonoscopy for follow up of large Adenoma resection in 2019 and follow up showing recurrence in 2020.  Past Medical History:  Diagnosis Date  . Allergy   . Anxiety   . Arthritis    degenerative back, joint disturbance "everywhere "  . Asthma   . Chest tightness 10/27/2013  . Colon polyps   . COPD (chronic obstructive pulmonary disease) ([place])   . Generalized anxiety disorder 11/13/2012  . Genital warts   . Obesity (BMI 30-39.9) 10/14/2012  . Osteoporosis   . Pneumonia   . Shortness of breath    pt./ reports that she is out of shape  . Sinusitis, acute 10/27/2013  . Tobacco abuse 09/28/2010  . Wheezing 10/27/2013   Past Surgical History:  Procedure Laterality Date  . ANTERIOR FUSION LUMBAR SPINE  05/06/2012  . ANTERIOR LAT LUMBAR FUSION N/A 05/06/2012   Procedure: ANTERIOR LATERAL LUMBAR FUSION 3 LEVELS;  Surgeon: Sinclair Ship, MD;  Location: [place];  Service: Orthopedics;  Laterality: N/A;  Lateral interbody fusion, lumbar 2-3,lumbar 3-4, lumbar 4-5 with instrumentation and allograft.  . COLONOSCOPY    . COLONOSCOPY WITH PROPOFOL N/A 12/15/2017   Procedure: COLONOSCOPY WITH PROPOFOL;  Surgeon: Rush Landmark Telford Nab., MD;  Location: [place];  Service: Gastroenterology;  Laterality: N/A;  . COLONOSCOPY WITH PROPOFOL N/A 07/08/2018   Procedure: COLONOSCOPY WITH PROPOFOL;  Surgeon: Rush Landmark Telford Nab., MD;  Location: [place];  Service: Gastroenterology;  Laterality: N/A;  . KNEE ARTHROSCOPY Right     R knee 01/2012, for ligament tears  . POLYPECTOMY  07/08/2018   Procedure: POLYPECTOMY;  Surgeon: Mansouraty, Telford Nab., MD;  Location: [place];  Service: Gastroenterology;;  . TONSILLECTOMY    . VAGINAL DELIVERY     x1   Current Facility-Administered Medications  Medication Dose Route Frequency Provider Last Rate  Last Admin  . 0.9 %  sodium chloride infusion   Intravenous Continuous Mansouraty, Telford Nab., MD      . lactated ringers infusion   Intravenous Continuous Mansouraty, Telford Nab., MD 10 mL/hr at 09/01/19 1045 1,000 mL at 09/01/19 1045   No Known Allergies Family History  Problem Relation Age of Onset  . Arthritis Mother   . Arthritis Father   . Stroke Father   . Colon cancer Father 16  . Heart disease Father 65       chf  . Breast cancer Maternal Grandmother   . Asthma Maternal Grandmother   . Asthma Daughter   . Esophageal cancer Neg Hx   . Ulcerative colitis Neg Hx   . Stomach cancer Neg Hx    Social History   Socioeconomic History  . Marital status: Single    Spouse name: Not on file  . Number of children: 1  . Years of education: Not on file  . Highest education level: Not on file  Occupational History  . 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 Smoking status: Current Every Day Smoker    Packs/day: 1.00    Years: 55.00    Pack years: 55.00    Types: Cigarettes  . Smokeless tobacco: Never Used  . Tobacco comment: Down to 1 cigarette daily  Vaping Use  . Vaping Use: Never used  Substance and Sexual Activity  . Alcohol use: Yes  Alcohol/week: 0.0 standard drinks    Comment: "sporatic"- wine, beer, mixed drink  . Drug use: No  . Sexual activity: Not Currently    Partners: Male  Other Topics Concern  . Not on file  Social History Narrative   1 caffeine drinks daily   No exercise   Social Determinants of Health   Financial Resource Strain:   . Difficulty of Paying Living Expenses: Not on file  Food Insecurity:   . Worried About Charity fundraiser in the Last Year: Not on file  . Ran Out of Food in the Last Year: Not on file  Transportation Needs:   . Lack of Transportation (Medical): Not on file  . Lack of Transportation (Non-Medical): Not on file  Physical Activity:   . Days of Exercise per Week: Not on  file  . Minutes of Exercise per Session: Not on file  Stress:   . Feeling of Stress : Not on file  Social Connections:   . Frequency of Communication with Friends and Family: Not on file  . Frequency of Social Gatherings with Friends and Family: Not on file  . Attends Religious Services: Not on file  . Active Member of Clubs or Organizations: Not on file  . Attends Archivist Meetings: Not on file  . Marital Status: Not on file  Intimate Partner Violence:   . Fear of Current or Ex-Partner: Not on file  . Emotionally Abused: Not on file  . Physically Abused: Not on file  . Sexually Abused: Not on file    Physical Exam: Vital signs in last 24 hours: Temp:  [98.4 F (36.9 C)] 98.4 F (36.9 C) (08/25 1028) Pulse Rate:  [57] 57 (08/25 1028) Resp:  [18] 18 (08/25 1028) BP: (129)/(61) 129/61 (08/25 1028) SpO2:  [99 %] 99 % (08/25 1028) Weight:  [88 kg] 88 kg (08/25 1028)   GEN: NAD EYE: Sclerae anicteric ENT: MMM CV: Non-tachycardic GI: Soft, NT/ND NEURO:  Alert & Oriented x 3  Lab Results: No results for input(s): WBC, HGB, HCT, PLT in the last 72 hours. BMET No results for input(s): NA, K, CL, CO2, GLUCOSE, BUN, CREATININE, CALCIUM in the last 72 hours. LFT No results for input(s): PROT, ALBUMIN, AST, ALT, ALKPHOS, BILITOT, BILIDIR, IBILI in the last 72 hours. PT/INR No results for input(s): LABPROT, INR in the last 72 hours.   Impression / Plan: This is a 69 y.o.female who presents for Colonoscopy for follow up of large Adenoma resection in 2019 and follow up showing recurrence in 2020.  The risks and benefits of endoscopic evaluation were discussed with the patient; these include but are not limited to the risk of perforation, infection, bleeding, missed lesions, lack of diagnosis, severe illness requiring hospitalization, as well as anesthesia and sedation related illnesses.  The patient is agreeable to proceed.    Justice Britain, MD [place]  Gastroenterology Advanced Endoscopy Office # [phone]

## 2019-09-01 NOTE — Discharge Instructions (Signed)

## 2019-09-01 NOTE — Op Note (Signed)
[facility] Patient Name: Jasmine Mclaughlin Procedure Date: 09/01/2019 MRN: [id number] Attending MD: Justice Britain , MD Date of Birth: March 23, 1950 CSN: [id number] Age: 69 Admit Type: Outpatient Procedure:                Colonoscopy Indications:              Surveillance: Piecemeal removal of large sessile                            adenoma last colonoscopy (< 3 yrs) Providers:                Justice Britain, MD, Jeanella Cara, RN,                            Elspeth Cho Tech., Technician, Ladona Ridgel,                            Technician Referring MD:             Lajuan Lines. Hilarie Fredrickson, MD, Rosalita Chessman Medicines:                Monitored Anesthesia Care Complications:            No immediate complications. Estimated Blood Loss:     Estimated blood loss was minimal. Procedure:                Pre-Anesthesia Assessment:                           - Prior to the procedure, a History and Physical                            was performed, and patient medications and                            allergies were reviewed. The patient's tolerance of                            previous anesthesia was also reviewed. The risks                            and benefits of the procedure and the sedation                            options and risks were discussed with the patient.                            All questions were answered, and informed consent                            was obtained. Prior Anticoagulants: The patient has                            taken no previous anticoagulant or antiplatelet  agents. ASA Grade Assessment: II - A patient with                            mild systemic disease. After reviewing the risks                            and benefits, the patient was deemed in                            satisfactory condition to undergo the procedure.                           After obtaining informed consent, the colonoscope                             was passed under direct vision. Throughout the                            procedure, the patient's blood pressure, pulse, and                            oxygen saturations were monitored continuously. The                            PCF-[id number] ([id number] Olympus pediatric colonscope                            was introduced through the anus and advanced to the                            the cecum, identified by appendiceal orifice and                            ileocecal valve. The colonoscopy was performed                            without difficulty. The patient tolerated the                            procedure. The quality of the bowel preparation was                            adequate. The ileocecal valve, appendiceal orifice,                            and rectum were photographed. Scope In: 12:05:01 PM Scope Out: 12:32:40 PM Scope Withdrawal Time: 0 hours 22 minutes 54 seconds  Total Procedure Duration: 0 hours 27 minutes 39 seconds  Findings:      The digital rectal exam findings include hemorrhoids. Pertinent       negatives include no palpable rectal lesions.      No evidence of recurrent polypoid at prior resection site.      Multiple small-mouthed diverticula were found in the recto-sigmoid       colon, sigmoid  colon, descending colon and ascending colon.      A 4 mm polyp was found in the descending colon. The polyp was sessile       and was directly adjacent and invading into a diverticulum. The polyp       was removed with a cold snare. Resection and retrieval were complete.       Oozing noted initially and subsided.      Normal mucosa was found in the entire colon otherwise.      Non-bleeding non-thrombosed external and internal hemorrhoids were found       during retroflexion, during perianal exam and during digital exam. The       hemorrhoids were Grade II (internal hemorrhoids that prolapse but reduce       spontaneously). Impression:               -  Hemorrhoids found on digital rectal exam.                           - No evidence of recurrent polypoid tissue at prior                            resection site.                           - Diverticulosis in the recto-sigmoid colon, in the                            sigmoid colon, in the descending colon and in the                            ascending colon.                           - One 4 mm polyp in the descending colon                            adjacent/invading a diverticulum, removed with a                            cold snare. Resected and retrieved.                           - Normal mucosa in the entire examined colon                            otherwise.                           - Non-bleeding non-thrombosed external and internal                            hemorrhoids. Moderate Sedation:      Not Applicable - Patient had care per Anesthesia. Recommendation:           - The patient will be observed post-procedure,  until all discharge criteria are met.                           - Discharge patient to home.                           - Patient has a contact number available for                            emergencies. The signs and symptoms of potential                            delayed complications were discussed with the                            patient. Return to normal activities tomorrow.                            Written discharge instructions were provided to the                            patient.                           - High fiber diet.                           - Ciprofloxacin 500 mg twice daily + Flagyl 500 mg                            twice daily. for 5-days total.                           - Await pathology results.                           - Repeat colonoscopy in 3 years for surveillance.                           - The findings and recommendations were discussed                            with the patient.                            - The findings and recommendations were discussed                            with the patient's family. Procedure Code(s):        --- Professional ---                           [id number], Colonoscopy, flexible; with removal of                            tumor(s), polyp(s), or other lesion(s) by  snare                            technique Diagnosis Code(s):        --- Professional ---                           Z86.010, Personal history of colonic polyps                           K64.1, Second degree hemorrhoids                           K63.5, Polyp of colon                           K57.30, Diverticulosis of large intestine without                            perforation or abscess without bleeding CPT copyright 2019 American Medical Association. All rights reserved. The codes documented in this report are preliminary and upon coder review may  be revised to meet current compliance requirements. Justice Britain, MD 09/01/2019 12:57:56 PM Number of Addenda: 0

## 2019-09-01 NOTE — Anesthesia Preprocedure Evaluation (Signed)
Anesthesia Evaluation  Patient identified by MRN, date of birth, ID band Patient awake    Reviewed: Allergy & Precautions, NPO status , Patient's Chart, lab work & pertinent test results  Airway Mallampati: I       Dental no notable dental hx. (+) Teeth Intact   Pulmonary pneumonia, COPD, Current Smoker and Patient abstained from smoking.,    breath sounds clear to auscultation       Cardiovascular Normal cardiovascular exam Rhythm:Regular Rate:Normal  History noted. CG   Neuro/Psych PSYCHIATRIC DISORDERS Anxiety    GI/Hepatic Neg liver ROS, History noted. CG   Endo/Other  negative endocrine ROS  Renal/GU negative Renal ROS     Musculoskeletal   Abdominal (+) + obese,   Peds  Hematology   Anesthesia Other Findings   Reproductive/Obstetrics                             Anesthesia Physical  Anesthesia Plan  ASA: III  Anesthesia Plan: MAC   Post-op Pain Management:    Induction: Intravenous  PONV Risk Score and Plan: 1 and Treatment may vary due to age or medical condition  Airway Management Planned: Nasal Cannula and Simple Face Mask  Additional Equipment: None  Intra-op Plan:   Post-operative Plan:   Informed Consent: I have reviewed the patients History and Physical, chart, labs and discussed the procedure including the risks, benefits and alternatives for the proposed anesthesia with the patient or authorized representative who has indicated his/her understanding and acceptance.     Dental advisory given  Plan Discussed with: CRNA  Anesthesia Plan Comments:         Anesthesia Quick Evaluation

## 2019-09-02 ENCOUNTER — Other Ambulatory Visit: Payer: Self-pay | Admitting: Orthopedic Surgery

## 2019-09-02 DIAGNOSIS — M545 Low back pain, unspecified: Secondary | ICD-10-CM

## 2019-09-02 DIAGNOSIS — S32009K Unspecified fracture of unspecified lumbar vertebra, subsequent encounter for fracture with nonunion: Secondary | ICD-10-CM

## 2019-09-06 LAB — SURGICAL PATHOLOGY

## 2019-09-06 NOTE — Anesthesia Postprocedure Evaluation (Addendum)
Anesthesia Post Note  Patient: Jasmine Mclaughlin  Procedure(s) Performed: COLONOSCOPY WITH PROPOFOL (N/A ) POLYPECTOMY     Patient location during evaluation: Endoscopy Anesthesia Type: MAC Level of consciousness: awake Pain management: pain level controlled Vital Signs Assessment: post-procedure vital signs reviewed and stable Respiratory status: spontaneous breathing Cardiovascular status: stable Postop Assessment: no apparent nausea or vomiting Anesthetic complications: no   No complications documented.  Last Vitals:  Vitals:   09/01/19 1239 09/01/19 1250  BP: 110/84 (!) 141/65  Pulse: 67 62  Resp: 18 19  Temp: (!) 36.4 C   SpO2: 97% 97%    Last Pain:  Vitals:   09/02/19 1330  TempSrc:   PainSc: 0-No pain                 Huston Foley

## 2019-09-08 NOTE — Progress Notes (Signed)
I connected with Jasmine Mclaughlin today by telephone and verified that I am speaking with the correct person using two identifiers. Location patient: home Location provider: work Persons participating in the virtual visit: patient, Marine scientist.    I discussed the limitations, risks, security and privacy concerns of performing an evaluation and management service by telephone and the availability of in person appointments. I also discussed with the patient that there may be a patient responsible charge related to this service. The patient expressed understanding and verbally consented to this telephonic visit.    Interactive audio and video telecommunications were attempted between this provider and patient, however failed, due to patient having technical difficulties OR patient did not have access to video capability.  We continued and completed visit with audio only.  Some vital signs may be absent or patient reported.   Subjective:   Jasmine Mclaughlin is a 69 y.o. female who presents for Medicare Annual (Subsequent) preventive examination.  Review of Systems    Cardiac Risk Factors include: advanced age (>72men, >36 women)     Objective:    Today's Vitals   09/09/19 0851  BP: 130/82   There is no height or weight on file to calculate BMI.  Advanced Directives 09/09/2019 09/01/2019 09/08/2018 07/08/2018 12/15/2017 08/11/2017 06/11/2016  Does Patient Have a Medical Advance Directive? No No No No No No No  Would patient like information on creating a medical advance directive? No - Patient declined No - Patient declined No - Patient declined - No - Patient declined No - Patient declined Yes (MAU/Ambulatory/Procedural Areas - Information given)  Pre-existing out of facility DNR order (yellow form or pink MOST form) - - - - - - -    Current Medications (verified) Outpatient Encounter Medications as of 09/09/2019  Medication Sig  . acetaminophen (TYLENOL) 500 MG tablet Take 1,000 mg by mouth 2 (two) times daily.   Marland Kitchen  albuterol (PROVENTIL HFA;VENTOLIN HFA) 108 (90 Base) MCG/ACT inhaler Inhale 2 puffs into the lungs every 6 (six) hours as needed for wheezing or shortness of breath.  Marland Kitchen alendronate (FOSAMAX) 70 MG tablet TAKE 1 TABLET BY MOUTH EVERY 7 DAYS. TAKE WITH A FULL GLASS OF WATER ON AN EMPTY STOMACH. (Patient taking differently: Take 70 mg by mouth every Saturday. )  . chlorpheniramine (CHLOR-TRIMETON) 4 MG tablet Take 4 mg by mouth daily as needed for allergies.  . citalopram (CELEXA) 20 MG tablet Take 1 tablet (20 mg total) by mouth daily.  . diphenhydrAMINE (SOMINEX) 25 MG tablet Take 25 mg by mouth at bedtime.  Marland Kitchen tiZANidine (ZANAFLEX) 4 MG capsule Take 1 capsule (4 mg total) by mouth 3 (three) times daily as needed for muscle spasms.  Marland Kitchen ULTRAM 50 MG tablet Take 50 mg by mouth every 6 (six) hours as needed for moderate pain.   . nitroGLYCERIN (NITROSTAT) 0.4 MG SL tablet Place 1 tablet (0.4 mg total) under the tongue every 5 (five) minutes as needed. (Patient taking differently: Place 0.4 mg under the tongue every 5 (five) minutes as needed for chest pain. )   No facility-administered encounter medications on file as of 09/09/2019.    Allergies (verified) Patient has no known allergies.   History: Past Medical History:  Diagnosis Date  . Allergy   . Anxiety   . Arthritis    degenerative back, joint disturbance "everywhere "  . Asthma   . Chest tightness 10/27/2013  . Colon polyps   . COPD (chronic obstructive pulmonary disease) ([place])   .  Generalized anxiety disorder 11/13/2012  . Genital warts   . Obesity (BMI 30-39.9) 10/14/2012  . Osteoporosis   . Pneumonia   . Shortness of breath    pt./ reports that she is out of shape  . Sinusitis, acute 10/27/2013  . Tobacco abuse 09/28/2010  . Wheezing 10/27/2013   Past Surgical History:  Procedure Laterality Date  . ANTERIOR FUSION LUMBAR SPINE  05/06/2012  . ANTERIOR LAT LUMBAR FUSION N/A 05/06/2012   Procedure: ANTERIOR LATERAL LUMBAR FUSION 3  LEVELS;  Surgeon: Sinclair Ship, MD;  Location: [facility];  Service: Orthopedics;  Laterality: N/A;  Lateral interbody fusion, lumbar 2-3,lumbar 3-4, lumbar 4-5 with instrumentation and allograft.  . COLONOSCOPY    . COLONOSCOPY WITH PROPOFOL N/A 12/15/2017   Procedure: COLONOSCOPY WITH PROPOFOL;  Surgeon: Rush Landmark Telford Nab., MD;  Location: [place];  Service: Gastroenterology;  Laterality: N/A;  . COLONOSCOPY WITH PROPOFOL N/A 07/08/2018   Procedure: COLONOSCOPY WITH PROPOFOL;  Surgeon: Rush Landmark Telford Nab., MD;  Location: [facility];  Service: Gastroenterology;  Laterality: N/A;  . COLONOSCOPY WITH PROPOFOL N/A 09/01/2019   Procedure: COLONOSCOPY WITH PROPOFOL;  Surgeon: Rush Landmark Telford Nab., MD;  Location: [facility];  Service: Gastroenterology;  Laterality: N/A;  . KNEE ARTHROSCOPY Right     R knee 01/2012, for ligament tears  . POLYPECTOMY  07/08/2018   Procedure: POLYPECTOMY;  Surgeon: Mansouraty, Telford Nab., MD;  Location: [place];  Service: Gastroenterology;;  . POLYPECTOMY  09/01/2019   Procedure: POLYPECTOMY;  Surgeon: Irving Copas., MD;  Location: [facility];  Service: Gastroenterology;;  . TONSILLECTOMY    . VAGINAL DELIVERY     x1   Family History  Problem Relation Age of Onset  . Arthritis Mother   . Arthritis Father   . Stroke Father   . Colon cancer Father 36  . Heart disease Father 21       chf  . Breast cancer Maternal Grandmother   . Asthma Maternal Grandmother   . Asthma Daughter   . Esophageal cancer Neg Hx   . Ulcerative colitis Neg Hx   . Stomach cancer Neg Hx    Social History   Socioeconomic History  . Marital status: Single    Spouse name: Not on file  . Number of children: 1  . Years of education: Not on file  . Highest education level: Not on file  Occupational History  . 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 Smoking status: Current Every Day Smoker    Packs/day: 0.50     Years: 55.00    Pack years: 27.50    Types: Cigarettes  . Smokeless tobacco: Never Used  Vaping Use  . Vaping Use: Never used  Substance and Sexual Activity  . Alcohol use: Yes    Alcohol/week: 0.0 standard drinks    Comment: "sporatic"- wine, beer, mixed drink  . Drug use: No  . Sexual activity: Not Currently    Partners: Male  Other Topics Concern  . Not on file  Social History Narrative   1 caffeine drinks daily   No exercise   Social Determinants of Health   Financial Resource Strain: Low Risk   . Difficulty of Paying Living Expenses: Not hard at all  Food Insecurity: No Food Insecurity  . Worried About Charity fundraiser in the Last Year: Never true  . Ran Out of Food in the Last Year: Never true  Transportation Needs: No Transportation  Needs  . Lack of Transportation (Medical): No  . Lack of Transportation (Non-Medical): No  Physical Activity:   . Days of Exercise per Week: Not on file  . Minutes of Exercise per Session: Not on file  Stress:   . Feeling of Stress : Not on file  Social Connections:   . Frequency of Communication with Friends and Family: Not on file  . Frequency of Social Gatherings with Friends and Family: Not on file  . Attends Religious Services: Not on file  . Active Member of Clubs or Organizations: Not on file  . Attends Archivist Meetings: Not on file  . Marital Status: Not on file    Tobacco Counseling Ready to quit: No Counseling given: Yes   Clinical Intake: Pain : No/denies pain    Activities of Daily Living In your present state of health, do you have any difficulty performing the following activities: 09/09/2019  Hearing? N  Vision? N  Difficulty concentrating or making decisions? N  Walking or climbing stairs? N  Dressing or bathing? N  Doing errands, shopping? N  Preparing Food and eating ? N  Using the Toilet? N  In the past six months, have you accidently leaked urine? N  Do you have problems with loss  of bowel control? N  Managing your Medications? N  Managing your Finances? N  Housekeeping or managing your Housekeeping? N  Some recent data might be hidden    Patient Care Team: Carollee Herter, Alferd Apa, DO as PCP - General (Family Medicine) Berniece Salines, DO as PCP - Cardiology (Cardiology)  Indicate any recent Medical Services you may have received from other than [facility] providers in the past year (date may be approximate).     Assessment:   This is a routine wellness examination for [place].  Dietary issues and exercise activities discussed: Current Exercise Habits: The patient does not participate in regular exercise at present, Exercise limited by: None identified Diet (meal preparation, eat out, water intake, caffeinated beverages, dairy products, fruits and vegetables): well balanced    Goals    . DIET - INCREASE WATER INTAKE     At least 3 glasses per day.       Depression Screen PHQ 2/9 Scores 09/09/2019 09/08/2018 08/11/2017 06/11/2016 10/14/2012  PHQ - 2 Score 0 1 1 0 0    Fall Risk Fall Risk  09/09/2019 09/08/2018 06/11/2016  Falls in the past year? 1 0 No  Number falls in past yr: 1 - -  Injury with Fall? 0 - -  Follow up Education provided;Falls prevention discussed - -    Any stairs in or around the home? No  If so, are there any without handrails? No  Home free of loose throw rugs in walkways, pet beds, electrical cords, etc? Yes  Adequate lighting in your home to reduce risk of falls? Yes   ASSISTIVE DEVICES UTILIZED TO PREVENT FALLS:  Life alert? No  Use of a cane, walker or w/c? No  Grab bars in the bathroom? Yes  Shower chair or bench in shower? No  Elevated toilet seat or a handicapped toilet? No     Cognitive Function: Ad8 score reviewed for issues:  Issues making decisions:no  Less interest in hobbies / activities:no  Repeats questions, stories (family complaining): no  Trouble using ordinary gadgets (microwave, computer, phone):no  Forgets the  month or year: no  Mismanaging finances: no  Remembering appts:no  Daily problems with thinking and/or memory:no Ad8 score is=0  MMSE - Mini Mental State Exam 06/11/2016  Orientation to time 5  Orientation to Place 5  Registration 3  Attention/ Calculation 5  Recall 3  Language- name 2 objects 2  Language- repeat 1  Language- follow 3 step command 3  Language- read & follow direction 1  Write a sentence 1  Copy design 1  Total score 30        Immunizations Immunization History  Administered Date(s) Administered  . Fluad Quad(high Dose 65+) 09/22/2018  . Influenza-Unspecified 11/14/2014  . Pneumococcal Conjugate-13 08/29/2017  . Pneumococcal Polysaccharide-23 02/15/2014, 09/22/2018  . Tdap 08/29/2010  . Zoster 10/14/2012    TDAP status: Up to date Flu Vaccine status: Up to date Pneumococcal vaccine status: Up to date Covid-19 vaccine status: Completed vaccines  Qualifies for Shingles Vaccine? Yes   Zostavax completed Yes     Screening Tests Health Maintenance  Topic Date Due  . COVID-19 Vaccine (1) Never done  . INFLUENZA VACCINE  08/08/2019  . MAMMOGRAM  10/09/2019  . TETANUS/TDAP  08/28/2020  . COLONOSCOPY  08/31/2024  . DEXA SCAN  Completed  . Hepatitis C Screening  Completed  . PNA vac Low Risk Adult  Completed    Health Maintenance  Health Maintenance Due  Topic Date Due  . COVID-19 Vaccine (1) Never done  . INFLUENZA VACCINE  08/08/2019    Colorectal cancer screening: Completed 09/01/19. Repeat every 3 years Mammogram status: Completed 10/12/2018. Repeat every year Bone Density status: Completed 12/08/18. Results reflect: Bone density results: OSTEOPOROSIS. Repeat every 2 years.  Lung Cancer Screening: scheduled 10/18/19  Additional Screening:  Hepatitis C Screening: does qualify; Completed 06/11/16  Vision Screening: Recommended annual ophthalmology exams for early detection of glaucoma and other disorders of the eye. Is the patient  up to date with their annual eye exam?  Yes  [facility] 1 month ago per pt   Dental Screening: Recommended annual dental exams for proper oral hygiene  Community Resource Referral / Chronic Care Management: CRR required this visit?  No   CCM required this visit?  No      Plan:    Continue to eat heart healthy diet (full of fruits, vegetables, whole grains, lean protein, water--limit salt, fat, and sugar intake) and increase physical activity as tolerated.  Continue doing brain stimulating activities (puzzles, reading, adult coloring books, staying active) to keep memory sharp.     I have personally reviewed and noted the following in the patient's chart:   . Medical and social history . Use of alcohol, tobacco or illicit drugs  . Current medications and supplements . Functional ability and status . Nutritional status . Physical activity . Advanced directives . List of other physicians . Hospitalizations, surgeries, and ER visits in previous 12 months . Vitals . Screenings to include cognitive, depression, and falls . Referrals and appointments  In addition, I have reviewed and discussed with patient certain preventive protocols, quality metrics, and best practice recommendations. A written personalized care plan for preventive services as well as general preventive health recommendations were provided to patient.    Due to this being a telephonic visit, the after visit summary with patients personalized plan was offered to patient via mail or my-chart. Patient would like to access on my-chart.  Naaman Plummer [place], [place]   09/09/2019

## 2019-09-09 ENCOUNTER — Ambulatory Visit (INDEPENDENT_AMBULATORY_CARE_PROVIDER_SITE_OTHER): Payer: Medicare HMO | Admitting: *Deleted

## 2019-09-09 ENCOUNTER — Other Ambulatory Visit: Payer: Self-pay

## 2019-09-09 ENCOUNTER — Other Ambulatory Visit: Payer: Self-pay | Admitting: Orthopedic Surgery

## 2019-09-09 ENCOUNTER — Encounter: Payer: Self-pay | Admitting: *Deleted

## 2019-09-09 ENCOUNTER — Other Ambulatory Visit: Payer: Self-pay | Admitting: Acute Care

## 2019-09-09 VITALS — BP 130/82

## 2019-09-09 DIAGNOSIS — Z0000 Encounter for general adult medical examination without abnormal findings: Secondary | ICD-10-CM

## 2019-09-09 NOTE — Patient Instructions (Signed)
Continue to eat heart healthy diet (full of fruits, vegetables, whole grains, lean protein, water--limit salt, fat, and sugar intake) and increase physical activity as tolerated.  Continue doing brain stimulating activities (puzzles, reading, adult coloring books, staying active) to keep memory sharp.    Jasmine Mclaughlin , Thank you for taking time to come for your Medicare Wellness Visit. I appreciate your ongoing commitment to your health goals. Please review the following plan we discussed and let me know if I can assist you in the future.   These are the goals we discussed: Goals     DIET - INCREASE WATER INTAKE     At least 3 glasses per day.        This is a list of the screening recommended for you and due dates:  Health Maintenance  Topic Date Due   Flu Shot  08/08/2019   Mammogram  10/09/2019   Tetanus Vaccine  08/28/2020   Colon Cancer Screening  08/31/2024   DEXA scan (bone density measurement)  Completed   COVID-19 Vaccine  Completed    Hepatitis C: One time screening is recommended by Center for Disease Control  (CDC) for  adults born from [birth year] through 1964.   Completed   Pneumonia vaccines  Completed    Preventive Care 65 Years and Older, Female Preventive care refers to lifestyle choices and visits with your health care provider that can promote health and wellness. This includes:  A yearly physical exam. This is also called an annual well check.  Regular dental and eye exams.  Immunizations.  Screening for certain conditions.  Healthy lifestyle choices, such as diet and exercise. What can I expect for my preventive care visit? Physical exam Your health care provider will check:  Height and weight. These may be used to calculate body mass index (BMI), which is a measurement that tells if you are at a healthy weight.  Heart rate and blood pressure.  Your skin for abnormal spots. Counseling Your health care provider may ask you questions  about:  Alcohol, tobacco, and drug use.  Emotional well-being.  Home and relationship well-being.  Sexual activity.  Eating habits.  History of falls.  Memory and ability to understand (cognition).  Work and work Statistician.  Pregnancy and menstrual history. What immunizations do I need?  Influenza (flu) vaccine  This is recommended every year. Tetanus, diphtheria, and pertussis (Tdap) vaccine  You may need a Td booster every 10 years. Varicella (chickenpox) vaccine  You may need this vaccine if you have not already been vaccinated. Zoster (shingles) vaccine  You may need this after age 56. Pneumococcal conjugate (PCV13) vaccine  One dose is recommended after age 87. Pneumococcal polysaccharide (PPSV23) vaccine  One dose is recommended after age 16. Measles, mumps, and rubella (MMR) vaccine  You may need at least one dose of MMR if you were born in 1957 or later. You may also need a second dose. Meningococcal conjugate (MenACWY) vaccine  You may need this if you have certain conditions. Hepatitis A vaccine  You may need this if you have certain conditions or if you travel or work in places where you may be exposed to hepatitis A. Hepatitis B vaccine  You may need this if you have certain conditions or if you travel or work in places where you may be exposed to hepatitis B. Haemophilus influenzae type b (Hib) vaccine  You may need this if you have certain conditions. You may receive vaccines as individual doses or  as more than one vaccine together in one shot (combination vaccines). Talk with your health care provider about the risks and benefits of combination vaccines. What tests do I need? Blood tests  Lipid and cholesterol levels. These may be checked every 5 years, or more frequently depending on your overall health.  Hepatitis C test.  Hepatitis B test. Screening  Lung cancer screening. You may have this screening every year starting at age 25 if  you have a 30-pack-year history of smoking and currently smoke or have quit within the past 15 years.  Colorectal cancer screening. All adults should have this screening starting at age 38 and continuing until age 47. Your health care provider may recommend screening at age 55 if you are at increased risk. You will have tests every 1-10 years, depending on your results and the type of screening test.  Diabetes screening. This is done by checking your blood sugar (glucose) after you have not eaten for a while (fasting). You may have this done every 1-3 years.  Mammogram. This may be done every 1-2 years. Talk with your health care provider about how often you should have regular mammograms.  BRCA-related cancer screening. This may be done if you have a family history of breast, ovarian, tubal, or peritoneal cancers. Other tests  Sexually transmitted disease (STD) testing.  Bone density scan. This is done to screen for osteoporosis. You may have this done starting at age 63. Follow these instructions at home: Eating and drinking  Eat a diet that includes fresh fruits and vegetables, whole grains, lean protein, and low-fat dairy products. Limit your intake of foods with high amounts of sugar, saturated fats, and salt.  Take vitamin and mineral supplements as recommended by your health care provider.  Do not drink alcohol if your health care provider tells you not to drink.  If you drink alcohol: ? Limit how much you have to 0-1 drink a day. ? Be aware of how much alcohol is in your drink. In the [place] one drink equals one 12 oz bottle of beer (355 mL), one 5 oz glass of wine (148 mL), or one 1 oz glass of hard liquor (44 mL). Lifestyle  Take daily care of your teeth and gums.  Stay active. Exercise for at least 30 minutes on 5 or more days each week.  Do not use any products that contain nicotine or tobacco, such as cigarettes, e-cigarettes, and chewing tobacco. If you need help  quitting, ask your health care provider.  If you are sexually active, practice safe sex. Use a condom or other form of protection in order to prevent STIs (sexually transmitted infections).  Talk with your health care provider about taking a low-dose aspirin or statin. What's next?  Go to your health care provider once a year for a well check visit.  Ask your health care provider how often you should have your eyes and teeth checked.  Stay up to date on all vaccines. This information is not intended to replace advice given to you by your health care provider. Make sure you discuss any questions you have with your health care provider. Document Revised: 12/18/2017 Document Reviewed: 12/18/2017 Elsevier Patient Education  2020 [employer].

## 2019-09-10 ENCOUNTER — Other Ambulatory Visit: Payer: Medicare HMO

## 2019-09-11 ENCOUNTER — Other Ambulatory Visit: Payer: Self-pay | Admitting: Family Medicine

## 2019-09-15 ENCOUNTER — Encounter: Payer: Self-pay | Admitting: Gastroenterology

## 2019-09-23 ENCOUNTER — Encounter: Payer: Self-pay | Admitting: Family Medicine

## 2019-09-23 ENCOUNTER — Ambulatory Visit (INDEPENDENT_AMBULATORY_CARE_PROVIDER_SITE_OTHER): Payer: Medicare HMO | Admitting: Family Medicine

## 2019-09-23 ENCOUNTER — Other Ambulatory Visit: Payer: Self-pay

## 2019-09-23 ENCOUNTER — Other Ambulatory Visit: Payer: Self-pay | Admitting: Family Medicine

## 2019-09-23 VITALS — BP 100/60 | HR 63 | Temp 98.5°F | Resp 18 | Ht 65.0 in | Wt 194.8 lb

## 2019-09-23 DIAGNOSIS — R296 Repeated falls: Secondary | ICD-10-CM | POA: Diagnosis not present

## 2019-09-23 DIAGNOSIS — E785 Hyperlipidemia, unspecified: Secondary | ICD-10-CM | POA: Diagnosis not present

## 2019-09-23 DIAGNOSIS — M5136 Other intervertebral disc degeneration, lumbar region: Secondary | ICD-10-CM | POA: Diagnosis not present

## 2019-09-23 DIAGNOSIS — Z23 Encounter for immunization: Secondary | ICD-10-CM

## 2019-09-23 DIAGNOSIS — Z0000 Encounter for general adult medical examination without abnormal findings: Secondary | ICD-10-CM

## 2019-09-23 DIAGNOSIS — E538 Deficiency of other specified B group vitamins: Secondary | ICD-10-CM | POA: Diagnosis not present

## 2019-09-23 DIAGNOSIS — M62838 Other muscle spasm: Secondary | ICD-10-CM

## 2019-09-23 DIAGNOSIS — I10 Essential (primary) hypertension: Secondary | ICD-10-CM | POA: Diagnosis not present

## 2019-09-23 DIAGNOSIS — Z0001 Encounter for general adult medical examination with abnormal findings: Secondary | ICD-10-CM

## 2019-09-23 DIAGNOSIS — R5383 Other fatigue: Secondary | ICD-10-CM

## 2019-09-23 DIAGNOSIS — E559 Vitamin D deficiency, unspecified: Secondary | ICD-10-CM | POA: Diagnosis not present

## 2019-09-23 DIAGNOSIS — J4520 Mild intermittent asthma, uncomplicated: Secondary | ICD-10-CM | POA: Diagnosis not present

## 2019-09-23 DIAGNOSIS — D229 Melanocytic nevi, unspecified: Secondary | ICD-10-CM

## 2019-09-23 MED ORDER — TIZANIDINE HCL 4 MG PO CAPS: 4.0000 mg | ORAL_CAPSULE | Freq: Three times a day (TID) | ORAL | 1 refills | Status: DC | PRN

## 2019-09-23 MED ORDER — QVAR REDIHALER 40 MCG/ACT IN AERB: 2.0000 | INHALATION_SPRAY | Freq: Two times a day (BID) | RESPIRATORY_TRACT | 3 refills | Status: DC

## 2019-09-23 MED ORDER — ALBUTEROL SULFATE HFA 108 (90 BASE) MCG/ACT IN AERS: 2.0000 | INHALATION_SPRAY | Freq: Four times a day (QID) | RESPIRATORY_TRACT | 5 refills | Status: DC | PRN

## 2019-09-23 NOTE — Patient Instructions (Signed)
Preventive Care 68 Years and Older, Female Preventive care refers to lifestyle choices and visits with your health care provider that can promote health and wellness. This includes:  A yearly physical exam. This is also called an annual well check.  Regular dental and eye exams.  Immunizations.  Screening for certain conditions.  Healthy lifestyle choices, such as diet and exercise. What can I expect for my preventive care visit? Physical exam Your health care provider will check:  Height and weight. These may be used to calculate body mass index (BMI), which is a measurement that tells if you are at a healthy weight.  Heart rate and blood pressure.  Your skin for abnormal spots. Counseling Your health care provider may ask you questions about:  Alcohol, tobacco, and drug use.  Emotional well-being.  Home and relationship well-being.  Sexual activity.  Eating habits.  History of falls.  Memory and ability to understand (cognition).  Work and work Statistician.  Pregnancy and menstrual history. What immunizations do I need?  Influenza (flu) vaccine  This is recommended every year. Tetanus, diphtheria, and pertussis (Tdap) vaccine  You may need a Td booster every 10 years. Varicella (chickenpox) vaccine  You may need this vaccine if you have not already been vaccinated. Zoster (shingles) vaccine  You may need this after age 33. Pneumococcal conjugate (PCV13) vaccine  One dose is recommended after age 33. Pneumococcal polysaccharide (PPSV23) vaccine  One dose is recommended after age 72. Measles, mumps, and rubella (MMR) vaccine  You may need at least one dose of MMR if you were born in 1957 or later. You may also need a second dose. Meningococcal conjugate (MenACWY) vaccine  You may need this if you have certain conditions. Hepatitis A vaccine  You may need this if you have certain conditions or if you travel or work in places where you may be exposed  to hepatitis A. Hepatitis B vaccine  You may need this if you have certain conditions or if you travel or work in places where you may be exposed to hepatitis B. Haemophilus influenzae type b (Hib) vaccine  You may need this if you have certain conditions. You may receive vaccines as individual doses or as more than one vaccine together in one shot (combination vaccines). Talk with your health care provider about the risks and benefits of combination vaccines. What tests do I need? Blood tests  Lipid and cholesterol levels. These may be checked every 5 years, or more frequently depending on your overall health.  Hepatitis C test.  Hepatitis B test. Screening  Lung cancer screening. You may have this screening every year starting at age 39 if you have a 30-pack-year history of smoking and currently smoke or have quit within the past 15 years.  Colorectal cancer screening. All adults should have this screening starting at age 36 and continuing until age 15. Your health care provider may recommend screening at age 23 if you are at increased risk. You will have tests every 1-10 years, depending on your results and the type of screening test.  Diabetes screening. This is done by checking your blood sugar (glucose) after you have not eaten for a while (fasting). You may have this done every 1-3 years.  Mammogram. This may be done every 1-2 years. Talk with your health care provider about how often you should have regular mammograms.  BRCA-related cancer screening. This may be done if you have a family history of breast, ovarian, tubal, or peritoneal cancers.  Other tests  Sexually transmitted disease (STD) testing.  Bone density scan. This is done to screen for osteoporosis. You may have this done starting at age 44. Follow these instructions at home: Eating and drinking  Eat a diet that includes fresh fruits and vegetables, whole grains, lean protein, and low-fat dairy products. Limit  your intake of foods with high amounts of sugar, saturated fats, and salt.  Take vitamin and mineral supplements as recommended by your health care provider.  Do not drink alcohol if your health care provider tells you not to drink.  If you drink alcohol: ? Limit how much you have to 0-1 drink a day. ? Be aware of how much alcohol is in your drink. In the [place] one drink equals one 12 oz bottle of beer (355 mL), one 5 oz glass of wine (148 mL), or one 1 oz glass of hard liquor (44 mL). Lifestyle  Take daily care of your teeth and gums.  Stay active. Exercise for at least 30 minutes on 5 or more days each week.  Do not use any products that contain nicotine or tobacco, such as cigarettes, e-cigarettes, and chewing tobacco. If you need help quitting, ask your health care provider.  If you are sexually active, practice safe sex. Use a condom or other form of protection in order to prevent STIs (sexually transmitted infections).  Talk with your health care provider about taking a low-dose aspirin or statin. What's next?  Go to your health care provider once a year for a well check visit.  Ask your health care provider how often you should have your eyes and teeth checked.  Stay up to date on all vaccines. This information is not intended to replace advice given to you by your health care provider. Make sure you discuss any questions you have with your health care provider. Document Revised: 12/18/2017 Document Reviewed: 12/18/2017 Elsevier Patient Education  2020 [employer].

## 2019-09-23 NOTE — Telephone Encounter (Signed)
Insurance does not cover QVAR.  They will cover the Flovent.  If ok with change please submit back with sig, qty, and refills.

## 2019-09-23 NOTE — Progress Notes (Deleted)
Subjective:     Jasmine Mclaughlin is a 69 y.o. female and is here for a comprehensive physical exam. The patient reports multiple problems with frequent falls .  Social History   Socioeconomic History  . Marital status: Single    Spouse name: Not on file  . Number of children: 1  . Years of education: Not on file  . Highest education level: Not on file  Occupational History  . 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 Smoking status: Current Every Day Smoker    Packs/day: 0.50    Years: 55.00    Pack years: 27.50    Types: Cigarettes  . Smokeless tobacco: Never Used  Vaping Use  . Vaping Use: Never used  Substance and Sexual Activity  . Alcohol use: Yes    Alcohol/week: 0.0 standard drinks    Comment: "sporatic"- wine, beer, mixed drink  . Drug use: No  . Sexual activity: Not Currently    Partners: Male  Other Topics Concern  . Not on file  Social History Narrative   1 caffeine drinks daily   No exercise   Social Determinants of Health   Financial Resource Strain: Low Risk   . Difficulty of Paying Living Expenses: Not hard at all  Food Insecurity: No Food Insecurity  . Worried About Charity fundraiser in the Last Year: Never true  . Ran Out of Food in the Last Year: Never true  Transportation Needs: No Transportation Needs  . Lack of Transportation (Medical): No  . Lack of Transportation (Non-Medical): No  Physical Activity:   . Days of Exercise per Week: Not on file  . Minutes of Exercise per Session: Not on file  Stress:   . Feeling of Stress : Not on file  Social Connections:   . Frequency of Communication with Friends and Family: Not on file  . Frequency of Social Gatherings with Friends and Family: Not on file  . Attends Religious Services: Not on file  . Active Member of Clubs or Organizations: Not on file  . Attends Archivist Meetings: Not on file  . Marital Status: Not on file  Intimate Partner Violence:    . Fear of Current or Ex-Partner: Not on file  . Emotionally Abused: Not on file  . Physically Abused: Not on file  . Sexually Abused: Not on file   Health Maintenance  Topic Date Due  . INFLUENZA VACCINE  08/08/2019  . MAMMOGRAM  10/09/2019  . TETANUS/TDAP  08/28/2020  . COLONOSCOPY  08/31/2024  . DEXA SCAN  Completed  . COVID-19 Vaccine  Completed  . Hepatitis C Screening  Completed  . PNA vac Low Risk Adult  Completed    {Common ambulatory SmartLinks:19316}  Review of Systems {ros; complete:30496}   Objective:    {Exam, [phone]}    Assessment:    Healthy female exam. ***     Plan:     See After Visit Summary for Counseling Recommendations

## 2019-09-23 NOTE — Progress Notes (Signed)
Subjective:     Jasmine Mclaughlin is a 69 y.o. female and is here for a comprehensive physical exam. The patient reports problems - falling frequently -- 7-8 x this year.  She has had back problems and is seeing ortho ---- she can feel herself lean in one direction and she keeps going and falls.   It usually happens at night  She states she just does not feel good.   Social History   Socioeconomic History  . Marital status: Single    Spouse name: Not on file  . Number of children: 1  . Years of education: Not on file  . Highest education level: Not on file  Occupational History  . 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 Smoking status: Current Every Day Smoker    Packs/day: 0.50    Years: 55.00    Pack years: 27.50    Types: Cigarettes  . Smokeless tobacco: Never Used  Vaping Use  . Vaping Use: Never used  Substance and Sexual Activity  . Alcohol use: Yes    Alcohol/week: 0.0 standard drinks    Comment: "sporatic"- wine, beer, mixed drink  . Drug use: No  . Sexual activity: Not Currently    Partners: Male  Other Topics Concern  . Not on file  Social History Narrative   1 caffeine drinks daily   No exercise   Social Determinants of Health   Financial Resource Strain: Low Risk   . Difficulty of Paying Living Expenses: Not hard at all  Food Insecurity: No Food Insecurity  . Worried About Charity fundraiser in the Last Year: Never true  . Ran Out of Food in the Last Year: Never true  Transportation Needs: No Transportation Needs  . Lack of Transportation (Medical): No  . Lack of Transportation (Non-Medical): No  Physical Activity:   . Days of Exercise per Week: Not on file  . Minutes of Exercise per Session: Not on file  Stress:   . Feeling of Stress : Not on file  Social Connections:   . Frequency of Communication with Friends and Family: Not on file  . Frequency of Social Gatherings with Friends and Family: Not on file  .  Attends Religious Services: Not on file  . Active Member of Clubs or Organizations: Not on file  . Attends Archivist Meetings: Not on file  . Marital Status: Not on file  Intimate Partner Violence:   . Fear of Current or Ex-Partner: Not on file  . Emotionally Abused: Not on file  . Physically Abused: Not on file  . Sexually Abused: Not on file   Health Maintenance  Topic Date Due  . INFLUENZA VACCINE  08/08/2019  . MAMMOGRAM  10/09/2019  . TETANUS/TDAP  08/28/2020  . COLONOSCOPY  08/31/2024  . DEXA SCAN  Completed  . COVID-19 Vaccine  Completed  . Hepatitis C Screening  Completed  . PNA vac Low Risk Adult  Completed    The following portions of the patient's history were reviewed and updated as appropriate:  She  has a past medical history of Allergy, Anxiety, Arthritis, Asthma, Chest tightness (10/27/2013), Colon polyps, COPD (chronic obstructive pulmonary disease) ([employer]), Generalized anxiety disorder (11/13/2012), Genital warts, Obesity (BMI 30-39.9) (10/14/2012), Osteoporosis, Pneumonia, Shortness of breath, Sinusitis, acute (10/27/2013), Tobacco abuse (09/28/2010), and Wheezing (10/27/2013). She does not have any pertinent problems on file. She  has a past surgical history that includes  Knee arthroscopy (Right); Tonsillectomy; Vaginal delivery; Anterior fusion lumbar spine (05/06/2012); Anterior lat lumbar fusion (N/A, 05/06/2012); Colonoscopy; Colonoscopy with propofol (N/A, 12/15/2017); Colonoscopy with propofol (N/A, 07/08/2018); polypectomy (07/08/2018); Colonoscopy with propofol (N/A, 09/01/2019); and polypectomy (09/01/2019). Her family history includes Arthritis in her father and mother; Asthma in her daughter and maternal grandmother; Breast cancer in her maternal grandmother; Colon cancer (age of onset: 89) in her father; Heart disease (age of onset: 43) in her father; Stroke in her father. She  reports that she has been smoking cigarettes. She has a 27.50 pack-year smoking  history. She has never used smokeless tobacco. She reports current alcohol use. She reports that she does not use drugs. She has a current medication list which includes the following prescription(s): acetaminophen, albuterol, alendronate, chlorpheniramine, citalopram, diphenhydramine, tizanidine, ultram, flovent diskus, and nitroglycerin. Current Outpatient Medications on File Prior to Visit  Medication Sig Dispense Refill  . acetaminophen (TYLENOL) 500 MG tablet Take 1,000 mg by mouth 2 (two) times daily.     Marland Kitchen alendronate (FOSAMAX) 70 MG tablet TAKE 1 TABLET BY MOUTH EVERY 7 DAYS. TAKE WITH A FULL GLASS OF WATER ON AN EMPTY STOMACH. 4 tablet 0  . chlorpheniramine (CHLOR-TRIMETON) 4 MG tablet Take 4 mg by mouth daily as needed for allergies.    . citalopram (CELEXA) 20 MG tablet Take 1 tablet (20 mg total) by mouth daily. 90 tablet 3  . diphenhydrAMINE (SOMINEX) 25 MG tablet Take 25 mg by mouth at bedtime.    Marland Kitchen ULTRAM 50 MG tablet Take 50 mg by mouth every 6 (six) hours as needed for moderate pain.     . nitroGLYCERIN (NITROSTAT) 0.4 MG SL tablet Place 1 tablet (0.4 mg total) under the tongue every 5 (five) minutes as needed. (Patient taking differently: Place 0.4 mg under the tongue every 5 (five) minutes as needed for chest pain. ) 30 tablet 3   No current facility-administered medications on file prior to visit.   She has No Known Allergies..  Review of Systems Review of Systems  Constitutional: Negative for activity change, appetite change and fatigue.  HENT: Negative for hearing loss, congestion, tinnitus and ear discharge.  dentist q68m Eyes: Negative for visual disturbance (see optho q1y -- vision corrected to 20/20 with glasses).  Respiratory: Negative for cough, chest tightness and shortness of breath.   Cardiovascular: Negative for chest pain, palpitations and leg swelling.  Gastrointestinal: Negative for abdominal pain, diarrhea, constipation and abdominal distention.   Genitourinary: Negative for urgency, frequency, decreased urine volume and difficulty urinating.  Musculoskeletal: Negative for back pain, arthralgias and gait problem.  Skin: Negative for color change, pallor and rash.  Neurological: Negative for dizziness, light-headedness, numbness and headaches.  Hematological: Negative for adenopathy. Does not bruise/bleed easily.  Psychiatric/Behavioral: Negative for suicidal ideas, confusion, sleep disturbance, self-injury, dysphoric mood, decreased concentration and agitation.       Objective:    BP 100/60 (BP Location: Right Arm, Patient Position: Sitting, Cuff Size: Large)   Pulse 63   Temp 98.5 F (36.9 C) (Oral)   Resp 18   Ht 5\' 5"  (1.651 m)   Wt 194 lb 12.8 oz (88.4 kg)   SpO2 96%   BMI 32.42 kg/m  General appearance: alert, cooperative, appears stated age and no distress Head: Normocephalic, without obvious abnormality, asymmetric shape Eyes: negative findings: lids and lashes normal, conjunctivae and sclerae normal and pupils equal, round, reactive to light and accomodation Ears: normal TM's and external ear canals both ears Neck: no  adenopathy, no carotid bruit, no JVD, supple, symmetrical, trachea midline and thyroid not enlarged, symmetric, no tenderness/mass/nodules Back: symmetric, no curvature. ROM normal. No CVA tenderness. Lungs: clear to auscultation bilaterally Breasts: normal appearance, no masses or tenderness Heart: regular rate and rhythm, S1, S2 normal, no murmur, click, rub or gallop Abdomen: soft, non-tender; bowel sounds normal; no masses,  no organomegaly L Pulses: 2+ and symmetric Skin:L cheek ---- + skin lesion about 1/2 in , rough , raised and dry  Lymph nodes: Cervical, supraclavicular, and axillary nodes normal. Neurologic: Alert and oriented X 3, normal strength and tone. Normal symmetric reflexes. Normal coordination and gait  .  Assessment:    Healthy female exam.      Plan:  ghm utd Check labs     See After Visit Summary for Counseling Recommendations    1. Muscle spasm Stable  Refill muscle relaxer  - tiZANidine (ZANAFLEX) 4 MG capsule; Take 1 capsule (4 mg total) by mouth 3 (three) times daily as needed for muscle spasms.  Dispense: 30 capsule; Refill: 1  2. Need for influenza vaccination  - Flu Vaccine QUAD High Dose(Fluad)  3. Mild intermittent asthma, unspecified whether complicated Stable con't meds   4. Essential hypertension Well controlled, no changes to meds. Encouraged heart healthy diet such as the DASH diet and exercise as tolerated.   - Comprehensive metabolic panel - Lipid panel  5. Other fatigue Stable  - CBC with Differential/Platelet - TSH - Vitamin B12 - Vitamin D (25 hydroxy)  6. Frequent falls Referral to neuro and pt  - Ambulatory referral to Neurology - Ambulatory referral to Physical Therapy - CBC with Differential/Platelet - TSH - Vitamin B12 - Vitamin D (25 hydroxy)  7. Degenerative disc disease, lumbar  - Ambulatory referral to Physical Therapy  8. Preventative health care See above   9. Suspicious nevus Refer to derm for skin check  - Ambulatory referral to Dermatology

## 2019-09-24 ENCOUNTER — Other Ambulatory Visit: Payer: Medicare HMO

## 2019-09-24 ENCOUNTER — Encounter: Payer: Self-pay | Admitting: Neurology

## 2019-09-24 LAB — CBC WITH DIFFERENTIAL/PLATELET
Absolute Monocytes: 740 cells/uL (ref 200–950)
Basophils Absolute: 96 cells/uL (ref 0–200)
Basophils Relative: 1.1 %
Eosinophils Absolute: 339 cells/uL (ref 15–500)
Eosinophils Relative: 3.9 %
HCT: 42.6 % (ref 35.0–45.0)
Hemoglobin: 14.5 g/dL (ref 11.7–15.5)
Lymphs Abs: 3184 cells/uL (ref 850–3900)
MCH: 34 pg — ABNORMAL HIGH (ref 27.0–33.0)
MCHC: 34 g/dL (ref 32.0–36.0)
MCV: 99.8 fL (ref 80.0–100.0)
MPV: 9.7 fL (ref 7.5–12.5)
Monocytes Relative: 8.5 %
Neutro Abs: 4341 cells/uL (ref 1500–7800)
Neutrophils Relative %: 49.9 %
Platelets: 332 10*3/uL (ref 140–400)
RBC: 4.27 10*6/uL (ref 3.80–5.10)
RDW: 12.2 % (ref 11.0–15.0)
Total Lymphocyte: 36.6 %
WBC: 8.7 10*3/uL (ref 3.8–10.8)

## 2019-09-24 LAB — LIPID PANEL
Cholesterol: 199 mg/dL (ref <200)
HDL: 50 mg/dL (ref >=50)
LDL Cholesterol (Calc): 117 mg/dL (calc) — ABNORMAL HIGH
Non-HDL Cholesterol (Calc): 149 mg/dL (calc) — ABNORMAL HIGH (ref <130)
Total CHOL/HDL Ratio: 4 (calc) (ref <5.0)
Triglycerides: 200 mg/dL — ABNORMAL HIGH (ref <150)

## 2019-09-24 LAB — COMPREHENSIVE METABOLIC PANEL
AG Ratio: 1.9 (calc) (ref 1.0–2.5)
ALT: 14 U/L (ref 6–29)
AST: 14 U/L (ref 10–35)
Albumin: 4.5 g/dL (ref 3.6–5.1)
Alkaline phosphatase (APISO): 63 U/L (ref 37–153)
BUN: 20 mg/dL (ref 7–25)
CO2: 28 mmol/L (ref 20–32)
Calcium: 10 mg/dL (ref 8.6–10.4)
Chloride: 100 mmol/L (ref 98–110)
Creat: 0.8 mg/dL (ref 0.50–0.99)
Globulin: 2.4 g/dL (calc) (ref 1.9–3.7)
Glucose, Bld: 103 mg/dL — ABNORMAL HIGH (ref 65–99)
Potassium: 5 mmol/L (ref 3.5–5.3)
Sodium: 137 mmol/L (ref 135–146)
Total Bilirubin: 0.5 mg/dL (ref 0.2–1.2)
Total Protein: 6.9 g/dL (ref 6.1–8.1)

## 2019-09-24 LAB — VITAMIN B12: Vitamin B-12: 299 pg/mL (ref 200–1100)

## 2019-09-24 LAB — TSH: TSH: 0.95 mIU/L (ref 0.40–4.50)

## 2019-09-24 LAB — VITAMIN D 25 HYDROXY (VIT D DEFICIENCY, FRACTURES): Vit D, 25-Hydroxy: 27 ng/mL — ABNORMAL LOW (ref 30–100)

## 2019-09-27 ENCOUNTER — Encounter: Payer: Self-pay | Admitting: Family Medicine

## 2019-09-27 ENCOUNTER — Other Ambulatory Visit: Payer: Self-pay | Admitting: Family Medicine

## 2019-09-27 ENCOUNTER — Other Ambulatory Visit: Payer: Self-pay

## 2019-09-27 DIAGNOSIS — F419 Anxiety disorder, unspecified: Secondary | ICD-10-CM

## 2019-09-27 MED ORDER — VITAMIN D (ERGOCALCIFEROL) 1.25 MG (50000 UNIT) PO CAPS: 50000.0000 [IU] | ORAL_CAPSULE | ORAL | 1 refills | Status: DC

## 2019-09-28 ENCOUNTER — Encounter: Payer: Self-pay | Admitting: Rehabilitative and Restorative Service Providers"

## 2019-09-28 ENCOUNTER — Ambulatory Visit (INDEPENDENT_AMBULATORY_CARE_PROVIDER_SITE_OTHER): Payer: Medicare HMO | Admitting: Rehabilitative and Restorative Service Providers"

## 2019-09-28 ENCOUNTER — Other Ambulatory Visit: Payer: Self-pay

## 2019-09-28 DIAGNOSIS — G8929 Other chronic pain: Secondary | ICD-10-CM | POA: Diagnosis not present

## 2019-09-28 DIAGNOSIS — M6281 Muscle weakness (generalized): Secondary | ICD-10-CM

## 2019-09-28 DIAGNOSIS — R2689 Other abnormalities of gait and mobility: Secondary | ICD-10-CM | POA: Diagnosis not present

## 2019-09-28 DIAGNOSIS — R2681 Unsteadiness on feet: Secondary | ICD-10-CM | POA: Diagnosis not present

## 2019-09-28 DIAGNOSIS — M545 Low back pain, unspecified: Secondary | ICD-10-CM

## 2019-09-28 NOTE — Patient Instructions (Signed)
Access Code: [email or web address] URL: [email or web address] Date: 09/28/2019 Prepared by: Rudell Cobb  Exercises Seated Gaze Stabilization with Head Rotation - 2-3 x daily - 7 x weekly - 1 sets - 1 reps - 30 seconds hold Seated Figure 4 Piriformis Stretch - 2 x daily - 7 x weekly - 1 sets - 3 reps - 30 seconds hold Sit to Stand - 2 x daily - 7 x weekly - 1 sets - 5 reps Standing Marching - 2 x daily - 7 x weekly - 1 sets - 10 reps

## 2019-09-28 NOTE — Therapy (Signed)
[facility] [place], [place], [address] Phone: [phone]   Fax:  [phone]  Physical Therapy Evaluation  Patient Details  Name: Jasmine Mclaughlin MRN: [id number] Date of Birth: 1950-09-17 Referring Provider (PT): Roma Schanz, MD   Encounter Date: 09/28/2019   PT End of Session - 09/28/19 1257    Visit Number 1    Number of Visits 12    Date for PT Re-Evaluation 11/09/19    Authorization Type aetna medicare    PT Start Time 0930    PT Stop Time 1017    PT Time Calculation (min) 47 min           Past Medical History:  Diagnosis Date  . Allergy   . Anxiety   . Arthritis    degenerative back, joint disturbance "everywhere "  . Asthma   . Chest tightness 10/27/2013  . Colon polyps   . COPD (chronic obstructive pulmonary disease) ([place])   . Generalized anxiety disorder 11/13/2012  . Genital warts   . Obesity (BMI 30-39.9) 10/14/2012  . Osteoporosis   . Pneumonia   . Shortness of breath    pt./ reports that she is out of shape  . Sinusitis, acute 10/27/2013  . Tobacco abuse 09/28/2010  . Wheezing 10/27/2013    Past Surgical History:  Procedure Laterality Date  . ANTERIOR FUSION LUMBAR SPINE  05/06/2012  . ANTERIOR LAT LUMBAR FUSION N/A 05/06/2012   Procedure: ANTERIOR LATERAL LUMBAR FUSION 3 LEVELS;  Surgeon: Sinclair Ship, MD;  Location: [facility];  Service: Orthopedics;  Laterality: N/A;  Lateral interbody fusion, lumbar 2-3,lumbar 3-4, lumbar 4-5 with instrumentation and allograft.  . COLONOSCOPY    . COLONOSCOPY WITH PROPOFOL N/A 12/15/2017   Procedure: COLONOSCOPY WITH PROPOFOL;  Surgeon: Rush Landmark Telford Nab., MD;  Location: [facility];  Service: Gastroenterology;  Laterality: N/A;  . COLONOSCOPY WITH PROPOFOL N/A 07/08/2018   Procedure: COLONOSCOPY WITH PROPOFOL;  Surgeon: Rush Landmark Telford Nab., MD;  Location: [facility];  Service: Gastroenterology;  Laterality: N/A;  .  COLONOSCOPY WITH PROPOFOL N/A 09/01/2019   Procedure: COLONOSCOPY WITH PROPOFOL;  Surgeon: Rush Landmark Telford Nab., MD;  [facility];  Service: Gastroenterology;  Laterality: N/A;  . KNEE ARTHROSCOPY Right     R knee 01/2012, for ligament tears  . POLYPECTOMY  07/08/2018   Procedure: POLYPECTOMY;  Surgeon: Mansouraty, Telford Nab., MD;  Location: [facility];  Service: Gastroenterology;;  . POLYPECTOMY  09/01/2019   Procedure: POLYPECTOMY;  Surgeon: Irving Copas., MD;  Location: [facility];  Service: Gastroenterology;;  . TONSILLECTOMY    . VAGINAL DELIVERY     x1    There were no vitals filed for this visit.    Subjective Assessment - 09/28/19 0931    Subjective The patient reports a gradual worsening of balance this year.  She is increasing in # of falls.  She reports 4 falls in the past 2 months with one of them a "face plant".  She gets no warning prior to falls.  She has fallen indoors and outdoors.  Lately, more falls have occurred during the middle of the night when getting up.    Pertinent History 2014 spinal surgery (lumbar), COPD, DDD lumbar sprine, arthroscopic R knee surgery    Patient Stated Goals Reduction of falls, reducing pain level    Currently in Pain? Yes    Pain Score 5     Pain Location Back    Pain Orientation Lower;Right;Left  Pain Descriptors / Indicators Aching;Discomfort;Numbness;Tingling    Pain Type Chronic pain    Pain Radiating Towards can radiate into bilateral hips and legs    Pain Onset More than a month ago    Pain Frequency Intermittent    Aggravating Factors  pain worsening this past year; worse when on her feet    Pain Relieving Factors sitting              [facility] PT Assessment - 09/28/19 0935      Assessment   Medical Diagnosis Frequent falls, DDD lumbar spine    Referring Provider (PT) Roma Schanz, MD    Onset Date/Surgical Date 09/23/19    Hand Dominance Left    Prior Therapy none      Precautions    Precautions Fall      Restrictions   Weight Bearing Restrictions No      Balance Screen   Has the patient fallen in the past 6 months Yes    How many times? 6-7    Has the patient had a decrease in activity level because of a fear of falling?  Yes    Is the patient reluctant to leave their home because of a fear of falling?  Yes      [place] Private residence    Living Arrangements Children   daughter   Type of [place] Access Level entry    [place] bars - tub/shower      Prior Function   Level of [place] Retired    Leisure gardens      Cognition   Overall Cognitive Status Within Functional Limits for tasks assessed   notes mild memory changes (forgetting where phone is)     Editor, commissioning Impaired Detail   some radiating tingling from back     Posture/Postural Control   Posture/Postural Control Postural limitations    Posture Comments right lateral rib shift/ left pelvic shift; leans to R UE during sitting      ROM / Strength   AROM / PROM / Strength AROM;Strength      AROM   Overall AROM  Within functional limits for tasks performed    Overall AROM Comments WNLs for shoulders, elbows, hips, knees, ankles      Strength   Overall Strength Deficits    Overall Strength Comments pain with LE MMT    Strength Assessment Site Shoulder;Elbow;Hip;Knee;Ankle    Right/Left Shoulder Right;Left    Right Shoulder Flexion 5/5    Right Shoulder ABduction 5/5    Left Shoulder Flexion 5/5    Left Shoulder ABduction 5/5    Right/Left Elbow Right;Left    Right Elbow Flexion 5/5    Right Elbow Extension 5/5    Left Elbow Flexion 5/5    Left Elbow Extension 5/5    Right/Left Hip Right;Left    Right Hip Flexion 4-/5    Left Hip Flexion 4-/5    Right/Left Knee Right;Left    Right Knee Flexion 5/5    Right Knee Extension 5/5    Left Knee Flexion 5/5    Left Knee  Extension 5/5    Right/Left Ankle Right;Left    Right Ankle Dorsiflexion 4/5    Left Ankle Dorsiflexion 5/5      Special Tests    Special Tests Lumbar    Other special tests  FADIR negative bilat hips    Lumbar Tests FABER test    Hip Special Tests  Hip Scouring      FABER test   findings Positive    Side LEft   and right   Comment pain and limited ROM      Hip Scouring   Findings Negative    Side Right;Left      Ambulation/Gait   Ambulation/Gait Yes    Ambulation/Gait Assistance 7: Independent    Assistive device None    Gait Pattern Decreased stride length;Trunk flexed    Gait velocity 2.24 ft/sec    Stairs Yes    Stairs Assistance 6: Modified independent (Device/Increase time)    Stair Management Technique One rail Right;Alternating pattern    Number of Stairs 4    Gait Comments --      Standardized Balance Assessment   Standardized Balance Assessment Berg Balance Test      Berg Balance Test   Sit to Stand Able to stand without using hands and stabilize independently    Standing Unsupported Able to stand safely 2 minutes    Sitting with Back Unsupported but Feet Supported on Floor or Stool Able to sit safely and securely 2 minutes    Stand to Sit Sits safely with minimal use of hands    Transfers Able to transfer safely, minor use of hands    Standing Unsupported with Eyes Closed Able to stand 10 seconds safely    Standing Unsupported with Feet Together Able to place feet together independently and stand 1 minute safely    From Standing, Reach Forward with Outstretched Arm Can reach forward >12 cm safely (5")    From Standing Position, Pick up Object from Floor Able to pick up shoe safely and easily    From Standing Position, Turn to Look Behind Over each Shoulder Turn sideways only but maintains balance    Turn 360 Degrees Able to turn 360 degrees safely in 4 seconds or less    Standing Unsupported, Alternately Place Feet on Step/Stool Able to stand independently and  safely and complete 8 steps in 20 seconds    Standing Unsupported, One Foot in Front Able to take small step independently and hold 30 seconds    Standing on One Leg Tries to lift leg/unable to hold 3 seconds but remains standing independently    Total Score 48    Berg comment: 48/56      Functional Gait  Assessment   Gait assessed  Yes    Gait Level Surface Walks 20 ft, slow speed, abnormal gait pattern, evidence for imbalance or deviates 10-15 in outside of the 12 in walkway width. Requires more than 7 sec to ambulate 20 ft.   8.92 seconds   Change in Gait Speed Makes only minor adjustments to walking speed, or accomplishes a change in speed with significant gait deviations, deviates 10-15 in outside the 12 in walkway width, or changes speed but loses balance but is able to recover and continue walking.    Gait with Horizontal Head Turns Performs head turns with moderate changes in gait velocity, slows down, deviates 10-15 in outside 12 in walkway width but recovers, can continue to walk.    Gait with Vertical Head Turns Performs task with moderate change in gait velocity, slows down, deviates 10-15 in outside 12 in walkway width but recovers, can continue to walk.    Gait and Pivot Turn Pivot turns safely within 3 sec and stops quickly with no  loss of balance.    Step Over Obstacle Is able to step over one shoe box (4.5 in total height) without changing gait speed. No evidence of imbalance.    Gait with Narrow Base of Support Is able to ambulate for 10 steps heel to toe with no staggering.    Gait with Eyes Closed Walks 20 ft, slow speed, abnormal gait pattern, evidence for imbalance, deviates 10-15 in outside 12 in walkway width. Requires more than 9 sec to ambulate 20 ft.    Ambulating Backwards Walks 20 ft, slow speed, abnormal gait pattern, evidence for imbalance, deviates 10-15 in outside 12 in walkway width.   28 seconds   Steps Alternating feet, must use rail.    Total Score 16    FGA  comment: 16/30                  Vestibular Assessment - 09/28/19 1007      Vestibulo-Ocular Reflex   VOR 1 Head Only (x 1 viewing) self regulated pace:  patient gets a dizzy sensation with VOR x 1 x 10 seconds; worked on this task for HEP with gaze fixation x 30 seconds              Objective measurements completed on examination: See above findings.       [facility] Adult PT Treatment/Exercise - 09/28/19 0935      Exercises   Exercises Knee/Hip      Knee/Hip Exercises: Stretches   Piriformis Stretch Right;Left;1 rep;30 seconds      Knee/Hip Exercises: Standing   Hip Flexion Stengthening;Right;Left    Hip Flexion Limitations standing marching near counter top for support      Knee/Hip Exercises: Seated   Sit to Sand 5 reps           Vestibular Treatment/Exercise - 09/28/19 1300      Vestibular Treatment/Exercise   Vestibular Treatment Provided Gaze    Gaze Exercises X1 Viewing Horizontal      X1 Viewing Horizontal   Foot Position seated    Comments 30 seconds with cues on technique                 PT Education - 09/28/19 1256    Education Details HEP    Person(s) Educated Patient    Methods Explanation;Demonstration;Handout    Comprehension Returned demonstration;Verbalized understanding               PT Long Term Goals - 09/28/19 1301      PT LONG TERM GOAL #1   Title The patient will be indep with HEP.    Time 6    Period Weeks    Target Date 11/09/19      PT LONG TERM GOAL #2   Title The patient will improve Berg balance scale from 48/56 to > or equal to 52/56.    Time 6    Period Weeks    Target Date 11/09/19      PT LONG TERM GOAL #3   Title The patient will improve FGA from 16/30 to > or equal to 22/30 to demo dec'd risk for falls.    Time 6    Period Weeks    Target Date 11/09/19      PT LONG TERM GOAL #4   Title The patient will move floor<>stand due to h/o falls with UE support through chair/support surface mod  indep.    Time 6    Period Weeks    Target Date  11/09/19      PT LONG TERM GOAL #5   Title The patient will report pain < or equal to 2/10 at rest in low back.    Time 6    Period Weeks    Target Date 11/09/19                  Plan - 09/28/19 1303    Clinical Impression Statement The patient is a 69 yo female presenting to OP rehab with worsening balance, falls, and h/o DDD lumbar spine.  She presents today with postural abnormalities, muscle weakness in LEs, dec'd static balance per 48/56 on Berg, dec'd dynamic balance per FGA of 16/30, and low back pain.  Functionally, she is noticing increased falls, and limiting her self with ADLs and IADLs due to unsteadiness.  PT to address deficits to optimize current functional status.    Personal Factors and Comorbidities Comorbidity 3+    Comorbidities COPD, osteoporosis, obesity    Examination-Activity Limitations Locomotion Level;Bend;Stairs;Stand;Squat    Examination-Participation Restrictions Meal Prep;Cleaning;Community Activity    Stability/Clinical Decision Making Evolving/Moderate complexity    Clinical Decision Making Moderate    Rehab Potential Good    PT Frequency 2x / week    PT Duration 6 weeks    PT Treatment/Interventions ADLs/Self Care Home Management;Taping;Gait training;Stair training;Functional mobility training;Therapeutic activities;Therapeutic exercise;Balance training;Neuromuscular re-education;Manual techniques;Patient/family education;Electrical Stimulation;Moist Heat;Vestibular;Dry needling    PT Next Visit Plan progress HEP, dynamic gait, core stabilization, hip and ankle strengthening    PT Home Exercise Plan [email or web address]    Consulted and Agree with Plan of Care Patient           Patient will benefit from skilled therapeutic intervention in order to improve the following deficits and impairments:  Pain, Decreased mobility, Decreased strength, Abnormal gait, Decreased balance, Impaired flexibility, Difficulty  walking  Visit Diagnosis: Other abnormalities of gait and mobility  Muscle weakness (generalized)  Unsteadiness on feet  Chronic bilateral low back pain without sciatica     Problem List Patient Active Problem List   Diagnosis Date Noted  . Atypical chest pain 08/25/2019  . Elevated blood pressure reading 08/25/2019  . Tobacco use 08/25/2019  . Shortness of breath   . Pneumonia   . Osteoporosis   . Genital warts   . Colon polyps   . Arthritis   . Anxiety   . Allergy   . Tubular adenoma of colon 11/18/2017  . Allergic rhinitis 01/24/2015  . Acute bronchitis 03/07/2014  . COPD (chronic obstructive pulmonary disease) ([place]) 11/22/2013  . Wheezing 10/27/2013  . Chest tightness 10/27/2013  . Sinusitis, acute 10/27/2013  . Generalized anxiety disorder 11/13/2012  . Degenerative disc disease, lumbar 10/14/2012  . Obesity (BMI 30-39.9) 10/14/2012  . Sinusitis 01/10/2012  . History of colonic polyps 09/28/2010  . Osteoarthritis 09/28/2010  . Tobacco abuse 09/28/2010    Sherolyn Trettin, PT 09/28/2019, 1:08 PM  [facility] [place], [place], [address] Phone: [phone]   Fax:  [phone]  Name: Jasmine Mclaughlin MRN: [id number] Date of Birth: 01/11/1950

## 2019-09-30 ENCOUNTER — Encounter: Payer: Medicare HMO | Admitting: Physical Therapy

## 2019-10-02 ENCOUNTER — Encounter: Payer: Self-pay | Admitting: Family Medicine

## 2019-10-04 MED ORDER — ALENDRONATE SODIUM 70 MG PO TABS: ORAL_TABLET | ORAL | 2 refills | Status: DC

## 2019-10-06 ENCOUNTER — Other Ambulatory Visit: Payer: Self-pay

## 2019-10-06 ENCOUNTER — Encounter: Payer: Self-pay | Admitting: Physical Therapy

## 2019-10-06 ENCOUNTER — Ambulatory Visit (INDEPENDENT_AMBULATORY_CARE_PROVIDER_SITE_OTHER): Payer: Medicare HMO | Admitting: Physical Therapy

## 2019-10-06 DIAGNOSIS — M545 Low back pain, unspecified: Secondary | ICD-10-CM

## 2019-10-06 DIAGNOSIS — M6281 Muscle weakness (generalized): Secondary | ICD-10-CM

## 2019-10-06 DIAGNOSIS — R2681 Unsteadiness on feet: Secondary | ICD-10-CM | POA: Diagnosis not present

## 2019-10-06 DIAGNOSIS — G8929 Other chronic pain: Secondary | ICD-10-CM | POA: Diagnosis not present

## 2019-10-06 DIAGNOSIS — R2689 Other abnormalities of gait and mobility: Secondary | ICD-10-CM

## 2019-10-06 NOTE — Patient Instructions (Signed)
Access Code: [email or web address]: [email or web address]: 09/29/2021Prepared by: [facility] [place]ercises  Seated Gaze Stabilization with Head Rotation - 2-3 x daily - 7 x weekly - 1 sets - 1 reps - 30 seconds hold  Seated Figure 4 Piriformis Stretch - 2 x daily - 7 x weekly - 1 sets - 3 reps - 30 seconds hold  Sit to Stand - 2 x daily - 7 x weekly - 1 sets - 5 reps  Standing Marching - 2 x daily - 7 x weekly - 1 sets - 10 reps  Tandem Stance - 1 x daily - 7 x weekly - 1 sets - 2-3 reps - 15 sec hold  Standing Shoulder W at Wall - 2 x daily - 7 x weekly - 1 sets - 5 reps - 5 hold  Doorway Pec Stretch at 90 Degrees Abduction - 2 x daily - 7 x weekly - 1 sets - 2 reps - 15 sec. hold

## 2019-10-06 NOTE — Therapy (Signed)
[place], [place], [address] Phone: [phone]   Fax:  [phone]  Physical Therapy Treatment  Patient Details  Name: Jasmine Mclaughlin MRN: [id number] Date of Birth: April 10, 1950 Referring Provider (PT): Roma Schanz, MD   Encounter Date: 10/06/2019   PT End of Session - 10/06/19 1148    Visit Number 2    Number of Visits 12    Date for PT Re-Evaluation 11/09/19    Authorization Type aetna medicare    PT Start Time 1150    PT Stop Time 1226    PT Time Calculation (min) 36 min    Activity Tolerance Patient tolerated treatment well    Behavior During Therapy [facility] for tasks assessed/performed           Past Medical History:  Diagnosis Date  . Allergy   . Anxiety   . Arthritis    degenerative back, joint disturbance "everywhere "  . Asthma   . Chest tightness 10/27/2013  . Colon polyps   . COPD (chronic obstructive pulmonary disease) ([place])   . Generalized anxiety disorder 11/13/2012  . Genital warts   . Obesity (BMI 30-39.9) 10/14/2012  . Osteoporosis   . Pneumonia   . Shortness of breath    pt./ reports that she is out of shape  . Sinusitis, acute 10/27/2013  . Tobacco abuse 09/28/2010  . Wheezing 10/27/2013    Past Surgical History:  Procedure Laterality Date  . ANTERIOR FUSION LUMBAR SPINE  05/06/2012  . ANTERIOR LAT LUMBAR FUSION N/A 05/06/2012   Procedure: ANTERIOR LATERAL LUMBAR FUSION 3 LEVELS;  Surgeon: Sinclair Ship, MD;  Location: [facility];  Service: Orthopedics;  Laterality: N/A;  Lateral interbody fusion, lumbar 2-3,lumbar 3-4, lumbar 4-5 with instrumentation and allograft.  . COLONOSCOPY    . COLONOSCOPY WITH PROPOFOL N/A 12/15/2017   Procedure: COLONOSCOPY WITH PROPOFOL;  Surgeon: Rush Landmark Telford Nab., MD;  Location: [facility];  Service: Gastroenterology;  Laterality: N/A;  . COLONOSCOPY WITH PROPOFOL N/A 07/08/2018   Procedure: COLONOSCOPY WITH PROPOFOL;   Surgeon: Rush Landmark Telford Nab., MD;  Location: [facility];  Service: Gastroenterology;  Laterality: N/A;  . COLONOSCOPY WITH PROPOFOL N/A 09/01/2019   Procedure: COLONOSCOPY WITH PROPOFOL;  Surgeon: Rush Landmark Telford Nab., MD;  [facility];  Service: Gastroenterology;  Laterality: N/A;  . KNEE ARTHROSCOPY Right     R knee 01/2012, for ligament tears  . POLYPECTOMY  07/08/2018   Procedure: POLYPECTOMY;  Surgeon: Mansouraty, Telford Nab., MD;  Location: [facility];  Service: Gastroenterology;;  . POLYPECTOMY  09/01/2019   Procedure: POLYPECTOMY;  Surgeon: Irving Copas., MD;  Location: [facility];  Service: Gastroenterology;;  . TONSILLECTOMY    . VAGINAL DELIVERY     x1    There were no vitals filed for this visit.   Subjective Assessment - 10/06/19 1155    Subjective Pt reports she has been doing her exercises 1x/day.  She has not had any falls.  Balance is a smidge better.    Pertinent History 2014 spinal surgery (lumbar), COPD, DDD lumbar sprine, arthroscopic R knee surgery    Patient Stated Goals Reduction of falls, reducing pain level    Currently in Pain? Yes    Pain Score 5    baseline is 5/10   Pain Location Back    Pain Orientation Left;Right    Pain Relieving Factors laying down, sitting              [facility]  PT Assessment - 10/06/19 0001      Assessment   Medical Diagnosis Frequent falls, DDD lumbar spine    Referring Provider (PT) Roma Schanz, MD    Onset Date/Surgical Date 09/23/19    Hand Dominance Left    Next MD Visit 12/21    Prior Therapy none            [facility] Adult PT Treatment/Exercise - 10/06/19 0001      Lumbar Exercises: Stretches   Standing Extension 1 rep;5 seconds    Other Lumbar Stretch Exercise low and mid level doorway stretch x 15 sec each     Piriformis Stretch Right;Left;2 reps;20 seconds    Piriformis Stretch Limitations seated and modified pigeon pose      Lumbar Exercises: Standing   Other Standing  Lumbar Exercises scap retraction with back against noodle x 5 sec x 5 reps; W's x 5 sec x 5 reps      Knee/Hip Exercises: Seated   Sit to Sand 2 sets;without UE support;5 reps   cues for eccentric lowering          Balance Exercises - 10/06/19 0001      Balance Exercises: Standing   Tandem Stance Eyes open;Intermittent upper extremity support;2 reps;20 secs   with/without horiz head turns   SLS Eyes open;Solid surface;2 reps;Intermittent upper extremity support;15 secs    Marching Solid surface;Intermittent upper extremity assist;Static;10 reps   cues for core engagment         Gaze adaptation exercise - horizontal, 1x with cues.          PT Long Term Goals - 09/28/19 1301      PT LONG TERM GOAL #1   Title The patient will be indep with HEP.    Time 6    Period Weeks    Target Date 11/09/19      PT LONG TERM GOAL #2   Title The patient will improve Berg balance scale from 48/56 to > or equal to 52/56.    Time 6    Period Weeks    Target Date 11/09/19      PT LONG TERM GOAL #3   Title The patient will improve FGA from 16/30 to > or equal to 22/30 to demo dec'd risk for falls.    Time 6    Period Weeks    Target Date 11/09/19      PT LONG TERM GOAL #4   Title The patient will move floor<>stand due to h/o falls with UE support through chair/support surface mod indep.    Time 6    Period Weeks    Target Date 11/09/19      PT LONG TERM GOAL #5   Title The patient will report pain < or equal to 2/10 at rest in low back.    Time 6    Period Weeks    Target Date 11/09/19                 Plan - 10/06/19 1235    Clinical Impression Statement Pt reported slight blurred vision with gaze stabilization exercise; resolved with rest.  She is observed holding her head rotated to the Lt during conversation (as if ito hear better with Rt ear), but improved to head in neutral position with cues. Some decreased balance with Rt SLS requiring occasional UE to steady.   Added posture stretches/strengthening to assist with improved posture during gait. Goals are ongoing.    Personal Factors and  Comorbidities Comorbidity 3+    Comorbidities COPD, osteoporosis, obesity    Examination-Activity Limitations Locomotion Level;Bend;Stairs;Stand;Squat    Examination-Participation Restrictions Meal Prep;Cleaning;Community Activity    Stability/Clinical Decision Making Evolving/Moderate complexity    Rehab Potential Good    PT Frequency 2x / week    PT Duration 6 weeks    PT Treatment/Interventions ADLs/Self Care Home Management;Taping;Gait training;Stair training;Functional mobility training;Therapeutic activities;Therapeutic exercise;Balance training;Neuromuscular re-education;Manual techniques;Patient/family education;Electrical Stimulation;Moist Heat;Vestibular;Dry needling    PT Next Visit Plan progress HEP, dynamic gait, core stabilization, hip and ankle strengthening    PT Home Exercise Plan [email or web address]    Consulted and Agree with Plan of Care Patient           Patient will benefit from skilled therapeutic intervention in order to improve the following deficits and impairments:  Pain, Decreased mobility, Decreased strength, Abnormal gait, Decreased balance, Impaired flexibility, Difficulty walking  Visit Diagnosis: Other abnormalities of gait and mobility  Muscle weakness (generalized)  Unsteadiness on feet  Chronic bilateral low back pain without sciatica     Problem List Patient Active Problem List   Diagnosis Date Noted  . Atypical chest pain 08/25/2019  . Elevated blood pressure reading 08/25/2019  . Tobacco use 08/25/2019  . Shortness of breath   . Pneumonia   . Osteoporosis   . Genital warts   . Colon polyps   . Arthritis   . Anxiety   . Allergy   . Tubular adenoma of colon 11/18/2017  . Allergic rhinitis 01/24/2015  . Acute bronchitis 03/07/2014  . COPD (chronic obstructive pulmonary disease) ([place]) 11/22/2013  . Wheezing 10/27/2013    . Chest tightness 10/27/2013  . Sinusitis, acute 10/27/2013  . Generalized anxiety disorder 11/13/2012  . Degenerative disc disease, lumbar 10/14/2012  . Obesity (BMI 30-39.9) 10/14/2012  . Sinusitis 01/10/2012  . History of colonic polyps 09/28/2010  . Osteoarthritis 09/28/2010  . Tobacco abuse 09/28/2010   Kerin Perna, PTA 10/06/19 1:01 PM  [facility] [place], [place], [address] Phone: [phone]   Fax:  [phone]  Name: Jasmine Mclaughlin MRN: [id number] Date of Birth: February 15, 1950

## 2019-10-08 ENCOUNTER — Encounter: Payer: Medicare HMO | Admitting: Rehabilitative and Restorative Service Providers"

## 2019-10-08 DIAGNOSIS — L820 Inflamed seborrheic keratosis: Secondary | ICD-10-CM | POA: Diagnosis not present

## 2019-10-08 DIAGNOSIS — D225 Melanocytic nevi of trunk: Secondary | ICD-10-CM | POA: Diagnosis not present

## 2019-10-08 MED ORDER — ALENDRONATE SODIUM 70 MG PO TABS: ORAL_TABLET | ORAL | 2 refills | Status: DC

## 2019-10-08 NOTE — Addendum Note (Signed)
Addended by: Sanda Linger on: 10/08/2019 08:40 AM   Modules accepted: Orders

## 2019-10-11 ENCOUNTER — Ambulatory Visit (INDEPENDENT_AMBULATORY_CARE_PROVIDER_SITE_OTHER): Payer: Medicare HMO | Admitting: Rehabilitative and Restorative Service Providers"

## 2019-10-11 ENCOUNTER — Encounter: Payer: Self-pay | Admitting: Rehabilitative and Restorative Service Providers"

## 2019-10-11 ENCOUNTER — Other Ambulatory Visit: Payer: Self-pay

## 2019-10-11 DIAGNOSIS — M545 Low back pain, unspecified: Secondary | ICD-10-CM | POA: Diagnosis not present

## 2019-10-11 DIAGNOSIS — R2681 Unsteadiness on feet: Secondary | ICD-10-CM | POA: Diagnosis not present

## 2019-10-11 DIAGNOSIS — R2689 Other abnormalities of gait and mobility: Secondary | ICD-10-CM | POA: Diagnosis not present

## 2019-10-11 DIAGNOSIS — M6281 Muscle weakness (generalized): Secondary | ICD-10-CM

## 2019-10-11 DIAGNOSIS — G8929 Other chronic pain: Secondary | ICD-10-CM | POA: Diagnosis not present

## 2019-10-11 NOTE — Patient Instructions (Signed)
Access Code: [email or web address] URL: [email or web address] Date: 10/11/2019 Prepared by: Rudell Cobb  Exercises Standing Gaze Stabilization with Head Rotation - 2 x daily - 7 x weekly - 1 sets - 1 reps - 30 seconds hold Seated Figure 4 Piriformis Stretch - 2 x daily - 7 x weekly - 1 sets - 3 reps - 30 seconds hold Seated Hamstring Stretch - 2 x daily - 7 x weekly - 1 sets - 2-3 reps - 30 seconds hold Sit to Stand - 2 x daily - 7 x weekly - 1 sets - 5 reps Standing Marching - 2 x daily - 7 x weekly - 1 sets - 10 reps Tandem Stance - 1 x daily - 7 x weekly - 1 sets - 2-3 reps - 15 sec hold Standing Shoulder W at Wall - 2 x daily - 7 x weekly - 1 sets - 5 reps - 5 hold Doorway Pec Stretch at 90 Degrees Abduction - 2 x daily - 7 x weekly - 1 sets - 2 reps - 15 sec. hold

## 2019-10-11 NOTE — Therapy (Signed)
[place], [place], [address] Phone: [phone]   Fax:  [phone]  Physical Therapy Treatment  Patient Details  Name: Jasmine Mclaughlin MRN: [id number] Date of Birth: 05-01-1950 Referring Provider (PT): Roma Schanz, MD   Encounter Date: 10/11/2019   PT End of Session - 10/11/19 0953    Visit Number 3    Number of Visits 12    Date for PT Re-Evaluation 11/09/19    Authorization Type aetna medicare    PT Start Time [phone]    PT Stop Time 1018    PT Time Calculation (min) 39 min    Activity Tolerance Patient tolerated treatment well    Behavior During Therapy [facility] for tasks assessed/performed           Past Medical History:  Diagnosis Date  . Allergy   . Anxiety   . Arthritis    degenerative back, joint disturbance "everywhere "  . Asthma   . Chest tightness 10/27/2013  . Colon polyps   . COPD (chronic obstructive pulmonary disease) ([place])   . Generalized anxiety disorder 11/13/2012  . Genital warts   . Obesity (BMI 30-39.9) 10/14/2012  . Osteoporosis   . Pneumonia   . Shortness of breath    pt./ reports that she is out of shape  . Sinusitis, acute 10/27/2013  . Tobacco abuse 09/28/2010  . Wheezing 10/27/2013    Past Surgical History:  Procedure Laterality Date  . ANTERIOR FUSION LUMBAR SPINE  05/06/2012  . ANTERIOR LAT LUMBAR FUSION N/A 05/06/2012   Procedure: ANTERIOR LATERAL LUMBAR FUSION 3 LEVELS;  Surgeon: Sinclair Ship, MD;  Location: [place];  Service: Orthopedics;  Laterality: N/A;  Lateral interbody fusion, lumbar 2-3,lumbar 3-4, lumbar 4-5 with instrumentation and allograft.  . COLONOSCOPY    . COLONOSCOPY WITH PROPOFOL N/A 12/15/2017   Procedure: COLONOSCOPY WITH PROPOFOL;  Surgeon: Rush Landmark Telford Nab., MD;  Location: [place];  Service: Gastroenterology;  Laterality: N/A;  . COLONOSCOPY WITH PROPOFOL N/A 07/08/2018   Procedure: COLONOSCOPY WITH PROPOFOL;   Surgeon: Rush Landmark Telford Nab., MD;  Location: [place];  Service: Gastroenterology;  Laterality: N/A;  . COLONOSCOPY WITH PROPOFOL N/A 09/01/2019   Procedure: COLONOSCOPY WITH PROPOFOL;  Surgeon: Rush Landmark Telford Nab., MD;  [facility];  Service: Gastroenterology;  Laterality: N/A;  . KNEE ARTHROSCOPY Right     R knee 01/2012, for ligament tears  . POLYPECTOMY  07/08/2018   Procedure: POLYPECTOMY;  Surgeon: Mansouraty, Telford Nab., MD;  Location: [facility];  Service: Gastroenterology;;  . POLYPECTOMY  09/01/2019   Procedure: POLYPECTOMY;  Surgeon: Irving Copas., MD;  Location: [facility];  Service: Gastroenterology;;  . TONSILLECTOMY    . VAGINAL DELIVERY     x1    There were no vitals filed for this visit.   Subjective Assessment - 10/11/19 0940    Subjective The patient reports she is doing her exercises.  She has not had any falls in the past week.    Pertinent History 2014 spinal surgery (lumbar), COPD, DDD lumbar sprine, arthroscopic R knee surgery    Patient Stated Goals Reduction of falls, reducing pain level    Currently in Pain? Yes    Pain Score 7     Pain Location Back    Pain Orientation Right;Left    Pain Descriptors / Indicators Aching;Discomfort;Tingling;Numbness    Pain Type Chronic pain    Pain Onset More than a month ago    Pain Frequency  Intermittent    Aggravating Factors  worse with weather today    Pain Relieving Factors laying down, sitting              [facility] PT Assessment - 10/11/19 0943      Assessment   Medical Diagnosis Frequent falls, DDD lumbar spine    Referring Provider (PT) Roma Schanz, MD    Onset Date/Surgical Date 09/23/19    Hand Dominance Left    Next MD Visit 12/21    Prior Therapy none                         [facility] Adult PT Treatment/Exercise - 10/11/19 0941      Ambulation/Gait   Ambulation/Gait Yes    Ambulation/Gait Assistance 7: Independent    Gait Comments with L hip  elevated as compared to right      Neuro Re-ed    Neuro Re-ed Details  Tandem walking, backwards walking near support, compliant surface standing adding head motion, marching for dynamic gait activities      Exercises   Exercises Knee/Hip;Lumbar      Lumbar Exercises: Stretches   Active Hamstring Stretch Right;Left;1 rep;30 seconds    Hip Flexor Stretch Right;Left;2 reps;30 seconds      Lumbar Exercises: Aerobic   Nustep level 5 x 4 minutes LEs only    Other Aerobic Exercise *L side (SI) sore after nu-step exercise      Lumbar Exercises: Supine   Bent Knee Raise 10 reps    Bridge 10 reps    Isometric Hip Flexion 5 reps;5 seconds    Isometric Hip Flexion Limitations bilaterally      Knee/Hip Exercises: Standing   Lateral Step Up Right;Left;10 reps    SLS near support surface working on dec'ing UE support    Personnel officer in between ther ex to assess response to treatment                  PT Education - 10/11/19 1014    Education Details HEP updated    Person(s) Educated Patient    Methods Explanation;Demonstration;Handout    Comprehension Returned demonstration               PT Long Term Goals - 09/28/19 1301      PT LONG TERM GOAL #1   Title The patient will be indep with HEP.    Time 6    Period Weeks    Target Date 11/09/19      PT LONG TERM GOAL #2   Title The patient will improve Berg balance scale from 48/56 to > or equal to 52/56.    Time 6    Period Weeks    Target Date 11/09/19      PT LONG TERM GOAL #3   Title The patient will improve FGA from 16/30 to > or equal to 22/30 to demo dec'd risk for falls.    Time 6    Period Weeks    Target Date 11/09/19      PT LONG TERM GOAL #4   Title The patient will move floor<>stand due to h/o falls with UE support through chair/support surface mod indep.    Time 6    Period Weeks    Target Date 11/09/19      PT LONG TERM GOAL #5   Title The patient will report pain < or equal to 2/10 at rest in  low back.  Time 6    Period Weeks    Target Date 11/09/19                 Plan - 10/11/19 0953    Clinical Impression Statement The patient notes her low back pain is chronic in nature.  She has elevated L hip in standing and this leads to dec'd L stance during gait and dec'd L single leg stance control.  PT to continue working to [employer].    Personal Factors and Comorbidities --    Comorbidities --    Examination-Activity Limitations --    Examination-Participation Restrictions --    Stability/Clinical Decision Making --    Rehab Potential Good    PT Frequency 2x / week    PT Duration 6 weeks    PT Treatment/Interventions ADLs/Self Care Home Management;Taping;Gait training;Stair training;Functional mobility training;Therapeutic activities;Therapeutic exercise;Balance training;Neuromuscular re-education;Manual techniques;Patient/family education;Electrical Stimulation;Moist Heat;Vestibular;Dry needling    PT Next Visit Plan progress HEP, dynamic gait, core stabilization, hip and ankle strengthening    PT Home Exercise Plan [email or web address]    Consulted and Agree with Plan of Care Patient           Patient will benefit from skilled therapeutic intervention in order to improve the following deficits and impairments:  Pain, Decreased mobility, Decreased strength, Abnormal gait, Decreased balance, Impaired flexibility, Difficulty walking  Visit Diagnosis: Other abnormalities of gait and mobility  Muscle weakness (generalized)  Unsteadiness on feet  Chronic bilateral low back pain without sciatica     Problem List Patient Active Problem List   Diagnosis Date Noted  . Atypical chest pain 08/25/2019  . Elevated blood pressure reading 08/25/2019  . Tobacco use 08/25/2019  . Shortness of breath   . Pneumonia   . Osteoporosis   . Genital warts   . Colon polyps   . Arthritis   . Anxiety   . Allergy   . Tubular adenoma of colon 11/18/2017  . Allergic rhinitis 01/24/2015  .  Acute bronchitis 03/07/2014  . COPD (chronic obstructive pulmonary disease) ([place]) 11/22/2013  . Wheezing 10/27/2013  . Chest tightness 10/27/2013  . Sinusitis, acute 10/27/2013  . Generalized anxiety disorder 11/13/2012  . Degenerative disc disease, lumbar 10/14/2012  . Obesity (BMI 30-39.9) 10/14/2012  . Sinusitis 01/10/2012  . History of colonic polyps 09/28/2010  . Osteoarthritis 09/28/2010  . Tobacco abuse 09/28/2010    Takako Minckler, PT 10/11/2019, 1:15 PM  [facility] [place], [place], [address] Phone: [phone]   Fax:  [phone]  Name: Jasmine Mclaughlin MRN: [id number] Date of Birth: January 18, 1950

## 2019-10-13 ENCOUNTER — Encounter: Payer: Medicare HMO | Admitting: Rehabilitative and Restorative Service Providers"

## 2019-10-18 ENCOUNTER — Other Ambulatory Visit: Payer: Self-pay

## 2019-10-18 ENCOUNTER — Encounter: Payer: Self-pay | Admitting: Physical Therapy

## 2019-10-18 ENCOUNTER — Ambulatory Visit (INDEPENDENT_AMBULATORY_CARE_PROVIDER_SITE_OTHER)
Admission: RE | Admit: 2019-10-18 | Discharge: 2019-10-18 | Disposition: A | Discharge status: Home / Self Care | Payer: Medicare HMO | Source: Ambulatory Visit | Attending: Acute Care | Admitting: Acute Care

## 2019-10-18 ENCOUNTER — Ambulatory Visit (INDEPENDENT_AMBULATORY_CARE_PROVIDER_SITE_OTHER): Payer: Medicare HMO | Admitting: Physical Therapy

## 2019-10-18 DIAGNOSIS — R2689 Other abnormalities of gait and mobility: Secondary | ICD-10-CM | POA: Diagnosis not present

## 2019-10-18 DIAGNOSIS — Z87891 Personal history of nicotine dependence: Secondary | ICD-10-CM | POA: Diagnosis not present

## 2019-10-18 DIAGNOSIS — Z122 Encounter for screening for malignant neoplasm of respiratory organs: Secondary | ICD-10-CM

## 2019-10-18 DIAGNOSIS — M6281 Muscle weakness (generalized): Secondary | ICD-10-CM

## 2019-10-18 DIAGNOSIS — M545 Low back pain, unspecified: Secondary | ICD-10-CM

## 2019-10-18 DIAGNOSIS — G8929 Other chronic pain: Secondary | ICD-10-CM | POA: Diagnosis not present

## 2019-10-18 DIAGNOSIS — R69 Illness, unspecified: Secondary | ICD-10-CM | POA: Diagnosis not present

## 2019-10-18 DIAGNOSIS — F17210 Nicotine dependence, cigarettes, uncomplicated: Secondary | ICD-10-CM | POA: Diagnosis not present

## 2019-10-18 DIAGNOSIS — R2681 Unsteadiness on feet: Secondary | ICD-10-CM | POA: Diagnosis not present

## 2019-10-18 IMAGING — CT CT CHEST LUNG CANCER SCREENING LOW DOSE W/O CM
2 of 5 series · 15 of 40 positions shown, 18 images · non-contrast
Comparison: Low-dose lung cancer screening chest CT [DATE].

CLINICAL DATA: 69-year-old female current smoker with 55 pack-year
history of smoking. Lung cancer screening examination.

EXAM:
CT CHEST WITHOUT CONTRAST LOW-DOSE FOR LUNG CANCER SCREENING
TECHNIQUE: Multidetector CT imaging of the chest was performed following the
standard protocol without IV contrast.

[Series 3: lung thins 1.0 · axial · 0.74mm/px · z∈[-308,-23]mm · 12 of 315 slices shown, 15 images]
[im 15/315  mediastinal]
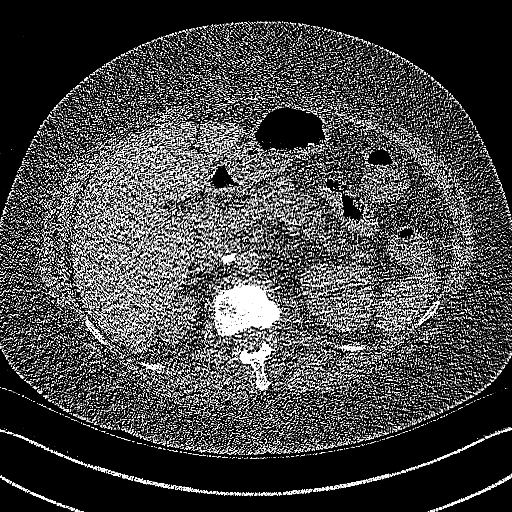
[im 15/315  lung]
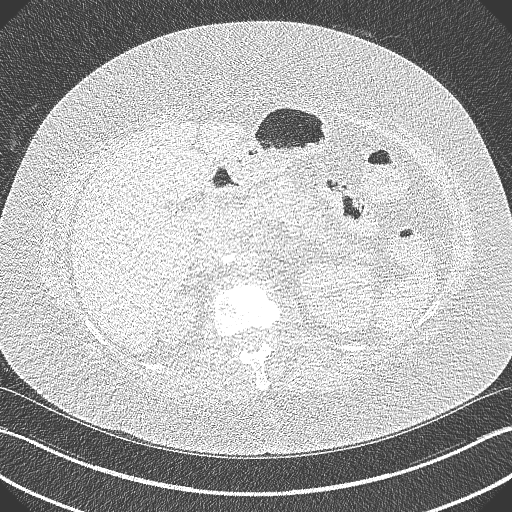
[im 45/315  lung]
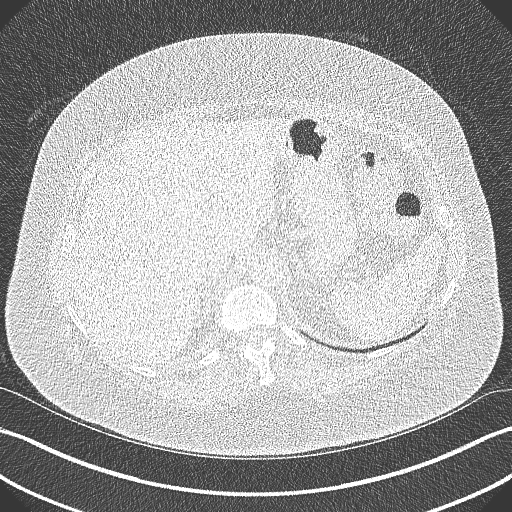
[im 75/315  lung]
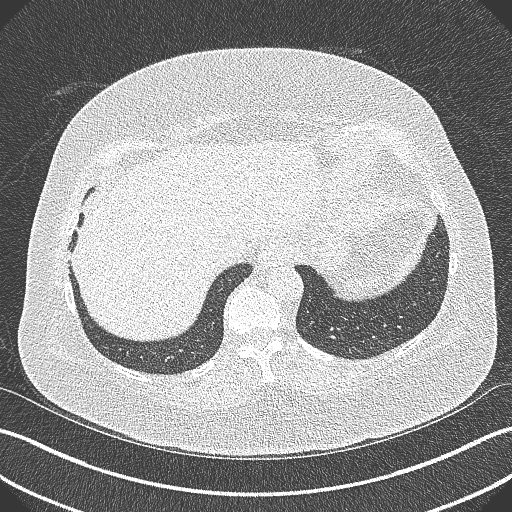
[im 90/315  lung]
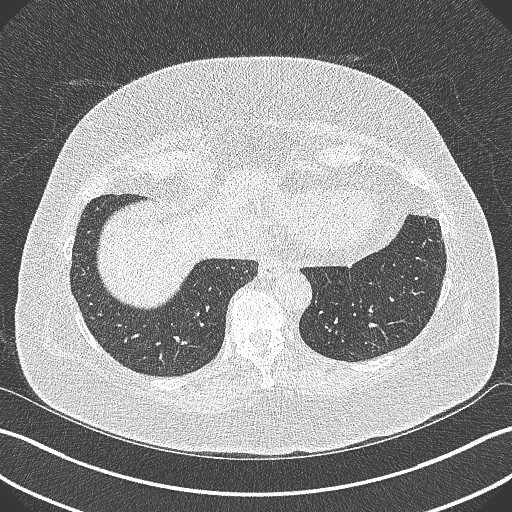
[im 120/315  mediastinal]
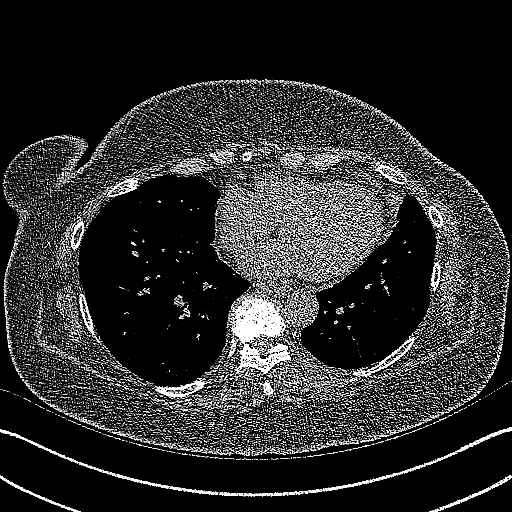
[im 120/315  lung]
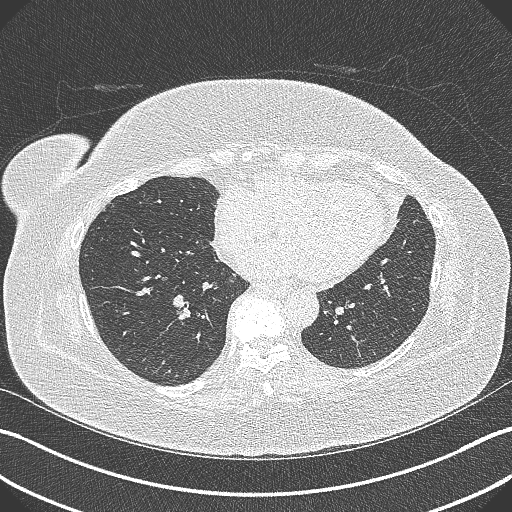
[im 150/315  lung]
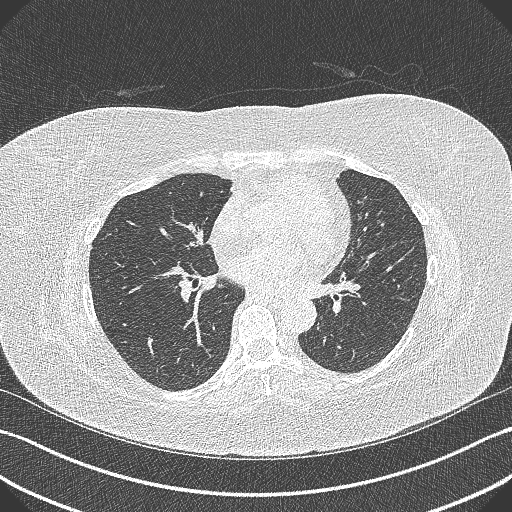
[im 165/315  lung]
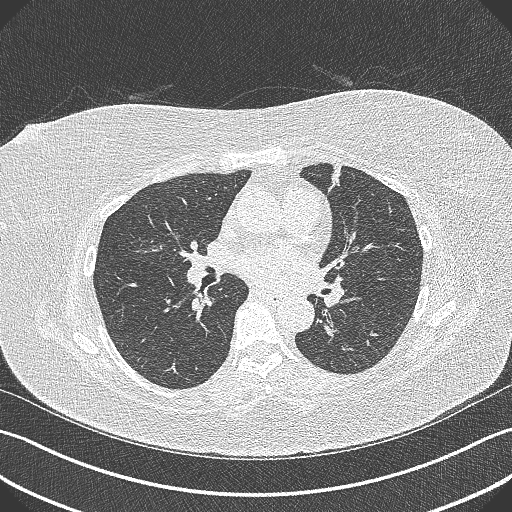
[im 195/315  lung]
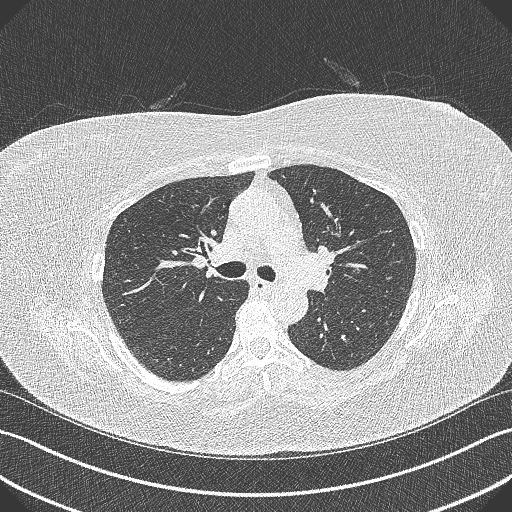
[im 225/315  mediastinal]
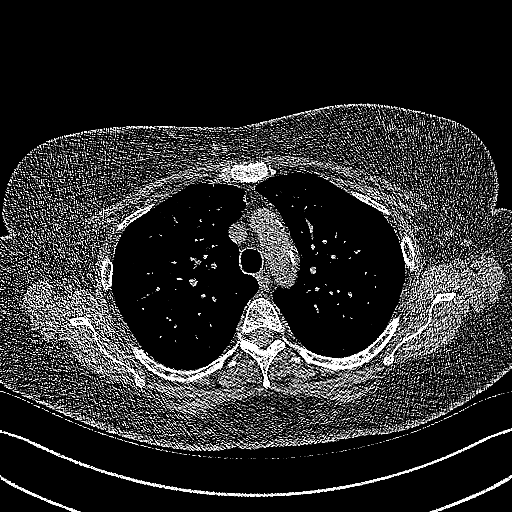
[im 225/315  lung]
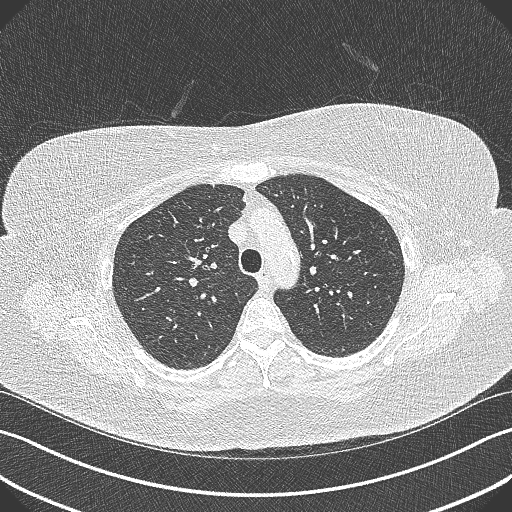
[im 240/315  lung]
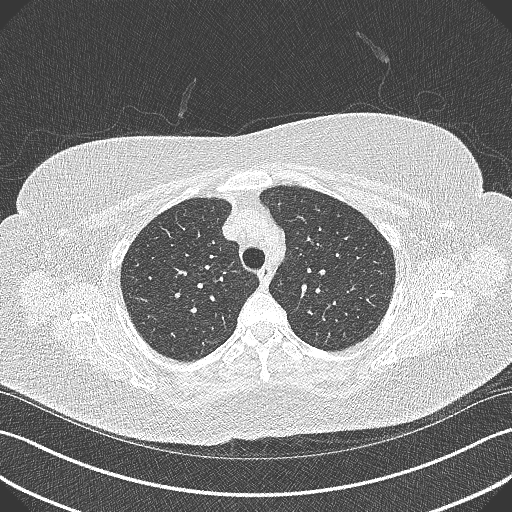
[im 270/315  lung]
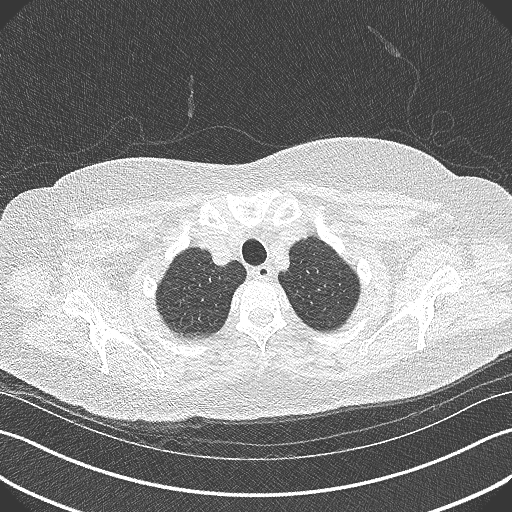
[im 300/315  lung]
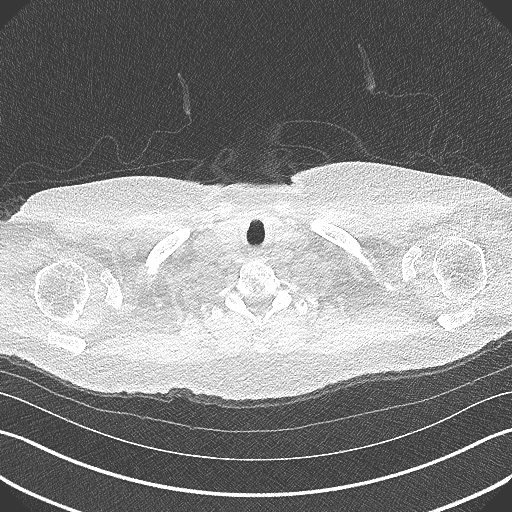

[Series 5: coronal · coronal · 0.60mm/px · 3 of 114 slices shown]
[im 23/114  lung]
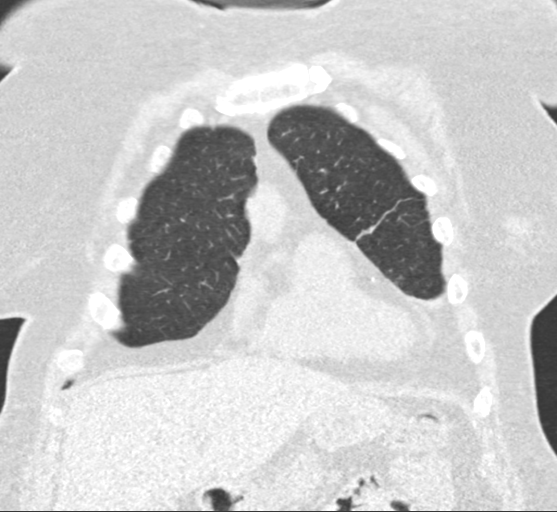
[im 46/114  lung]
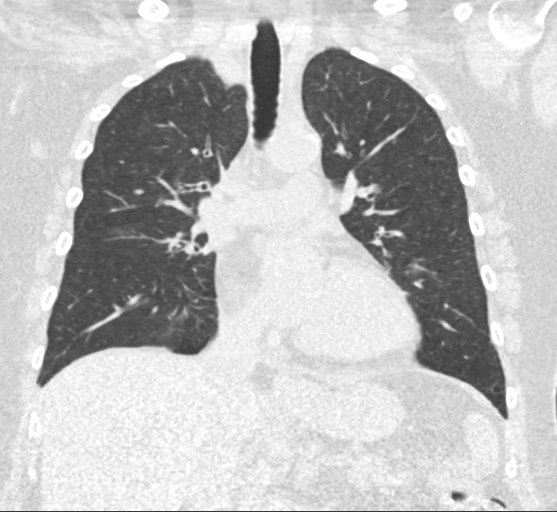
[im 68/114  lung]
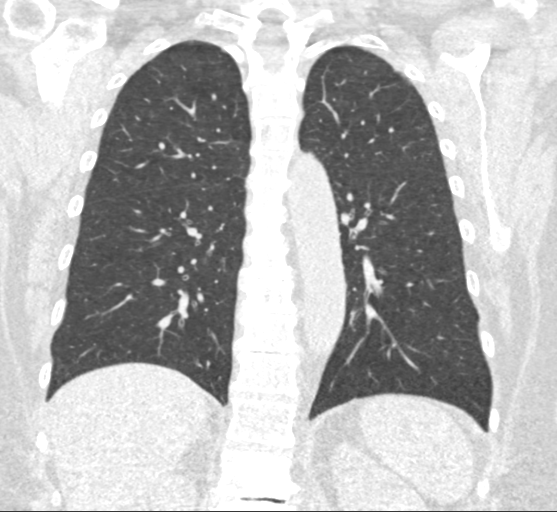

[15 of 40 positions shown; findings below may reference images not displayed]

FINDINGS: Cardiovascular: Heart size is normal. There is no significant
pericardial fluid, thickening or pericardial calcification. There is
aortic atherosclerosis, as well as atherosclerosis of the great
vessels of the mediastinum and the coronary arteries, including
calcified atherosclerotic plaque in the left anterior descending,
left circumflex and right coronary arteries.

Mediastinum/Nodes: No pathologically enlarged mediastinal or hilar
lymph nodes. Please note that accurate exclusion of hilar adenopathy
is limited on noncontrast CT scans. Esophagus is unremarkable in
appearance. No axillary lymphadenopathy.

Lungs/Pleura: Multiple small pulmonary nodules are again noted in
the lungs bilaterally, largest of which is in the medial segment of
the right middle lobe (axial image 169 of series 3), with a volume
derived mean diameter of 3.9 mm. No other larger more suspicious
appearing pulmonary nodules or masses are noted. No acute
consolidative airspace disease. No pleural effusions. Mild diffuse
bronchial wall thickening with mild centrilobular and paraseptal
emphysema.

Upper Abdomen: Aortic atherosclerosis.

Musculoskeletal: There are no aggressive appearing lytic or blastic
lesions noted in the visualized portions of the skeleton.
IMPRESSION: 1. Lung-RADS 2S, benign appearance or behavior. Continue annual
screening with low-dose chest CT without contrast in 12 months.
2. The "S" modifier above refers to potentially clinically
significant non lung cancer related findings. Specifically, there is
aortic atherosclerosis, in addition to 3 vessel coronary artery
disease. Please note that although the presence of coronary artery
calcium documents the presence of coronary artery disease, the
severity of this disease and any potential stenosis cannot be
assessed on this non-gated CT examination. Assessment for potential
risk factor modification, dietary therapy or pharmacologic therapy
may be warranted, if clinically indicated.
3. Mild diffuse bronchial wall thickening with mild centrilobular
and paraseptal emphysema; imaging findings suggestive of underlying
COPD.

Aortic Atherosclerosis ([HA]-[HA]) and Emphysema ([HA]-[HA]).

## 2019-10-18 NOTE — Therapy (Signed)
[place], [place], [address] Phone: [phone]   Fax:  [phone]  Physical Therapy Treatment  Patient Details  Name: Jasmine Mclaughlin MRN: [id number] Date of Birth: 05/11/1950 Referring Provider (PT): Roma Schanz, MD   Encounter Date: 10/18/2019   PT End of Session - 10/18/19 1435    Visit Number 4    Number of Visits 12    Date for PT Re-Evaluation 11/09/19    Authorization Type aetna medicare    PT Start Time 1435    PT Stop Time 1508    PT Time Calculation (min) 33 min    Activity Tolerance Patient tolerated treatment well    Behavior During Therapy [facility] for tasks assessed/performed           Past Medical History:  Diagnosis Date  . Allergy   . Anxiety   . Arthritis    degenerative back, joint disturbance "everywhere "  . Asthma   . Chest tightness 10/27/2013  . Colon polyps   . COPD (chronic obstructive pulmonary disease) ([place])   . Generalized anxiety disorder 11/13/2012  . Genital warts   . Obesity (BMI 30-39.9) 10/14/2012  . Osteoporosis   . Pneumonia   . Shortness of breath    pt./ reports that she is out of shape  . Sinusitis, acute 10/27/2013  . Tobacco abuse 09/28/2010  . Wheezing 10/27/2013    Past Surgical History:  Procedure Laterality Date  . ANTERIOR FUSION LUMBAR SPINE  05/06/2012  . ANTERIOR LAT LUMBAR FUSION N/A 05/06/2012   Procedure: ANTERIOR LATERAL LUMBAR FUSION 3 LEVELS;  Surgeon: Sinclair Ship, MD;  Location: [facility];  Service: Orthopedics;  Laterality: N/A;  Lateral interbody fusion, lumbar 2-3,lumbar 3-4, lumbar 4-5 with instrumentation and allograft.  . COLONOSCOPY    . COLONOSCOPY WITH PROPOFOL N/A 12/15/2017   Procedure: COLONOSCOPY WITH PROPOFOL;  Surgeon: Rush Landmark Telford Nab., MD;  Location: [facility];  Service: Gastroenterology;  Laterality: N/A;  . COLONOSCOPY WITH PROPOFOL N/A 07/08/2018   Procedure: COLONOSCOPY WITH PROPOFOL;   Surgeon: Rush Landmark Telford Nab., MD;  Location: [facility];  Service: Gastroenterology;  Laterality: N/A;  . COLONOSCOPY WITH PROPOFOL N/A 09/01/2019   Procedure: COLONOSCOPY WITH PROPOFOL;  Surgeon: Rush Landmark Telford Nab., MD;  [facility];  Service: Gastroenterology;  Laterality: N/A;  . KNEE ARTHROSCOPY Right     R knee 01/2012, for ligament tears  . POLYPECTOMY  07/08/2018   Procedure: POLYPECTOMY;  Surgeon: Mansouraty, Telford Nab., MD;  Location: [facility];  Service: Gastroenterology;;  . POLYPECTOMY  09/01/2019   Procedure: POLYPECTOMY;  Surgeon: Irving Copas., MD;  Location: [facility];  Service: Gastroenterology;;  . TONSILLECTOMY    . VAGINAL DELIVERY     x1    There were no vitals filed for this visit.   Subjective Assessment - 10/18/19 1438    Subjective Pt reports her balance is getting better.  Pt reports she has had a spell of breathing problems with weather changes.  No falls since she started coming to therapy.    Pertinent History 2014 spinal surgery (lumbar), COPD, DDD lumbar sprine, arthroscopic R knee surgery    Patient Stated Goals Reduction of falls, reducing pain level    Currently in Pain? Yes    Pain Score 5    baseline   Pain Location Back    Pain Orientation Right;Left    Pain Descriptors / Indicators Aching    Pain Type Chronic pain  Aggravating Factors  worse with weather.    Pain Relieving Factors laying down, sitting              [facility] PT Assessment - 10/18/19 0001      Assessment   Medical Diagnosis Frequent falls, DDD lumbar spine    Referring Provider (PT) Roma Schanz, MD    Onset Date/Surgical Date 09/23/19    Hand Dominance Left    Next MD Visit 12/21    Prior Therapy none      Berg Balance Test   Sit to Stand Able to stand without using hands and stabilize independently    Standing Unsupported Able to stand safely 2 minutes    Sitting with Back Unsupported but Feet Supported on Floor or Stool Able to  sit safely and securely 2 minutes    Stand to Sit Sits safely with minimal use of hands    Transfers Able to transfer safely, minor use of hands    Standing Unsupported with Eyes Closed Able to stand 10 seconds safely    Standing Unsupported with Feet Together Able to place feet together independently and stand 1 minute safely    From Standing, Reach Forward with Outstretched Arm Can reach confidently >25 cm (10")    From Standing Position, Pick up Object from Floor Able to pick up shoe safely and easily    From Standing Position, Turn to Look Behind Over each Shoulder Turn sideways only but maintains balance    Turn 360 Degrees Able to turn 360 degrees safely one side only in 4 seconds or less    Standing Unsupported, Alternately Place Feet on Step/Stool Able to stand independently and safely and complete 8 steps in 20 seconds    Standing Unsupported, One Foot in Front Able to place foot tandem independently and hold 30 seconds    Standing on One Leg Able to lift leg independently and hold 5-10 seconds   5  RLE, 8 LLE   Total Score 52      Functional Gait  Assessment   Gait Level Surface Walks 20 ft, slow speed, abnormal gait pattern, evidence for imbalance or deviates 10-15 in outside of the 12 in walkway width. Requires more than 7 sec to ambulate 20 ft.   7.21 sec   Change in Gait Speed Able to change speed, demonstrates mild gait deviations, deviates 6-10 in outside of the 12 in walkway width, or no gait deviations, unable to achieve a major change in velocity, or uses a change in velocity, or uses an assistive device.    Gait with Horizontal Head Turns Performs head turns smoothly with slight change in gait velocity (eg, minor disruption to smooth gait path), deviates 6-10 in outside 12 in walkway width, or uses an assistive device.    Gait with Vertical Head Turns Performs task with slight change in gait velocity (eg, minor disruption to smooth gait path), deviates 6 - 10 in outside 12 in  walkway width or uses assistive device    Gait and Pivot Turn Pivot turns safely within 3 sec and stops quickly with no loss of balance.    Step Over Obstacle Is able to step over one shoe box (4.5 in total height) but must slow down and adjust steps to clear box safely. May require verbal cueing.    Gait with Narrow Base of Support Is able to ambulate for 10 steps heel to toe with no staggering.    Gait with Eyes Closed Walks 20  ft, slow speed, abnormal gait pattern, evidence for imbalance, deviates 10-15 in outside 12 in walkway width. Requires more than 9 sec to ambulate 20 ft.    Ambulating Backwards Walks 20 ft, no assistive devices, good speed, no evidence for imbalance, normal gait    Steps Alternating feet, must use rail.    Total Score 20    FGA comment: 20/30           [facility] Adult PT Treatment/Exercise - 10/18/19 0001      Neuro Re-ed    Neuro Re-ed Details  Dynamic gait activities including gait with horiz / vertical head turns, turning 360, backwards walking, and stepping over obstacles.   Static balance of SLS on non-compliant surface and tandem stance.     Lumbar Exercises: Stretches   Passive Hamstring Stretch Right;Left;2 reps;20 seconds   seated   Piriformis Stretch Right;Left;2 reps;30 seconds    Other Lumbar Stretch Exercise midlevel doorway stretch x 20 sec x 2 reps      Lumbar Exercises: Seated   Other Seated Lumbar Exercises W's x 5 sec x 5 reps                       PT Long Term Goals - 10/18/19 1452      PT LONG TERM GOAL #1   Title The patient will be indep with HEP.    Time 6    Period Weeks    Status On-going      PT LONG TERM GOAL #2   Title The patient will improve Berg balance scale from 48/56 to > or equal to 52/56.    Time 6    Period Weeks    Status Achieved      PT LONG TERM GOAL #3   Title The patient will improve FGA from 16/30 to > or equal to 22/30 to demo dec'd risk for falls.    Time 6    Period Weeks    Status On-going       PT LONG TERM GOAL #4   Title The patient will move floor<>stand due to h/o falls with UE support through chair/support surface mod indep.    Time 6    Period Weeks    Status On-going      PT LONG TERM GOAL #5   Title The patient will report pain < or equal to 2/10 at rest in low back.    Time 6    Period Weeks    Status On-going                 Plan - 10/18/19 1516    Clinical Impression Statement Improved gait speed and SLS time today.  Pt had improved Berg score today; has met LTG# 2.  Tandem stance no longer challenging and gait with horiz head turns has improved.  Pt tolerated exercises without increase in LBP.  Pt is challenged with strength and balance descending stairs.    Comorbidities COPD, osteoporosis, obesity    Rehab Potential Good    PT Frequency 2x / week    PT Duration 6 weeks    PT Treatment/Interventions ADLs/Self Care Home Management;Taping;Gait training;Stair training;Functional mobility training;Therapeutic activities;Therapeutic exercise;Balance training;Neuromuscular re-education;Manual techniques;Patient/family education;Electrical Stimulation;Moist Heat;Vestibular;Dry needling    PT Next Visit Plan progress HEP, dynamic gait, core stabilization, hip and ankle strengthening    PT Home Exercise Plan [email or web address]    Consulted and Agree with Plan of Care Patient  Patient will benefit from skilled therapeutic intervention in order to improve the following deficits and impairments:  Pain, Decreased mobility, Decreased strength, Abnormal gait, Decreased balance, Impaired flexibility, Difficulty walking  Visit Diagnosis: Other abnormalities of gait and mobility  Muscle weakness (generalized)  Unsteadiness on feet  Chronic bilateral low back pain without sciatica     Problem List Patient Active Problem List   Diagnosis Date Noted  . Atypical chest pain 08/25/2019  . Elevated blood pressure reading 08/25/2019  . Tobacco use 08/25/2019    . Shortness of breath   . Pneumonia   . Osteoporosis   . Genital warts   . Colon polyps   . Arthritis   . Anxiety   . Allergy   . Tubular adenoma of colon 11/18/2017  . Allergic rhinitis 01/24/2015  . Acute bronchitis 03/07/2014  . COPD (chronic obstructive pulmonary disease) ([place]) 11/22/2013  . Wheezing 10/27/2013  . Chest tightness 10/27/2013  . Sinusitis, acute 10/27/2013  . Generalized anxiety disorder 11/13/2012  . Degenerative disc disease, lumbar 10/14/2012  . Obesity (BMI 30-39.9) 10/14/2012  . Sinusitis 01/10/2012  . History of colonic polyps 09/28/2010  . Osteoarthritis 09/28/2010  . Tobacco abuse 09/28/2010   Kerin Perna, PTA 10/18/19 5:23 PM  [place], [place], [address] Phone: [phone]   Fax:  [phone]  Name: Jasmine Mclaughlin MRN: [id number] Date of Birth: 16-Nov-1950

## 2019-10-19 ENCOUNTER — Other Ambulatory Visit: Payer: Self-pay | Admitting: Family Medicine

## 2019-10-19 DIAGNOSIS — Z1231 Encounter for screening mammogram for malignant neoplasm of breast: Secondary | ICD-10-CM

## 2019-10-20 ENCOUNTER — Encounter: Payer: Medicare HMO | Admitting: Rehabilitative and Restorative Service Providers"

## 2019-10-21 NOTE — Progress Notes (Signed)
Please call patient and let them  know their  low dose Ct was read as a Lung RADS 2: nodules that are benign in appearance and behavior with a very low likelihood of becoming a clinically active cancer due to size or lack of growth. Recommendation per radiology is for a repeat LDCT in 12 months. .Please let them  know we will order and schedule their  annual screening scan for 10/2020. Please let them  know there was notation of CAD on their  scan.  Please remind the patient  that this is a non-gated exam therefore degree or severity of disease  cannot be determined. Please have them  follow up with their PCP regarding potential risk factor modification, dietary therapy or pharmacologic therapy if clinically indicated. Pt.  is not  currently on statin therapy. Please place order for annual  screening scan for  10/2020 and fax results to PCP. Thanks so much.  Langley Gauss, please let her know there was a notation of aortic atherosclerosis, in addition to 3 vessel coronary artery disease.She is not on statin therapy. She is followed by cards>> please have her touch base with them to determine need for further cardiology work up. Thanks so much.

## 2019-10-25 ENCOUNTER — Other Ambulatory Visit: Payer: Self-pay | Admitting: *Deleted

## 2019-10-25 DIAGNOSIS — Z87891 Personal history of nicotine dependence: Secondary | ICD-10-CM

## 2019-10-25 DIAGNOSIS — F17210 Nicotine dependence, cigarettes, uncomplicated: Secondary | ICD-10-CM

## 2019-10-27 ENCOUNTER — Other Ambulatory Visit: Payer: Self-pay

## 2019-10-27 ENCOUNTER — Encounter: Payer: Self-pay | Admitting: Rehabilitative and Restorative Service Providers"

## 2019-10-27 ENCOUNTER — Ambulatory Visit (INDEPENDENT_AMBULATORY_CARE_PROVIDER_SITE_OTHER): Payer: Medicare HMO | Admitting: Rehabilitative and Restorative Service Providers"

## 2019-10-27 DIAGNOSIS — R2689 Other abnormalities of gait and mobility: Secondary | ICD-10-CM | POA: Diagnosis not present

## 2019-10-27 DIAGNOSIS — G8929 Other chronic pain: Secondary | ICD-10-CM

## 2019-10-27 DIAGNOSIS — M545 Low back pain, unspecified: Secondary | ICD-10-CM

## 2019-10-27 DIAGNOSIS — R2681 Unsteadiness on feet: Secondary | ICD-10-CM | POA: Diagnosis not present

## 2019-10-27 DIAGNOSIS — M6281 Muscle weakness (generalized): Secondary | ICD-10-CM | POA: Diagnosis not present

## 2019-10-27 NOTE — Therapy (Addendum)
[facility], [place], [address] Phone: [phone]   Fax:  [phone]  Physical Therapy Treatment and Discharge Summary  Patient Details  Name: Jasmine Mclaughlin MRN: [id number] Date of Birth: 1950-06-14 Referring Provider (PT): Roma Schanz, MD    PHYSICAL THERAPY DISCHARGE SUMMARY  Visits from Start of Care: 5  Current functional level related to goals / functional outcomes: See goals below-- partially met.   Remaining deficits: See patient status below.    Education / Equipment: HEP and community resources.  Plan: Patient agrees to discharge.  Patient goals were partially met. Patient is being discharged due to meeting the stated rehab goals.  ?????        Encounter Date: 10/27/2019   PT End of Session - 10/27/19 1238    Visit Number 5    Number of Visits 12    Date for PT Re-Evaluation 11/09/19    Authorization Type aetna medicare    PT Start Time [phone]    PT Stop Time 1015    PT Time Calculation (min) 39 min    Activity Tolerance Patient tolerated treatment well    Behavior During Therapy [facility] for tasks assessed/performed           Past Medical History:  Diagnosis Date  . Allergy   . Anxiety   . Arthritis    degenerative back, joint disturbance "everywhere "  . Asthma   . Chest tightness 10/27/2013  . Colon polyps   . COPD (chronic obstructive pulmonary disease) ([place])   . Generalized anxiety disorder 11/13/2012  . Genital warts   . Obesity (BMI 30-39.9) 10/14/2012  . Osteoporosis   . Pneumonia   . Shortness of breath    pt./ reports that she is out of shape  . Sinusitis, acute 10/27/2013  . Tobacco abuse 09/28/2010  . Wheezing 10/27/2013    Past Surgical History:  Procedure Laterality Date  . ANTERIOR FUSION LUMBAR SPINE  05/06/2012  . ANTERIOR LAT LUMBAR FUSION N/A 05/06/2012   Procedure: ANTERIOR LATERAL LUMBAR FUSION 3 LEVELS;  Surgeon: Sinclair Ship,  MD;  Location: [place];  Service: Orthopedics;  Laterality: N/A;  Lateral interbody fusion, lumbar 2-3,lumbar 3-4, lumbar 4-5 with instrumentation and allograft.  . COLONOSCOPY    . COLONOSCOPY WITH PROPOFOL N/A 12/15/2017   Procedure: COLONOSCOPY WITH PROPOFOL;  Surgeon: Rush Landmark Telford Nab., MD;  Location: [place];  Service: Gastroenterology;  Laterality: N/A;  . COLONOSCOPY WITH PROPOFOL N/A 07/08/2018   Procedure: COLONOSCOPY WITH PROPOFOL;  Surgeon: Rush Landmark Telford Nab., MD;  Location: [place];  Service: Gastroenterology;  Laterality: N/A;  . COLONOSCOPY WITH PROPOFOL N/A 09/01/2019   Procedure: COLONOSCOPY WITH PROPOFOL;  Surgeon: Rush Landmark Telford Nab., MD;  Location: [facility];  Service: Gastroenterology;  Laterality: N/A;  . KNEE ARTHROSCOPY Right     R knee 01/2012, for ligament tears  . POLYPECTOMY  07/08/2018   Procedure: POLYPECTOMY;  Surgeon: Mansouraty, Telford Nab., MD;  Location: [place];  Service: Gastroenterology;;  . POLYPECTOMY  09/01/2019   Procedure: POLYPECTOMY;  Surgeon: Irving Copas., MD;  Location: [facility];  Service: Gastroenterology;;  . TONSILLECTOMY    . VAGINAL DELIVERY     x1    There were no vitals filed for this visit.   Subjective Assessment - 10/27/19 0937    Subjective The patient reports her balance is improving and she notes feeling stronger.  She has not had further falls.    Pertinent History  2014 spinal surgery (lumbar), COPD, DDD lumbar sprine, arthroscopic R knee surgery    Patient Stated Goals Reduction of falls, reducing pain level    Currently in Pain? Yes    Pain Score 5     Pain Location Back    Pain Orientation Lower    Pain Descriptors / Indicators Aching    Pain Type Chronic pain    Pain Onset More than a month ago    Pain Frequency Intermittent    Aggravating Factors  damp weather, lifting things    Pain Relieving Factors sitting, rest, laying down              [facility] PT Assessment - 10/27/19  0939      Assessment   Medical Diagnosis Frequent falls, DDD lumbar spine    Referring Provider (PT) Roma Schanz, MD    Onset Date/Surgical Date 09/23/19    Hand Dominance Left    Next MD Visit 12/21      Functional Gait  Assessment   Gait assessed  Yes    Gait Level Surface Walks 20 ft, slow speed, abnormal gait pattern, evidence for imbalance or deviates 10-15 in outside of the 12 in walkway width. Requires more than 7 sec to ambulate 20 ft.   7.4 seconds   Change in Gait Speed Able to smoothly change walking speed without loss of balance or gait deviation. Deviate no more than 6 in outside of the 12 in walkway width.    Gait with Horizontal Head Turns Performs head turns smoothly with slight change in gait velocity (eg, minor disruption to smooth gait path), deviates 6-10 in outside 12 in walkway width, or uses an assistive device.    Gait with Vertical Head Turns Performs task with slight change in gait velocity (eg, minor disruption to smooth gait path), deviates 6 - 10 in outside 12 in walkway width or uses assistive device    Gait and Pivot Turn Pivot turns safely within 3 sec and stops quickly with no loss of balance.    Step Over Obstacle Is able to step over one shoe box (4.5 in total height) without changing gait speed. No evidence of imbalance.    Gait with Narrow Base of Support Is able to ambulate for 10 steps heel to toe with no staggering.    Gait with Eyes Closed Walks 20 ft, no assistive devices, good speed, no evidence of imbalance, normal gait pattern, deviates no more than 6 in outside 12 in walkway width. Ambulates 20 ft in less than 7 sec.    Ambulating Backwards Walks 20 ft, no assistive devices, good speed, no evidence for imbalance, normal gait    Steps Alternating feet, must use rail.    Total Score 24    FGA comment: 24/30                         [facility] Adult PT Treatment/Exercise - 10/27/19 0939      Ambulation/Gait   Ambulation/Gait Yes     Ambulation/Gait Assistance 7: Independent    Gait Comments With dynamic gait activities      Exercises   Exercises Knee/Hip;Lumbar      Lumbar Exercises: Stretches   Active Hamstring Stretch Right;Left;1 rep;30 seconds    Piriformis Stretch Right;Left;30 seconds;1 rep      Lumbar Exercises: Aerobic   Nustep level 4 x 4 minutes      Knee/Hip Exercises: Standing   Hip Flexion Stengthening;Right;Left  Hip Flexion Limitations standing marching near counter top for support; standing with cues to engage core musculture before marching to reduce back pain    SLS tandem stance and single leg support near support surface                  PT Education - 10/27/19 1238    Education Details discussed transition to community exercise class    Person(s) Educated Patient    Methods Explanation;Demonstration;Handout    Comprehension Verbalized understanding;Returned demonstration               PT Long Term Goals - 10/27/19 1001      PT LONG TERM GOAL #1   Title The patient will be indep with HEP.    Time 6    Period Weeks    Status On-going      PT LONG TERM GOAL #2   Title The patient will improve Berg balance scale from 48/56 to > or equal to 52/56.    Time 6    Period Weeks    Status Achieved      PT LONG TERM GOAL #3   Title The patient will improve FGA from 16/30 to > or equal to 22/30 to demo dec'd risk for falls.    Baseline 24/30 on 10/27/19    Time 6    Period Weeks    Status Achieved      PT LONG TERM GOAL #4   Title The patient will move floor<>stand due to h/o falls with UE support through chair/support surface mod indep.    Time 6    Period Weeks    Status Achieved      PT LONG TERM GOAL #5   Title The patient will report pain < or equal to 2/10 at rest in low back.    Time 6    Period Weeks    Status On-going                 Plan - 10/27/19 1239    Clinical Impression Statement The patient has met LTGs for Berg, FGA, floor  transfers.  She is going to work on [employer], and call [employer] in her area to transition to community program.  PT anticipates not meeting LBP goal as LBP was more chronic in nature than understood at eval. Plan to d/c next visit.    Comorbidities COPD, osteoporosis, obesity    Rehab Potential Good    PT Frequency 2x / week    PT Duration 6 weeks    PT Treatment/Interventions ADLs/Self Care Home Management;Taping;Gait training;Stair training;Functional mobility training;Therapeutic activities;Therapeutic exercise;Balance training;Neuromuscular re-education;Manual techniques;Patient/family education;Electrical Stimulation;Moist Heat;Vestibular;Dry needling    PT Next Visit Plan check on HEP progression, [employer] programs and d/c.    PT Home Exercise Plan [id number]    Consulted and Agree with Plan of Care Patient           Patient will benefit from skilled therapeutic intervention in order to improve the following deficits and impairments:  Pain, Decreased mobility, Decreased strength, Abnormal gait, Decreased balance, Impaired flexibility, Difficulty walking  Visit Diagnosis: Other abnormalities of gait and mobility  Muscle weakness (generalized)  Unsteadiness on feet  Chronic bilateral low back pain without sciatica     Problem List Patient Active Problem List   Diagnosis Date Noted  . Atypical chest pain 08/25/2019  . Elevated blood pressure reading 08/25/2019  . Tobacco use 08/25/2019  . Shortness of breath   . Pneumonia   .  Osteoporosis   . Genital warts   . Colon polyps   . Arthritis   . Anxiety   . Allergy   . Tubular adenoma of colon 11/18/2017  . Allergic rhinitis 01/24/2015  . Acute bronchitis 03/07/2014  . COPD (chronic obstructive pulmonary disease) ([place]) 11/22/2013  . Wheezing 10/27/2013  . Chest tightness 10/27/2013  . Sinusitis, acute 10/27/2013  . Generalized anxiety disorder 11/13/2012  . Degenerative disc disease, lumbar 10/14/2012  . Obesity (BMI 30-39.9)  10/14/2012  . Sinusitis 01/10/2012  . History of colonic polyps 09/28/2010  . Osteoarthritis 09/28/2010  . Tobacco abuse 09/28/2010    Elbie Statzer, PT 10/27/2019, 12:41 PM  [facility], [place], [address] Phone: [phone]   Fax:  [phone]  Name: Jasmine Mclaughlin MRN: [id number] Date of Birth: [birth year]

## 2019-10-29 ENCOUNTER — Encounter: Payer: Medicare HMO | Admitting: Rehabilitative and Restorative Service Providers"

## 2019-11-08 ENCOUNTER — Encounter: Payer: Medicare HMO | Admitting: Rehabilitative and Restorative Service Providers"

## 2019-11-08 ENCOUNTER — Other Ambulatory Visit: Payer: Self-pay

## 2019-11-09 ENCOUNTER — Ambulatory Visit: Payer: Medicare HMO | Admitting: Cardiology

## 2019-11-29 ENCOUNTER — Other Ambulatory Visit: Payer: Self-pay

## 2019-11-29 ENCOUNTER — Ambulatory Visit
Admission: RE | Admit: 2019-11-29 | Discharge: 2019-11-29 | Disposition: A | Discharge status: Home / Self Care | Payer: Medicare HMO | Source: Ambulatory Visit | Attending: Family Medicine | Admitting: Family Medicine

## 2019-11-29 DIAGNOSIS — Z1231 Encounter for screening mammogram for malignant neoplasm of breast: Secondary | ICD-10-CM

## 2019-11-29 IMAGING — MG DIGITAL SCREENING BILAT W/ TOMO W/ CAD
8 series · 8 of 24 positions shown · non-contrast
Comparison: Previous exam(s).

CLINICAL DATA: Screening.

EXAM:
DIGITAL SCREENING BILATERAL MAMMOGRAM WITH TOMO AND CAD

[L MLO synth-2D]
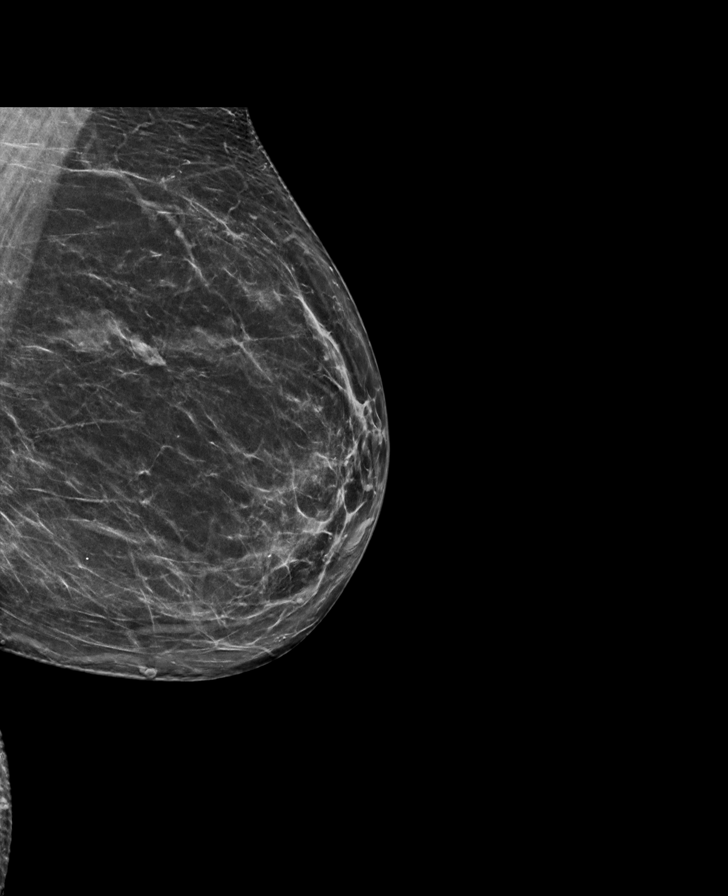

[L CC synth-2D]
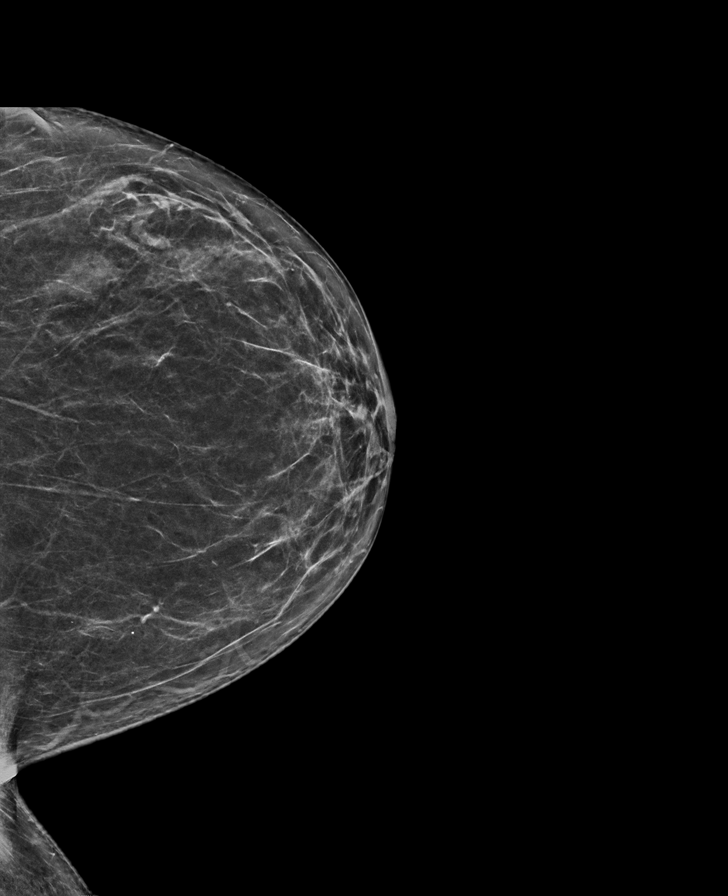

[R CC synth-2D]
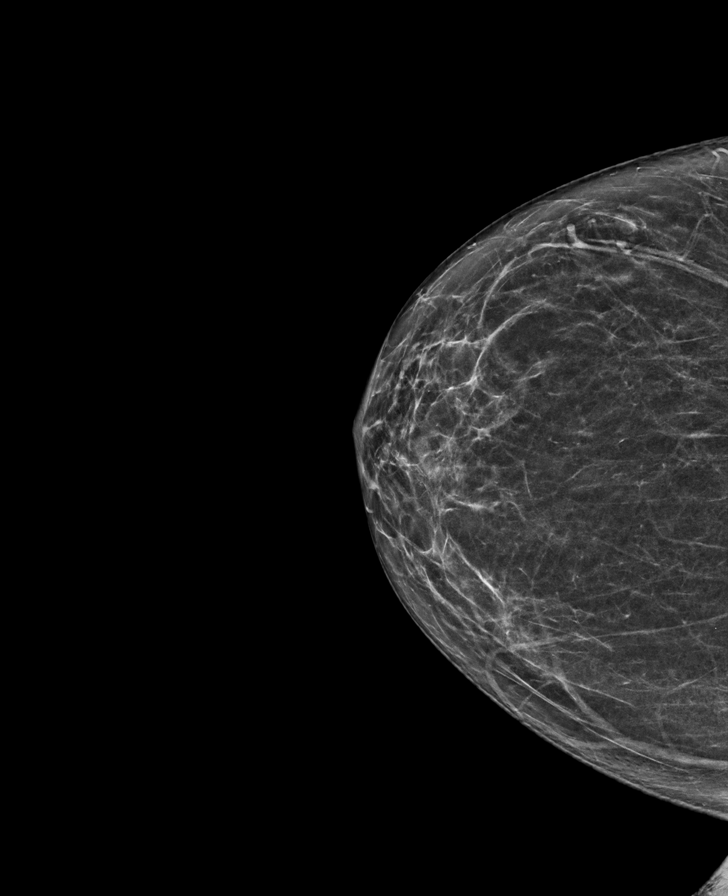

[R MLO synth-2D]
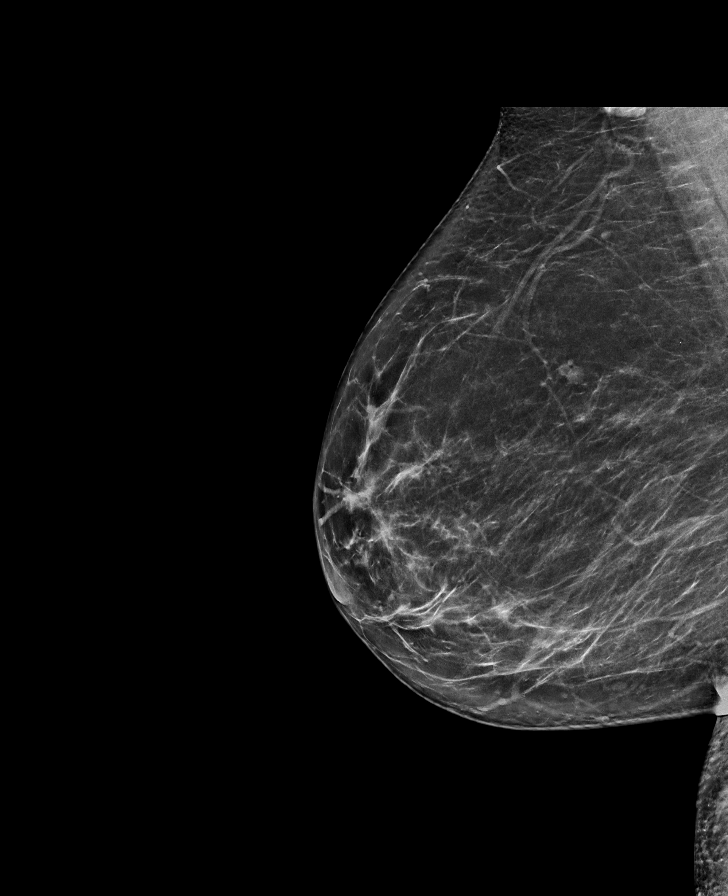

[L MLO tomo · tomo slice 41/80.0]
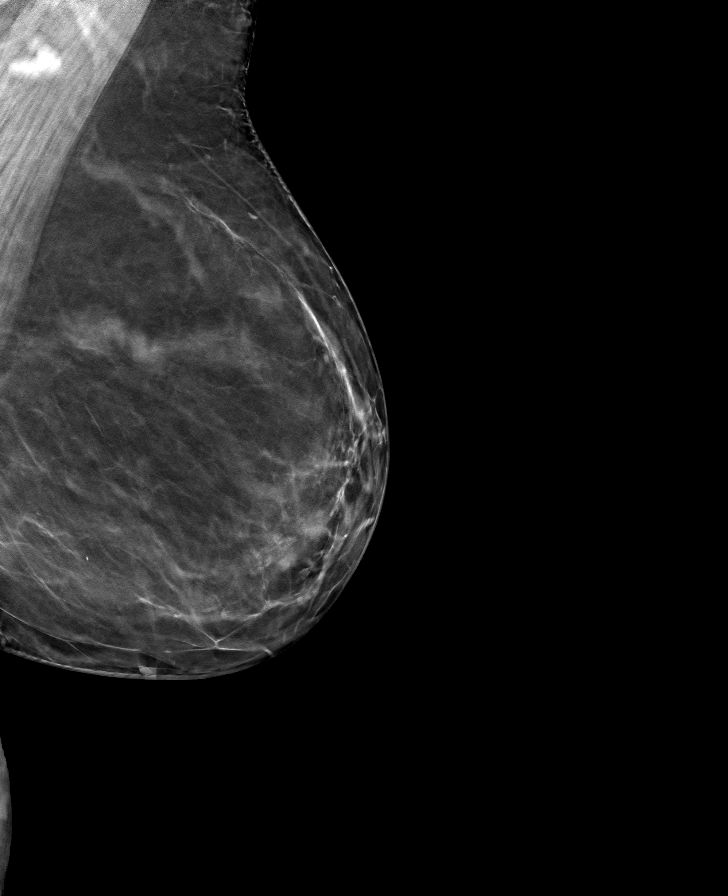

[L CC tomo · tomo slice 36/71.0]
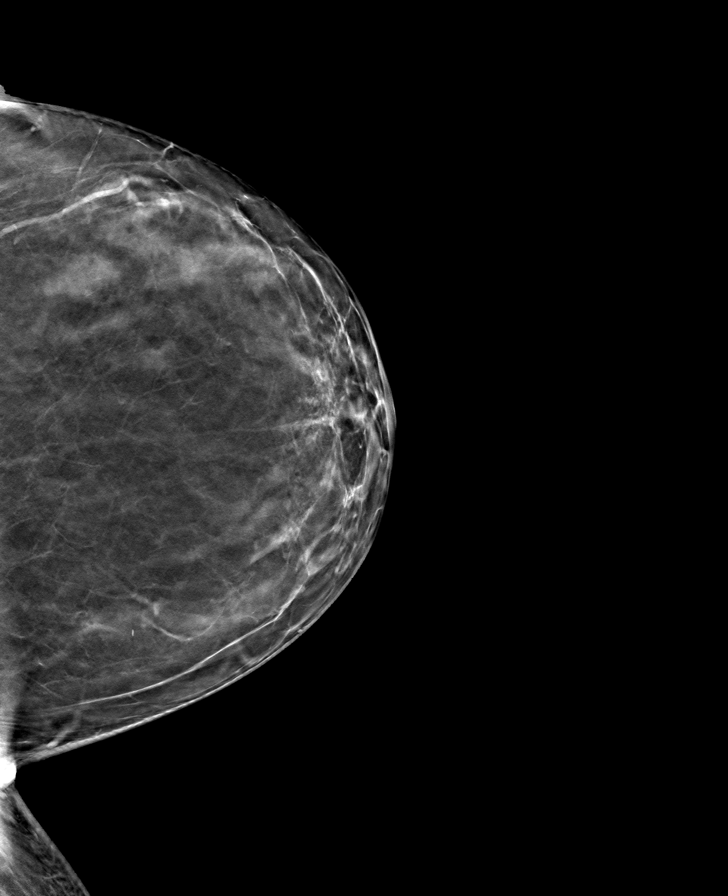

[R MLO tomo · tomo slice 40/79.0]
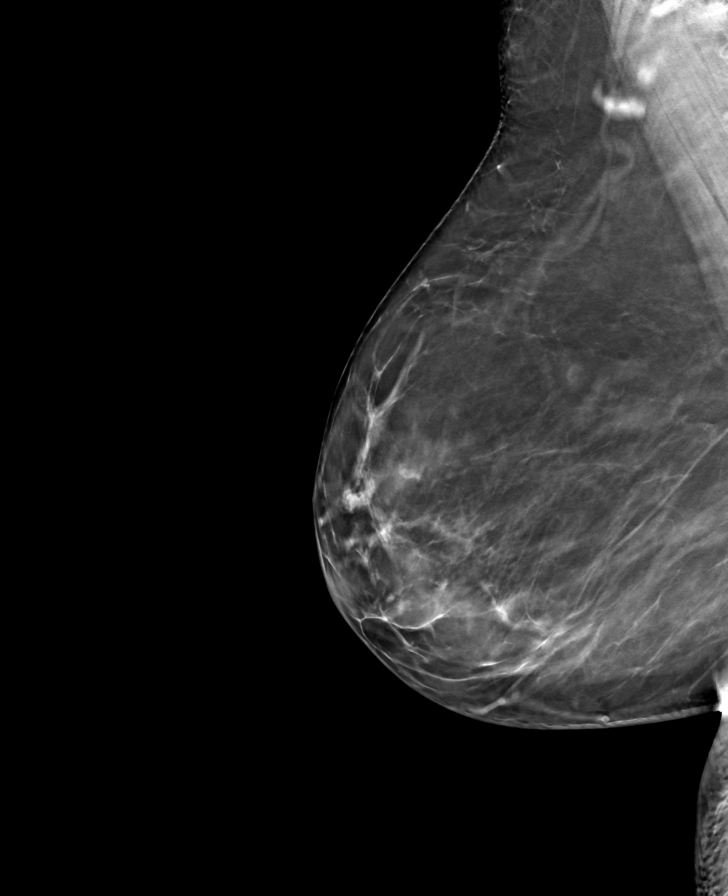

[R CC tomo · tomo slice 35/69.0]
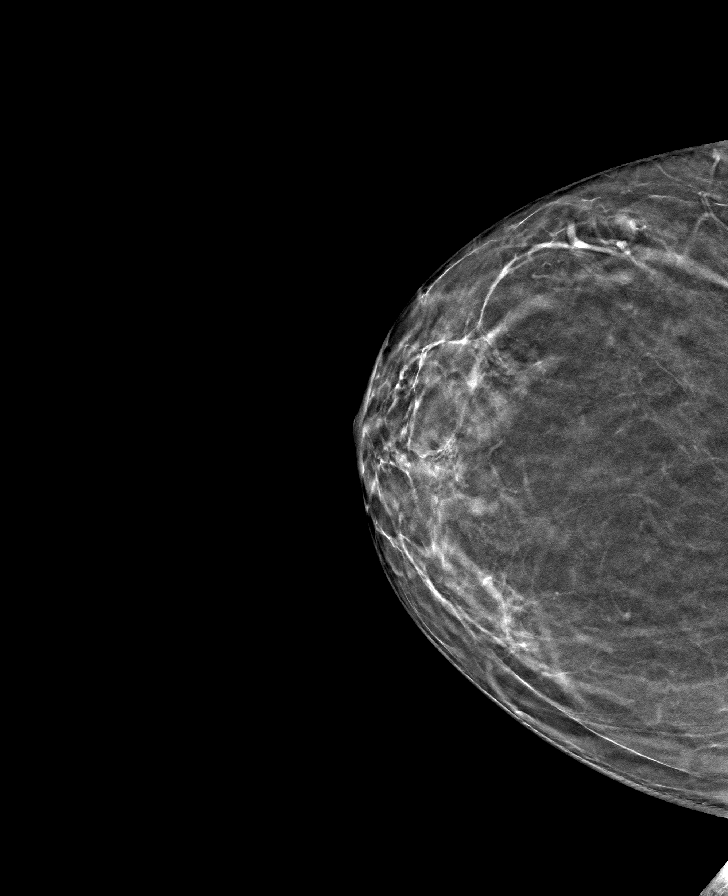

[8 of 24 positions shown; findings below may reference images not displayed]

ACR Breast Density Category b: There are scattered areas of
fibroglandular density.
FINDINGS: There are no findings suspicious for malignancy. Images were
processed with CAD.
IMPRESSION: No mammographic evidence of malignancy. A result letter of this
screening mammogram will be mailed directly to the patient.

RECOMMENDATION:
Screening mammogram in one year. (Code:[TQ])

BI-RADS CATEGORY  1: Negative.

## 2019-11-30 ENCOUNTER — Encounter: Payer: Self-pay | Admitting: Family Medicine

## 2019-11-30 ENCOUNTER — Telehealth (INDEPENDENT_AMBULATORY_CARE_PROVIDER_SITE_OTHER): Payer: Medicare HMO | Admitting: Family Medicine

## 2019-11-30 DIAGNOSIS — R197 Diarrhea, unspecified: Secondary | ICD-10-CM

## 2019-11-30 MED ORDER — DICYCLOMINE HCL 10 MG PO CAPS: 10.0000 mg | ORAL_CAPSULE | Freq: Three times a day (TID) | ORAL | 1 refills | Status: DC

## 2019-11-30 NOTE — Progress Notes (Signed)
Virtual Visit via Video Note  I connected with Jasmine Mclaughlin on 11/30/19 at  4:20 PM EST by a video enabled telemedicine application and verified that I am speaking with the correct person using two identifiers.  Location: Patient: home alone  Provider: office    I discussed the limitations of evaluation and management by telemedicine and the availability of in person appointments. The patient expressed understanding and agreed to proceed.  History of Present Illness: Pt is home c/o diarrhea --- some abd cramping , no blood that started 1 week ago  No new foods, or meds-- no antibiotics      She did not go out to eat or eat anything that could have caused it  She has had all 3 covid shots No n/v Observations/Objective: There were no vitals filed for this visit. No fever  Pt is in nad  Assessment and Plan: 1. Diarrhea, unspecified type covid test-- ok to do rapid  Labs tomorrow  - dicyclomine (BENTYL) 10 MG capsule; Take 1 capsule (10 mg total) by mouth 4 (four) times daily -  before meals and at bedtime.  Dispense: 20 capsule; Refill: 1 - CBC with Differential/Platelet; Future - Comprehensive metabolic panel; Future - Clostridium difficile EIA; Future - Stool Culture; Future   Follow Up Instructions:    I discussed the assessment and treatment plan with the patient. The patient was provided an opportunity to ask questions and all were answered. The patient agreed with the plan and demonstrated an understanding of the instructions.   The patient was advised to call back or seek an in-person evaluation if the symptoms worsen or if the condition fails to improve as anticipated.  I provided 25 minutes of non-face-to-face time during this encounter.   Ann Held, DO

## 2019-12-01 ENCOUNTER — Other Ambulatory Visit: Payer: Self-pay

## 2019-12-01 ENCOUNTER — Other Ambulatory Visit (INDEPENDENT_AMBULATORY_CARE_PROVIDER_SITE_OTHER): Payer: Medicare HMO

## 2019-12-01 DIAGNOSIS — R197 Diarrhea, unspecified: Secondary | ICD-10-CM

## 2019-12-01 LAB — COMPREHENSIVE METABOLIC PANEL
ALT: 10 U/L (ref 0–35)
AST: 13 U/L (ref 0–37)
Albumin: 4.2 g/dL (ref 3.5–5.2)
Alkaline Phosphatase: 61 U/L (ref 39–117)
BUN: 17 mg/dL (ref 6–23)
CO2: 29 mEq/L (ref 19–32)
Calcium: 9.2 mg/dL (ref 8.4–10.5)
Chloride: 104 mEq/L (ref 96–112)
Creatinine, Ser: 0.72 mg/dL (ref 0.40–1.20)
GFR: 85.87 mL/min (ref >60.00)
Glucose, Bld: 100 mg/dL — ABNORMAL HIGH (ref 70–99)
Potassium: 4.8 mEq/L (ref 3.5–5.1)
Sodium: 138 mEq/L (ref 135–145)
Total Bilirubin: 0.5 mg/dL (ref 0.2–1.2)
Total Protein: 6.9 g/dL (ref 6.0–8.3)

## 2019-12-01 LAB — CBC WITH DIFFERENTIAL/PLATELET
Basophils Absolute: 0.1 10*3/uL (ref 0.0–0.1)
Basophils Relative: 1 % (ref 0.0–3.0)
Eosinophils Absolute: 0.3 10*3/uL (ref 0.0–0.7)
Eosinophils Relative: 3.4 % (ref 0.0–5.0)
HCT: 40.8 % (ref 36.0–46.0)
Hemoglobin: 13.8 g/dL (ref 12.0–15.0)
Lymphocytes Relative: 30 % (ref 12.0–46.0)
Lymphs Abs: 2.5 10*3/uL (ref 0.7–4.0)
MCHC: 33.7 g/dL (ref 30.0–36.0)
MCV: 99.3 fl (ref 78.0–100.0)
Monocytes Absolute: 0.7 10*3/uL (ref 0.1–1.0)
Monocytes Relative: 8 % (ref 3.0–12.0)
Neutro Abs: 4.8 10*3/uL (ref 1.4–7.7)
Neutrophils Relative %: 57.6 % (ref 43.0–77.0)
Platelets: 335 10*3/uL (ref 150.0–400.0)
RBC: 4.11 Mil/uL (ref 3.87–5.11)
RDW: 13.9 % (ref 11.5–15.5)
WBC: 8.4 10*3/uL (ref 4.0–10.5)

## 2019-12-06 ENCOUNTER — Other Ambulatory Visit (INDEPENDENT_AMBULATORY_CARE_PROVIDER_SITE_OTHER): Payer: Medicare HMO

## 2019-12-06 ENCOUNTER — Ambulatory Visit: Payer: Medicare HMO | Admitting: Family Medicine

## 2019-12-06 ENCOUNTER — Other Ambulatory Visit: Payer: Self-pay

## 2019-12-06 DIAGNOSIS — R197 Diarrhea, unspecified: Secondary | ICD-10-CM | POA: Diagnosis not present

## 2019-12-08 ENCOUNTER — Encounter: Payer: Self-pay | Admitting: Family Medicine

## 2019-12-08 LAB — CLOSTRIDIUM DIFFICILE EIA: C difficile Toxins A+B, EIA: NEGATIVE

## 2019-12-09 LAB — SALMONELLA/SHIGELLA CULT, CAMPY EIA AND SHIGA TOXIN RFL ECOLI
MICRO NUMBER: 11251909
MICRO NUMBER:: 11251910
MICRO NUMBER:: 11251911
Result:: NOT DETECTED
SHIGA RESULT:: NOT DETECTED
SPECIMEN QUALITY: ADEQUATE
SPECIMEN QUALITY:: ADEQUATE
SPECIMEN QUALITY:: ADEQUATE

## 2019-12-11 ENCOUNTER — Other Ambulatory Visit: Payer: Self-pay | Admitting: Family Medicine

## 2019-12-11 DIAGNOSIS — R195 Other fecal abnormalities: Secondary | ICD-10-CM

## 2019-12-13 ENCOUNTER — Ambulatory Visit: Payer: Medicare HMO | Admitting: Neurology

## 2019-12-14 NOTE — Telephone Encounter (Signed)
FYI

## 2019-12-23 ENCOUNTER — Other Ambulatory Visit: Payer: Self-pay

## 2019-12-23 ENCOUNTER — Encounter: Payer: Self-pay | Admitting: Family Medicine

## 2019-12-23 ENCOUNTER — Ambulatory Visit (INDEPENDENT_AMBULATORY_CARE_PROVIDER_SITE_OTHER): Payer: Medicare HMO | Admitting: Family Medicine

## 2019-12-23 VITALS — BP 140/80 | HR 71 | Temp 98.0°F | Resp 18 | Ht 65.0 in | Wt 202.2 lb

## 2019-12-23 DIAGNOSIS — J0140 Acute pansinusitis, unspecified: Secondary | ICD-10-CM | POA: Diagnosis not present

## 2019-12-23 DIAGNOSIS — E559 Vitamin D deficiency, unspecified: Secondary | ICD-10-CM

## 2019-12-23 DIAGNOSIS — M62838 Other muscle spasm: Secondary | ICD-10-CM | POA: Diagnosis not present

## 2019-12-23 DIAGNOSIS — J0100 Acute maxillary sinusitis, unspecified: Secondary | ICD-10-CM | POA: Diagnosis not present

## 2019-12-23 DIAGNOSIS — I10 Essential (primary) hypertension: Secondary | ICD-10-CM

## 2019-12-23 DIAGNOSIS — J449 Chronic obstructive pulmonary disease, unspecified: Secondary | ICD-10-CM | POA: Diagnosis not present

## 2019-12-23 HISTORY — DX: Essential (primary) hypertension: I10

## 2019-12-23 HISTORY — DX: Vitamin D deficiency, unspecified: E55.9

## 2019-12-23 LAB — COMPREHENSIVE METABOLIC PANEL
ALT: 11 U/L (ref 0–35)
AST: 12 U/L (ref 0–37)
Albumin: 4.3 g/dL (ref 3.5–5.2)
Alkaline Phosphatase: 62 U/L (ref 39–117)
BUN: 17 mg/dL (ref 6–23)
CO2: 30 mEq/L (ref 19–32)
Calcium: 10 mg/dL (ref 8.4–10.5)
Chloride: 100 mEq/L (ref 96–112)
Creatinine, Ser: 0.78 mg/dL (ref 0.40–1.20)
GFR: 77.97 mL/min (ref >60.00)
Glucose, Bld: 94 mg/dL (ref 70–99)
Potassium: 4.6 mEq/L (ref 3.5–5.1)
Sodium: 137 mEq/L (ref 135–145)
Total Bilirubin: 0.4 mg/dL (ref 0.2–1.2)
Total Protein: 7.1 g/dL (ref 6.0–8.3)

## 2019-12-23 LAB — LIPID PANEL
Cholesterol: 219 mg/dL — ABNORMAL HIGH (ref 0–200)
HDL: 53.9 mg/dL (ref >39.00)
NonHDL: 165.15
Total CHOL/HDL Ratio: 4
Triglycerides: 209 mg/dL — ABNORMAL HIGH (ref 0.0–149.0)
VLDL: 41.8 mg/dL — ABNORMAL HIGH (ref 0.0–40.0)

## 2019-12-23 LAB — VITAMIN D 25 HYDROXY (VIT D DEFICIENCY, FRACTURES): VITD: 31.21 ng/mL (ref 30.00–100.00)

## 2019-12-23 LAB — LDL CHOLESTEROL, DIRECT: Direct LDL: 135 mg/dL

## 2019-12-23 MED ORDER — TIZANIDINE HCL 4 MG PO CAPS: 4.0000 mg | ORAL_CAPSULE | Freq: Three times a day (TID) | ORAL | 1 refills | Status: DC | PRN

## 2019-12-23 MED ORDER — FLUTICASONE PROPIONATE 50 MCG/ACT NA SUSP: 2.0000 | Freq: Every day | NASAL | 6 refills | Status: DC

## 2019-12-23 MED ORDER — AMOXICILLIN-POT CLAVULANATE 875-125 MG PO TABS
1.0000 | ORAL_TABLET | Freq: Two times a day (BID) | ORAL | 0 refills | Status: DC
Start: 2019-12-23 — End: 2020-01-14

## 2019-12-23 NOTE — Assessment & Plan Note (Signed)
Recheck vita d

## 2019-12-23 NOTE — Assessment & Plan Note (Signed)
C/o inhalers  Call if symptoms worsen

## 2019-12-23 NOTE — Patient Instructions (Signed)
Sinusitis, Adult Sinusitis is inflammation of your sinuses. Sinuses are hollow spaces in the bones around your face. Your sinuses are located:  Around your eyes.  In the middle of your forehead.  Behind your nose.  In your cheekbones. Mucus normally drains out of your sinuses. When your nasal tissues become inflamed or swollen, mucus can become trapped or blocked. This allows bacteria, viruses, and fungi to grow, which leads to infection. Most infections of the sinuses are caused by a virus. Sinusitis can develop quickly. It can last for up to 4 weeks (acute) or for more than 12 weeks (chronic). Sinusitis often develops after a cold. What are the causes? This condition is caused by anything that creates swelling in the sinuses or stops mucus from draining. This includes:  Allergies.  Asthma.  Infection from bacteria or viruses.  Deformities or blockages in your nose or sinuses.  Abnormal growths in the nose (nasal polyps).  Pollutants, such as chemicals or irritants in the air.  Infection from fungi (rare). What increases the risk? You are more likely to develop this condition if you:  Have a weak body defense system (immune system).  Do a lot of swimming or diving.  Overuse nasal sprays.  Smoke. What are the signs or symptoms? The main symptoms of this condition are pain and a feeling of pressure around the affected sinuses. Other symptoms include:  Stuffy nose or congestion.  Thick drainage from your nose.  Swelling and warmth over the affected sinuses.  Headache.  Upper toothache.  A cough that may get worse at night.  Extra mucus that collects in the throat or the back of the nose (postnasal drip).  Decreased sense of smell and taste.  Fatigue.  A fever.  Sore throat.  Bad breath. How is this diagnosed? This condition is diagnosed based on:  Your symptoms.  Your medical history.  A physical exam.  Tests to find out if your condition is  acute or chronic. This may include: ? Checking your nose for nasal polyps. ? Viewing your sinuses using a device that has a light (endoscope). ? Testing for allergies or bacteria. ? Imaging tests, such as an MRI or CT scan. In rare cases, a bone biopsy may be done to rule out more serious types of fungal sinus disease. How is this treated? Treatment for sinusitis depends on the cause and whether your condition is chronic or acute.  If caused by a virus, your symptoms should go away on their own within 10 days. You may be given medicines to relieve symptoms. They include: ? Medicines that shrink swollen nasal passages (topical intranasal decongestants). ? Medicines that treat allergies (antihistamines). ? A spray that eases inflammation of the nostrils (topical intranasal corticosteroids). ? Rinses that help get rid of thick mucus in your nose (nasal saline washes).  If caused by bacteria, your health care provider may recommend waiting to see if your symptoms improve. Most bacterial infections will get better without antibiotic medicine. You may be given antibiotics if you have: ? A severe infection. ? A weak immune system.  If caused by narrow nasal passages or nasal polyps, you may need to have surgery. Follow these instructions at home: Medicines  Take, use, or apply over-the-counter and prescription medicines only as told by your health care provider. These may include nasal sprays.  If you were prescribed an antibiotic medicine, take it as told by your health care provider. Do not stop taking the antibiotic even if you start   to feel better. Hydrate and humidify   Drink enough fluid to keep your urine pale yellow. Staying hydrated will help to thin your mucus.  Use a cool mist humidifier to keep the humidity level in your home above 50%.  Inhale steam for 10-15 minutes, 3-4 times a day, or as told by your health care provider. You can do this in the bathroom while a hot shower is  running.  Limit your exposure to cool or dry air. Rest  Rest as much as possible.  Sleep with your head raised (elevated).  Make sure you get enough sleep each night. General instructions   Apply a warm, moist washcloth to your face 3-4 times a day or as told by your health care provider. This will help with discomfort.  Wash your hands often with soap and water to reduce your exposure to germs. If soap and water are not available, use hand sanitizer.  Do not smoke. Avoid being around people who are smoking (secondhand smoke).  Keep all follow-up visits as told by your health care provider. This is important. Contact a health care provider if:  You have a fever.  Your symptoms get worse.  Your symptoms do not improve within 10 days. Get help right away if:  You have a severe headache.  You have persistent vomiting.  You have severe pain or swelling around your face or eyes.  You have vision problems.  You develop confusion.  Your neck is stiff.  You have trouble breathing. Summary  Sinusitis is soreness and inflammation of your sinuses. Sinuses are hollow spaces in the bones around your face.  This condition is caused by nasal tissues that become inflamed or swollen. The swelling traps or blocks the flow of mucus. This allows bacteria, viruses, and fungi to grow, which leads to infection.  If you were prescribed an antibiotic medicine, take it as told by your health care provider. Do not stop taking the antibiotic even if you start to feel better.  Keep all follow-up visits as told by your health care provider. This is important. This information is not intended to replace advice given to you by your health care provider. Make sure you discuss any questions you have with your health care provider. Document Revised: 05/26/2017 Document Reviewed: 05/26/2017 Elsevier Patient Education  2020 [employer]  

## 2019-12-23 NOTE — Assessment & Plan Note (Signed)
abx per orders  flonase and con't antihistamine  Call if symptoms persist

## 2019-12-23 NOTE — Progress Notes (Signed)
Patient ID: Jasmine Mclaughlin, female    DOB: 05-25-1950  Age: 69 y.o. MRN: [id number]    Subjective:  Subjective  HPI Jasmine Mclaughlin presents for f/u bp chol and vita d.   She also c/o  runny nose and sinus congestion once she got back in the room   No fevers -- no covid test but she has had all 3 vaccines.     Review of Systems  Constitutional: Negative for appetite change, diaphoresis, fatigue and unexpected weight change.  HENT: Positive for congestion, postnasal drip, rhinorrhea, sinus pressure and sinus pain. Negative for ear discharge, ear pain, facial swelling, sneezing, sore throat and trouble swallowing.   Eyes: Negative for pain, redness and visual disturbance.  Respiratory: Negative for cough, chest tightness, shortness of breath and wheezing.   Cardiovascular: Negative for chest pain, palpitations and leg swelling.  Endocrine: Negative for cold intolerance, heat intolerance, polydipsia, polyphagia and polyuria.  Genitourinary: Negative for difficulty urinating, dysuria and frequency.  Neurological: Negative for dizziness, light-headedness, numbness and headaches.    History Past Medical History:  Diagnosis Date  . Allergy   . Anxiety   . Arthritis    degenerative back, joint disturbance "everywhere "  . Asthma   . Chest tightness 10/27/2013  . Jasmine polyps   . COPD (chronic obstructive pulmonary disease) ([place])   . Generalized anxiety disorder 11/13/2012  . Genital warts   . Obesity (BMI 30-39.9) 10/14/2012  . Osteoporosis   . Pneumonia   . Shortness of breath    pt./ reports that she is out of shape  . Sinusitis, acute 10/27/2013  . Tobacco abuse 09/28/2010  . Wheezing 10/27/2013    She has a past surgical history that includes Knee arthroscopy (Right); Tonsillectomy; Vaginal delivery; Anterior fusion lumbar spine (05/06/2012); Anterior lat lumbar fusion (N/A, 05/06/2012); Colonoscopy; Colonoscopy with propofol (N/A, 12/15/2017); Colonoscopy with propofol (N/A, 07/08/2018);  polypectomy (07/08/2018); Colonoscopy with propofol (N/A, 09/01/2019); and polypectomy (09/01/2019).   Her family history includes Arthritis in her father and mother; Asthma in her daughter and maternal grandmother; Breast cancer in her maternal grandmother; Jasmine cancer (age of onset: 3) in her father; Heart disease (age of onset: 72) in her father; Stroke in her father.She reports that she has been smoking cigarettes. She has a 27.50 pack-year smoking history. She has never used smokeless tobacco. She reports current alcohol use. She reports that she does not use drugs.  Current Outpatient Medications on File Prior to Visit  Medication Sig Dispense Refill  . acetaminophen (TYLENOL) 500 MG tablet Take 1,000 mg by mouth 2 (two) times daily.    Marland Kitchen albuterol (VENTOLIN HFA) 108 (90 Base) MCG/ACT inhaler Inhale 2 puffs into the lungs every 6 (six) hours as needed for wheezing or shortness of breath. 1 each 5  . alendronate (FOSAMAX) 70 MG tablet TAKE 1 TABLET BY MOUTH EVERY 7 DAYS. TAKE WITH A FULL GLASS OF WATER ON AN EMPTY STOMACH. 4 tablet 2  . chlorpheniramine (CHLOR-TRIMETON) 4 MG tablet Take 4 mg by mouth daily as needed for allergies.    . citalopram (CELEXA) 20 MG tablet TAKE 1 TABLET BY MOUTH EVERY DAY 90 tablet 1  . dicyclomine (BENTYL) 10 MG capsule Take 1 capsule (10 mg total) by mouth 4 (four) times daily -  before meals and at bedtime. 20 capsule 1  . diphenhydrAMINE (SOMINEX) 25 MG tablet Take 25 mg by mouth at bedtime.    . Fluticasone Propionate, Inhal, (FLOVENT DISKUS) 100 MCG/BLIST AEPB 1  inh bid 1 each 5  . ULTRAM 50 MG tablet Take 50 mg by mouth every 6 (six) hours as needed for moderate pain.     . Vitamin D, Ergocalciferol, (DRISDOL) 1.25 MG (50000 UNIT) CAPS capsule Take 1 capsule (50,000 Units total) by mouth every 7 (seven) days. 12 capsule 1  . nitroGLYCERIN (NITROSTAT) 0.4 MG SL tablet Place 1 tablet (0.4 mg total) under the tongue every 5 (five) minutes as needed. (Patient taking  differently: Place 0.4 mg under the tongue every 5 (five) minutes as needed for chest pain. ) 30 tablet 3   No current facility-administered medications on file prior to visit.     Objective:  Objective  Physical Exam Vitals and nursing note reviewed.  Constitutional:      Appearance: She is well-nourished. She is not diaphoretic.  HENT:     Nose: Sinus tenderness, mucosal edema and rhinorrhea present. No nasal deformity.     Right Sinus: Maxillary sinus tenderness and frontal sinus tenderness present.     Left Sinus: Maxillary sinus tenderness and frontal sinus tenderness present.     Mouth/Throat:     Mouth: Oropharynx is clear and moist and mucous membranes are normal.     Pharynx: No oropharyngeal exudate.  Cardiovascular:     Rate and Rhythm: Normal rate and regular rhythm.     Heart sounds: Normal heart sounds. No murmur heard.   Pulmonary:     Effort: Pulmonary effort is normal. No respiratory distress.     Breath sounds: Normal breath sounds. No rales.  Musculoskeletal:     Cervical back: Normal range of motion and neck supple.  Lymphadenopathy:     Cervical: No cervical adenopathy.  Skin:    General: Skin is warm.  Neurological:     Mental Status: She is alert and oriented to person, place, and time.  Psychiatric:        Mood and Affect: Mood and affect normal.    BP 140/80 (BP Location: Right Arm, Patient Position: Sitting, Cuff Size: Large)   Pulse 71   Temp 98 F (36.7 C) (Oral)   Resp 18   Ht 5\' 5"  (1.651 m)   Wt 202 lb 3.2 oz (91.7 kg)   SpO2 96%   BMI 33.65 kg/m  Wt Readings from Last 3 Encounters:  12/23/19 202 lb 3.2 oz (91.7 kg)  09/23/19 194 lb 12.8 oz (88.4 kg)  09/01/19 194 lb 0.1 oz (88 kg)     Lab Results  Component Value Date   WBC 8.4 12/01/2019   HGB 13.8 12/01/2019   HCT 40.8 12/01/2019   PLT 335.0 12/01/2019   GLUCOSE 100 (H) 12/01/2019   CHOL 199 09/23/2019   TRIG 200 (H) 09/23/2019   HDL 50 09/23/2019   LDLCALC 117 (H)  09/23/2019   ALT 10 12/01/2019   AST 13 12/01/2019   NA 138 12/01/2019   K 4.8 12/01/2019   CL 104 12/01/2019   CREATININE 0.72 12/01/2019   BUN 17 12/01/2019   CO2 29 12/01/2019   TSH 0.95 09/23/2019   INR 1.0 11/14/2017    MM 3D SCREEN BREAST BILATERAL  Result Date: 12/02/2019 CLINICAL DATA:  Screening. EXAM: DIGITAL SCREENING BILATERAL MAMMOGRAM WITH TOMO AND CAD COMPARISON:  Previous exam(s). ACR Breast Density Category b: There are scattered areas of fibroglandular density. FINDINGS: There are no findings suspicious for malignancy. Images were processed with CAD. IMPRESSION: No mammographic evidence of malignancy. A result letter of this screening mammogram will be  mailed directly to the patient. RECOMMENDATION: Screening mammogram in one year. (Code:SM-B-01Y) BI-RADS CATEGORY  1: Negative. Electronically Signed   By: Lajean Manes M.D.   On: 12/02/2019 11:25     Assessment & Plan:  Plan  I am having Jasmine Mclaughlin start on fluticasone and amoxicillin-clavulanate. I am also having her maintain her acetaminophen, nitroGLYCERIN, Ultram, diphenhydrAMINE, chlorpheniramine, albuterol, Flovent Diskus, Vitamin D (Ergocalciferol), citalopram, alendronate, dicyclomine, and tiZANidine.  Meds ordered this encounter  Medications  . tiZANidine (ZANAFLEX) 4 MG capsule    Sig: Take 1 capsule (4 mg total) by mouth 3 (three) times daily as needed for muscle spasms.    Dispense:  30 capsule    Refill:  1  . fluticasone (FLONASE) 50 MCG/ACT nasal spray    Sig: Place 2 sprays into both nostrils daily.    Dispense:  16 g    Refill:  6  . amoxicillin-clavulanate (AUGMENTIN) 875-125 MG tablet    Sig: Take 1 tablet by mouth 2 (two) times daily.    Dispense:  20 tablet    Refill:  0    Problem List Items Addressed This Visit      Unprioritized   COPD (chronic obstructive pulmonary disease) ([place])    C/o inhalers  Call if symptoms worsen      Relevant Medications   fluticasone (FLONASE) 50  MCG/ACT nasal spray   Muscle spasm   Relevant Medications   tiZANidine (ZANAFLEX) 4 MG capsule   Primary hypertension    Well controlled, no changes to meds. Encouraged heart healthy diet such as the DASH diet and exercise as tolerated.       Relevant Orders   Lipid panel   Comprehensive metabolic panel   Sinusitis - Primary   Relevant Medications   fluticasone (FLONASE) 50 MCG/ACT nasal spray   amoxicillin-clavulanate (AUGMENTIN) 875-125 MG tablet   Sinusitis, acute    abx per orders  flonase and con't antihistamine  Call if symptoms persist      Relevant Medications   fluticasone (FLONASE) 50 MCG/ACT nasal spray   amoxicillin-clavulanate (AUGMENTIN) 875-125 MG tablet   Vitamin D deficiency    Recheck vita d      Relevant Orders   Vitamin D (25 hydroxy)      Follow-up: Return if symptoms worsen or fail to improve.  Ann Held, DO

## 2019-12-23 NOTE — Assessment & Plan Note (Signed)
Well controlled, no changes to meds. Encouraged heart healthy diet such as the DASH diet and exercise as tolerated.  °

## 2019-12-28 ENCOUNTER — Other Ambulatory Visit: Payer: Self-pay | Admitting: Family Medicine

## 2019-12-28 ENCOUNTER — Encounter: Payer: Self-pay | Admitting: Family Medicine

## 2019-12-28 NOTE — Telephone Encounter (Signed)
Yes please-- see her labs  I don't ever send them in until pt knows about it

## 2019-12-28 NOTE — Telephone Encounter (Signed)
Do you want me to send med in?

## 2019-12-29 ENCOUNTER — Other Ambulatory Visit: Payer: Self-pay

## 2019-12-29 MED ORDER — SIMVASTATIN 20 MG PO TABS: 20.0000 mg | ORAL_TABLET | Freq: Every day | ORAL | 2 refills | Status: DC

## 2020-01-14 ENCOUNTER — Encounter: Payer: Self-pay | Admitting: Emergency Medicine

## 2020-01-14 ENCOUNTER — Emergency Department
Admission: EM | Admit: 2020-01-14 | Discharge: 2020-01-14 | Disposition: A | Discharge status: Home / Self Care | Payer: Medicare HMO | Source: Home / Self Care | Attending: Family Medicine | Admitting: Family Medicine

## 2020-01-14 ENCOUNTER — Ambulatory Visit: Payer: Medicare HMO | Admitting: Nurse Practitioner

## 2020-01-14 ENCOUNTER — Other Ambulatory Visit: Payer: Self-pay

## 2020-01-14 DIAGNOSIS — W19XXXA Unspecified fall, initial encounter: Secondary | ICD-10-CM

## 2020-01-14 DIAGNOSIS — S0083XA Contusion of other part of head, initial encounter: Secondary | ICD-10-CM | POA: Diagnosis not present

## 2020-01-14 DIAGNOSIS — S0181XA Laceration without foreign body of other part of head, initial encounter: Secondary | ICD-10-CM

## 2020-01-14 DIAGNOSIS — Z23 Encounter for immunization: Secondary | ICD-10-CM

## 2020-01-14 MED ORDER — TETANUS-DIPHTH-ACELL PERTUSSIS 5-2.5-18.5 LF-MCG/0.5 IM SUSY
0.5000 mL | PREFILLED_SYRINGE | Freq: Once | INTRAMUSCULAR | Status: AC
  Administered 2020-01-14: 0.5 mL via INTRAMUSCULAR

## 2020-01-14 NOTE — Discharge Instructions (Addendum)
Try to protect tapes and leave on 3-5 days Watch for infection  Call or return for problems

## 2020-01-14 NOTE — ED Provider Notes (Signed)
[facility]    CSN: [id number] Arrival date & time: 01/14/20  1696      History   Chief Complaint Chief Complaint  Patient presents with  . Fall    HPI Jasmine Mclaughlin is a 70 y.o. female.   HPI   Lost balance and fell as she opened her refridgerator door No LOC Denies any chest pain, SOB, palpitations or visual sx States has poor balance Is on fosamax but  No bone pain Worried about a "goose egg" on forehead Has a laceration on her cheek from her glasses  Past Medical History:  Diagnosis Date  . Allergy   . Anxiety   . Arthritis    degenerative back, joint disturbance "everywhere "  . Asthma   . Chest tightness 10/27/2013  . Colon polyps   . COPD (chronic obstructive pulmonary disease) ([place])   . Generalized anxiety disorder 11/13/2012  . Genital warts   . Obesity (BMI 30-39.9) 10/14/2012  . Osteoporosis   . Pneumonia   . Shortness of breath    pt./ reports that she is out of shape  . Sinusitis, acute 10/27/2013  . Tobacco abuse 09/28/2010  . Wheezing 10/27/2013    Patient Active Problem List   Diagnosis Date Noted  . Primary hypertension 12/23/2019  . Vitamin D deficiency 12/23/2019  . Muscle spasm 12/23/2019  . Atypical chest pain 08/25/2019  . Tobacco use 08/25/2019  . Shortness of breath   . Pneumonia   . Osteoporosis   . Genital warts   . Colon polyps   . Arthritis   . Anxiety   . Tubular adenoma of colon 11/18/2017  . Allergic rhinitis 01/24/2015  . COPD (chronic obstructive pulmonary disease) ([place]) 11/22/2013  . Wheezing 10/27/2013  . Chest tightness 10/27/2013  . Generalized anxiety disorder 11/13/2012  . Degenerative disc disease, lumbar 10/14/2012  . Obesity (BMI 30-39.9) 10/14/2012  . History of colonic polyps 09/28/2010  . Osteoarthritis 09/28/2010  . Tobacco abuse 09/28/2010    Past Surgical History:  Procedure Laterality Date  . ANTERIOR FUSION LUMBAR SPINE  05/06/2012  . ANTERIOR LAT LUMBAR FUSION N/A 05/06/2012    Procedure: ANTERIOR LATERAL LUMBAR FUSION 3 LEVELS;  Surgeon: Sinclair Ship, MD;  Location: [facility];  Service: Orthopedics;  Laterality: N/A;  Lateral interbody fusion, lumbar 2-3,lumbar 3-4, lumbar 4-5 with instrumentation and allograft.  . COLONOSCOPY    . COLONOSCOPY WITH PROPOFOL N/A 12/15/2017   Procedure: COLONOSCOPY WITH PROPOFOL;  Surgeon: Rush Landmark Telford Nab., MD;  Location: [facility];  Service: Gastroenterology;  Laterality: N/A;  . COLONOSCOPY WITH PROPOFOL N/A 07/08/2018   Procedure: COLONOSCOPY WITH PROPOFOL;  Surgeon: Rush Landmark Telford Nab., MD;  Location: [facility];  Service: Gastroenterology;  Laterality: N/A;  . COLONOSCOPY WITH PROPOFOL N/A 09/01/2019   Procedure: COLONOSCOPY WITH PROPOFOL;  Surgeon: Rush Landmark Telford Nab., MD;  Location: [facility];  Service: Gastroenterology;  Laterality: N/A;  . KNEE ARTHROSCOPY Right     R knee 01/2012, for ligament tears  . POLYPECTOMY  07/08/2018   Procedure: POLYPECTOMY;  Surgeon: Mansouraty, Telford Nab., MD;  Location: [facility];  Service: Gastroenterology;;  . POLYPECTOMY  09/01/2019   Procedure: POLYPECTOMY;  Surgeon: Irving Copas., MD;  Location: [facility];  Service: Gastroenterology;;  . TONSILLECTOMY    . VAGINAL DELIVERY     x1    OB History   No obstetric history on file.      Home Medications    Prior to Admission medications   Medication  Sig Start Date End Date Taking? Authorizing Provider  acetaminophen (TYLENOL) 500 MG tablet Take 1,000 mg by mouth 2 (two) times daily.    [provider]  albuterol (VENTOLIN HFA) 108 (90 Base) MCG/ACT inhaler Inhale 2 puffs into the lungs every 6 (six) hours as needed for wheezing or shortness of breath. 09/23/19   Ann Held, DO  alendronate (FOSAMAX) 70 MG tablet TAKE 1 TABLET BY MOUTH EVERY 7 DAYS. TAKE WITH A FULL GLASS OF WATER ON AN EMPTY STOMACH. 10/08/19   Ann Held, DO  chlorpheniramine (CHLOR-TRIMETON) 4 MG tablet  Take 4 mg by mouth daily as needed for allergies.    [provider]  citalopram (CELEXA) 20 MG tablet TAKE 1 TABLET BY MOUTH EVERY DAY 09/27/19   Carollee Herter, Alferd Apa, DO  dicyclomine (BENTYL) 10 MG capsule Take 1 capsule (10 mg total) by mouth 4 (four) times daily -  before meals and at bedtime. 11/30/19   Ann Held, DO  diphenhydrAMINE (SOMINEX) 25 MG tablet Take 25 mg by mouth at bedtime.    [provider]  fluticasone (FLONASE) 50 MCG/ACT nasal spray Place 2 sprays into both nostrils daily. 12/23/19   Ann Held, DO  Fluticasone Propionate, Inhal, (FLOVENT DISKUS) 100 MCG/BLIST AEPB 1 inh bid 09/23/19   Carollee Herter, Alferd Apa, DO  nitroGLYCERIN (NITROSTAT) 0.4 MG SL tablet Place 1 tablet (0.4 mg total) under the tongue every 5 (five) minutes as needed. Patient taking differently: Place 0.4 mg under the tongue every 5 (five) minutes as needed for chest pain.  03/08/19 08/26/19  Tobb, Kardie, DO  simvastatin (ZOCOR) 20 MG tablet Take 1 tablet (20 mg total) by mouth at bedtime. 12/29/19   Ann Held, DO  tiZANidine (ZANAFLEX) 4 MG capsule Take 1 capsule (4 mg total) by mouth 3 (three) times daily as needed for muscle spasms. 12/23/19   Lowne Chase, Nadira Single R, DO  ULTRAM 50 MG tablet Take 50 mg by mouth every 6 (six) hours as needed for moderate pain.  08/23/19   [provider]  Vitamin D, Ergocalciferol, (DRISDOL) 1.25 MG (50000 UNIT) CAPS capsule Take 1 capsule (50,000 Units total) by mouth every 7 (seven) days. 09/27/19   Ann Held, DO    Family History Family History  Problem Relation Age of Onset  . Arthritis Mother   . Arthritis Father   . Stroke Father   . Colon cancer Father 86  . Heart disease Father 49       chf  . Breast cancer Maternal Grandmother   . Asthma Maternal Grandmother   . Asthma Daughter   . Esophageal cancer Neg Hx   . Ulcerative colitis Neg Hx   . Stomach cancer Neg Hx     Social  History Social History   Tobacco Use  . Smoking status: Current Every Day Smoker    Packs/day: 0.50    Years: 55.00    Pack years: 27.50    Types: Cigarettes  . Smokeless tobacco: Never Used  Vaping Use  . Vaping Use: Never used  Substance Use Topics  . Alcohol use: Yes    Alcohol/week: 0.0 standard drinks    Comment: "sporatic"- wine, beer, mixed drink  . Drug use: No     Allergies   Patient has no known allergies.   Review of Systems Review of Systems See HPPI  Physical Exam Triage Vital Signs ED Triage Vitals  Enc Vitals  Group     BP 01/14/20 0903 134/82     Pulse Rate 01/14/20 0903 74     Resp 01/14/20 0903 18     Temp 01/14/20 0903 98 F (36.7 C)     Temp Source 01/14/20 0903 Oral     SpO2 01/14/20 0903 96 %     Weight 01/14/20 0904 195 lb (88.5 kg)     Height 01/14/20 0904 5\' 5"  (1.651 m)     Head Circumference --      Peak Flow --      Pain Score 01/14/20 0904 5     Pain Loc --      Pain Edu? --      Excl. in [place]? --    No data found.  Updated Vital Signs BP 134/82 (BP Location: Right Arm)   Pulse 74   Temp 98 F (36.7 C) (Oral)   Resp 18   Ht 5\' 5"  (1.651 m)   Wt 88.5 kg   SpO2 96%   BMI 32.45 kg/m     Physical Exam Constitutional:      General: She is not in acute distress.    Appearance: She is well-developed and well-nourished.  HENT:     Head: Normocephalic and atraumatic.      Comments: Laceration as drawn, 2 cm long.  Hematoma forehead with no bony defect or tenderness    Mouth/Throat:     Mouth: Oropharynx is clear and moist.  Eyes:     Conjunctiva/sclera: Conjunctivae normal.     Pupils: Pupils are equal, round, and reactive to light.  Cardiovascular:     Rate and Rhythm: Normal rate.  Pulmonary:     Effort: Pulmonary effort is normal. No respiratory distress.  Abdominal:     General: There is no distension.     Palpations: Abdomen is soft.  Musculoskeletal:        General: No edema. Normal range of motion.      Cervical back: Normal range of motion.  Skin:    General: Skin is warm and dry.  Neurological:     General: No focal deficit present.     Mental Status: She is alert.  Psychiatric:        Behavior: Behavior normal.      UC Treatments / Results  Labs (all labs ordered are listed, but only abnormal results are displayed) Labs Reviewed - No data to display  EKG   Radiology No results found.  Procedures Procedures (including critical care time)  Medications Ordered in UC Medications  Tdap (BOOSTRIX) injection 0.5 mL (0.5 mLs Intramuscular Given 01/14/20 1010)    Initial Impression / Assessment and Plan / UC Course  I have reviewed the triage vital signs and the nursing notes.  Pertinent labs & imaging results that were available during my care of the patient were reviewed by me and considered in my medical decision making (see chart for details).    Area soaked to remove eschar Steri stripped to improve appearance Final Clinical Impressions(s) / UC Diagnoses   Final diagnoses:  Fall, initial encounter  Contusion of face, initial encounter  Facial laceration, initial encounter     Discharge Instructions     Try to protect tapes and leave on 3-5 days Watch for infection  Call or return for problems    ED Prescriptions    None     PDMP not reviewed this encounter.   Raylene Everts, MD 01/14/20 1036

## 2020-01-14 NOTE — ED Triage Notes (Signed)
Fall last night about 8pm hit head and abrasion on face.

## 2020-01-17 ENCOUNTER — Ambulatory Visit: Payer: Medicare HMO | Admitting: Neurology

## 2020-02-22 ENCOUNTER — Encounter: Payer: Self-pay | Admitting: Family Medicine

## 2020-02-22 DIAGNOSIS — M62838 Other muscle spasm: Secondary | ICD-10-CM

## 2020-02-22 MED ORDER — TIZANIDINE HCL 4 MG PO CAPS: 4.0000 mg | ORAL_CAPSULE | Freq: Three times a day (TID) | ORAL | 1 refills | Status: DC | PRN

## 2020-02-25 ENCOUNTER — Other Ambulatory Visit: Payer: Self-pay

## 2020-02-25 ENCOUNTER — Ambulatory Visit: Payer: Medicare HMO | Admitting: Cardiology

## 2020-02-25 ENCOUNTER — Encounter: Payer: Self-pay | Admitting: Cardiology

## 2020-02-25 VITALS — BP 150/82 | HR 63 | Ht 65.0 in | Wt 208.0 lb

## 2020-02-25 DIAGNOSIS — F17200 Nicotine dependence, unspecified, uncomplicated: Secondary | ICD-10-CM | POA: Diagnosis not present

## 2020-02-25 DIAGNOSIS — E782 Mixed hyperlipidemia: Secondary | ICD-10-CM | POA: Diagnosis not present

## 2020-02-25 DIAGNOSIS — R0789 Other chest pain: Secondary | ICD-10-CM | POA: Diagnosis not present

## 2020-02-25 DIAGNOSIS — R69 Illness, unspecified: Secondary | ICD-10-CM | POA: Diagnosis not present

## 2020-02-25 DIAGNOSIS — I10 Essential (primary) hypertension: Secondary | ICD-10-CM

## 2020-02-25 DIAGNOSIS — E669 Obesity, unspecified: Secondary | ICD-10-CM | POA: Diagnosis not present

## 2020-02-25 MED ORDER — METOPROLOL TARTRATE 100 MG PO TABS: ORAL_TABLET | ORAL | 0 refills | Status: DC

## 2020-02-25 MED ORDER — HYDROCHLOROTHIAZIDE 12.5 MG PO CAPS: 12.5000 mg | ORAL_CAPSULE | Freq: Every day | ORAL | 3 refills | Status: DC

## 2020-02-25 NOTE — Progress Notes (Signed)
Cardiology Office Note:    Date:  02/25/2020   ID:  Jasmine Mclaughlin, DOB 04-20-1950, MRN [id number]  PCP:  Carollee Herter, Alferd Apa, DO  Cardiologist:  Berniece Salines, DO  Electrophysiologist:  None   Referring MD: Carollee Herter, Alferd Apa, *   Chief Complaint  Patient presents with  . Follow-up    History of Present Illness:    Jasmine Mclaughlin is a 70 y.o. female with a hx of COPD, current smoker, anxiety is here today for follow-up visit.  At her initial visit I saw the patient for chest pain [place] nuclear stress test which was reported to be normal and low risk, we also did an echocardiogram which was normal.  I saw the patient subsequently with the last visit on August 25, 2019 at that time she had been experiencing some intermittent chest pain she had not used any nitroglycerin.  We discussed the chest pain and giving that she had a recent stress test was worse normal we decided to continue to monitor this pain.   The patient tells me that she is experiencing more frequent episodes of his chest pain.  She described it as a midsternal tightness and sometimes pressure-like sensation.  Last for few minutes and then resolve on its own.  She had not had any nitroglycerin.  She is concerned.  Past Medical History:  Diagnosis Date  . Allergy   . Anxiety   . Arthritis    degenerative back, joint disturbance "everywhere "  . Asthma   . Chest tightness 10/27/2013  . Colon polyps   . COPD (chronic obstructive pulmonary disease) ([place])   . Generalized anxiety disorder 11/13/2012  . Genital warts   . Obesity (BMI 30-39.9) 10/14/2012  . Osteoporosis   . Pneumonia   . Shortness of breath    pt./ reports that she is out of shape  . Sinusitis, acute 10/27/2013  . Tobacco abuse 09/28/2010  . Wheezing 10/27/2013    Past Surgical History:  Procedure Laterality Date  . ANTERIOR FUSION LUMBAR SPINE  05/06/2012  . ANTERIOR LAT LUMBAR FUSION N/A 05/06/2012   Procedure: ANTERIOR LATERAL LUMBAR FUSION 3  LEVELS;  Surgeon: Sinclair Ship, MD;  Location: [facility];  Service: Orthopedics;  Laterality: N/A;  Lateral interbody fusion, lumbar 2-3,lumbar 3-4, lumbar 4-5 with instrumentation and allograft.  . COLONOSCOPY    . COLONOSCOPY WITH PROPOFOL N/A 12/15/2017   Procedure: COLONOSCOPY WITH PROPOFOL;  Surgeon: Rush Landmark Telford Nab., MD;  Location: [place];  Service: Gastroenterology;  Laterality: N/A;  . COLONOSCOPY WITH PROPOFOL N/A 07/08/2018   Procedure: COLONOSCOPY WITH PROPOFOL;  Surgeon: Rush Landmark Telford Nab., MD;  Location: [place];  Service: Gastroenterology;  Laterality: N/A;  . COLONOSCOPY WITH PROPOFOL N/A 09/01/2019   Procedure: COLONOSCOPY WITH PROPOFOL;  Surgeon: Rush Landmark Telford Nab., MD;  Location: [facility];  Service: Gastroenterology;  Laterality: N/A;  . KNEE ARTHROSCOPY Right     R knee 01/2012, for ligament tears  . POLYPECTOMY  07/08/2018   Procedure: POLYPECTOMY;  Surgeon: Mansouraty, Telford Nab., MD;  Location: [place];  Service: Gastroenterology;;  . POLYPECTOMY  09/01/2019   Procedure: POLYPECTOMY;  Surgeon: Irving Copas., MD;  Location: [facility];  Service: Gastroenterology;;  . TONSILLECTOMY    . VAGINAL DELIVERY     x1    Current Medications: Current Meds  Medication Sig  . acetaminophen (TYLENOL) 500 MG tablet Take 1,000 mg by mouth 2 (two) times daily.  Marland Kitchen albuterol (VENTOLIN HFA) 108 (90 Base)  MCG/ACT inhaler Inhale 2 puffs into the lungs every 6 (six) hours as needed for wheezing or shortness of breath.  Marland Kitchen alendronate (FOSAMAX) 70 MG tablet TAKE 1 TABLET BY MOUTH EVERY 7 DAYS. TAKE WITH A FULL GLASS OF WATER ON AN EMPTY STOMACH.  . chlorpheniramine (CHLOR-TRIMETON) 4 MG tablet Take 4 mg by mouth daily as needed for allergies.  . citalopram (CELEXA) 20 MG tablet TAKE 1 TABLET BY MOUTH EVERY DAY  . dicyclomine (BENTYL) 10 MG capsule Take 1 capsule (10 mg total) by mouth 4 (four) times daily -  before meals and at bedtime.  .  diphenhydrAMINE (SOMINEX) 25 MG tablet Take 25 mg by mouth at bedtime.  . fluticasone (FLONASE) 50 MCG/ACT nasal spray Place 2 sprays into both nostrils daily.  . hydrochlorothiazide (MICROZIDE) 12.5 MG capsule Take 1 capsule (12.5 mg total) by mouth daily.  . metoprolol tartrate (LOPRESSOR) 100 MG tablet Take 2 hours prior to CT  . nitroGLYCERIN (NITROSTAT) 0.4 MG SL tablet Place 1 tablet (0.4 mg total) under the tongue every 5 (five) minutes as needed.  . simvastatin (ZOCOR) 20 MG tablet Take 1 tablet (20 mg total) by mouth at bedtime.  Marland Kitchen tiZANidine (ZANAFLEX) 4 MG capsule Take 1 capsule (4 mg total) by mouth 3 (three) times daily as needed for muscle spasms.  Marland Kitchen ULTRAM 50 MG tablet Take 50 mg by mouth every 6 (six) hours as needed for moderate pain.   . Vitamin D, Ergocalciferol, (DRISDOL) 1.25 MG (50000 UNIT) CAPS capsule Take 1 capsule (50,000 Units total) by mouth every 7 (seven) days.     Allergies:   Patient has no known allergies.   Social History   Socioeconomic History  . Marital status: Single    Spouse name: Not on file  . Number of children: 1  . Years of education: Not on file  . Highest education level: Not on file  Occupational History  . 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 Smoking status: Current Every Day Smoker    Packs/day: 0.50    Years: 55.00    Pack years: 27.50    Types: Cigarettes  . Smokeless tobacco: Never Used  Vaping Use  . Vaping Use: Never used  Substance and Sexual Activity  . Alcohol use: Yes    Alcohol/week: 0.0 standard drinks    Comment: "sporatic"- wine, beer, mixed drink  . Drug use: No  . Sexual activity: Not Currently    Partners: Male  Other Topics Concern  . Not on file  Social History Narrative   1 caffeine drinks daily   No exercise   Social Determinants of Health   Financial Resource Strain: Low Risk   . Difficulty of Paying Living Expenses: Not hard at all  Food Insecurity: No  Food Insecurity  . Worried About Charity fundraiser in the Last Year: Never true  . Ran Out of Food in the Last Year: Never true  Transportation Needs: No Transportation Needs  . Lack of Transportation (Medical): No  . Lack of Transportation (Non-Medical): No  Physical Activity: Not on file  Stress: Not on file  Social Connections: Not on file     Family History: The patient's family history includes Arthritis in her father and mother; Asthma in her daughter and maternal grandmother; Breast cancer in her maternal grandmother; Colon cancer (age of onset: 78) in her father; Heart disease (age of onset: 24) in her father; Stroke  in her father. There is no history of Esophageal cancer, Ulcerative colitis, or Stomach cancer.  ROS:   Review of Systems  Constitution: Negative for decreased appetite, fever and weight gain.  HENT: Negative for congestion, ear discharge, hoarse voice and sore throat.   Eyes: Negative for discharge, redness, vision loss in right eye and visual halos.  Cardiovascular: Negative for chest pain, dyspnea on exertion, leg swelling, orthopnea and palpitations.  Respiratory: Negative for cough, hemoptysis, shortness of breath and snoring.   Endocrine: Negative for heat intolerance and polyphagia.  Hematologic/Lymphatic: Negative for bleeding problem. Does not bruise/bleed easily.  Skin: Negative for flushing, nail changes, rash and suspicious lesions.  Musculoskeletal: Negative for arthritis, joint pain, muscle cramps, myalgias, neck pain and stiffness.  Gastrointestinal: Negative for abdominal pain, bowel incontinence, diarrhea and excessive appetite.  Genitourinary: Negative for decreased libido, genital sores and incomplete emptying.  Neurological: Negative for brief paralysis, focal weakness, headaches and loss of balance.  Psychiatric/Behavioral: Negative for altered mental status, depression and suicidal ideas.  Allergic/Immunologic: Negative for HIV exposure and  persistent infections.    EKGs/Labs/Other Studies Reviewed:    The following studies were reviewed today:   EKG:  The ekg ordered today demonstrates sinus rhythm, heart rate 63 bpm, left atrial enlargement.  Nuclear stress test Impression: 2021 1. Normal myocardial perfusion imaging study without evidence of ischemia or infarction.  2. Normal LVEF, 62%. 3. Brief ectopic atrial rhythm (6 beat duration) captured on baseline ECGs. 4. This is a low-risk study.    Echo IMPRESSIONS March 2021 1. Left ventricular ejection fraction, by estimation, is 60 to 65%. The  left ventricle has normal function. The left ventricle has no regional  wall motion abnormalities. There is mild concentric left ventricular  hypertrophy. Left ventricular diastolic  parameters are consistent with Grade II diastolic dysfunction  (pseudonormalization). The average left ventricular global longitudinal  strain is -16.6 %.  2. Right ventricular systolic function is normal. The right ventricular  size is normal. There is normal pulmonary artery systolic pressure.  3. The mitral valve is normal in structure. Trivial mitral valve  regurgitation. No evidence of mitral stenosis.  4. The aortic valve is tricuspid. Aortic valve regurgitation is not  visualized. No aortic stenosis is present.  5. The inferior vena cava is normal in size with greater than 50%  respiratory variability, suggesting right atrial pressure of 3 mmHg.   FINDINGS  Left Ventricle: Left ventricular ejection fraction, by estimation, is 60  to 65%. The left ventricle has normal function. The left ventricle has no  regional wall motion abnormalities. The average left ventricular global  longitudinal strain is -16.6 %.  The left ventricular internal cavity size was normal in size. There is  mild concentric left ventricular hypertrophy. Left ventricular diastolic  parameters are consistent with Grade II diastolic dysfunction   (pseudonormalization).   Right Ventricle: The right ventricular size is normal. No increase in  right ventricular wall thickness. Right ventricular systolic function is  normal. There is normal pulmonary artery systolic pressure. The tricuspid  regurgitant velocity is 1.30 m/s, and  with an assumed right atrial pressure of 3 mmHg, the estimated right  ventricular systolic pressure is 9.8 mmHg.   Left Atrium: Left atrial size was normal in size.   Right Atrium: Right atrial size was normal in size.   Pericardium: Trivial pericardial effusion is present. The pericardial  effusion is posterior to the left ventricle.   Mitral Valve: The mitral valve is normal in  structure. Normal mobility of  the mitral valve leaflets. Trivial mitral valve regurgitation. No evidence  of mitral valve stenosis.   Tricuspid Valve: The tricuspid valve is normal in structure. Tricuspid  valve regurgitation is not demonstrated. No evidence of tricuspid  stenosis.   Aortic Valve: The aortic valve is tricuspid. Aortic valve regurgitation is  not visualized. No aortic stenosis is present.   Pulmonic Valve: The pulmonic valve was normal in structure. Pulmonic valve  regurgitation is not visualized. No evidence of pulmonic stenosis.   Aorta: The aortic root and ascending aorta are structurally normal, with  no evidence of dilitation.   Venous: A normal flow pattern is recorded from the right upper pulmonary  vein. The inferior vena cava is normal in size with greater than 50%  respiratory variability, suggesting right atrial pressure of 3 mmHg.   IAS/Shunts: No atrial level shunt detected by color flow Doppler.    Recent Labs: 09/23/2019: TSH 0.95 12/01/2019: Hemoglobin 13.8; Platelets 335.0 12/23/2019: ALT 11; BUN 17; Creatinine, Ser 0.78; Potassium 4.6; Sodium 137  Recent Lipid Panel    Component Value Date/Time   CHOL 219 (H) 12/23/2019 1126   TRIG 209.0 (H) 12/23/2019 1126   HDL 53.90  12/23/2019 1126   CHOLHDL 4 12/23/2019 1126   VLDL 41.8 (H) 12/23/2019 1126   LDLCALC 117 (H) 09/23/2019 0952   LDLDIRECT 135.0 12/23/2019 1126    Physical Exam:    VS:  BP (!) 150/82 (BP Location: Right Arm, Patient Position: Sitting, Cuff Size: Normal)   Pulse 63   Ht 5\' 5"  (1.651 m)   Wt 208 lb (94.3 kg)   SpO2 95%   BMI 34.61 kg/m     Wt Readings from Last 3 Encounters:  02/25/20 208 lb (94.3 kg)  01/14/20 195 lb (88.5 kg)  12/23/19 202 lb 3.2 oz (91.7 kg)     GEN: Well nourished, well developed in no acute distress HEENT: Normal NECK: No JVD; No carotid bruits LYMPHATICS: No lymphadenopathy CARDIAC: S1S2 noted,RRR, no murmurs, rubs, gallops RESPIRATORY:  Clear to auscultation without rales, wheezing or rhonchi  ABDOMEN: Soft, non-tender, non-distended, +bowel sounds, no guarding. EXTREMITIES: No edema, No cyanosis, no clubbing MUSCULOSKELETAL:  No deformity  SKIN: Warm and dry NEUROLOGIC:  Alert and oriented x 3, non-focal PSYCHIATRIC:  Normal affect, good insight  ASSESSMENT:    1. Chest discomfort   2. Other chest pain   3. Hypertension, unspecified type   4. Mixed hyperlipidemia   5. Current smoker   6. Obesity (BMI 30-39.9)    PLAN:     She still is experiencing intermittent chest pain syndrome despite her low risk "stress test, this chest pain is concerning patient does have intermediate risk for coronary artery disease would not like to do is pursue an ischemic evaluation in this patient.  Shared decision a coronary CTA at this time is appropriate.  I have discussed with the patient about the testing.  The patient has no IV contrast allergy and is agreeable to proceed with this test.  But this definitive test will be able to understand if coronary artery disease is playing a role here.  I have encouraged the patient to also take her nitroglycerin if she has chest pain.  Was also hypertensive today and she tells me that she has been monitoring her blood  pressure and this has been averaging this way so I like to do is started patient on hydrochlorothiazide 12.5 mg daily.  She was started on Zocor 20  mg by her PCP based on her recent lipid profile and I am in agreement with this will continue this medication.  The patient understands the need to lose weight with diet and exercise. We have discussed specific strategies for this.  The patient is in agreement with the above plan. The patient left the office in stable condition.  The patient will follow up in 3 months due to medication change.   Medication Adjustments/Labs and Tests Ordered: Current medicines are reviewed at length with the patient today.  Concerns regarding medicines are outlined above.  Orders Placed This Encounter  Procedures  . CT CORONARY MORPH W/CTA COR W/SCORE W/CA W/CM &/OR WO/CM  . CT CORONARY FRACTIONAL FLOW RESERVE DATA PREP  . CT CORONARY FRACTIONAL FLOW RESERVE FLUID ANALYSIS  . Basic metabolic panel  . Magnesium  . EKG 12-Lead   Meds ordered this encounter  Medications  . hydrochlorothiazide (MICROZIDE) 12.5 MG capsule    Sig: Take 1 capsule (12.5 mg total) by mouth daily.    Dispense:  90 capsule    Refill:  3  . metoprolol tartrate (LOPRESSOR) 100 MG tablet    Sig: Take 2 hours prior to CT    Dispense:  1 tablet    Refill:  0    Patient Instructions  Medication Instructions:  Your physician has recommended you make the following change in your medication: START: Hydrochlorothiazide 12.5 mg daily  *If you need a refill on your cardiac medications before your next appointment, please call your pharmacy*   Lab Work: Your physician recommends that you return for lab work:  7-10 days to CT: BMET, Mag  If you have labs (blood work) drawn today and your tests are completely normal, you will receive your results only by: Marland Kitchen MyChart Message (if you have MyChart) OR . A paper copy in the mail If you have any lab test that is abnormal or we need to  change your treatment, we will call you to review the results.   Testing/Procedures: Your cardiac CT will be scheduled at:  [facility] [address] [place], [place] [address] [phone]   If scheduled at [facility], please arrive at the [facility] main entrance (entrance A) of [facility] 30 minutes prior to test start time. Proceed to the [facility] Radiology Department (first floor) to check-in and test prep.  Please follow these instructions carefully (unless otherwise directed):   On the Night Before the Test: . Be sure to Drink plenty of water. . Do not consume any caffeinated/decaffeinated beverages or chocolate 12 hours prior to your test. . Do not take any antihistamines 12 hours prior to your test.  On the Day of the Test: . Drink plenty of water until 1 hour prior to the test. . Do not eat any food 4 hours prior to the test. . You may take your regular medications prior to the test.  . HOLD Hydrochlorothiazide day of procedure  . Take metoprolol (Lopressor) two hours prior to test. . FEMALES- please wear underwire-free bra if available      After the Test: . Drink plenty of water. . After receiving IV contrast, you may experience a mild flushed feeling. This is normal. . On occasion, you may experience a mild rash up to 24 hours after the test. This is not dangerous. If this occurs, you can take Benadryl 25 mg and increase your fluid intake. . If you experience trouble breathing, this can be serious.  If it is severe call 911 IMMEDIATELY. If it is mild, please call our office. . If you take any of these medications: Glipizide/Metformin, Avandament, Glucavance, please do not take 48 hours after completing test unless otherwise instructed.   Once we have confirmed authorization from your insurance company, we will call you to set up a date and time for your test. Based on how quickly your insurance processes prior authorizations  requests, please allow up to 4 weeks to be contacted for scheduling your Cardiac CT appointment. Be advised that routine Cardiac CT appointments could be scheduled as many as 8 weeks after your provider has ordered it.  For non-scheduling related questions, please contact the cardiac imaging nurse navigator should you have any questions/concerns: Marchia Bond, Cardiac Imaging Nurse Navigator Gordy Clement, Cardiac Imaging Nurse Navigator [facility] Direct Office Dial: [phone]   For scheduling needs, including cancellations and rescheduling, please call [place], [phone].     Follow-Up: At [facility], you and your health needs are our priority.  As part of our continuing mission to provide you with exceptional heart care, we have created designated Provider Care Teams.  These Care Teams include your primary Cardiologist (physician) and Advanced Practice Providers (APPs -  Physician Assistants and Nurse Practitioners) who all work together to provide you with the care you need, when you need it.  We recommend signing up for the patient portal called "MyChart".  Sign up information is provided on this After Visit Summary.  MyChart is used to connect with patients for Virtual Visits (Telemedicine).  Patients are able to view lab/test results, encounter notes, upcoming appointments, etc.  Non-urgent messages can be sent to your provider as well.   To learn more about what you can do with MyChart, go to [email or web address].    Your next appointment:   3 month(s)  The format for your next appointment:   In Person  Provider:   Berniece Salines, DO   Other Instructions      Adopting a Healthy Lifestyle.  Know what a healthy weight is for you (roughly BMI <25) and aim to maintain this   Aim for 7+ servings of fruits and vegetables daily   65-80+ fluid ounces of water or unsweet tea for healthy kidneys   Limit to max 1 drink of alcohol per day;  avoid smoking/tobacco   Limit animal fats in diet for cholesterol and heart health - choose grass fed whenever available   Avoid highly processed foods, and foods high in saturated/trans fats   Aim for low stress - take time to unwind and care for your mental health   Aim for 150 min of moderate intensity exercise weekly for heart health, and weights twice weekly for bone health   Aim for 7-9 hours of sleep daily   When it comes to diets, agreement about the perfect plan isnt easy to find, even among the experts. Experts at the [place] developed an idea known as the Healthy Eating Plate. Just imagine a plate divided into logical, healthy portions.   The emphasis is on diet quality:   Load up on vegetables and fruits - one-half of your plate: Aim for color and variety, and remember that potatoes dont count.   Go for whole grains - one-quarter of your plate: Whole wheat, barley, wheat berries, quinoa, oats, brown rice, and foods made with them. If you want pasta, go with whole wheat pasta.   Protein power - one-quarter  of your plate: Fish, chicken, beans, and nuts are all healthy, versatile protein sources. Limit red meat.   The diet, however, does go beyond the plate, offering a few other suggestions.   Use healthy plant oils, such as olive, canola, soy, corn, sunflower and peanut. Check the labels, and avoid partially hydrogenated oil, which have unhealthy trans fats.   If youre thirsty, drink water. Coffee and tea are good in moderation, but skip sugary drinks and limit milk and dairy products to one or two daily servings.   The type of carbohydrate in the diet is more important than the amount. Some sources of carbohydrates, such as vegetables, fruits, whole grains, and beans-are healthier than others.   Finally, stay active  Signed, Berniece Salines, DO  02/25/2020 1:32 PM    [facility]

## 2020-02-25 NOTE — Patient Instructions (Addendum)
Medication Instructions:  Your physician has recommended you make the following change in your medication: START: Hydrochlorothiazide 12.5 mg daily  *If you need a refill on your cardiac medications before your next appointment, please call your pharmacy*   Lab Work: Your physician recommends that you return for lab work:  7-10 days to CT: BMET, Mag  If you have labs (blood work) drawn today and your tests are completely normal, you will receive your results only by: Marland Kitchen MyChart Message (if you have MyChart) OR . A paper copy in the mail If you have any lab test that is abnormal or we need to change your treatment, we will call you to review the results.   Testing/Procedures: Your cardiac CT will be scheduled at:  [facility] [address] [place], [place] [address] [phone]   If scheduled at [facility], please arrive at the [facility] main entrance (entrance A) of [facility] 30 minutes prior to test start time. Proceed to the [facility] Radiology Department (first floor) to check-in and test prep.  Please follow these instructions carefully (unless otherwise directed):   On the Night Before the Test: . Be sure to Drink plenty of water. . Do not consume any caffeinated/decaffeinated beverages or chocolate 12 hours prior to your test. . Do not take any antihistamines 12 hours prior to your test.  On the Day of the Test: . Drink plenty of water until 1 hour prior to the test. . Do not eat any food 4 hours prior to the test. . You may take your regular medications prior to the test.  . HOLD Hydrochlorothiazide day of procedure  . Take metoprolol (Lopressor) two hours prior to test. . FEMALES- please wear underwire-free bra if available      After the Test: . Drink plenty of water. . After receiving IV contrast, you may experience a mild flushed feeling. This is normal. . On occasion, you may experience a mild rash up to 24 hours after  the test. This is not dangerous. If this occurs, you can take Benadryl 25 mg and increase your fluid intake. . If you experience trouble breathing, this can be serious. If it is severe call 911 IMMEDIATELY. If it is mild, please call our office. . If you take any of these medications: Glipizide/Metformin, Avandament, Glucavance, please do not take 48 hours after completing test unless otherwise instructed.   Once we have confirmed authorization from your insurance company, we will call you to set up a date and time for your test. Based on how quickly your insurance processes prior authorizations requests, please allow up to 4 weeks to be contacted for scheduling your Cardiac CT appointment. Be advised that routine Cardiac CT appointments could be scheduled as many as 8 weeks after your provider has ordered it.  For non-scheduling related questions, please contact the cardiac imaging nurse navigator should you have any questions/concerns: Marchia Bond, Cardiac Imaging Nurse Navigator Gordy Clement, Cardiac Imaging Nurse Navigator [facility] Direct Office Dial: [phone]   For scheduling needs, including cancellations and rescheduling, please call [place], [phone].     Follow-Up: At [facility], you and your health needs are our priority.  As part of our continuing mission to provide you with exceptional heart care, we have created designated Provider Care Teams.  These Care Teams include your primary Cardiologist (physician) and Advanced Practice Providers (APPs -  Physician Assistants and Nurse Practitioners) who all work together  to provide you with the care you need, when you need it.  We recommend signing up for the patient portal called "MyChart".  Sign up information is provided on this After Visit Summary.  MyChart is used to connect with patients for Virtual Visits (Telemedicine).  Patients are able to view lab/test results, encounter notes,  upcoming appointments, etc.  Non-urgent messages can be sent to your provider as well.   To learn more about what you can do with MyChart, go to [email or web address].    Your next appointment:   3 month(s)  The format for your next appointment:   In Person  Provider:   Berniece Salines, DO   Other Instructions

## 2020-03-07 ENCOUNTER — Other Ambulatory Visit: Payer: Self-pay

## 2020-03-07 ENCOUNTER — Other Ambulatory Visit (INDEPENDENT_AMBULATORY_CARE_PROVIDER_SITE_OTHER): Payer: Medicare HMO

## 2020-03-07 DIAGNOSIS — I10 Essential (primary) hypertension: Secondary | ICD-10-CM

## 2020-03-07 LAB — LIPID PANEL
Cholesterol: 160 mg/dL (ref 0–200)
HDL: 52.4 mg/dL (ref >39.00)
LDL Cholesterol: 77 mg/dL (ref 0–99)
NonHDL: 107.58
Total CHOL/HDL Ratio: 3
Triglycerides: 153 mg/dL — ABNORMAL HIGH (ref 0.0–149.0)
VLDL: 30.6 mg/dL (ref 0.0–40.0)

## 2020-03-08 ENCOUNTER — Encounter: Payer: Self-pay | Admitting: Family Medicine

## 2020-03-12 NOTE — Progress Notes (Signed)
NEUROLOGY CONSULTATION NOTE  Jasmine Mclaughlin MRN: [id number] DOB: 1950-09-28  Referring provider: Roma Schanz, DO Primary care provider: Roma Schanz, DO  Reason for consult:  Frequent falls  Assessment/Plan:   1.  Unsteady gait with frequent falls 2.  Lumbar degenerative disc disease 3.  Chronic low back pain with bilateral radiculopathy 4.  Peripheral neuropathy  Her falls are likely related to neuropathy in the feet, either secondary to lumbar radiculopathy or polyneuropathy or both.  This is also contributing to her lower extremity pain.  She also demonstrates Trandelenburg's sign suggesting possible hip dysfunction.  She does have history of arthritis and endorses hip pain.   1.  She has already failed conservative therapy (physical therapy), therefore we will check MRI of lumbar spine to see if there is any structural abnormalities that may respond to other modalities of therapy.  Once done, she is encouraged to have her orthopedist look at the images as well. 2.  NCV-EMG of lower extremities to evaluate for underlying polyneuropathy in addition to radiculopathy. 3.  For pain, will start gabapentin 100mg , titrating to 300mg  at bedtime.  We can continue titration as needed/tolerated.  4.  Further recommendations pending results. Follow up 4 to 6 months.  Subjective:  Jasmine Burkitt. Mclaughlin is a 70 year old left-handed female with asthma, HTN, arthritis, osteoporosis, anxiety who presents for frequent falls.  History supplemented by ED and referring provider's notes.  Patient underwent lumbar fusion several years ago.  She had some tingling in the feet afterwards that resolved but then returned a couple of years later.  For years her toes tingled.  Over the past couple of years, she reports shooting pain in her toes of both feet.  When she is sitting, she finds herself constantly wiggling her toes.  In bed, she feels aching in her legs below the knees.  She tries moving her legs in  bed which is ineffective and she has to get up and walk around at night.  She sometimes has shooting pain down the side of both legs.  She continues to have chronic back pain.  She returned to her orthopedist who performed a X-ray and was told she had degenerative disc disease of the lumbar spine.  An MRI of the lumbar spine was ordered but was denied by her insurance.  She also has osteoarthritis and hip pain.  She has history of right knee surgery.  She reports decreased feeling in her feet and doesn't feel stable when standing.  She has had multiple falls over the past year.  Sometimes she feels slight vertigo for a few seconds when she first gets up but dizziness is not the cause of her falls.  She just may fall over, especially if she has to bend over to pick something off of the floor.  She did go to physical therapy which didn't provide any lasting improvement in pain or balance.  She most recently fell in early January in which she was evaluated in the ED.  She does not have diabetes.  B12 and TSH from September were 299 and 0.95 respectively.  CMP from December was normal.     PAST MEDICAL HISTORY: Past Medical History:  Diagnosis Date  . Allergy   . Anxiety   . Arthritis    degenerative back, joint disturbance "everywhere "  . Asthma   . Chest tightness 10/27/2013  . Colon polyps   . COPD (chronic obstructive pulmonary disease) ([place])   . Generalized  anxiety disorder 11/13/2012  . Genital warts   . Obesity (BMI 30-39.9) 10/14/2012  . Osteoporosis   . Pneumonia   . Shortness of breath    pt./ reports that she is out of shape  . Sinusitis, acute 10/27/2013  . Tobacco abuse 09/28/2010  . Wheezing 10/27/2013    PAST SURGICAL HISTORY: Past Surgical History:  Procedure Laterality Date  . ANTERIOR FUSION LUMBAR SPINE  05/06/2012  . ANTERIOR LAT LUMBAR FUSION N/A 05/06/2012   Procedure: ANTERIOR LATERAL LUMBAR FUSION 3 LEVELS;  Surgeon: Sinclair Ship, MD;  Location: [place];   Service: Orthopedics;  Laterality: N/A;  Lateral interbody fusion, lumbar 2-3,lumbar 3-4, lumbar 4-5 with instrumentation and allograft.  . COLONOSCOPY    . COLONOSCOPY WITH PROPOFOL N/A 12/15/2017   Procedure: COLONOSCOPY WITH PROPOFOL;  Surgeon: Rush Landmark Telford Nab., MD;  Location: [place];  Service: Gastroenterology;  Laterality: N/A;  . COLONOSCOPY WITH PROPOFOL N/A 07/08/2018   Procedure: COLONOSCOPY WITH PROPOFOL;  Surgeon: Rush Landmark Telford Nab., MD;  Location: [place];  Service: Gastroenterology;  Laterality: N/A;  . COLONOSCOPY WITH PROPOFOL N/A 09/01/2019   Procedure: COLONOSCOPY WITH PROPOFOL;  Surgeon: Rush Landmark Telford Nab., MD;  Location: [facility];  Service: Gastroenterology;  Laterality: N/A;  . KNEE ARTHROSCOPY Right     R knee 01/2012, for ligament tears  . POLYPECTOMY  07/08/2018   Procedure: POLYPECTOMY;  Surgeon: Mansouraty, Telford Nab., MD;  Location: [place];  Service: Gastroenterology;;  . POLYPECTOMY  09/01/2019   Procedure: POLYPECTOMY;  Surgeon: Irving Copas., MD;  Location: [facility];  Service: Gastroenterology;;  . TONSILLECTOMY    . VAGINAL DELIVERY     x1    MEDICATIONS: Current Outpatient Medications on File Prior to Visit  Medication Sig Dispense Refill  . acetaminophen (TYLENOL) 500 MG tablet Take 1,000 mg by mouth 2 (two) times daily.    Marland Kitchen albuterol (VENTOLIN HFA) 108 (90 Base) MCG/ACT inhaler Inhale 2 puffs into the lungs every 6 (six) hours as needed for wheezing or shortness of breath. 1 each 5  . alendronate (FOSAMAX) 70 MG tablet TAKE 1 TABLET BY MOUTH EVERY 7 DAYS. TAKE WITH A FULL GLASS OF WATER ON AN EMPTY STOMACH. 4 tablet 2  . chlorpheniramine (CHLOR-TRIMETON) 4 MG tablet Take 4 mg by mouth daily as needed for allergies.    . citalopram (CELEXA) 20 MG tablet TAKE 1 TABLET BY MOUTH EVERY DAY 90 tablet 1  . dicyclomine (BENTYL) 10 MG capsule Take 1 capsule (10 mg total) by mouth 4 (four) times daily -  before meals and  at bedtime. 20 capsule 1  . diphenhydrAMINE (SOMINEX) 25 MG tablet Take 25 mg by mouth at bedtime.    . fluticasone (FLONASE) 50 MCG/ACT nasal spray Place 2 sprays into both nostrils daily. 16 g 6  . hydrochlorothiazide (MICROZIDE) 12.5 MG capsule Take 1 capsule (12.5 mg total) by mouth daily. 90 capsule 3  . metoprolol tartrate (LOPRESSOR) 100 MG tablet Take 2 hours prior to CT 1 tablet 0  . nitroGLYCERIN (NITROSTAT) 0.4 MG SL tablet Place 1 tablet (0.4 mg total) under the tongue every 5 (five) minutes as needed. 30 tablet 3  . simvastatin (ZOCOR) 20 MG tablet Take 1 tablet (20 mg total) by mouth at bedtime. 30 tablet 2  . tiZANidine (ZANAFLEX) 4 MG capsule Take 1 capsule (4 mg total) by mouth 3 (three) times daily as needed for muscle spasms. 30 capsule 1  . ULTRAM 50 MG tablet Take 50 mg by mouth  every 6 (six) hours as needed for moderate pain.     . Vitamin D, Ergocalciferol, (DRISDOL) 1.25 MG (50000 UNIT) CAPS capsule Take 1 capsule (50,000 Units total) by mouth every 7 (seven) days. 12 capsule 1   No current facility-administered medications on file prior to visit.    ALLERGIES: No Known Allergies  FAMILY HISTORY: Family History  Problem Relation Age of Onset  . Arthritis Mother   . Arthritis Father   . Stroke Father   . Colon cancer Father 71  . Heart disease Father 64       chf  . Breast cancer Maternal Grandmother   . Asthma Maternal Grandmother   . Asthma Daughter   . Esophageal cancer Neg Hx   . Ulcerative colitis Neg Hx   . Stomach cancer Neg Hx     Objective:  Blood pressure (!) 159/90, pulse 74, height 5\' 2"  (1.575 m), weight 209 lb (94.8 kg), SpO2 98 %. General: No acute distress.  Patient appears well-groomed.   Head:  Normocephalic/atraumatic Eyes:  fundi examined but not visualized Neck: supple, no paraspinal tenderness, full range of motion Back: No paraspinal tenderness Heart: regular rate and rhythm Lungs: Clear to auscultation bilaterally. Vascular: No  carotid bruits. Neurological Exam: Mental status: alert and oriented to person, place, and time, recent and remote memory intact, fund of knowledge intact, attention and concentration intact, speech fluent and not dysarthric, language intact. Cranial nerves: CN I: not tested CN II: pupils equal, round and reactive to light, visual fields intact CN III, IV, VI:  full range of motion, no nystagmus, no ptosis CN V: facial sensation intact. CN VII: upper and lower face symmetric CN VIII: hearing intact CN IX, X: gag intact, uvula midline CN XI: sternocleidomastoid and trapezius muscles intact CN XII: tongue midline Bulk & Tone: normal, no fasciculations. Motor:  muscle strength 5/5 throughout Sensation:  Pinprick and vibratory sensation reduced in toes up to mid-shins. Deep Tendon Reflexes:  2+ throughout except absent in right patellar and both ankles,  toes downgoing.   Finger to nose testing:  Without dysmetria.   Heel to shin:  Without dysmetria.   Gait:  Wide-based gait, lateral trunk bend to right, able to turn, Romberg negative.    Thank you for allowing me to take part in the care of this patient.  Metta Clines, DO  CC:  Roma Schanz, DO

## 2020-03-13 ENCOUNTER — Encounter: Payer: Self-pay | Admitting: Neurology

## 2020-03-13 ENCOUNTER — Ambulatory Visit: Payer: Medicare HMO | Admitting: Neurology

## 2020-03-13 ENCOUNTER — Other Ambulatory Visit: Payer: Self-pay

## 2020-03-13 VITALS — BP 159/90 | HR 74 | Ht 62.0 in | Wt 209.0 lb

## 2020-03-13 DIAGNOSIS — M5441 Lumbago with sciatica, right side: Secondary | ICD-10-CM

## 2020-03-13 DIAGNOSIS — R2681 Unsteadiness on feet: Secondary | ICD-10-CM | POA: Diagnosis not present

## 2020-03-13 DIAGNOSIS — I10 Essential (primary) hypertension: Secondary | ICD-10-CM | POA: Diagnosis not present

## 2020-03-13 DIAGNOSIS — M5442 Lumbago with sciatica, left side: Secondary | ICD-10-CM

## 2020-03-13 DIAGNOSIS — R296 Repeated falls: Secondary | ICD-10-CM | POA: Diagnosis not present

## 2020-03-13 DIAGNOSIS — M5136 Other intervertebral disc degeneration, lumbar region: Secondary | ICD-10-CM

## 2020-03-13 DIAGNOSIS — G8929 Other chronic pain: Secondary | ICD-10-CM

## 2020-03-13 DIAGNOSIS — R0789 Other chest pain: Secondary | ICD-10-CM | POA: Diagnosis not present

## 2020-03-13 MED ORDER — GABAPENTIN 100 MG PO CAPS: ORAL_CAPSULE | ORAL | 0 refills | Status: DC

## 2020-03-13 NOTE — Patient Instructions (Addendum)
1.  MRI of lumbar spine without contrast 2.  Nerve conduction study lower extremities 3.  Start gabapentin and increase dose as directed.  Contact me for refill and update to determine if we need to increase dose.  4.  Follow up 4 to 6 months.  Further recommendations pending test results.  We have sent a referral to [facility] for your MRI and they will call you directly to schedule your appointment. They are located at [place]. If you need to contact them directly please call [phone].

## 2020-03-14 ENCOUNTER — Other Ambulatory Visit: Payer: Self-pay | Admitting: Family Medicine

## 2020-03-14 LAB — BASIC METABOLIC PANEL
BUN/Creatinine Ratio: 24 (ref 12–28)
BUN: 20 mg/dL (ref 8–27)
CO2: 24 mmol/L (ref 20–29)
Calcium: 10.2 mg/dL (ref 8.7–10.3)
Chloride: 97 mmol/L (ref 96–106)
Creatinine, Ser: 0.83 mg/dL (ref 0.57–1.00)
Glucose: 145 mg/dL — ABNORMAL HIGH (ref 65–99)
Potassium: 4.5 mmol/L (ref 3.5–5.2)
Sodium: 138 mmol/L (ref 134–144)
eGFR: 77 mL/min/{1.73_m2} (ref >59)

## 2020-03-14 LAB — MAGNESIUM: Magnesium: 2 mg/dL (ref 1.6–2.3)

## 2020-03-20 ENCOUNTER — Telehealth (HOSPITAL_COMMUNITY): Payer: Self-pay | Admitting: Emergency Medicine

## 2020-03-20 NOTE — Telephone Encounter (Signed)
Reaching out to patient to offer assistance regarding upcoming cardiac imaging study; pt verbalizes understanding of appt date/time, parking situation and where to check in, pre-test NPO status and medications ordered, and verified current allergies; name and call back number provided for further questions should they arise Keyontay Stolz RN Navigator Cardiac Imaging [facility] [phone] office [phone] cell  100mg metoprolol tartrate PTA Cecilie Heidel  

## 2020-03-21 ENCOUNTER — Other Ambulatory Visit: Payer: Self-pay

## 2020-03-21 ENCOUNTER — Ambulatory Visit (HOSPITAL_COMMUNITY)
Admission: RE | Admit: 2020-03-21 | Discharge: 2020-03-21 | Disposition: A | Discharge status: Home / Self Care | Payer: Medicare HMO | Source: Ambulatory Visit | Attending: Cardiology | Admitting: Cardiology

## 2020-03-21 DIAGNOSIS — I700 Atherosclerosis of aorta: Secondary | ICD-10-CM

## 2020-03-21 DIAGNOSIS — Z006 Encounter for examination for normal comparison and control in clinical research program: Secondary | ICD-10-CM

## 2020-03-21 DIAGNOSIS — I25119 Atherosclerotic heart disease of native coronary artery with unspecified angina pectoris: Secondary | ICD-10-CM | POA: Insufficient documentation

## 2020-03-21 DIAGNOSIS — R0789 Other chest pain: Secondary | ICD-10-CM | POA: Diagnosis present

## 2020-03-21 DIAGNOSIS — I10 Essential (primary) hypertension: Secondary | ICD-10-CM | POA: Diagnosis present

## 2020-03-21 IMAGING — CT CT HEART MORP W/ CTA COR W/ SCORE W/ CA W/CM &/OR W/O CM
4 of 7 series · 8 of 20 positions shown, 9 images · IV contrast (omnipaque)
Comparison: None.
COMPARISON: None.

Addendum:
EXAM:
OVER-READ INTERPRETATION  CT CHEST

The following report is an over-read performed by radiologist Dr.
JUMPER [REDACTED] on [DATE]. This
over-read does not include interpretation of cardiac or coronary
anatomy or pathology. The coronary calcium score/coronary CTA
interpretation by the cardiologist is attached.
HISTORY: Chest pain/anginal equiv, ECGs and troponins normal
Cardiac/Coronary  CT
TECHNIQUE: The patient was scanned on a Siemens Force scanner.
PROTOCOL: A 120 kV prospective scan was triggered in the descending thoracic
aorta at 111 HU's. Axial non-contrast 3 mm slices were carried out
through the heart. The data set was analyzed on a dedicated work
station and scored using the Agatston method. Gantry rotation speed
was 250 msecs and collimation was .6 mm. Beta blockade and 0.8 mg of
sl NTG was given. The 3D data set was reconstructed in 5% intervals
of the 35-75 % of the R-R cycle. Systolic and diastolic phases were
analyzed on a dedicated work station using MPR, MIP and VRT modes.
The patient received 80mL OMNIPAQUE IOHEXOL 350 MG/ML SOLN contrast.

[Series 7: best diast 76 % · axial · 0.39mm/px · z∈[-154,-113]mm · 2 of 313 slices shown]
[im 105/313  vessel]
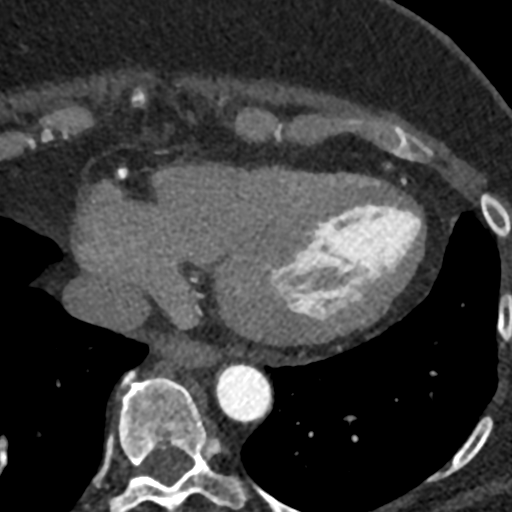
[im 209/313  vessel]
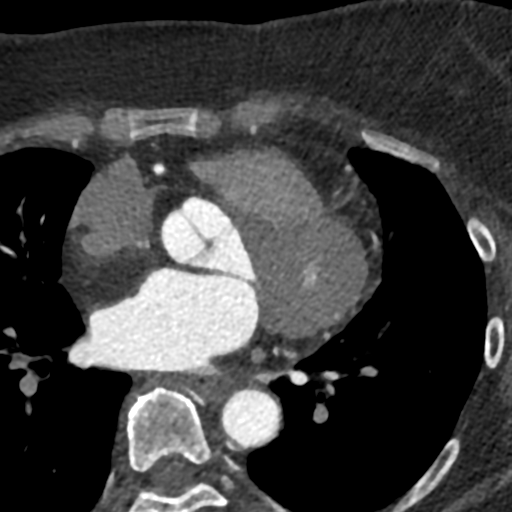

[Series 8: best syst · axial · 0.39mm/px · z∈[-154,-113]mm · 2 of 313 slices shown, 3 images]
[im 105/313  vessel]
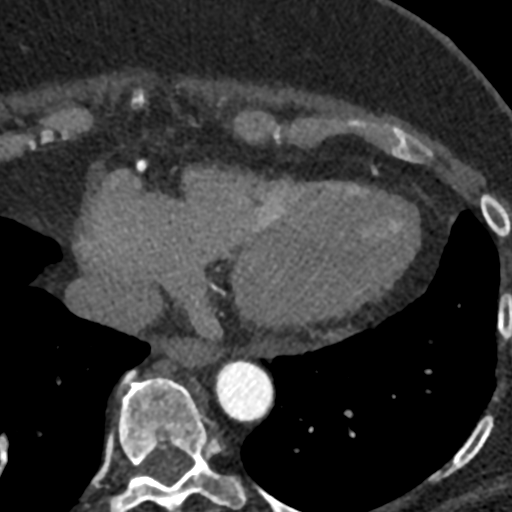
[im 105/313  lung]
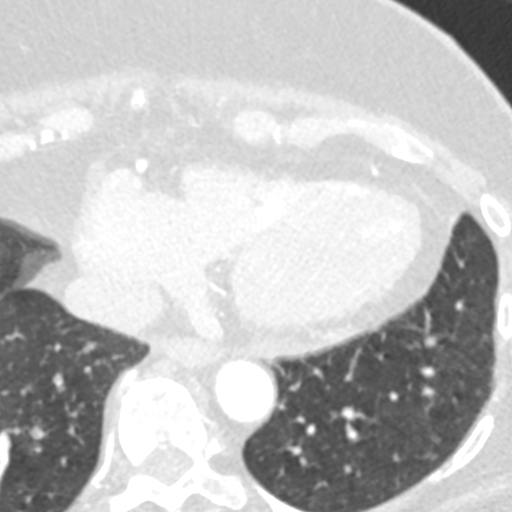
[im 209/313  vessel]
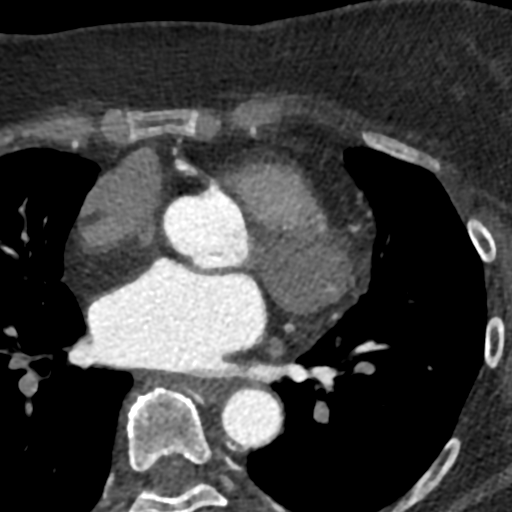

[Series 10: ts diast sharp 76 % · axial · 0.39mm/px · z∈[-154,-113]mm · 2 of 313 slices shown]
[im 105/313  lung]
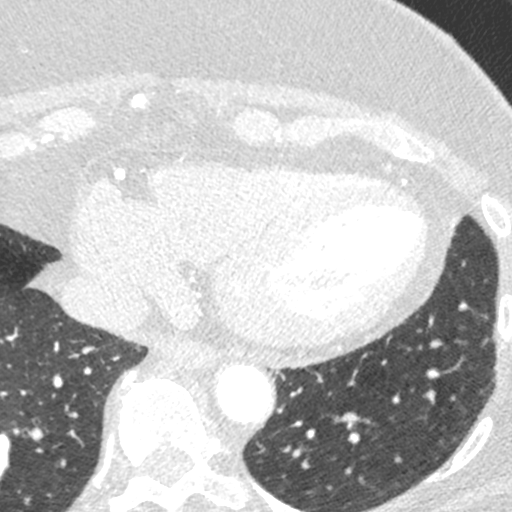
[im 209/313  lung]
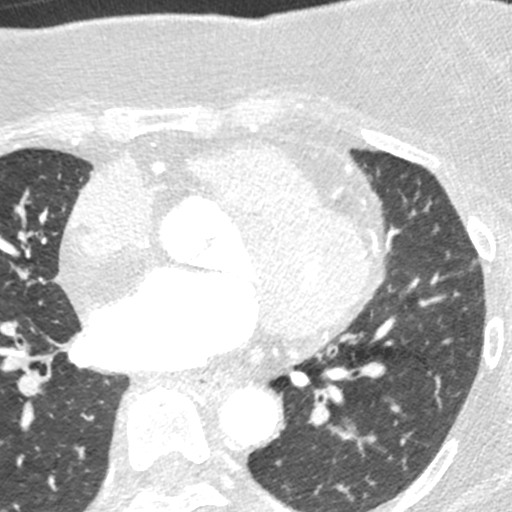

[Series 11: ts syst sharp 35 % · axial · 0.39mm/px · z∈[-154,-113]mm · 2 of 313 slices shown]
[im 105/313  lung]
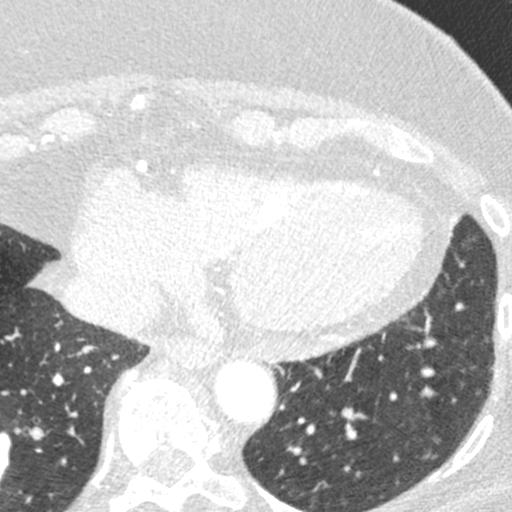
[im 209/313  lung]
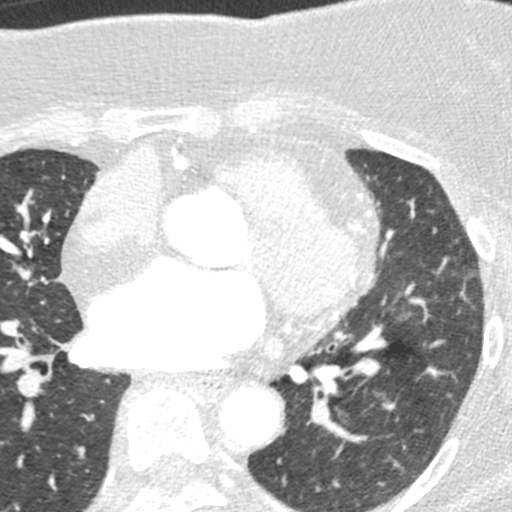

[8 of 20 positions shown; findings below may reference images not displayed]

FINDINGS: Aortic atherosclerosis. Linear area of subsegmental atelectasis
and/or scarring in the left upper lobe incidentally noted. Within
the visualized portions of the thorax there are no suspicious
appearing pulmonary nodules or masses, there is no acute
consolidative airspace disease, no pleural effusions, no
pneumothorax and no lymphadenopathy. Visualized portions of the
upper abdomen are unremarkable. There are no aggressive appearing
lytic or blastic lesions noted in the visualized portions of the
skeleton.
IMPRESSION: 1.  Aortic Atherosclerosis ([96]-[96]).
FINDINGS: Image quality: Average.

Noise artifact is: Moderate, patient motion.

Coronary calcium score is 136, which places the patient in the 80th
percentile for age and sex matched control.

Coronary arteries: Normal coronary origins.  Right dominance.

Right Coronary Artery: Minimal atherosclerotic plaque in the
proximal RCA, <25% stenosis.

Left Main Coronary Artery: No detectable plaque or stenosis.

Left Anterior Descending Coronary Artery: Minimal mixed
atherosclerotic plaque in the proximal and mid LAD, <25% stenosis.
There is significant slice misalignment due to patient motion in the
mid LAD, cannot comment definitively on mid LAD, however surrounding
vessel appears patent. Mild mixed atherosclerotic plaque in the
mid-distal LAD, 25-49% stenosis.

Left Circumflex Artery: Minimal mixed atherosclerotic plaque in
proximal and mid OM1, <25% stenosis.

Aorta: Normal size, 31 mm at the mid ascending aorta (level of the
PA bifurcation) measured double oblique. No calcifications. No
dissection.

Aortic Valve: No calcifications.

Other findings:

Normal variant pulmonary vein drainage into the left atrium, left
sided pulmonary veins share a common antrum.

Normal left atrial appendage without thrombus.

Normal size of the pulmonary artery.
IMPRESSION: 1. Mild CAD in mid-distal LAD, CADRADS = 2. Motion artifact in mid
LAD limits assessment of that segment.

2. Coronary calcium score is 136, which places the patient in the
80th percentile for age and sex matched control.

3. Normal coronary origin with right dominance.

*** End of Addendum ***
EXAM:
OVER-READ INTERPRETATION  CT CHEST

The following report is an over-read performed by radiologist Dr.
JUMPER [REDACTED] on [DATE]. This
over-read does not include interpretation of cardiac or coronary
anatomy or pathology. The coronary calcium score/coronary CTA
interpretation by the cardiologist is attached.
FINDINGS: Aortic atherosclerosis. Linear area of subsegmental atelectasis
and/or scarring in the left upper lobe incidentally noted. Within
the visualized portions of the thorax there are no suspicious
appearing pulmonary nodules or masses, there is no acute
consolidative airspace disease, no pleural effusions, no
pneumothorax and no lymphadenopathy. Visualized portions of the
upper abdomen are unremarkable. There are no aggressive appearing
lytic or blastic lesions noted in the visualized portions of the
skeleton.
IMPRESSION: 1.  Aortic Atherosclerosis ([96]-[96]).

## 2020-03-21 MED ORDER — NITROGLYCERIN 0.4 MG SL SUBL
SUBLINGUAL_TABLET | SUBLINGUAL | Status: AC
  Filled 2020-03-21: qty 2

## 2020-03-21 MED ORDER — NITROGLYCERIN 0.4 MG SL SUBL
0.8000 mg | SUBLINGUAL_TABLET | Freq: Once | SUBLINGUAL | Status: AC
  Administered 2020-03-21: 0.8 mg via SUBLINGUAL

## 2020-03-21 MED ORDER — IOHEXOL 350 MG/ML SOLN
80.0000 mL | Freq: Once | INTRAVENOUS | Status: AC | PRN
  Administered 2020-03-21: 80 mL via INTRAVENOUS

## 2020-03-21 NOTE — Research (Signed)
IDENTIFY Informed Consent                  Subject Name: Jasmine Mclaughlin    Subject met inclusion and exclusion criteria.  The informed consent form, study requirements and expectations were reviewed with the subject and questions and concerns were addressed prior to the signing of the consent form.  The subject verbalized understanding of the trial requirements.  The subject agreed to participate in the IDENTIFY trial and signed the informed consent at 11:18AM on 03/21/20.  The informed consent was obtained prior to performance of any protocol-specific procedures for the subject.  A copy of the signed informed consent was given to the subject and a copy was placed in the subject's medical record.   Meade Maw, Naval architect

## 2020-03-21 NOTE — Progress Notes (Signed)
CT scan completed. Tolerated well. D/C home ambulatory, awake and alert. In no distress. 

## 2020-03-23 ENCOUNTER — Other Ambulatory Visit: Payer: Self-pay | Admitting: Family Medicine

## 2020-03-23 ENCOUNTER — Telehealth: Payer: Self-pay

## 2020-03-23 DIAGNOSIS — F419 Anxiety disorder, unspecified: Secondary | ICD-10-CM

## 2020-03-23 NOTE — Telephone Encounter (Signed)
-----   Message from Berniece Salines, DO sent at 03/23/2020  3:28 PM EDT ----- Mild coronary artery disease.  No further diagnostic testing is required at this time.  Continue you on your Zocor.  We can discuss in detail at your follow-up visit.

## 2020-03-23 NOTE — Telephone Encounter (Signed)
Left message on patients voicemail to please return our call.   

## 2020-03-24 ENCOUNTER — Other Ambulatory Visit: Payer: Self-pay | Admitting: Family Medicine

## 2020-03-24 ENCOUNTER — Telehealth: Payer: Self-pay

## 2020-03-24 NOTE — Telephone Encounter (Signed)
Spoke with patient regarding results and recommendation.  Patient verbalizes understanding and is agreeable to plan of care. Advised patient to call back with any issues or concerns.  

## 2020-03-24 NOTE — Telephone Encounter (Signed)
-----   Message from Berniece Salines, DO sent at 03/23/2020  3:28 PM EDT ----- Mild coronary artery disease.  No further diagnostic testing is required at this time.  Continue you on your Zocor.  We can discuss in detail at your follow-up visit.

## 2020-03-27 ENCOUNTER — Encounter: Payer: Self-pay | Admitting: Family Medicine

## 2020-03-27 MED ORDER — SIMVASTATIN 20 MG PO TABS: 20.0000 mg | ORAL_TABLET | Freq: Every day | ORAL | 2 refills | Status: DC

## 2020-04-05 ENCOUNTER — Other Ambulatory Visit: Payer: Self-pay

## 2020-04-05 ENCOUNTER — Ambulatory Visit
Admission: RE | Admit: 2020-04-05 | Discharge: 2020-04-05 | Disposition: A | Discharge status: Home / Self Care | Payer: Medicare HMO | Source: Ambulatory Visit | Attending: Neurology | Admitting: Neurology

## 2020-04-05 DIAGNOSIS — M48061 Spinal stenosis, lumbar region without neurogenic claudication: Secondary | ICD-10-CM | POA: Diagnosis not present

## 2020-04-05 DIAGNOSIS — M5127 Other intervertebral disc displacement, lumbosacral region: Secondary | ICD-10-CM | POA: Diagnosis not present

## 2020-04-05 DIAGNOSIS — M4326 Fusion of spine, lumbar region: Secondary | ICD-10-CM | POA: Diagnosis not present

## 2020-04-05 DIAGNOSIS — M5126 Other intervertebral disc displacement, lumbar region: Secondary | ICD-10-CM | POA: Diagnosis not present

## 2020-04-05 IMAGING — MR MR LUMBAR SPINE W/O CM
4 of 5 series · 18 of 48 positions shown · non-contrast
Comparison: [DATE] report and prior.

CLINICAL DATA: unsteady gait

EXAM:
MRI LUMBAR SPINE WITHOUT CONTRAST
TECHNIQUE: Multiplanar, multisequence MR imaging of the lumbar spine was
performed. No intravenous contrast was administered.

[Series 6: T2 · sagittal · 4.0mm · 0.73mm/px · 6 of 15 slices shown (1 of 2)]
[im 1/15]
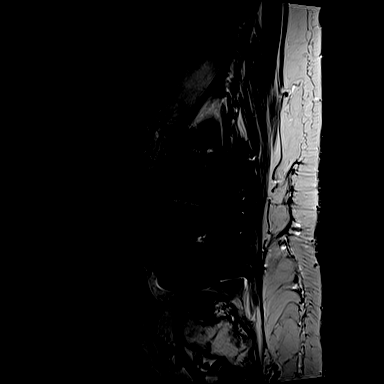
[im 3/15]
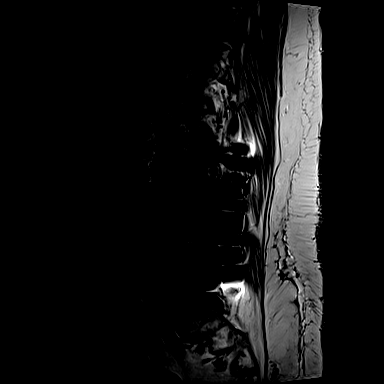
[im 6/15]
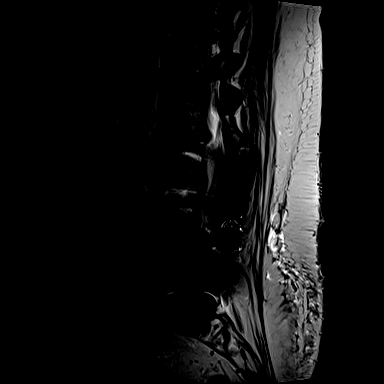
[im 9/15]
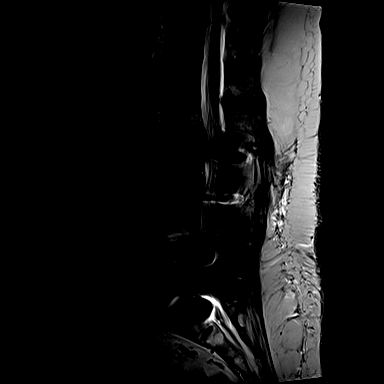
[im 12/15]
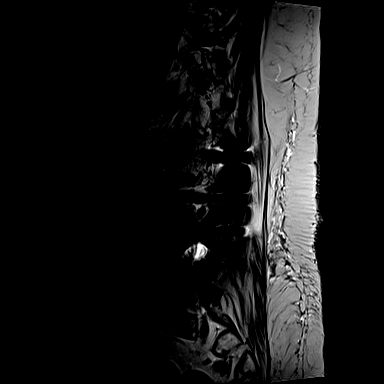
[im 15/15]
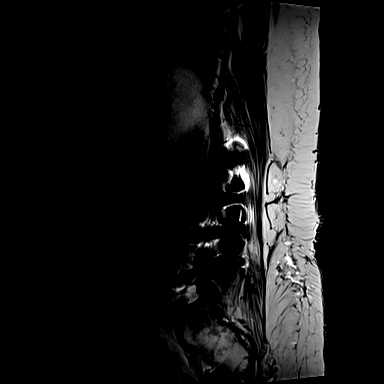

[Series 7: T1 · sagittal · 4.0mm · 0.73mm/px · 3 of 15 slices shown (1 of 2)]
[im 3/15]
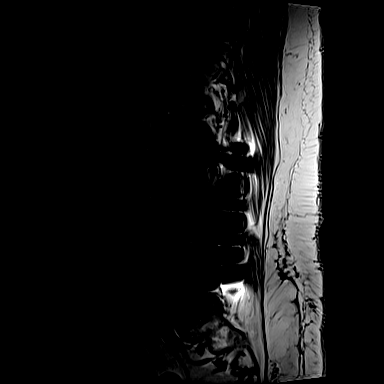
[im 9/15]
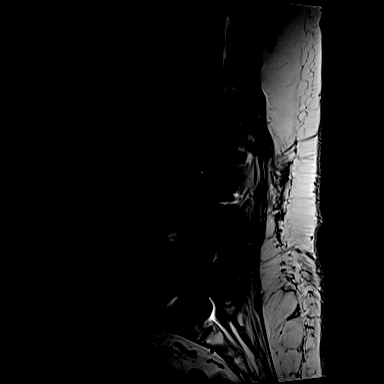
[im 15/15]
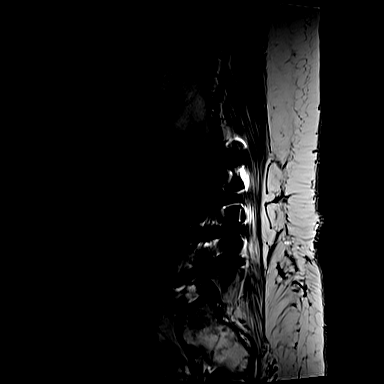

[Series 11: T1 · axial · 4.0mm · 0.28mm/px · z∈[-100,+43]mm · 3 of 39 slices shown (2 of 2)]
[im 6/39]
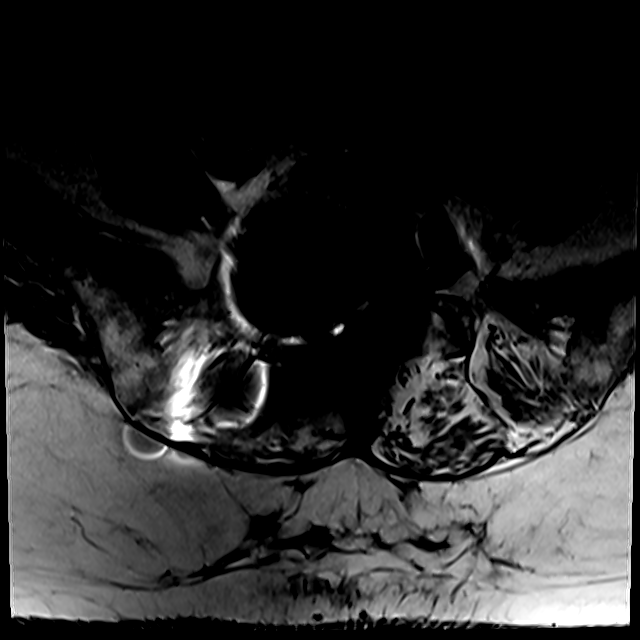
[im 20/39]
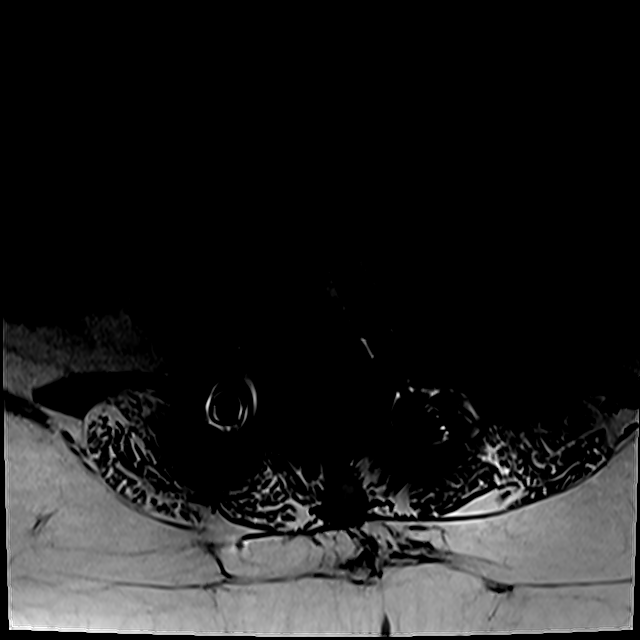
[im 33/39]
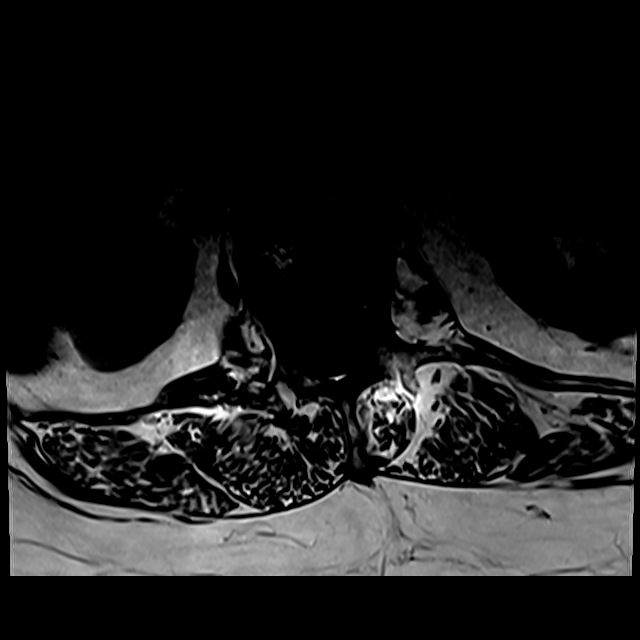

[Series 14: T2 · axial · 4.0mm · 0.28mm/px · z∈[-125,+43]mm · 6 of 39 slices shown (2 of 2)]
[im 1/39]
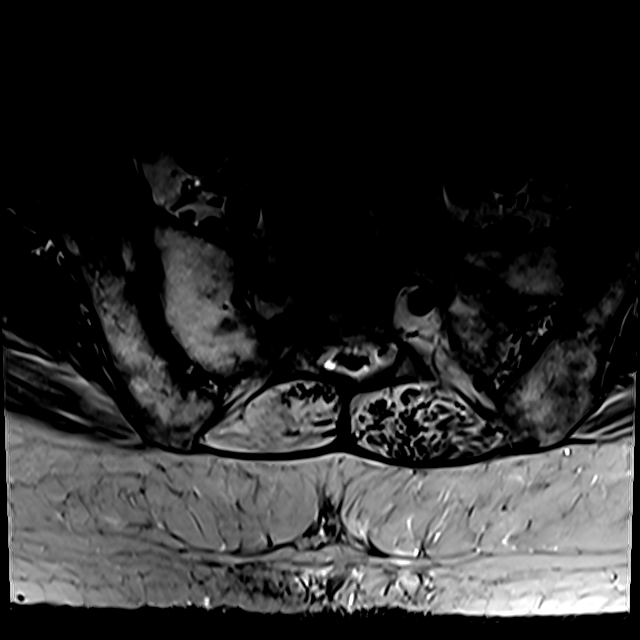
[im 6/39]
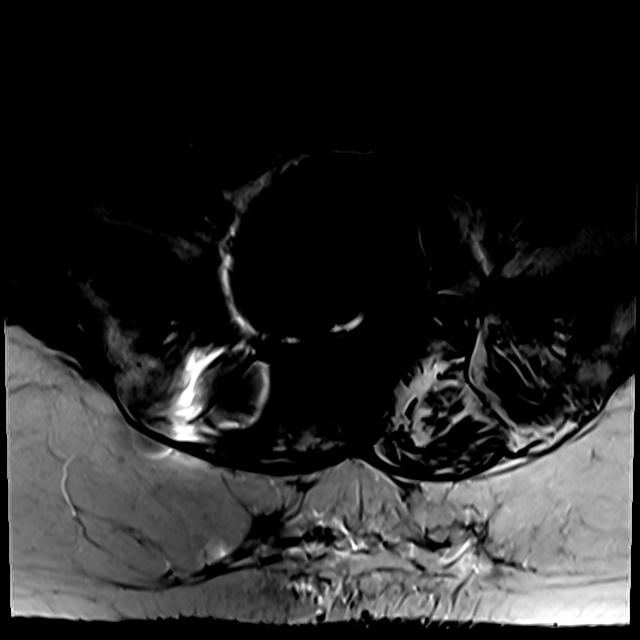
[im 11/39]
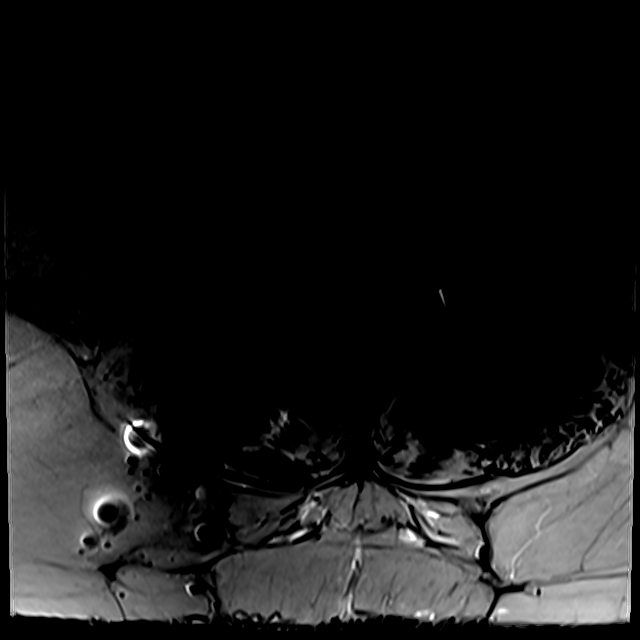
[im 17/39]
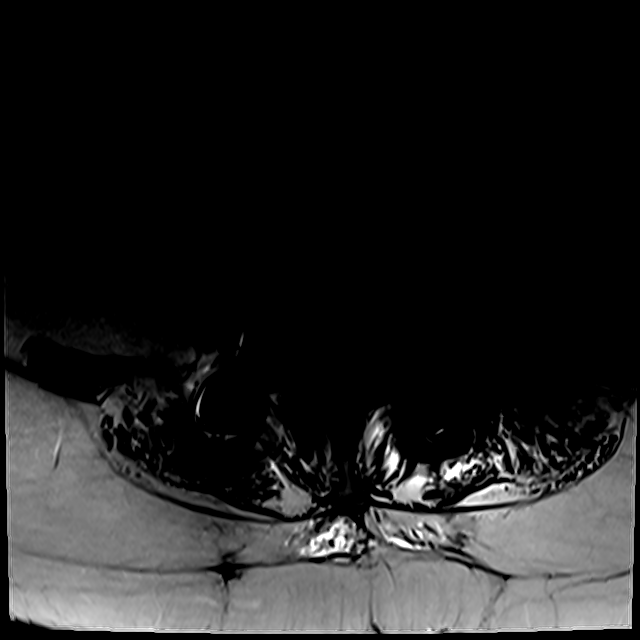
[im 20/39]
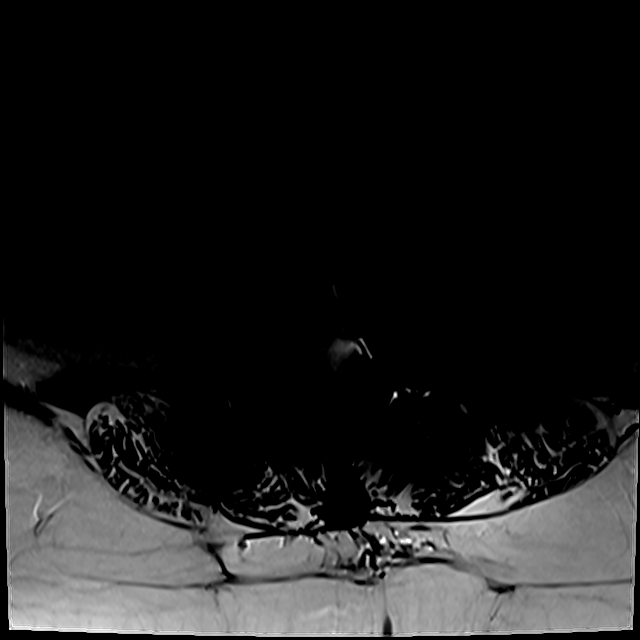
[im 33/39]
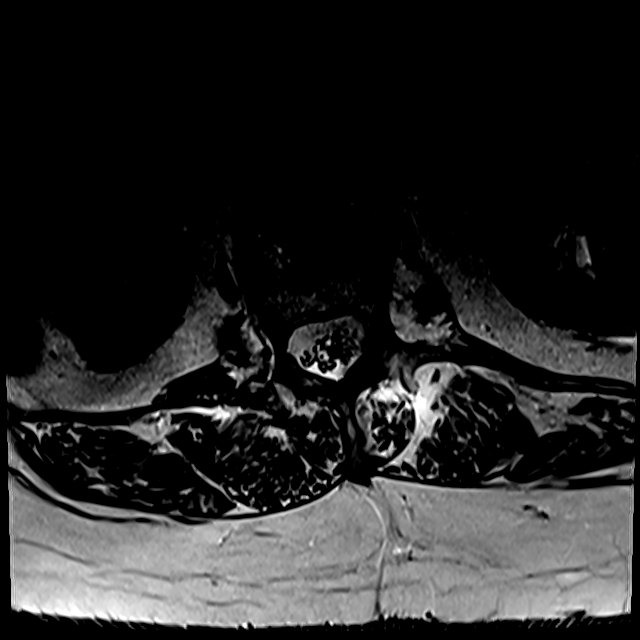

[18 of 48 positions shown; findings below may reference images not displayed]

FINDINGS: Segmentation:  Standard.

Alignment: Straightening of lordosis. Sequela of posterior spinal
fusion with hardware spanning the L2-5 levels. Associated
susceptibility artifact.

Vertebrae: Modic type 2 endplate degenerative changes. No aggressive
osseous lesion. Mild edema at the L1 level without discrete
fracture.

Conus medullaris and cauda equina: Conus extends to the L1 level.
Normal appearance of the conus. Visualized cauda equina is grossly
unremarkable.

Disc levels: Multilevel desiccation, Schmorl's node formation and
disc space loss.

T12-L1: No significant disc bulge. Bilateral facet hypertrophy.
Patent spinal canal and neural foramen.

L1-2: Disc bulge and bilateral facet degenerative spurring. Mild
spinal canal narrowing. Patent neural foramen.

L2-3: Sequela of fusion. Minimal disc bulge and bilateral facet
hypertrophy. Patent spinal canal and neural foramen.

L3-4: Sequela of fusion. No significant disc bulge. Patent spinal
canal. Patent right neural foramen. Mild left neural foraminal
narrowing. Susceptibility artifact at this level limits evaluation.

L4-5: Sequela of fusion. Susceptibility artifact limits evaluation
of the spinal canal and right neural foramen. Mild left neural
foraminal narrowing.

L5-S1: Susceptibility artifact at this level limits evaluation.
Minimal disc bulge. Mild left neural foraminal narrowing. The spinal
canal and right neural foramen are not well visualized.

Paraspinal and other soft tissues: Postsurgical appearance of the
paraspinal soft tissues.
IMPRESSION: Sequela of L2-L5 posterior spinal fusion. Associated susceptibility
artifact limits evaluation.

Mild L1-2 spinal canal narrowing.

Mild left L3-4, L4-5, L5-S1 neural foraminal narrowing.

No discrete fracture. Mild L1 bone marrow edema, likely degenerative
related versus contusion.

## 2020-04-07 NOTE — Progress Notes (Signed)
Tried calling pt, no answer. LMOVM to call back.

## 2020-04-11 ENCOUNTER — Other Ambulatory Visit: Payer: Self-pay | Admitting: Neurology

## 2020-04-11 ENCOUNTER — Other Ambulatory Visit: Payer: Self-pay

## 2020-04-11 ENCOUNTER — Other Ambulatory Visit: Payer: Self-pay | Admitting: Family Medicine

## 2020-04-12 ENCOUNTER — Other Ambulatory Visit: Payer: Self-pay

## 2020-04-12 ENCOUNTER — Other Ambulatory Visit: Payer: Self-pay | Admitting: Neurology

## 2020-04-12 NOTE — Progress Notes (Signed)
Per Dr.JAffe, If pt  would like to take 100mg  in the morning as well, that is fine. I can  send in a new gabapentin prescription to reflect that change if you like.   New script Gabapentin 100 mg 3 cap at bedtime and 100 mg in Am sent to the pharmacy.

## 2020-04-12 NOTE — Telephone Encounter (Signed)
Pt advised of note, and that Dr.Jaffe not in the office for me to okay separate orders.  Dr.Jaffe please advise

## 2020-04-13 ENCOUNTER — Other Ambulatory Visit: Payer: Self-pay | Admitting: Neurology

## 2020-04-13 ENCOUNTER — Other Ambulatory Visit: Payer: Self-pay

## 2020-04-13 MED ORDER — GABAPENTIN 100 MG PO CAPS: ORAL_CAPSULE | ORAL | 3 refills | Status: DC

## 2020-04-13 MED ORDER — GABAPENTIN 300 MG PO CAPS: 300.0000 mg | ORAL_CAPSULE | Freq: Every day | ORAL | 5 refills | Status: DC

## 2020-04-13 NOTE — Telephone Encounter (Signed)
Yes, you can send in two separate orders:  Gabapentin 100mg  capsule - 1 capsule every morning  Gabapentin 300mg  capsule - 1 capsule at bedtime

## 2020-04-18 ENCOUNTER — Ambulatory Visit: Payer: Medicare HMO | Admitting: Neurology

## 2020-04-18 ENCOUNTER — Other Ambulatory Visit: Payer: Self-pay

## 2020-04-18 DIAGNOSIS — M5136 Other intervertebral disc degeneration, lumbar region: Secondary | ICD-10-CM | POA: Diagnosis not present

## 2020-04-18 DIAGNOSIS — G8929 Other chronic pain: Secondary | ICD-10-CM

## 2020-04-18 DIAGNOSIS — R2681 Unsteadiness on feet: Secondary | ICD-10-CM

## 2020-04-18 DIAGNOSIS — M5442 Lumbago with sciatica, left side: Secondary | ICD-10-CM

## 2020-04-18 DIAGNOSIS — R296 Repeated falls: Secondary | ICD-10-CM

## 2020-04-18 NOTE — Procedures (Signed)
[facility]  [address], [address]  [place], [place] [address] Tel: [phone] Fax:  [phone] Test Date:  04/18/2020  Patient: Jasmine Mclaughlin DOB: 05-23-1950 Physician: Narda Amber, DO  Sex: Female Height: 5\' 2"  Ref Phys: Metta Clines, D.O.  ID#: [id number]   Technician:    Patient Complaints: This is a 70 year old female with history of lumbar fusion referred for evaluation of bilateral feet pain and paresthesias.  NCV & EMG Findings: Extensive electrodiagnostic testing of the right lower extremity and additional studies of the left shows:  1. Bilateral sural and superficial peroneal sensory responses are within normal limits. 2. Bilateral peroneal and tibial motor responses are within normal limits 3. Bilateral tibial H reflex studies are within normal limits. 4. Chronic motor axonal loss changes are seen affecting the L5 myotome on the right, without accompanied active denervation.  These findings are not present in the left lower extremity.  Impression: 1. Chronic L5 radiculopathy affecting the right lower extremity, mild. 2. There is no evidence of a sensorimotor polyneuropathy affecting the lower extremities.   ___________________________ Narda Amber, DO    Nerve Conduction Studies Anti Sensory Summary Table   Stim Site NR Peak (ms) Norm Peak (ms) P-T Amp (V) Norm P-T Amp  Left Sup Peroneal Anti Sensory (Ant Lat Mall)  32C  12 cm    2.8 <4.6 8.7 >3  Right Sup Peroneal Anti Sensory (Ant Lat Mall)  32C  12 cm    2.4 <4.6 9.3 >3  Left Sural Anti Sensory (Lat Mall)  32C  Calf    3.5 <4.6 11.9 >3  Right Sural Anti Sensory (Lat Mall)  32C  Calf    3.2 <4.6 12.3 >3   Motor Summary Table   Stim Site NR Onset (ms) Norm Onset (ms) O-P Amp (mV) Norm O-P Amp Site1 Site2 Delta-0 (ms) Dist (cm) Vel (m/s) Norm Vel (m/s)  Left Peroneal Motor (Ext Dig Brev)  32C  Ankle    2.4 <6.0 3.1 >2.5 B Fib Ankle 7.1 33.0 46 >40  B Fib    9.5  2.7  Poplt B Fib 2.0 9.0  45 >40  Poplt    11.5  2.7         Right Peroneal Motor (Ext Dig Brev)  32C  Ankle    3.1 <6.0 4.3 >2.5 B Fib Ankle 6.6 34.0 52 >40  B Fib    9.7  3.9  Poplt B Fib 1.7 8.0 47 >40  Poplt    11.4  3.9         Left Tibial Motor (Abd Hall Brev)  32C  Ankle    3.6 <6.0 17.4 >4 Knee Ankle 8.5 39.0 46 >40  Knee    12.1  10.0         Right Tibial Motor (Abd Hall Brev)  32C  Ankle    3.3 <6.0 13.6 >4 Knee Ankle 8.3 37.0 45 >40  Knee    11.6  9.8          H Reflex Studies   NR H-Lat (ms) Lat Norm (ms) L-R H-Lat (ms)  Left Tibial (Gastroc)  32C     32.93 <35 0.00  Right Tibial (Gastroc)  32C     32.93 <35 0.00   EMG   Side Muscle Ins Act Fibs Psw Fasc Number Recrt Dur Dur. Amp Amp. Poly Poly. Comment  Right AntTibialis Nml Nml Nml Nml 1- Rapid Some 1+ Some 1+ Some 1+ N/A  Right  Gastroc Nml Nml Nml Nml Nml Nml Nml Nml Nml Nml Nml Nml N/A  Right Flex Dig Long Nml Nml Nml Nml 1- Rapid Some 1+ Some 1+ Some 1+ N/A  Right RectFemoris Nml Nml Nml Nml Nml Nml Nml Nml Nml Nml Nml Nml N/A  Right GluteusMed Nml Nml Nml Nml 1- Rapid Some 1+ Some 1+ Some 1+ N/A  Left AntTibialis Nml Nml Nml Nml Nml Nml Nml Nml Nml Nml Nml Nml N/A  Left Gastroc Nml Nml Nml Nml Nml Nml Nml Nml Nml Nml Nml Nml N/A  Left Flex Dig Long Nml Nml Nml Nml Nml Nml Nml Nml Nml Nml Nml Nml N/A  Left RectFemoris Nml Nml Nml Nml Nml Nml Nml Nml Nml Nml Nml Nml N/A  Left GluteusMed Nml Nml Nml Nml Nml Nml Nml Nml Nml Nml Nml Nml N/A      Waveforms:

## 2020-04-19 ENCOUNTER — Telehealth: Payer: Self-pay

## 2020-04-19 DIAGNOSIS — R2681 Unsteadiness on feet: Secondary | ICD-10-CM

## 2020-04-19 DIAGNOSIS — R296 Repeated falls: Secondary | ICD-10-CM

## 2020-04-19 NOTE — Telephone Encounter (Signed)
MRI Brai W/WO Contrast order added

## 2020-04-19 NOTE — Telephone Encounter (Signed)
-----   Message from Pieter Partridge, DO sent at 04/19/2020  7:27 AM EDT ----- Nerve study does not reveal any neuropathy.  Therefore, I would like to check MRI of brain without contrast for frequent falls.

## 2020-04-24 ENCOUNTER — Encounter: Payer: Self-pay | Admitting: Family Medicine

## 2020-04-24 DIAGNOSIS — M62838 Other muscle spasm: Secondary | ICD-10-CM

## 2020-04-24 MED ORDER — TIZANIDINE HCL 4 MG PO CAPS: 4.0000 mg | ORAL_CAPSULE | Freq: Three times a day (TID) | ORAL | 1 refills | Status: DC | PRN

## 2020-05-04 ENCOUNTER — Other Ambulatory Visit: Payer: Self-pay

## 2020-05-04 ENCOUNTER — Ambulatory Visit
Admission: RE | Admit: 2020-05-04 | Discharge: 2020-05-04 | Disposition: A | Discharge status: Home / Self Care | Payer: Medicare HMO | Source: Ambulatory Visit | Attending: Neurology | Admitting: Neurology

## 2020-05-04 DIAGNOSIS — R2681 Unsteadiness on feet: Secondary | ICD-10-CM

## 2020-05-04 DIAGNOSIS — R296 Repeated falls: Secondary | ICD-10-CM

## 2020-05-04 DIAGNOSIS — S0990XA Unspecified injury of head, initial encounter: Secondary | ICD-10-CM | POA: Diagnosis not present

## 2020-05-04 IMAGING — MR MR HEAD WO/W CM
12 series · 48 of 48 positions shown · IV contrast (19ml multihance)
Comparison: None.

CLINICAL DATA: Frequent falls

EXAM:
MRI HEAD WITHOUT AND WITH CONTRAST
TECHNIQUE: Multiplanar, multiecho pulse sequences of the brain and surrounding
structures were obtained without and with intravenous contrast.
CONTRAST:  19mL MULTIHANCE GADOBENATE DIMEGLUMINE 529 MG/ML IV SOLN

[Series 2: T1 · sagittal · 5.0mm · 0.45mm/px · 1 of 25 slices shown]
[im 1/25]
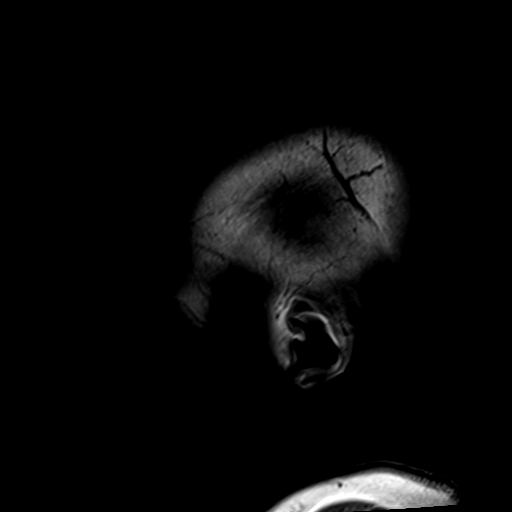

[Series 3: ax ep2d_diff_3 · axial · 3.0mm · 1.80mm/px · z∈[-53,+109]mm · 6 of 110 slices shown]
[im 1/110]
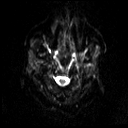
[im 22/110]
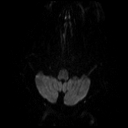
[im 44/110]
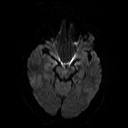
[im 66/110]
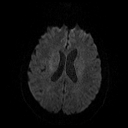
[im 88/110]
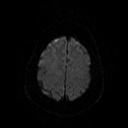
[im 110/110]
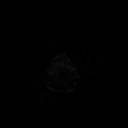

[Series 4: ax ep2d_diff_3_adc · axial · 3.0mm · 1.80mm/px · z∈[-53,+109]mm · 2 of 55 slices shown]
[im 1/55]
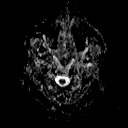
[im 55/55]
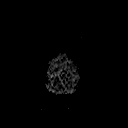

[Series 5: cor ep2d_diff · coronal · 5.0mm · 1.77mm/px · 4 of 60 slices shown]
[im 1/60]
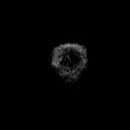
[im 20/60]
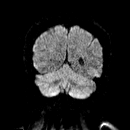
[im 40/60]
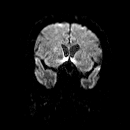
[im 60/60]
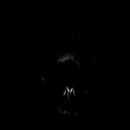

[Series 6: cor ep2d_diff_adc · coronal · 5.0mm · 1.77mm/px · 2 of 30 slices shown]
[im 1/30]
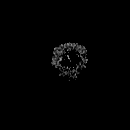
[im 30/30]
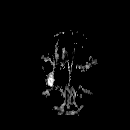

[Series 8: swi_images · axial · 2.0mm · 0.98mm/px · z∈[-50,+108]mm · 5 of 80 slices shown]
[im 1/80]
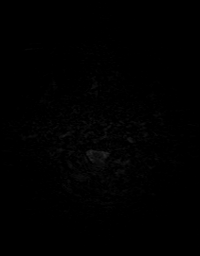
[im 20/80]
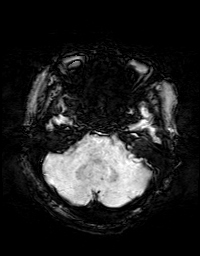
[im 40/80]
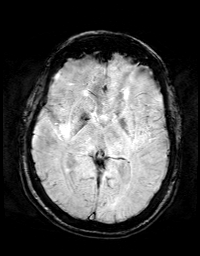
[im 60/80]
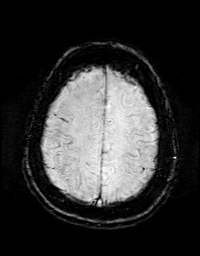
[im 80/80]
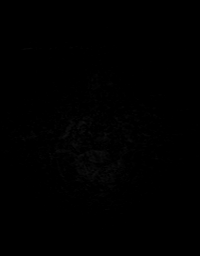

[Series 9: FLAIR · axial · 3.0mm · 0.43mm/px · z∈[-48,+104]mm · 2 of 40 slices shown]
[im 1/40]
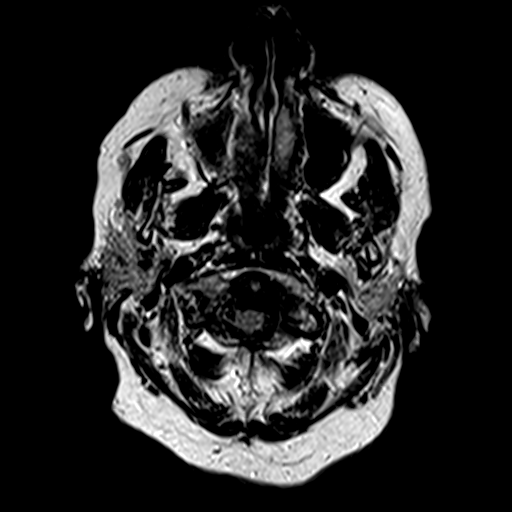
[im 40/40]
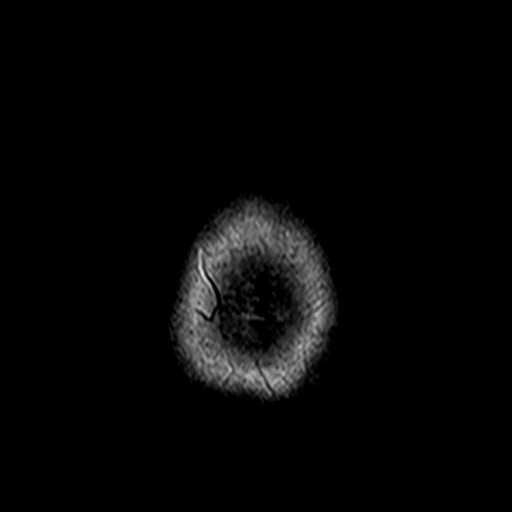

[Series 10: T2 · axial · 5.0mm · 0.65mm/px · z∈[-55,+113]mm · 2 of 29 slices shown (1 of 2)]
[im 1/29]
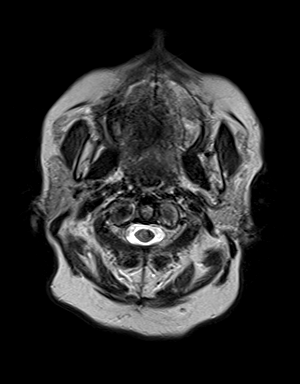
[im 29/29]
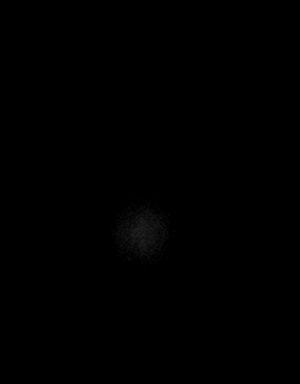

[Series 11: t1_mpr_tra · axial · 1.0mm · 0.72mm/px · z∈[-51,+107]mm · 10 of 160 slices shown]
[im 1/160]
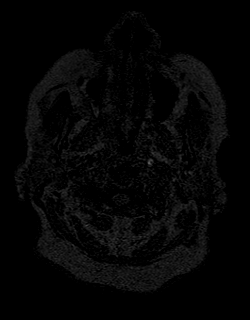
[im 18/160]
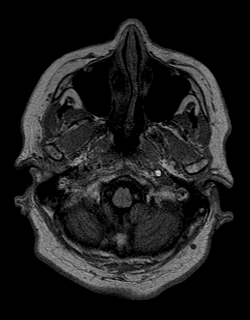
[im 36/160]
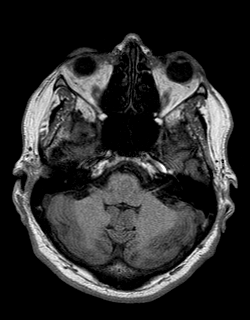
[im 54/160]
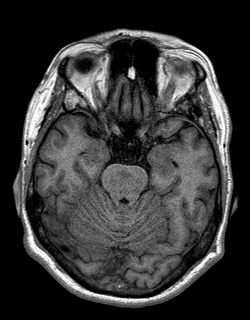
[im 71/160]
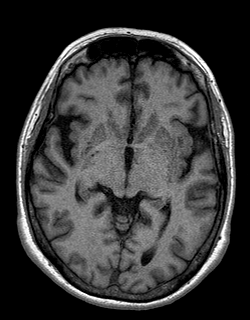
[im 89/160]
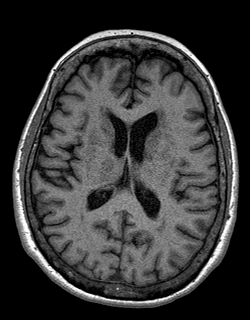
[im 107/160]
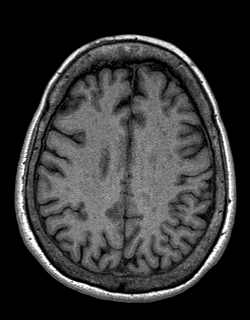
[im 124/160]
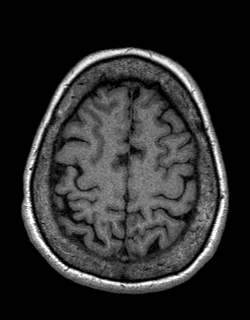
[im 142/160]
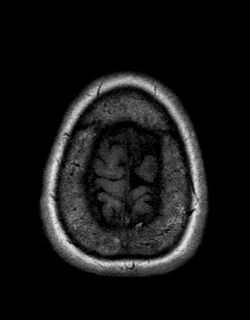
[im 160/160]
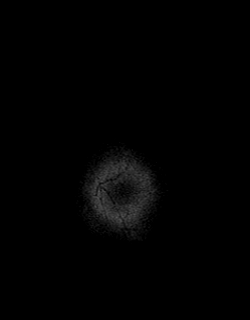

[Series 12: T2 · coronal · 5.0mm · 0.45mm/px · 2 of 31 slices shown (2 of 2)]
[im 1/31]
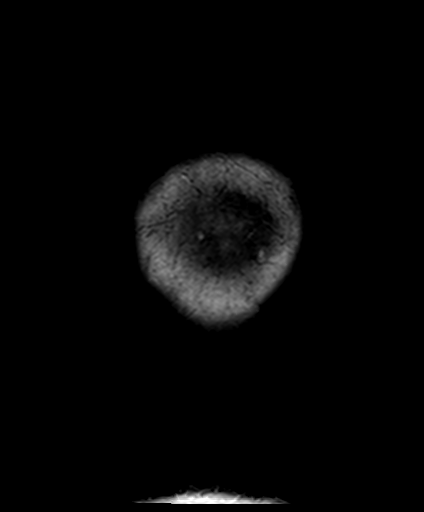
[im 31/31]
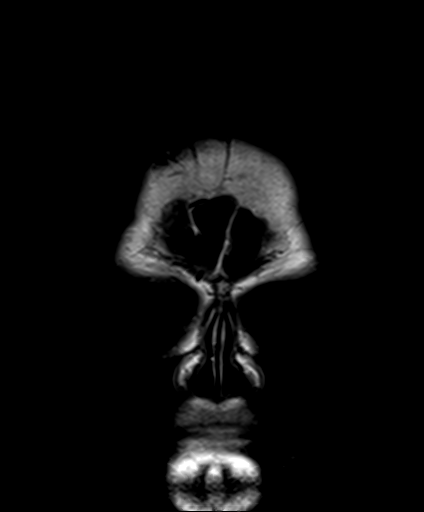

[Series 13: T1 post-contrast · coronal · 5.0mm · 0.72mm/px · 2 of 26 slices shown]
[im 1/26]
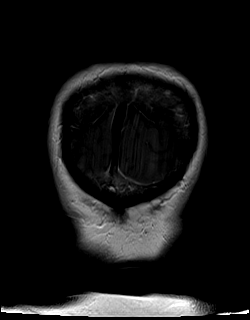
[im 26/26]
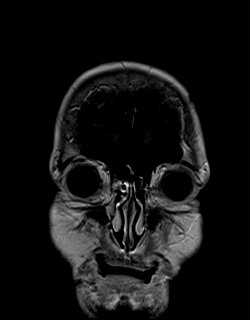

[Series 14: post t1_mpr_tra · axial · 1.0mm · 0.72mm/px · z∈[-51,+107]mm · 10 of 160 slices shown]
[im 1/160]
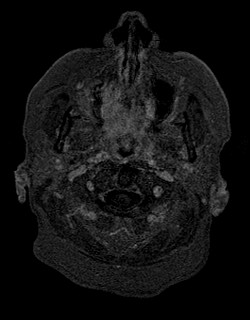
[im 18/160]
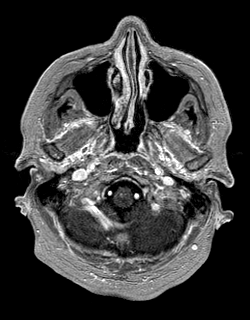
[im 36/160]
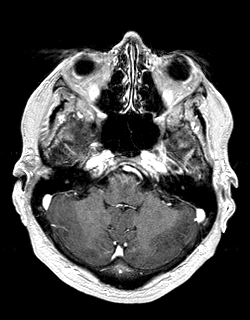
[im 54/160]
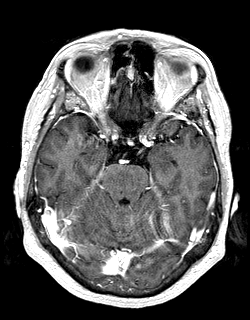
[im 71/160]
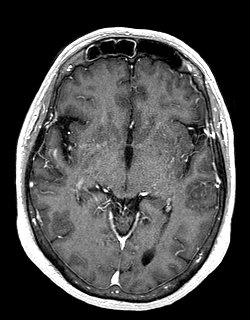
[im 89/160]
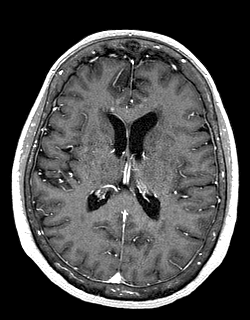
[im 107/160]
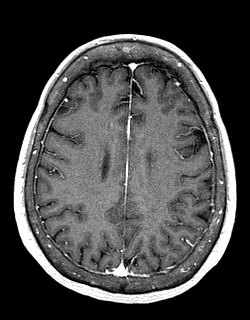
[im 124/160]
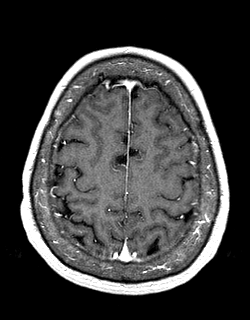
[im 142/160]
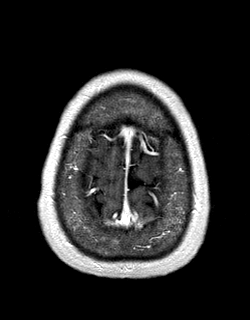
[im 160/160]
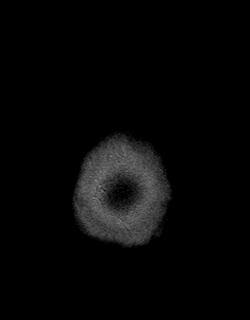

[48 of 48 positions shown; findings below may reference images not displayed]

FINDINGS: Brain: Ventricle size and cerebral volume normal for age. Moderate
changes in the cerebral white matter bilaterally. Moderate changes
in the basal ganglia, thalamus bilaterally. Extensive hyperintensity
in the pons bilaterally.

Negative for acute infarct, hemorrhage, mass.  Normal enhancement.

Vascular: Normal arterial flow voids.  Normal venous enhancement.

Skull and upper cervical spine: Negative

Sinuses/Orbits: Mild mucosal edema paranasal sinuses. Negative orbit

Other: None
IMPRESSION: No acute abnormality

Chronic microvascular ischemic change in the white matter, basal
ganglia, and pons of a moderate to advanced degree.

## 2020-05-04 MED ORDER — GADOBENATE DIMEGLUMINE 529 MG/ML IV SOLN
19.0000 mL | Freq: Once | INTRAVENOUS | Status: AC | PRN
  Administered 2020-05-04: 19 mL via INTRAVENOUS

## 2020-05-05 ENCOUNTER — Telehealth: Payer: Self-pay

## 2020-05-05 DIAGNOSIS — R296 Repeated falls: Secondary | ICD-10-CM

## 2020-05-05 DIAGNOSIS — R2681 Unsteadiness on feet: Secondary | ICD-10-CM

## 2020-05-05 NOTE — Telephone Encounter (Signed)
Your case has been sent to clinical review. You will be notified via fax within 2 business days if additional clinical information is needed. --- Pending with ins for approval.

## 2020-05-05 NOTE — Telephone Encounter (Signed)
-----   Message from Pieter Partridge, DO sent at 05/05/2020  7:22 AM EDT ----- The MRI of the brain shows chronic vascular changes in the brain but nothing that would contribute to recurrent falls.  The last thing we can check is an MRI of the cervical spine without contrast to evaluate for cervical spinal stenosis with requent falls.

## 2020-05-16 ENCOUNTER — Other Ambulatory Visit: Payer: Self-pay | Admitting: Family Medicine

## 2020-05-16 NOTE — Telephone Encounter (Signed)
Last vit D checked on 01/02/20 and was 31.21.  Do you want to continue prescription strength?

## 2020-05-19 ENCOUNTER — Other Ambulatory Visit: Payer: Medicare HMO

## 2020-05-24 ENCOUNTER — Other Ambulatory Visit: Payer: Self-pay

## 2020-05-26 ENCOUNTER — Other Ambulatory Visit: Payer: Self-pay

## 2020-05-26 ENCOUNTER — Ambulatory Visit: Payer: Medicare HMO | Admitting: Cardiology

## 2020-05-26 ENCOUNTER — Encounter: Payer: Self-pay | Admitting: Cardiology

## 2020-05-26 VITALS — BP 138/72 | HR 79 | Ht 65.0 in | Wt 211.0 lb

## 2020-05-26 DIAGNOSIS — I2510 Atherosclerotic heart disease of native coronary artery without angina pectoris: Secondary | ICD-10-CM | POA: Diagnosis not present

## 2020-05-26 DIAGNOSIS — I10 Essential (primary) hypertension: Secondary | ICD-10-CM

## 2020-05-26 DIAGNOSIS — Z720 Tobacco use: Secondary | ICD-10-CM | POA: Diagnosis not present

## 2020-05-26 DIAGNOSIS — E669 Obesity, unspecified: Secondary | ICD-10-CM | POA: Diagnosis not present

## 2020-05-26 MED ORDER — NITROGLYCERIN 0.4 MG SL SUBL: 0.4000 mg | SUBLINGUAL_TABLET | SUBLINGUAL | 3 refills | Status: AC | PRN

## 2020-05-26 NOTE — Progress Notes (Signed)
Cardiology Office Note:    Date:  05/26/2020   ID:  HARUMI COGER, DOB 1950/01/27, MRN [id number]  PCP:  Carollee Herter, Alferd Apa, DO  Cardiologist:  Berniece Salines, DO  Electrophysiologist:  None   Referring MD: Carollee Herter, Alferd Apa, *   I am doing a lot better from a heart standpoint but I do have some neurological stuff  History of Present Illness:    Jasmine Mclaughlin is a 70 y.o. female with a hx of mild coronary artery disease which was seen on her coronary CTA, COPD, current smoker, anxiety here today for follow-up visit.  At her last visit in February 2022 she still was experiencing chest discomfort despite negative nuclear stress test we will proceed with a coronary CTA.  Interim the patient was able to get a coronary CTA done which showed mild coronary artery disease.  At that time she was also hypertensive I started patient on hydrochlorothiazide 12.5 mg daily.  Today she is here for follow-up visit she has some questions of what he needs to have atherosclerosis I was able to explain this to the patient.  Past Medical History:  Diagnosis Date  . Allergy   . Anxiety   . Arthritis    degenerative back, joint disturbance "everywhere "  . Asthma   . Chest tightness 10/27/2013  . Colon polyps   . COPD (chronic obstructive pulmonary disease) ([place])   . Generalized anxiety disorder 11/13/2012  . Genital warts   . Obesity (BMI 30-39.9) 10/14/2012  . Osteoporosis   . Pneumonia   . Shortness of breath    pt./ reports that she is out of shape  . Sinusitis, acute 10/27/2013  . Tobacco abuse 09/28/2010  . Wheezing 10/27/2013    Past Surgical History:  Procedure Laterality Date  . ANTERIOR FUSION LUMBAR SPINE  05/06/2012  . ANTERIOR LAT LUMBAR FUSION N/A 05/06/2012   Procedure: ANTERIOR LATERAL LUMBAR FUSION 3 LEVELS;  Surgeon: Sinclair Ship, MD;  Location: [place] [facility];  Service: Orthopedics;  Laterality: N/A;  Lateral interbody fusion, lumbar 2-3,lumbar 3-4, lumbar 4-5 with  instrumentation and allograft.  . COLONOSCOPY    . COLONOSCOPY WITH PROPOFOL N/A 12/15/2017   Procedure: COLONOSCOPY WITH PROPOFOL;  Surgeon: Rush Landmark Telford Nab., MD;  Location: [place];  Service: Gastroenterology;  Laterality: N/A;  . COLONOSCOPY WITH PROPOFOL N/A 07/08/2018   Procedure: COLONOSCOPY WITH PROPOFOL;  Surgeon: Rush Landmark Telford Nab., MD;  Location: [place];  Service: Gastroenterology;  Laterality: N/A;  . COLONOSCOPY WITH PROPOFOL N/A 09/01/2019   Procedure: COLONOSCOPY WITH PROPOFOL;  Surgeon: Rush Landmark Telford Nab., MD;  Location: [facility];  Service: Gastroenterology;  Laterality: N/A;  . KNEE ARTHROSCOPY Right     R knee 01/2012, for ligament tears  . POLYPECTOMY  07/08/2018   Procedure: POLYPECTOMY;  Surgeon: Mansouraty, Telford Nab., MD;  Location: [place];  Service: Gastroenterology;;  . POLYPECTOMY  09/01/2019   Procedure: POLYPECTOMY;  Surgeon: Irving Copas., MD;  Location: [facility];  Service: Gastroenterology;;  . TONSILLECTOMY    . VAGINAL DELIVERY     x1    Current Medications: Current Meds  Medication Sig  . acetaminophen (TYLENOL) 500 MG tablet Take 1,000 mg by mouth 2 (two) times daily.  Marland Kitchen albuterol (VENTOLIN HFA) 108 (90 Base) MCG/ACT inhaler Inhale 2 puffs into the lungs every 6 (six) hours as needed for wheezing or shortness of breath.  Marland Kitchen alendronate (FOSAMAX) 70 MG tablet Take 1 tablet (70 mg total) by mouth once a  week. TAKE WITH A FULL GLASS OF WATER ON AN EMPTY STOMACH.  . chlorpheniramine (CHLOR-TRIMETON) 4 MG tablet Take 4 mg by mouth daily as needed for allergies.  . citalopram (CELEXA) 20 MG tablet Take 1 tablet (20 mg total) by mouth daily.  Marland Kitchen dicyclomine (BENTYL) 10 MG capsule Take 1 capsule (10 mg total) by mouth 4 (four) times daily -  before meals and at bedtime.  . diphenhydrAMINE (SOMINEX) 25 MG tablet Take 25 mg by mouth at bedtime.  . fluticasone (FLONASE) 50 MCG/ACT nasal spray Place 2 sprays into both  nostrils daily.  Marland Kitchen gabapentin (NEURONTIN) 100 MG capsule Take 100 mg in the AM  . gabapentin (NEURONTIN) 300 MG capsule Take 1 capsule (300 mg total) by mouth at bedtime.  . nitroGLYCERIN (NITROSTAT) 0.4 MG SL tablet Place 1 tablet (0.4 mg total) under the tongue every 5 (five) minutes as needed for chest pain.  . simvastatin (ZOCOR) 20 MG tablet Take 1 tablet (20 mg total) by mouth at bedtime.  Marland Kitchen tiZANidine (ZANAFLEX) 4 MG capsule Take 1 capsule (4 mg total) by mouth 3 (three) times daily as needed for muscle spasms.  . Vitamin D, Ergocalciferol, (DRISDOL) 1.25 MG (50000 UNIT) CAPS capsule TAKE 1 CAPSULE (50,000 UNITS TOTAL) BY MOUTH EVERY 7 (SEVEN) DAYS.     Allergies:   Patient has no known allergies.   Social History   Socioeconomic History  . Marital status: Single    Spouse name: Not on file  . Number of children: 1  . Years of education: Not on file  . Highest education level: Not on file  Occupational History  . 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 Smoking status: Current Every Day Smoker    Packs/day: 0.50    Years: 55.00    Pack years: 27.50    Types: Cigarettes  . Smokeless tobacco: Never Used  Vaping Use  . Vaping Use: Never used  Substance and Sexual Activity  . Alcohol use: Yes    Alcohol/week: 0.0 standard drinks    Comment: "sporatic"- wine, beer, mixed drink  . Drug use: No  . Sexual activity: Not Currently    Partners: Male  Other Topics Concern  . Not on file  Social History Narrative   1 caffeine drinks daily   No exercise   Left handed   One story home   Social Determinants of Health   Financial Resource Strain: Low Risk   . Difficulty of Paying Living Expenses: Not hard at all  Food Insecurity: No Food Insecurity  . Worried About Charity fundraiser in the Last Year: Never true  . Ran Out of Food in the Last Year: Never true  Transportation Needs: No Transportation Needs  . Lack of Transportation  (Medical): No  . Lack of Transportation (Non-Medical): No  Physical Activity: Not on file  Stress: Not on file  Social Connections: Not on file     Family History: The patient's family history includes Arthritis in her father and mother; Asthma in her daughter and maternal grandmother; Breast cancer in her maternal grandmother; Colon cancer (age of onset: 1) in her father; Heart disease (age of onset: 57) in her father; Stroke in her father. There is no history of Esophageal cancer, Ulcerative colitis, or Stomach cancer.  ROS:   Review of Systems  Constitution: Negative for decreased appetite, fever and weight gain.  HENT: Negative for congestion, ear discharge, hoarse voice  and sore throat.   Eyes: Negative for discharge, redness, vision loss in right eye and visual halos.  Cardiovascular: Negative for chest pain, dyspnea on exertion, leg swelling, orthopnea and palpitations.  Respiratory: Negative for cough, hemoptysis, shortness of breath and snoring.   Endocrine: Negative for heat intolerance and polyphagia.  Hematologic/Lymphatic: Negative for bleeding problem. Does not bruise/bleed easily.  Skin: Negative for flushing, nail changes, rash and suspicious lesions.  Musculoskeletal: Negative for arthritis, joint pain, muscle cramps, myalgias, neck pain and stiffness.  Gastrointestinal: Negative for abdominal pain, bowel incontinence, diarrhea and excessive appetite.  Genitourinary: Negative for decreased libido, genital sores and incomplete emptying.  Neurological: Negative for brief paralysis, focal weakness, headaches and loss of balance.  Psychiatric/Behavioral: Negative for altered mental status, depression and suicidal ideas.  Allergic/Immunologic: Negative for HIV exposure and persistent infections.    EKGs/Labs/Other Studies Reviewed:    The following studies were reviewed today:   EKG: None today  Coronary CT done March 21, 2020 Noise artifact is: Moderate, patient  motion.  Coronary calcium score is 136, which places the patient in the 80th percentile for age and sex matched control.  Coronary arteries: Normal coronary origins.  Right dominance.  Right Coronary Artery: Minimal atherosclerotic plaque in the proximal RCA, <25% stenosis.  Left Main Coronary Artery: No detectable plaque or stenosis.  Left Anterior Descending Coronary Artery: Minimal mixed atherosclerotic plaque in the proximal and mid LAD, <25% stenosis. There is significant slice misalignment due to patient motion in the mid LAD, cannot comment definitively on mid LAD, however surrounding vessel appears patent. Mild mixed atherosclerotic plaque in the mid-distal LAD, 25-49% stenosis.  Left Circumflex Artery: Minimal mixed atherosclerotic plaque in proximal and mid OM1, <25% stenosis.  Aorta: Normal size, 31 mm at the mid ascending aorta (level of the PA bifurcation) measured double oblique. No calcifications. No dissection.  Aortic Valve: No calcifications.  Other findings:  Normal variant pulmonary vein drainage into the left atrium, left sided pulmonary veins share a common antrum.  Normal left atrial appendage without thrombus.  Normal size of the pulmonary artery.  IMPRESSION: 1. Mild CAD in mid-distal LAD, CADRADS = 2. Motion artifact in mid LAD limits assessment of that segment.  2. Coronary calcium score is 136, which places the patient in the 80th percentile for age and sex matched control.  3. Normal coronary origin with right dominance.   Nuclear stress testImpression: 2021 1. Normal myocardial perfusion imaging study without evidence of ischemia or infarction.  2. Normal LVEF, 62%. 3. Brief ectopic atrial rhythm (6 beat duration) captured on baseline ECGs. 4. This is a low-risk study.   EchoIMPRESSIONS March 2021 1. Left ventricular ejection fraction, by estimation, is 60 to 65%. The  left ventricle has normal function. The  left ventricle has no regional  wall motion abnormalities. There is mild concentric left ventricular  hypertrophy. Left ventricular diastolic  parameters are consistent with Grade II diastolic dysfunction  (pseudonormalization). The average left ventricular global longitudinal  strain is -16.6 %.  2. Right ventricular systolic function is normal. The right ventricular  size is normal. There is normal pulmonary artery systolic pressure.  3. The mitral valve is normal in structure. Trivial mitral valve  regurgitation. No evidence of mitral stenosis.  4. The aortic valve is tricuspid. Aortic valve regurgitation is not  visualized. No aortic stenosis is present.  5. The inferior vena cava is normal in size with greater than 50%  respiratory variability, suggesting right atrial pressure of 3  mmHg.   FINDINGS  Left Ventricle: Left ventricular ejection fraction, by estimation, is 60  to 65%. The left ventricle has normal function. The left ventricle has no  regional wall motion abnormalities. The average left ventricular global  longitudinal strain is -16.6 %.  The left ventricular internal cavity size was normal in size. There is  mild concentric left ventricular hypertrophy. Left ventricular diastolic  parameters are consistent with Grade II diastolic dysfunction  (pseudonormalization).   Right Ventricle: The right ventricular size is normal. No increase in  right ventricular wall thickness. Right ventricular systolic function is  normal. There is normal pulmonary artery systolic pressure. The tricuspid  regurgitant velocity is 1.30 m/s, and  with an assumed right atrial pressure of 3 mmHg, the estimated right  ventricular systolic pressure is 9.8 mmHg.   Left Atrium: Left atrial size was normal in size.   Right Atrium: Right atrial size was normal in size.   Pericardium: Trivial pericardial effusion is present. The pericardial  effusion is posterior to the left ventricle.    Mitral Valve: The mitral valve is normal in structure. Normal mobility of  the mitral valve leaflets. Trivial mitral valve regurgitation. No evidence  of mitral valve stenosis.   Tricuspid Valve: The tricuspid valve is normal in structure. Tricuspid  valve regurgitation is not demonstrated. No evidence of tricuspid  stenosis.   Aortic Valve: The aortic valve is tricuspid. Aortic valve regurgitation is  not visualized. No aortic stenosis is present.   Pulmonic Valve: The pulmonic valve was normal in structure. Pulmonic valve  regurgitation is not visualized. No evidence of pulmonic stenosis.   Aorta: The aortic root and ascending aorta are structurally normal, with  no evidence of dilitation.   Venous: A normal flow pattern is recorded from the right upper pulmonary  vein. The inferior vena cava is normal in size with greater than 50%  respiratory variability, suggesting right atrial pressure of 3 mmHg.   IAS/Shunts: No atrial level shunt detected by color flow Doppler.    Recent Labs: 09/23/2019: TSH 0.95 12/01/2019: Hemoglobin 13.8; Platelets 335.0 12/23/2019: ALT 11 03/13/2020: BUN 20; Creatinine, Ser 0.83; Magnesium 2.0; Potassium 4.5; Sodium 138  Recent Lipid Panel    Component Value Date/Time   CHOL 160 03/07/2020 1153   TRIG 153.0 (H) 03/07/2020 1153   HDL 52.40 03/07/2020 1153   CHOLHDL 3 03/07/2020 1153   VLDL 30.6 03/07/2020 1153   LDLCALC 77 03/07/2020 1153   LDLCALC 117 (H) 09/23/2019 0952   LDLDIRECT 135.0 12/23/2019 1126    Physical Exam:    VS:  BP 138/72   Pulse 79   Ht 5\' 5"  (1.651 m)   Wt 211 lb (95.7 kg)   SpO2 96%   BMI 35.11 kg/m     Wt Readings from Last 3 Encounters:  05/26/20 211 lb (95.7 kg)  03/13/20 209 lb (94.8 kg)  02/25/20 208 lb (94.3 kg)     GEN: Well nourished, well developed in no acute distress HEENT: Normal NECK: No JVD; No carotid bruits LYMPHATICS: No lymphadenopathy CARDIAC: S1S2 noted,RRR, no murmurs, rubs,  gallops RESPIRATORY:  Clear to auscultation without rales, wheezing or rhonchi  ABDOMEN: Soft, non-tender, non-distended, +bowel sounds, no guarding. EXTREMITIES: No edema, No cyanosis, no clubbing MUSCULOSKELETAL:  No deformity  SKIN: Warm and dry NEUROLOGIC:  Alert and oriented x 3, non-focal PSYCHIATRIC:  Normal affect, good insight  ASSESSMENT:    1. Coronary artery disease involving native coronary artery of native heart, unspecified whether  angina present   2. Obesity (BMI 30-39.9)   3. Tobacco use   4. Hypertension, unspecified type    PLAN:     We talked about her coronary CTA showing mild coronary artery disease.  The patient had questions about risk reduction.  I educated the patient to continue her aspirin as well as her Crestor.  Smoking cessation will help greatly.  She has started to improve her diet and increase exercise.  Her blood pressure has improved we will continue her current medication regimen which includes hydrochlorothiazide 12.5 mg daily.  Hyperlipidemia - continue with current statin medication.  The patient understands the need to lose weight with diet and exercise. We have discussed specific strategies for this.   The patient is in agreement with the above plan. The patient left the office in stable condition.  The patient will follow up in   Medication Adjustments/Labs and Tests Ordered: Current medicines are reviewed at length with the patient today.  Concerns regarding medicines are outlined above.  No orders of the defined types were placed in this encounter.  Meds ordered this encounter  Medications  . nitroGLYCERIN (NITROSTAT) 0.4 MG SL tablet    Sig: Place 1 tablet (0.4 mg total) under the tongue every 5 (five) minutes as needed for chest pain.    Dispense:  90 tablet    Refill:  3    Patient Instructions  Medication Instructions:  Your physician has recommended you make the following change in your medication: START: .Nitroglycerin  0.4 mg take one tablet by mouth every 5 minutes up to three times as needed for chest pain.  *If you need a refill on your cardiac medications before your next appointment, please call your pharmacy*   Lab Work: None If you have labs (blood work) drawn today and your tests are completely normal, you will receive your results only by: Marland Kitchen MyChart Message (if you have MyChart) OR . A paper copy in the mail If you have any lab test that is abnormal or we need to change your treatment, we will call you to review the results.   Testing/Procedures: None   Follow-Up: At [facility], you and your health needs are our priority.  As part of our continuing mission to provide you with exceptional heart care, we have created designated Provider Care Teams.  These Care Teams include your primary Cardiologist (physician) and Advanced Practice Providers (APPs -  Physician Assistants and Nurse Practitioners) who all work together to provide you with the care you need, when you need it.  We recommend signing up for the patient portal called "MyChart".  Sign up information is provided on this After Visit Summary.  MyChart is used to connect with patients for Virtual Visits (Telemedicine).  Patients are able to view lab/test results, encounter notes, upcoming appointments, etc.  Non-urgent messages can be sent to your provider as well.   To learn more about what you can do with MyChart, go to [email or web address].    Your next appointment:   1 year(s)  The format for your next appointment:   In Person  Provider:   Berniece Salines, DO   Other Instructions      Adopting a Healthy Lifestyle.  Know what a healthy weight is for you (roughly BMI <25) and aim to maintain this   Aim for 7+ servings of fruits and vegetables daily   65-80+ fluid ounces of water or unsweet tea for healthy kidneys   Limit to max 1 drink  of alcohol per day; avoid smoking/tobacco   Limit animal fats in diet for  cholesterol and heart health - choose grass fed whenever available   Avoid highly processed foods, and foods high in saturated/trans fats   Aim for low stress - take time to unwind and care for your mental health   Aim for 150 min of moderate intensity exercise weekly for heart health, and weights twice weekly for bone health   Aim for 7-9 hours of sleep daily   When it comes to diets, agreement about the perfect plan isnt easy to find, even among the experts. Experts at the [employer] developed an idea known as the Healthy Eating Plate. Just imagine a plate divided into logical, healthy portions.   The emphasis is on diet quality:   Load up on vegetables and fruits - one-half of your plate: Aim for color and variety, and remember that potatoes dont count.   Go for whole grains - one-quarter of your plate: Whole wheat, barley, wheat berries, quinoa, oats, brown rice, and foods made with them. If you want pasta, go with whole wheat pasta.   Protein power - one-quarter of your plate: Fish, chicken, beans, and nuts are all healthy, versatile protein sources. Limit red meat.   The diet, however, does go beyond the plate, offering a few other suggestions.   Use healthy plant oils, such as olive, canola, soy, corn, sunflower and peanut. Check the labels, and avoid partially hydrogenated oil, which have unhealthy trans fats.   If youre thirsty, drink water. Coffee and tea are good in moderation, but skip sugary drinks and limit milk and dairy products to one or two daily servings.   The type of carbohydrate in the diet is more important than the amount. Some sources of carbohydrates, such as vegetables, fruits, whole grains, and beans-are healthier than others.   Finally, stay active  Signed, Berniece Salines, DO  05/26/2020 1:27 PM    [facility]

## 2020-05-26 NOTE — Patient Instructions (Signed)
Medication Instructions:  Your physician has recommended you make the following change in your medication: START: .Nitroglycerin 0.4 mg take one tablet by mouth every 5 minutes up to three times as needed for chest pain.  *If you need a refill on your cardiac medications before your next appointment, please call your pharmacy*   Lab Work: None If you have labs (blood work) drawn today and your tests are completely normal, you will receive your results only by: Marland Kitchen MyChart Message (if you have MyChart) OR . A paper copy in the mail If you have any lab test that is abnormal or we need to change your treatment, we will call you to review the results.   Testing/Procedures: None   Follow-Up: At [facility], you and your health needs are our priority.  As part of our continuing mission to provide you with exceptional heart care, we have created designated Provider Care Teams.  These Care Teams include your primary Cardiologist (physician) and Advanced Practice Providers (APPs -  Physician Assistants and Nurse Practitioners) who all work together to provide you with the care you need, when you need it.  We recommend signing up for the patient portal called "MyChart".  Sign up information is provided on this After Visit Summary.  MyChart is used to connect with patients for Virtual Visits (Telemedicine).  Patients are able to view lab/test results, encounter notes, upcoming appointments, etc.  Non-urgent messages can be sent to your provider as well.   To learn more about what you can do with MyChart, go to [email or web address].    Your next appointment:   1 year(s)  The format for your next appointment:   In Person  Provider:   Berniece Salines, DO   Other Instructions

## 2020-06-09 ENCOUNTER — Emergency Department (INDEPENDENT_AMBULATORY_CARE_PROVIDER_SITE_OTHER)
Admission: EM | Admit: 2020-06-09 | Discharge: 2020-06-09 | Disposition: A | Discharge status: Home / Self Care | Payer: Medicare HMO | Source: Home / Self Care

## 2020-06-09 ENCOUNTER — Other Ambulatory Visit: Payer: Self-pay

## 2020-06-09 DIAGNOSIS — W19XXXA Unspecified fall, initial encounter: Secondary | ICD-10-CM

## 2020-06-09 DIAGNOSIS — S0083XA Contusion of other part of head, initial encounter: Secondary | ICD-10-CM

## 2020-06-09 DIAGNOSIS — Y92009 Unspecified place in unspecified non-institutional (private) residence as the place of occurrence of the external cause: Secondary | ICD-10-CM

## 2020-06-09 MED ORDER — MUPIROCIN CALCIUM 2 % EX CREA: 1.0000 "application " | TOPICAL_CREAM | Freq: Two times a day (BID) | CUTANEOUS | 0 refills | Status: DC

## 2020-06-09 NOTE — Discharge Instructions (Signed)
Your exam does not show you have bone pain on your face, therefore I dont believe you need any imaging. Clean the cuts with soap and water and apply the antibiotic ointment twice a day for 2 days If you develop dizziness, severe headache and vomiting, go to the ER right away Your Vit B12 was low last year, so I suggest you get on 1000 mcg sublingual a day and see if this helps your feet numbness Also try [place] chi exercises to help your balance, but have someone there with you when you start.

## 2020-06-09 NOTE — ED Triage Notes (Addendum)
Pt st she fell last night when she was pulling the flowers out into the rain on the deck she bent forward and kept going. Pt st she hit her L side pretty hard and has an abrasion on her L face. Pt st she hit her L knee. Pt daughter st pt fell in restroom for a second time. Pt daughter st she fell back in january. Daughter concerned about who to reach out to due to increased falls. Pt seeing both a neurologist and a cardiologist. Daughter concerned for mothers safety.

## 2020-06-09 NOTE — ED Provider Notes (Signed)
[facility]    CSN: [id number] Arrival date & time: 06/09/20  0923      History   Chief Complaint Chief Complaint  Patient presents with  . Fall  . Abrasion    HPI Jasmine Mclaughlin is a 70 y.o. female presents with her daughter due to having a couple of falls this week and she fell on her  L face last night when she was trying to get her plant pots. She does not know what caused her to fall. It was raining and when she fell she could not get up for about 15 minutes. She denies LOC. She denies pain anywhere else but her face, though she abraded her L elbow and L knee, but they are not badly painful. She has medial feel neuropathy per her neurologist, who is still doing more work up to find out cause of her falls. She is also under cardiology work up. Her daughter wants to know how pt could get an eval for her falls. Has not requested this from PCP.     Past Medical History:  Diagnosis Date  . Allergy   . Anxiety   . Arthritis    degenerative back, joint disturbance "everywhere "  . Asthma   . Chest tightness 10/27/2013  . Colon polyps   . COPD (chronic obstructive pulmonary disease) ([place])   . Generalized anxiety disorder 11/13/2012  . Genital warts   . Obesity (BMI 30-39.9) 10/14/2012  . Osteoporosis   . Pneumonia   . Shortness of breath    pt./ reports that she is out of shape  . Sinusitis, acute 10/27/2013  . Tobacco abuse 09/28/2010  . Wheezing 10/27/2013    Patient Active Problem List   Diagnosis Date Noted  . Primary hypertension 12/23/2019  . Vitamin D deficiency 12/23/2019  . Muscle spasm 12/23/2019  . Atypical chest pain 08/25/2019  . Tobacco use 08/25/2019  . Shortness of breath   . Pneumonia   . Osteoporosis   . Genital warts   . Colon polyps   . Arthritis   . Anxiety   . Tubular adenoma of colon 11/18/2017  . Allergic rhinitis 01/24/2015  . COPD (chronic obstructive pulmonary disease) ([place]) 11/22/2013  . Wheezing 10/27/2013  . Chest tightness  10/27/2013  . Sinusitis, acute 10/27/2013  . Generalized anxiety disorder 11/13/2012  . Degenerative disc disease, lumbar 10/14/2012  . Obesity (BMI 30-39.9) 10/14/2012  . History of colonic polyps 09/28/2010  . Osteoarthritis 09/28/2010  . Tobacco abuse 09/28/2010    Past Surgical History:  Procedure Laterality Date  . ANTERIOR FUSION LUMBAR SPINE  05/06/2012  . ANTERIOR LAT LUMBAR FUSION N/A 05/06/2012   Procedure: ANTERIOR LATERAL LUMBAR FUSION 3 LEVELS;  Surgeon: Sinclair Ship, MD;  Location: [facility];  Service: Orthopedics;  Laterality: N/A;  Lateral interbody fusion, lumbar 2-3,lumbar 3-4, lumbar 4-5 with instrumentation and allograft.  . COLONOSCOPY    . COLONOSCOPY WITH PROPOFOL N/A 12/15/2017   Procedure: COLONOSCOPY WITH PROPOFOL;  Surgeon: Rush Landmark Telford Nab., MD;  Location: [facility];  Service: Gastroenterology;  Laterality: N/A;  . COLONOSCOPY WITH PROPOFOL N/A 07/08/2018   Procedure: COLONOSCOPY WITH PROPOFOL;  Surgeon: Rush Landmark Telford Nab., MD;  Location: [facility];  Service: Gastroenterology;  Laterality: N/A;  . COLONOSCOPY WITH PROPOFOL N/A 09/01/2019   Procedure: COLONOSCOPY WITH PROPOFOL;  Surgeon: Rush Landmark Telford Nab., MD;  Location: [facility];  Service: Gastroenterology;  Laterality: N/A;  . KNEE ARTHROSCOPY Right     R knee 01/2012, for  ligament tears  . POLYPECTOMY  07/08/2018   Procedure: POLYPECTOMY;  Surgeon: Mansouraty, Telford Nab., MD;  Location: [facility];  Service: Gastroenterology;;  . POLYPECTOMY  09/01/2019   Procedure: POLYPECTOMY;  Surgeon: Irving Copas., MD;  Location: [facility];  Service: Gastroenterology;;  . TONSILLECTOMY    . VAGINAL DELIVERY     x1    OB History   No obstetric history on file.      Home Medications    Prior to Admission medications   Medication Sig Start Date End Date Taking? Authorizing Provider  mupirocin cream (BACTROBAN) 2 % Apply 1 application topically 2 (two) times daily. 06/09/20   Yes Rodriguez-Southworth, Sunday Spillers, PA-C  acetaminophen (TYLENOL) 500 MG tablet Take 1,000 mg by mouth 2 (two) times daily.    [provider]  albuterol (VENTOLIN HFA) 108 (90 Base) MCG/ACT inhaler Inhale 2 puffs into the lungs every 6 (six) hours as needed for wheezing or shortness of breath. 09/23/19   Ann Held, DO  alendronate (FOSAMAX) 70 MG tablet Take 1 tablet (70 mg total) by mouth once a week. TAKE WITH A FULL GLASS OF WATER ON AN EMPTY STOMACH. 03/14/20   Carollee Herter, Alferd Apa, DO  chlorpheniramine (CHLOR-TRIMETON) 4 MG tablet Take 4 mg by mouth daily as needed for allergies.    [provider]  citalopram (CELEXA) 20 MG tablet Take 1 tablet (20 mg total) by mouth daily. 03/23/20   Ann Held, DO  dicyclomine (BENTYL) 10 MG capsule Take 1 capsule (10 mg total) by mouth 4 (four) times daily -  before meals and at bedtime. 11/30/19   Ann Held, DO  diphenhydrAMINE (SOMINEX) 25 MG tablet Take 25 mg by mouth at bedtime.    [provider]  fluticasone (FLONASE) 50 MCG/ACT nasal spray Place 2 sprays into both nostrils daily. 12/23/19   Ann Held, DO  gabapentin (NEURONTIN) 100 MG capsule Take 100 mg in the AM 04/13/20   Pieter Partridge, DO  gabapentin (NEURONTIN) 300 MG capsule Take 1 capsule (300 mg total) by mouth at bedtime. 04/13/20   Pieter Partridge, DO  hydrochlorothiazide (MICROZIDE) 12.5 MG capsule Take 1 capsule (12.5 mg total) by mouth daily. 02/25/20 05/25/20  Tobb, Kardie, DO  nitroGLYCERIN (NITROSTAT) 0.4 MG SL tablet Place 1 tablet (0.4 mg total) under the tongue every 5 (five) minutes as needed for chest pain. 05/26/20 08/24/20  Tobb, Kardie, DO  simvastatin (ZOCOR) 20 MG tablet Take 1 tablet (20 mg total) by mouth at bedtime. 03/27/20   Ann Held, DO  tiZANidine (ZANAFLEX) 4 MG capsule Take 1 capsule (4 mg total) by mouth 3 (three) times daily as needed for muscle spasms. 04/24/20   Ann Held, DO   Vitamin D, Ergocalciferol, (DRISDOL) 1.25 MG (50000 UNIT) CAPS capsule TAKE 1 CAPSULE (50,000 UNITS TOTAL) BY MOUTH EVERY 7 (SEVEN) DAYS. 05/17/20   Ann Held, DO    Family History Family History  Problem Relation Age of Onset  . Arthritis Mother   . Arthritis Father   . Stroke Father   . Colon cancer Father 71  . Heart disease Father 61       chf  . Breast cancer Maternal Grandmother   . Asthma Maternal Grandmother   . Asthma Daughter   . Esophageal cancer Neg Hx   . Ulcerative colitis Neg Hx   . Stomach cancer Neg Hx     Social  History Social History   Tobacco Use  . Smoking status: Current Every Day Smoker    Packs/day: 0.50    Years: 55.00    Pack years: 27.50    Types: Cigarettes  . Smokeless tobacco: Never Used  Vaping Use  . Vaping Use: Never used  Substance Use Topics  . Alcohol use: Yes    Alcohol/week: 0.0 standard drinks    Comment: "sporatic"- wine, beer, mixed drink  . Drug use: No     Allergies   Patient has no known allergies.   Review of Systems Review of Systems + neuropathy, L face bruising, abrasion, L elbow and L knee contusion. The rest is neg   Physical Exam Triage Vital Signs ED Triage Vitals  Enc Vitals Group     BP 06/09/20 0952 131/79     Pulse Rate 06/09/20 0952 76     Resp 06/09/20 0952 16     Temp 06/09/20 0952 99 F (37.2 C)     Temp Source 06/09/20 0952 Oral     SpO2 06/09/20 0952 94 %     Weight 06/09/20 0949 210 lb (95.3 kg)     Height 06/09/20 0949 5\' 5"  (1.651 m)     Head Circumference --      Peak Flow --      Pain Score 06/09/20 0949 4     Pain Loc --      Pain Edu? --      Excl. in [place]? --    No data found.  Updated Vital Signs BP 131/79 (BP Location: Right Arm)   Pulse 76   Temp 99 F (37.2 C) (Oral)   Resp 16   Ht 5\' 5"  (1.651 m)   Wt 210 lb (95.3 kg)   SpO2 94%   BMI 34.95 kg/m   Visual Acuity Right Eye Distance:   Left Eye Distance:   Bilateral Distance:    Right Eye Near:    Left Eye Near:    Bilateral Near:     Physical Exam Vitals and nursing note reviewed.  Constitutional:      General: She is not in acute distress.    Appearance: She is obese. She is not toxic-appearing.  HENT:     Head: Atraumatic.     Right Ear: Tympanic membrane, ear canal and external ear normal.     Left Ear: Tympanic membrane, ear canal and external ear normal.     Nose: Nose normal.     Mouth/Throat:     Mouth: Mucous membranes are moist.     Comments: Teeth are intact. Does not have jaw pain.  Eyes:     General: No scleral icterus.    Extraocular Movements: Extraocular movements intact.     Conjunctiva/sclera: Conjunctivae normal.     Pupils: Pupils are equal, round, and reactive to light.     Comments: Has swelling and ecchymosis of L lower orbit with a couple of abrasion over zygomatic bone. Does not have bone tenderness of L orbit or face. Seems to be only soft tissue.   Pulmonary:     Effort: Pulmonary effort is normal.  Musculoskeletal:        General: Normal range of motion.     Cervical back: Neck supple. No tenderness.     Right lower leg: No edema.     Left lower leg: No edema.     Comments: BACK - no thoracic or lumbar back pain noted L ELBOW- with normal ROM, no swelling,  Has very superficial abrasion L KNEE- mild swelling and superficial abrasion, with ecchymosis noted. ROM is normal. Has mild pain with palpation   Skin:    General: Skin is warm and dry.     Findings: No rash.  Neurological:     Mental Status: She is alert and oriented to person, place, and time.     Motor: No weakness.     Gait: Gait normal.  Psychiatric:        Mood and Affect: Mood normal.        Behavior: Behavior normal.        Thought Content: Thought content normal.        Judgment: Judgment normal.     UC Treatments / Results  Labs (all labs ordered are listed, but only abnormal results are displayed) Labs Reviewed - No data to display  EKG   Radiology No results  found.  Procedures Procedures (including critical care time)  Medications Ordered in UC Medications - No data to display  Initial Impression / Assessment and Plan / UC Course  I have reviewed the triage vital signs and the nursing notes. Frequent falls and Neuropathy of feet. Still under work up with specialist. Has L face abrasion and contusion, L elbow and L knee contusion.  Talk to your PCP about having [facility]  Do a PT/OT do a fall evaluation See instructions  Final Clinical Impressions(s) / UC Diagnoses   Final diagnoses:  Contusion of face, initial encounter  Fall in home, initial encounter     Discharge Instructions     Your exam does not show you have bone pain on your face, therefore I dont believe you need any imaging. Clean the cuts with soap and water and apply the antibiotic ointment twice a day for 2 days If you develop dizziness, severe headache and vomiting, go to the ER right away Your Vit B12 was low last year, so I suggest you get on 1000 mcg sublingual a day and see if this helps your feet numbness Also try [place] chi exercises to help your balance, but have someone there with you when you start.     ED Prescriptions    Medication Sig Dispense Auth. Provider   mupirocin cream (BACTROBAN) 2 % Apply 1 application topically 2 (two) times daily. 30 g Rodriguez-Southworth, Sunday Spillers, PA-C     PDMP not reviewed this encounter.   Shelby Mattocks, PA-C 06/09/20 1611

## 2020-06-21 ENCOUNTER — Other Ambulatory Visit: Payer: Self-pay | Admitting: Family Medicine

## 2020-06-27 ENCOUNTER — Telehealth: Payer: Self-pay

## 2020-07-03 ENCOUNTER — Telehealth: Payer: Self-pay | Admitting: *Deleted

## 2020-07-03 DIAGNOSIS — Z006 Encounter for examination for normal comparison and control in clinical research program: Secondary | ICD-10-CM

## 2020-07-03 NOTE — Telephone Encounter (Signed)
I called patient for 90-day Cad-Fem study. Patient is doing well from cardiac symptoms. We discussed the importance of heart healthy diet and routine exercise program. We talked about controlling blood pressure and cholesterol management. I reminded patient I would call her in March for 1 year follow-up for study.

## 2020-07-05 ENCOUNTER — Encounter: Payer: Self-pay | Admitting: Family Medicine

## 2020-07-05 DIAGNOSIS — M62838 Other muscle spasm: Secondary | ICD-10-CM

## 2020-07-05 MED ORDER — TIZANIDINE HCL 4 MG PO CAPS
4.0000 mg | ORAL_CAPSULE | Freq: Three times a day (TID) | ORAL | 1 refills | Status: DC | PRN
Start: 2020-07-05 — End: 2020-08-08

## 2020-07-13 ENCOUNTER — Other Ambulatory Visit: Payer: Self-pay | Admitting: Family Medicine

## 2020-07-17 ENCOUNTER — Other Ambulatory Visit: Payer: Medicare HMO

## 2020-07-19 ENCOUNTER — Ambulatory Visit
Admission: RE | Admit: 2020-07-19 | Discharge: 2020-07-19 | Disposition: A | Discharge status: Home / Self Care | Payer: Medicare HMO | Source: Ambulatory Visit | Attending: Neurology | Admitting: Neurology

## 2020-07-19 DIAGNOSIS — R2681 Unsteadiness on feet: Secondary | ICD-10-CM

## 2020-07-19 DIAGNOSIS — M4802 Spinal stenosis, cervical region: Secondary | ICD-10-CM | POA: Diagnosis not present

## 2020-07-19 DIAGNOSIS — R296 Repeated falls: Secondary | ICD-10-CM

## 2020-07-19 IMAGING — MR MR CERVICAL SPINE W/O CM
5 series · 36 of 48 positions shown · non-contrast
Comparison: None.

CLINICAL DATA: Cervical spinal stenosis.  Recurrent falls.

EXAM:
MRI CERVICAL SPINE WITHOUT CONTRAST
TECHNIQUE: Multiplanar, multisequence MR imaging of the cervical spine was
performed. No intravenous contrast was administered.

[Series 2: T2 · sagittal · 3.0mm · 0.41mm/px · 7 of 17 slices shown (1 of 2)]
[im 1/17]
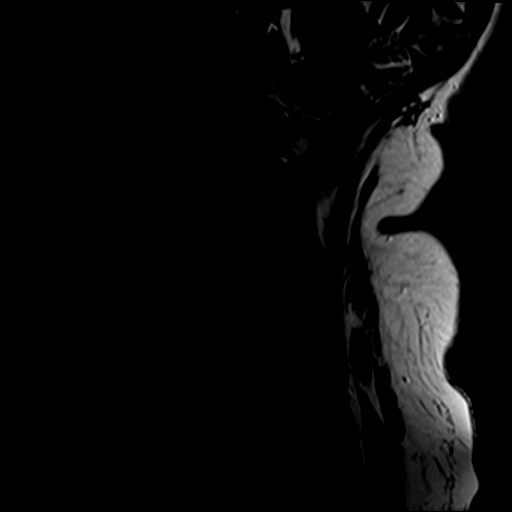
[im 3/17]
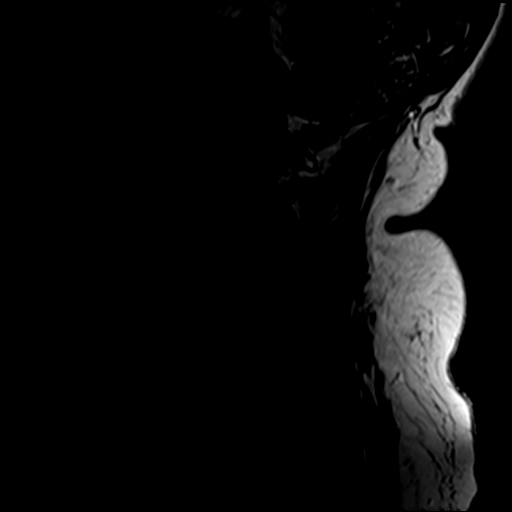
[im 6/17]
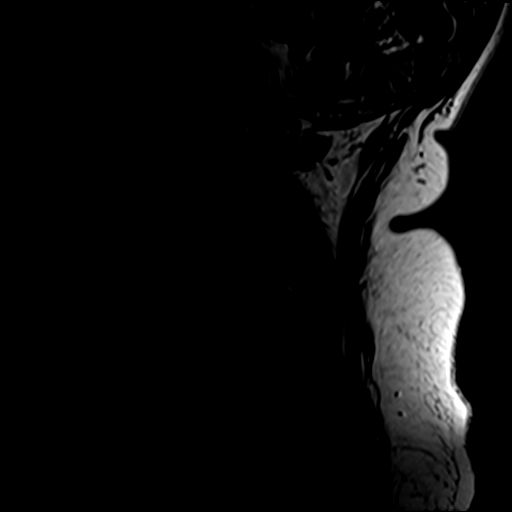
[im 9/17]
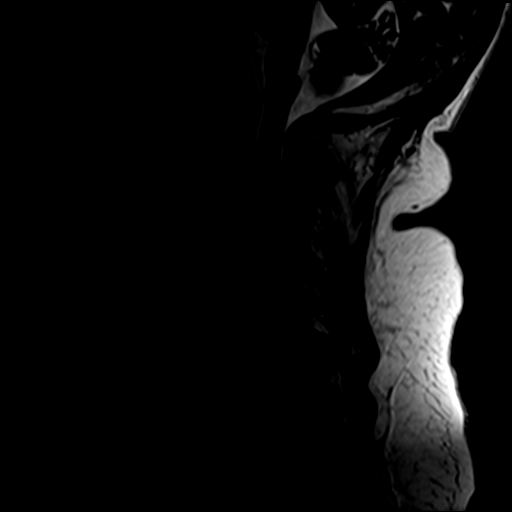
[im 11/17]
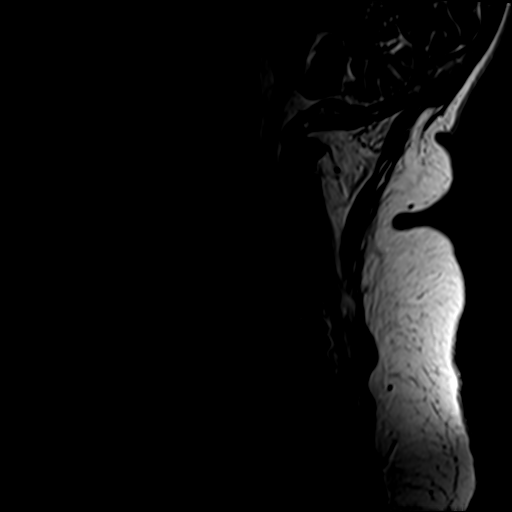
[im 14/17]
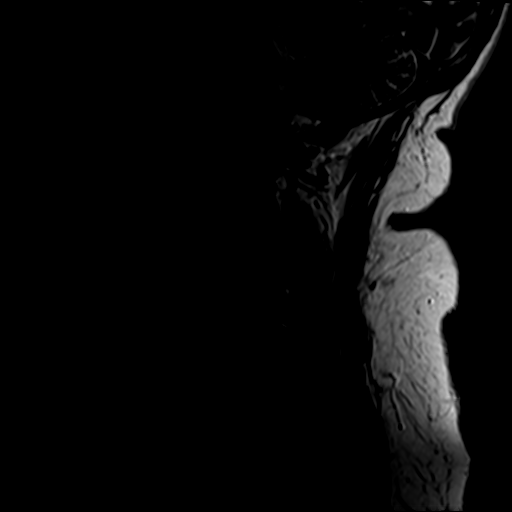
[im 17/17]
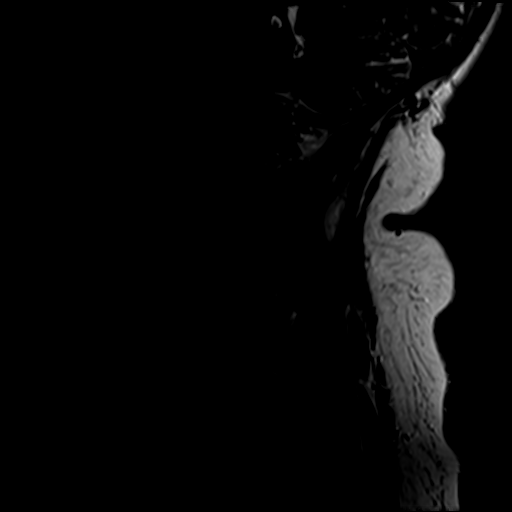

[Series 3: STIR · sagittal · 3.0mm · 0.82mm/px · 7 of 17 slices shown]
[im 1/17]
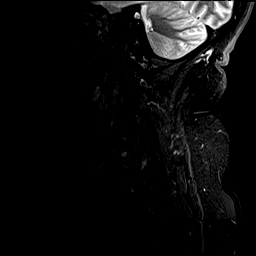
[im 3/17]
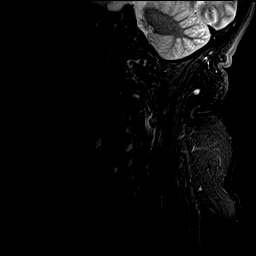
[im 6/17]
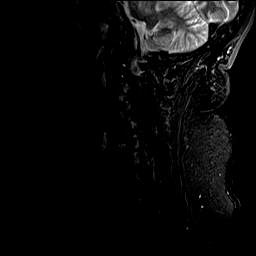
[im 9/17]
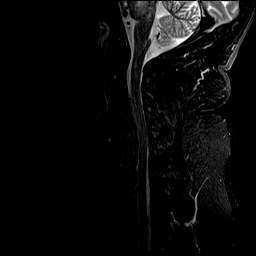
[im 11/17]
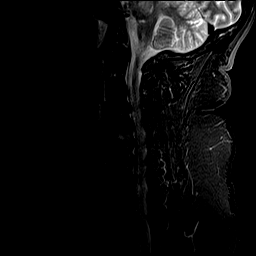
[im 14/17]
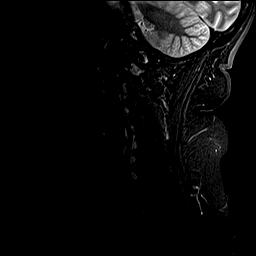
[im 17/17]
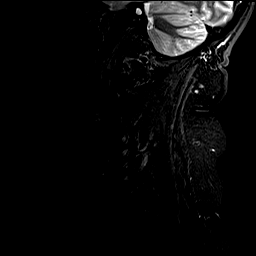

[Series 4: T1 · sagittal · 3.0mm · 0.82mm/px · 8 of 17 slices shown]
[im 1/17]
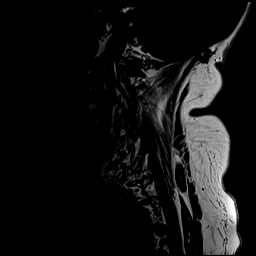
[im 3/17]
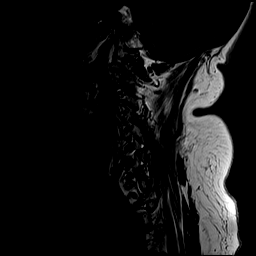
[im 5/17]
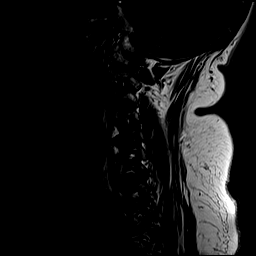
[im 7/17]
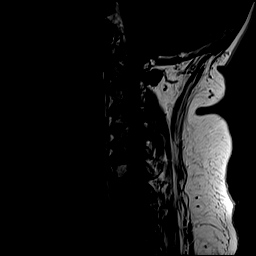
[im 10/17]
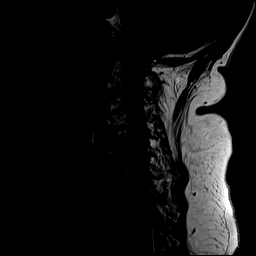
[im 12/17]
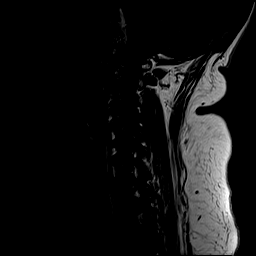
[im 14/17]
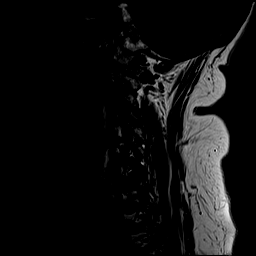
[im 17/17]
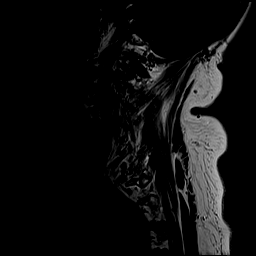

[Series 5: T2 · axial · 3.0mm · 0.70mm/px · z∈[-63,+36]mm · 9 of 28 slices shown (2 of 2)]
[im 1/28]
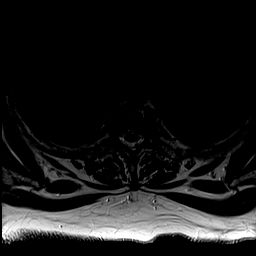
[im 5/28]
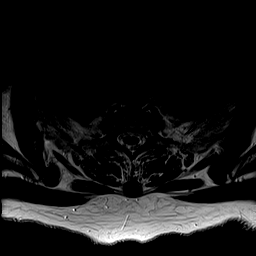
[im 10/28]
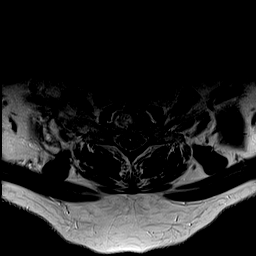
[im 12/28]
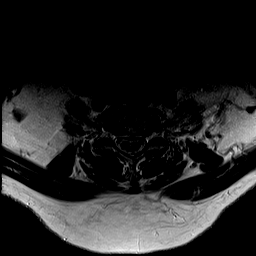
[im 14/28]
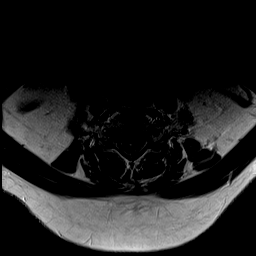
[im 16/28]
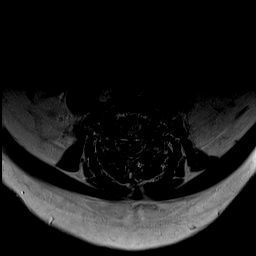
[im 19/28]
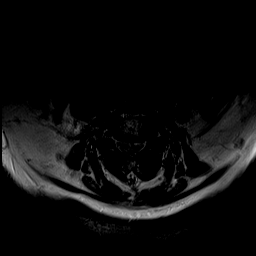
[im 23/28]
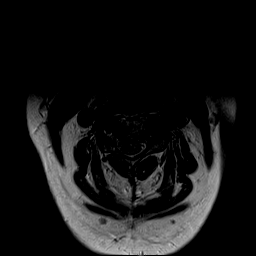
[im 28/28]
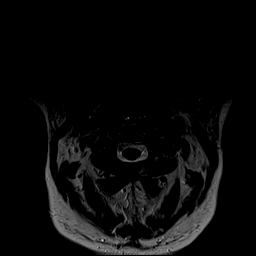

[Series 6: GRE · axial · 3.0mm · 0.35mm/px · z∈[-63,-8]mm · 5 of 28 slices shown]
[im 1/28]
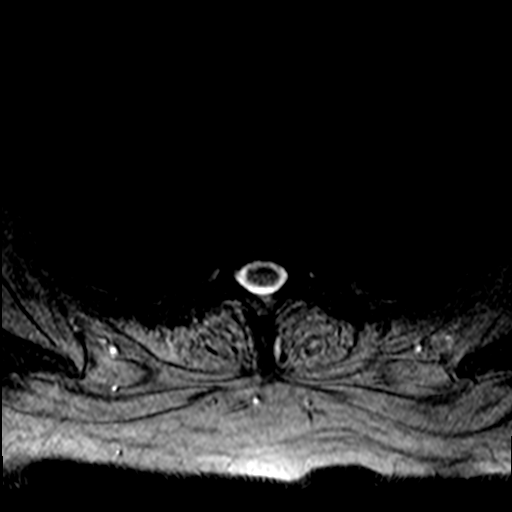
[im 5/28]
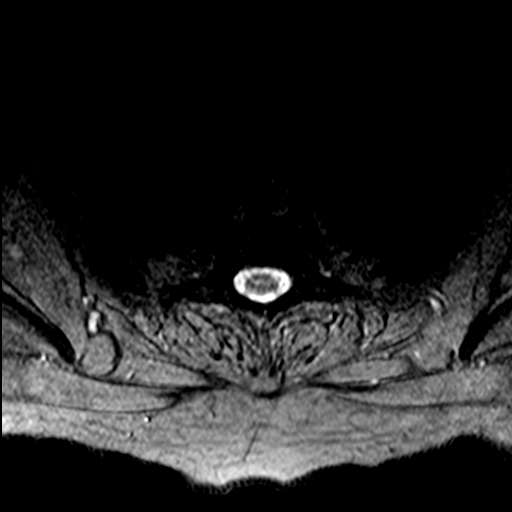
[im 10/28]
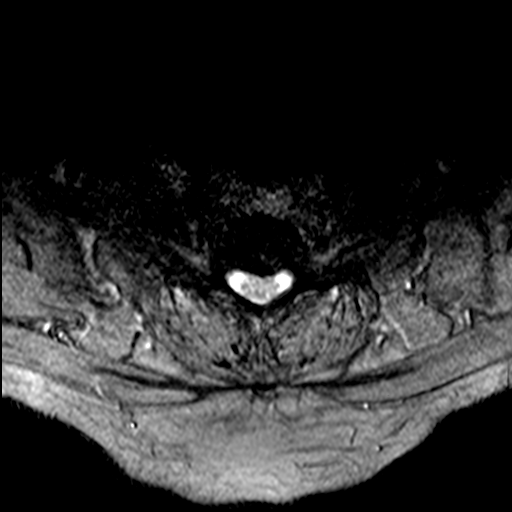
[im 12/28]
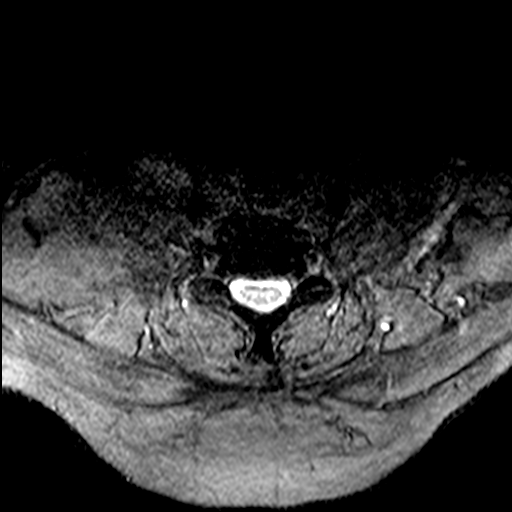
[im 16/28]
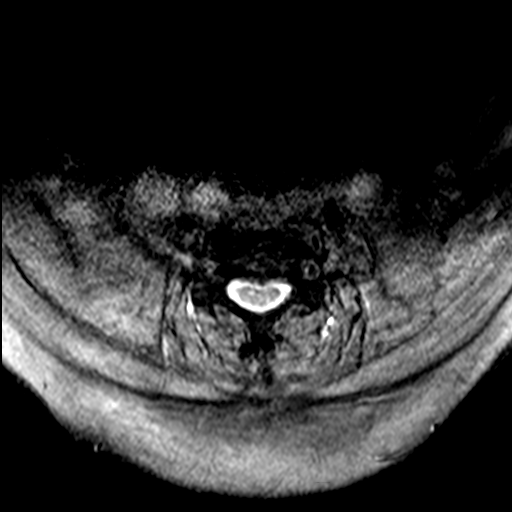

[36 of 48 positions shown; findings below may reference images not displayed]

FINDINGS: Alignment: Mild anterolisthesis C4-5 and mild retrolisthesis C5-6.
Straightening of the cervical lordosis

Vertebrae: Negative for fracture or mass

Cord: Cord evaluation limited by mild motion. No cord signal
abnormality is identified.

Posterior Fossa, vertebral arteries, paraspinal tissues: Negative

Disc levels:

C2-3: Negative

C3-4: Disc degeneration and uncinate spurring. Moderate left
foraminal narrowing.

C4-5: Disc degeneration and uncinate spurring. Mild left foraminal
narrowing. Mild cord flattening and mild spinal stenosis

C5-6: Disc degeneration with diffuse uncinate spurring. Mild spinal
stenosis and moderate foraminal stenosis bilaterally due to spurring

C6-7: Disc degeneration and diffuse uncinate spurring. Mild
foraminal narrowing bilaterally

C7-T1: Mild disc degeneration and spurring and small central disc
protrusion. Mild foraminal narrowing bilaterally.
IMPRESSION: Multilevel degenerative changes cervical spine with spinal and
foraminal stenosis at multiple levels. No cord compression or
fracture.

## 2020-07-23 ENCOUNTER — Other Ambulatory Visit: Payer: Self-pay | Admitting: Family Medicine

## 2020-07-24 NOTE — Progress Notes (Signed)
Pt advised of her mri results. Advised if she like to do the refer please give [place] call.

## 2020-08-07 ENCOUNTER — Encounter: Payer: Self-pay | Admitting: Family Medicine

## 2020-08-07 DIAGNOSIS — Z8616 Personal history of COVID-19: Secondary | ICD-10-CM | POA: Insufficient documentation

## 2020-08-07 HISTORY — DX: Personal history of COVID-19: Z86.16

## 2020-08-08 ENCOUNTER — Other Ambulatory Visit: Payer: Self-pay | Admitting: Family Medicine

## 2020-08-08 ENCOUNTER — Encounter: Payer: Self-pay | Admitting: Family Medicine

## 2020-08-08 ENCOUNTER — Other Ambulatory Visit: Payer: Self-pay

## 2020-08-08 ENCOUNTER — Ambulatory Visit (INDEPENDENT_AMBULATORY_CARE_PROVIDER_SITE_OTHER): Payer: Medicare HMO | Admitting: Family Medicine

## 2020-08-08 VITALS — BP 130/82 | HR 78 | Temp 98.6°F | Resp 20 | Ht 65.0 in | Wt 215.6 lb

## 2020-08-08 DIAGNOSIS — M62838 Other muscle spasm: Secondary | ICD-10-CM | POA: Diagnosis not present

## 2020-08-08 DIAGNOSIS — Z720 Tobacco use: Secondary | ICD-10-CM

## 2020-08-08 DIAGNOSIS — E785 Hyperlipidemia, unspecified: Secondary | ICD-10-CM

## 2020-08-08 DIAGNOSIS — J449 Chronic obstructive pulmonary disease, unspecified: Secondary | ICD-10-CM | POA: Diagnosis not present

## 2020-08-08 DIAGNOSIS — N760 Acute vaginitis: Secondary | ICD-10-CM

## 2020-08-08 DIAGNOSIS — I10 Essential (primary) hypertension: Secondary | ICD-10-CM | POA: Diagnosis not present

## 2020-08-08 HISTORY — DX: Hyperlipidemia, unspecified: E78.5

## 2020-08-08 LAB — LIPID PANEL
Cholesterol: 151 mg/dL (ref 0–200)
HDL: 51.5 mg/dL (ref >39.00)
LDL Cholesterol: 63 mg/dL (ref 0–99)
NonHDL: 99.45
Total CHOL/HDL Ratio: 3
Triglycerides: 181 mg/dL — ABNORMAL HIGH (ref 0.0–149.0)
VLDL: 36.2 mg/dL (ref 0.0–40.0)

## 2020-08-08 LAB — COMPREHENSIVE METABOLIC PANEL
ALT: 17 U/L (ref 0–35)
AST: 19 U/L (ref 0–37)
Albumin: 4.4 g/dL (ref 3.5–5.2)
Alkaline Phosphatase: 70 U/L (ref 39–117)
BUN: 20 mg/dL (ref 6–23)
CO2: 28 mEq/L (ref 19–32)
Calcium: 9.8 mg/dL (ref 8.4–10.5)
Chloride: 99 mEq/L (ref 96–112)
Creatinine, Ser: 0.75 mg/dL (ref 0.40–1.20)
GFR: 81.37 mL/min (ref >60.00)
Glucose, Bld: 95 mg/dL (ref 70–99)
Potassium: 4.7 mEq/L (ref 3.5–5.1)
Sodium: 136 mEq/L (ref 135–145)
Total Bilirubin: 0.5 mg/dL (ref 0.2–1.2)
Total Protein: 7.3 g/dL (ref 6.0–8.3)

## 2020-08-08 MED ORDER — FLUCONAZOLE 150 MG PO TABS: ORAL_TABLET | ORAL | 0 refills | Status: DC

## 2020-08-08 MED ORDER — BREZTRI AEROSPHERE 160-9-4.8 MCG/ACT IN AERO: 2.0000 | INHALATION_SPRAY | Freq: Two times a day (BID) | RESPIRATORY_TRACT | 11 refills | Status: DC

## 2020-08-08 MED ORDER — TIZANIDINE HCL 4 MG PO CAPS: 4.0000 mg | ORAL_CAPSULE | Freq: Three times a day (TID) | ORAL | 1 refills | Status: DC | PRN

## 2020-08-08 MED ORDER — SIMVASTATIN 20 MG PO TABS: 20.0000 mg | ORAL_TABLET | Freq: Every day | ORAL | 0 refills | Status: DC

## 2020-08-08 NOTE — Assessment & Plan Note (Signed)
Encourage heart healthy diet such as MIND or DASH diet, increase exercise, avoid trans fats, simple carbohydrates and processed foods, consider a krill or fish or flaxseed oil cap daily.  °

## 2020-08-08 NOTE — Progress Notes (Signed)
Subjective:   By signing my name below, I, Jasmine Mclaughlin, attest that this documentation has been prepared under the direction and in the presence of Dr. Roma Schanz, DO. 08/08/2020    Patient ID: Jasmine Mclaughlin, female    DOB: 08-31-1950, 70 y.o.   MRN: [id number]  Chief Complaint  Patient presents with   Hyperlipidemia   Follow-up    HPI Patient is in today for a office visit to f/u cholesterol.   She complains that her COPD symptoms have worsened since the temperature and humidity has increased. She is taking albuterol more frequently and is requesting to get daily medication to help manage her symptoms. She denies having cough. She is requesting a refill on 20 mg simvastatin daily PO.  She continues taking 20 mg citalopram daily PO and reports no new issues.    Past Medical History:  Diagnosis Date   Allergy    Anxiety    Arthritis    degenerative back, joint disturbance "everywhere "   Asthma    Chest tightness 10/27/2013   Jasmine polyps    COPD (chronic obstructive pulmonary disease) (HCC)    Generalized anxiety disorder 11/13/2012   Genital warts    Obesity (BMI 30-39.9) 10/14/2012   Osteoporosis    Pneumonia    Shortness of breath    pt./ reports that she is out of shape   Sinusitis, acute 10/27/2013   Tobacco abuse 09/28/2010   Wheezing 10/27/2013    Past Surgical History:  Procedure Laterality Date   ANTERIOR FUSION LUMBAR SPINE  05/06/2012   ANTERIOR LAT LUMBAR FUSION N/A 05/06/2012   Procedure: ANTERIOR LATERAL LUMBAR FUSION 3 LEVELS;  Surgeon: Sinclair Ship, MD;  Location: [place];  Service: Orthopedics;  Laterality: N/A;  Lateral interbody fusion, lumbar 2-3,lumbar 3-4, lumbar 4-5 with instrumentation and allograft.   COLONOSCOPY     COLONOSCOPY WITH PROPOFOL N/A 12/15/2017   Procedure: COLONOSCOPY WITH PROPOFOL;  Surgeon: Rush Landmark Telford Nab., MD;  Location: [place];  Service: Gastroenterology;  Laterality: N/A;   COLONOSCOPY WITH  PROPOFOL N/A 07/08/2018   Procedure: COLONOSCOPY WITH PROPOFOL;  Surgeon: Rush Landmark Telford Nab., MD;  Location: [place];  Service: Gastroenterology;  Laterality: N/A;   COLONOSCOPY WITH PROPOFOL N/A 09/01/2019   Procedure: COLONOSCOPY WITH PROPOFOL;  Surgeon: Rush Landmark Telford Nab., MD;  Location: [facility];  Service: Gastroenterology;  Laterality: N/A;   KNEE ARTHROSCOPY Right     R knee 01/2012, for ligament tears   POLYPECTOMY  07/08/2018   Procedure: POLYPECTOMY;  Surgeon: Mansouraty, Telford Nab., MD;  Location: [facility];  Service: Gastroenterology;;   POLYPECTOMY  09/01/2019   Procedure: POLYPECTOMY;  Surgeon: Irving Copas., MD;  Location: [facility];  Service: Gastroenterology;;   TONSILLECTOMY     VAGINAL DELIVERY     x1    Family History  Problem Relation Age of Onset   Arthritis Mother    Arthritis Father    Stroke Father    Jasmine cancer Father 62   Heart disease Father 27       chf   Breast cancer Maternal Grandmother    Asthma Maternal Grandmother    Asthma Daughter    Esophageal cancer Neg Hx    Ulcerative colitis Neg Hx    Stomach cancer Neg Hx     Social History   Socioeconomic History   Marital status: Single    Spouse name: Not on file   Number of children: 1   Years of education: Not on  file   Highest education level: Not on file  Occupational History   Occupation: [employer]    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  Tobacco Use   Smoking status: Every Day    Packs/day: 0.50    Years: 55.00    Pack years: 27.50    Types: Cigarettes   Smokeless tobacco: Never  Vaping Use   Vaping Use: Never used  Substance and Sexual Activity   Alcohol use: Yes    Alcohol/week: 0.0 standard drinks    Comment: "sporatic"- wine, beer, mixed drink   Drug use: No   Sexual activity: Not Currently    Partners: Male  Other Topics Concern   Not on file  Social History Narrative   1 caffeine drinks daily   No exercise   Left handed   One  story home   Social Determinants of Health   Financial Resource Strain: Low Risk    Difficulty of Paying Living Expenses: Not hard at all  Food Insecurity: No Food Insecurity   Worried About Charity fundraiser in the Last Year: Never true   Ran Out of Food in the Last Year: Never true  Transportation Needs: No Transportation Needs   Lack of Transportation (Medical): No   Lack of Transportation (Non-Medical): No  Physical Activity: Not on file  Stress: Not on file  Social Connections: Not on file  Intimate Partner Violence: Not on file    Outpatient Medications Prior to Visit  Medication Sig Dispense Refill   acetaminophen (TYLENOL) 500 MG tablet Take 1,000 mg by mouth 2 (two) times daily.     albuterol (VENTOLIN HFA) 108 (90 Base) MCG/ACT inhaler Inhale 2 puffs into the lungs every 6 (six) hours as needed for wheezing or shortness of breath. 1 each 5   alendronate (FOSAMAX) 70 MG tablet Take 1 tablet (70 mg total) by mouth once a week. TAKE WITH A FULL GLASS OF WATER ON AN EMPTY STOMACH. 12 tablet 3   chlorpheniramine (CHLOR-TRIMETON) 4 MG tablet Take 4 mg by mouth daily as needed for allergies.     citalopram (CELEXA) 20 MG tablet Take 1 tablet (20 mg total) by mouth daily. 90 tablet 1   diphenhydrAMINE (SOMINEX) 25 MG tablet Take 25 mg by mouth at bedtime.     fluticasone (FLONASE) 50 MCG/ACT nasal spray Place 2 sprays into both nostrils daily. 16 g 6   gabapentin (NEURONTIN) 100 MG capsule Take 100 mg in the AM 30 capsule 3   gabapentin (NEURONTIN) 300 MG capsule Take 1 capsule (300 mg total) by mouth at bedtime. 30 capsule 5   nitroGLYCERIN (NITROSTAT) 0.4 MG SL tablet Place 1 tablet (0.4 mg total) under the tongue every 5 (five) minutes as needed for chest pain. 90 tablet 3   Vitamin D, Ergocalciferol, (DRISDOL) 1.25 MG (50000 UNIT) CAPS capsule TAKE 1 CAPSULE (50,000 UNITS TOTAL) BY MOUTH EVERY 7 (SEVEN) DAYS. 12 capsule 1   simvastatin (ZOCOR) 20 MG tablet TAKE 1 TABLET (20 MG  TOTAL) BY MOUTH AT BEDTIME. PT NEEDS OFFICE VISIT FOR MORE REFILLS 15 tablet 0   tiZANidine (ZANAFLEX) 4 MG capsule Take 1 capsule (4 mg total) by mouth 3 (three) times daily as needed for muscle spasms. 30 capsule 1   hydrochlorothiazide (MICROZIDE) 12.5 MG capsule Take 1 capsule (12.5 mg total) by mouth daily. 90 capsule 3   dicyclomine (BENTYL) 10 MG capsule Take 1 capsule (10 mg total) by mouth 4 (four) times daily -  before  meals and at bedtime. (Patient not taking: Reported on 08/08/2020) 20 capsule 1   mupirocin cream (BACTROBAN) 2 % Apply 1 application topically 2 (two) times daily. (Patient not taking: Reported on 08/08/2020) 30 g 0   No facility-administered medications prior to visit.    No Known Allergies  Review of Systems  Constitutional:  Negative for fever and malaise/fatigue.  HENT:  Negative for congestion.   Eyes:  Negative for blurred vision.  Respiratory:  Positive for cough and wheezing. Negative for sputum production and shortness of breath.        (+)difficulty breathing  Cardiovascular:  Negative for chest pain, palpitations and leg swelling.  Gastrointestinal:  Negative for vomiting.  Musculoskeletal:  Negative for back pain.  Skin:  Negative for rash.  Neurological:  Negative for loss of consciousness and headaches.      Objective:    Physical Exam Vitals and nursing note reviewed.  Constitutional:      General: She is not in acute distress.    Appearance: Normal appearance. She is not ill-appearing.  HENT:     Head: Normocephalic and atraumatic.     Right Ear: External ear normal.     Left Ear: External ear normal.  Eyes:     Extraocular Movements: Extraocular movements intact.     Pupils: Pupils are equal, round, and reactive to light.  Cardiovascular:     Rate and Rhythm: Normal rate and regular rhythm.     Heart sounds: Normal heart sounds. No murmur heard.   No gallop.  Pulmonary:     Effort: Pulmonary effort is normal. No respiratory distress.      Breath sounds: Examination of the right-upper field reveals wheezing. Examination of the left-upper field reveals wheezing. Examination of the right-middle field reveals wheezing. Examination of the left-middle field reveals wheezing. Examination of the right-lower field reveals wheezing. Examination of the left-lower field reveals wheezing. Wheezing (wheezing all over on expiration) present. No rales.  Skin:    General: Skin is warm and dry.  Neurological:     Mental Status: She is alert and oriented to person, place, and time.  Psychiatric:        Behavior: Behavior normal.        Judgment: Judgment normal.    BP 130/82 (BP Location: Right Arm, Patient Position: Sitting, Cuff Size: Large)   Pulse 78   Temp 98.6 F (37 C) (Oral)   Resp 20   Ht 5' 5" (1.651 m)   Wt 215 lb 9.6 oz (97.8 kg)   SpO2 96%   BMI 35.88 kg/m  Wt Readings from Last 3 Encounters:  08/08/20 215 lb 9.6 oz (97.8 kg)  06/09/20 210 lb (95.3 kg)  05/26/20 211 lb (95.7 kg)    Diabetic Foot Exam - Simple   No data filed    Lab Results  Component Value Date   WBC 8.4 12/01/2019   HGB 13.8 12/01/2019   HCT 40.8 12/01/2019   PLT 335.0 12/01/2019   GLUCOSE 145 (H) 03/13/2020   CHOL 160 03/07/2020   TRIG 153.0 (H) 03/07/2020   HDL 52.40 03/07/2020   LDLDIRECT 135.0 12/23/2019   LDLCALC 77 03/07/2020   ALT 11 12/23/2019   AST 12 12/23/2019   NA 138 03/13/2020   K 4.5 03/13/2020   CL 97 03/13/2020   CREATININE 0.83 03/13/2020   BUN 20 03/13/2020   CO2 24 03/13/2020   TSH 0.95 09/23/2019   INR 1.0 11/14/2017    Lab Results  Component Value Date   TSH 0.95 09/23/2019   Lab Results  Component Value Date   WBC 8.4 12/01/2019   HGB 13.8 12/01/2019   HCT 40.8 12/01/2019   MCV 99.3 12/01/2019   PLT 335.0 12/01/2019   Lab Results  Component Value Date   NA 138 03/13/2020   K 4.5 03/13/2020   CO2 24 03/13/2020   GLUCOSE 145 (H) 03/13/2020   BUN 20 03/13/2020   CREATININE 0.83 03/13/2020    BILITOT 0.4 12/23/2019   ALKPHOS 62 12/23/2019   AST 12 12/23/2019   ALT 11 12/23/2019   PROT 7.1 12/23/2019   ALBUMIN 4.3 12/23/2019   CALCIUM 10.2 03/13/2020   EGFR 77 03/13/2020   GFR 77.97 12/23/2019   Lab Results  Component Value Date   CHOL 160 03/07/2020   Lab Results  Component Value Date   HDL 52.40 03/07/2020   Lab Results  Component Value Date   LDLCALC 77 03/07/2020   Lab Results  Component Value Date   TRIG 153.0 (H) 03/07/2020   Lab Results  Component Value Date   CHOLHDL 3 03/07/2020   No results found for: HGBA1C     Assessment & Plan:   Problem List Items Addressed This Visit       Unprioritized   Muscle spasm   Relevant Medications   tiZANidine (ZANAFLEX) 4 MG capsule   COPD (chronic obstructive pulmonary disease) (HCC)    breztri added  con't prn albuterol F/u prn       Relevant Medications   Budeson-Glycopyrrol-Formoterol (BREZTRI AEROSPHERE) 160-9-4.8 MCG/ACT AERO   Hyperlipidemia - Primary    Encourage heart healthy diet such as MIND or DASH diet, increase exercise, avoid trans fats, simple carbohydrates and processed foods, consider a krill or fish or flaxseed oil cap daily.        Relevant Medications   simvastatin (ZOCOR) 20 MG tablet   Other Relevant Orders   Lipid panel   Comprehensive metabolic panel   Primary hypertension    Well controlled, no changes to meds. Encouraged heart healthy diet such as the DASH diet and exercise as tolerated.        Relevant Medications   simvastatin (ZOCOR) 20 MG tablet   Tobacco use    Pt still smoking daily         Meds ordered this encounter  Medications   Budeson-Glycopyrrol-Formoterol (BREZTRI AEROSPHERE) 160-9-4.8 MCG/ACT AERO    Sig: Inhale 2 puffs into the lungs 2 (two) times daily.    Dispense:  10.7 g    Refill:  11   simvastatin (ZOCOR) 20 MG tablet    Sig: Take 1 tablet (20 mg total) by mouth at bedtime. Pt needs office visit for more refills    Dispense:  15  tablet    Refill:  0    NEEDS OFFICE VISIT BEFORE ANY FURTHER REFILLS   tiZANidine (ZANAFLEX) 4 MG capsule    Sig: Take 1 capsule (4 mg total) by mouth 3 (three) times daily as needed for muscle spasms.    Dispense:  30 capsule    Refill:  1    I, Dr. Roma Schanz, DO, personally preformed the services described in this documentation.  All medical record entries made by the scribe were at my direction and in my presence.  I have reviewed the chart and discharge instructions (if applicable) and agree that the record reflects my personal performance and is accurate and complete. 08/08/2020   I,Jasmine Mclaughlin,acting as a scribe  for Ann Held, DO.,have documented all relevant documentation on the behalf of Ann Held, DO,as directed by  Ann Held, DO while in the presence of Ann Held, DO.   Ann Held, DO

## 2020-08-08 NOTE — Patient Instructions (Signed)

## 2020-08-08 NOTE — Assessment & Plan Note (Signed)
Pt still smoking daily

## 2020-08-08 NOTE — Assessment & Plan Note (Signed)
Well controlled, no changes to meds. Encouraged heart healthy diet such as the DASH diet and exercise as tolerated.  °

## 2020-08-08 NOTE — Assessment & Plan Note (Signed)
breztri added  con't prn albuterol F/u prn

## 2020-08-09 MED ORDER — SIMVASTATIN 20 MG PO TABS: 20.0000 mg | ORAL_TABLET | Freq: Every day | ORAL | 1 refills | Status: DC

## 2020-08-09 NOTE — Addendum Note (Signed)
Addended by: Roma Schanz R on: 08/09/2020 10:22 AM   Modules accepted: Orders

## 2020-08-29 DIAGNOSIS — J90 Pleural effusion, not elsewhere classified: Secondary | ICD-10-CM | POA: Diagnosis not present

## 2020-08-29 DIAGNOSIS — J209 Acute bronchitis, unspecified: Secondary | ICD-10-CM | POA: Diagnosis not present

## 2020-08-29 DIAGNOSIS — J9811 Atelectasis: Secondary | ICD-10-CM | POA: Diagnosis not present

## 2020-08-30 ENCOUNTER — Encounter: Payer: Self-pay | Admitting: Family Medicine

## 2020-08-30 ENCOUNTER — Telehealth: Payer: Self-pay

## 2020-08-30 NOTE — Telephone Encounter (Signed)
Nurse Assessment Nurse: Thad Ranger RN, Denise Date/Time (Eastern Time): 08/29/2020 8:36:21 AM Confirm and document reason for call. If symptomatic, describe symptoms. ---Pt w/cough, congestion and fever of 102. Has not tested for Covid. Does the patient have any new or worsening symptoms? ---Yes Will a triage be completed? ---Yes Related visit to physician within the last 2 weeks? ---No Does the PT have any chronic conditions? (i.e. diabetes, asthma, this includes High risk factors for pregnancy, etc.) ---Yes List chronic conditions. ---COPD, Asthma, CAD Is this a behavioral health or substance abuse call? ---No Guidelines Guideline Title Affirmed Question Affirmed Notes Nurse Date/Time (Eastern Time) Breathing Difficulty [1] MODERATE difficulty breathing (e.g., speaks in phrases, SOB even at rest, pulse 100-120) AND [2] NEW-onset or WORSE than normal Carmon, RN, Langley Gauss 08/29/2020 8:38:02 AM PLEASE NOTE: All timestamps contained within this report are represented as [place] Standard Time. CONFIDENTIALTY NOTICE: This fax transmission is intended only for the addressee. It contains information that is legally privileged, confidential or otherwise protected from use or disclosure. If you are not the intended recipient, you are strictly prohibited from reviewing, disclosing, copying using or disseminating any of this information or taking any action in reliance on or regarding this information. If you have received this fax in error, please notify us immediately by telephone so that we can arrange for its return to [place]. Phone: [phone], Toll-Free: [phone], Fax: [phone] Page: 2 of 2 Call Id: [id number] [facility] [employer]. Time Eilene Ghazi Time) Disposition Final User 08/29/2020 8:17:43 AM Send To RN Personal Dew, RN, Levada Dy 08/29/2020 8:40:59 AM Go to ED Now Yes Thad Ranger, RN, Yevette Edwards Disagree/Comply Comply Caller Understands Yes PreDisposition Call Doctor Care Advice Given Per  Guideline GO TO ED NOW: NOTE TO TRIAGER - DRIVING: * Another adult should drive. BRING MEDICINES: * Bring a list of your current medicines when you go to the Emergency Department (ER). CARE ADVICE given per Breathing Difficulty (Adult) guideline. CALL EMS 911 IF: * Call EMS if you become worse. Referrals GO TO FACILITY OTHER - SPECIFY

## 2020-08-30 NOTE — Telephone Encounter (Signed)
Spoke with patient. Pt states feeling better and her fever broke. Pt states testing negative for COVID. Pt advised to call back if she should like schedule a visit.

## 2020-08-31 ENCOUNTER — Encounter: Payer: Self-pay | Admitting: Family Medicine

## 2020-08-31 ENCOUNTER — Telehealth (INDEPENDENT_AMBULATORY_CARE_PROVIDER_SITE_OTHER): Payer: Medicare HMO | Admitting: Family Medicine

## 2020-08-31 VITALS — Temp 100.1°F

## 2020-08-31 DIAGNOSIS — U071 COVID-19: Secondary | ICD-10-CM

## 2020-08-31 MED ORDER — BENZONATATE 200 MG PO CAPS: 200.0000 mg | ORAL_CAPSULE | Freq: Two times a day (BID) | ORAL | 0 refills | Status: DC | PRN

## 2020-08-31 MED ORDER — NIRMATRELVIR/RITONAVIR (PAXLOVID)TABLET: 3.0000 | ORAL_TABLET | Freq: Two times a day (BID) | ORAL | 0 refills | Status: AC

## 2020-08-31 NOTE — Patient Instructions (Addendum)
HOME CARE TIPS:  -I sent the medication(s) we discussed to your pharmacy: Meds ordered this encounter  Medications   nirmatrelvir/ritonavir EUA (PAXLOVID) 20 x 150 MG & 10 x 100MG TABS    Sig: Take 3 tablets by mouth 2 (two) times daily for 5 days. (Take nirmatrelvir 150 mg two tablets twice daily for 5 days and ritonavir 100 mg one tablet twice daily for 5 days) Patient GFR is 81    Dispense:  30 tablet    Refill:  0   benzonatate (TESSALON) 200 MG capsule    Sig: Take 1 capsule (200 mg total) by mouth 2 (two) times daily as needed for cough.    Dispense:  20 capsule    Refill:  0     -I sent in the [facility] treatment or referral you requested per our discussion. Please see the information provided below and discuss further with the pharmacist/treatment team.  -If taking Paxlovid, please review all medications, supplement and over the counter drugs with your pharmacist and ask them to check for any interactions. Please make the following changes to your regular medications while taking Paxlovid: -please STOP your cholesterol medication (simvastatin/Zocor) while taking the Paxlovid. Restart 5 days after finishing the Paxlovid.   -If taking Paxlovid, there is a chance of rebound illness after finishing your treatment. If you become sick again please isolate for an additional 5 days.    -can use tylenol or aleve if needed for fevers, aches and pains per instructions  -can use nasal saline a few times per day if you have nasal congestion  -stay hydrated, drink plenty of fluids and eat small healthy meals - avoid dairy  -If the Covid test is positive, check out the [facility] website for more information on home care, transmission and treatment for COVID19  -follow up with your doctor in 2-3 days unless improving and feeling better  -stay home while sick, except to seek medical care. If you have COVID19, ideally it would be best to stay home for a full 10 days since the onset of symptoms PLUS  one day of no fever and feeling better. Wear a good mask that fits snugly (such as N95 or KN95) if around others to reduce the risk of transmission.  It was nice to meet you today, and I really hope you are feeling better soon. I help [facility] out with telemedicine visits on Tuesdays and Thursdays and am available for visits on those days. If you have any concerns or questions following this visit please schedule a follow up visit with your Primary Care doctor or seek care at a local urgent care clinic to avoid delays in care.    Seek in person care or schedule a follow up video visit promptly if your symptoms worsen, new concerns arise or you are not improving with treatment. Call 911 and/or seek emergency care if your symptoms are severe or life threatening.  FACT SHEET FOR PATIENTS, PARENTS, AND CAREGIVERS EMERGENCY USE AUTHORIZATION (EUA) OF PAXLOVID FOR CORONAVIRUS DISEASE 2019 (COVID-19) You are being given this Fact Sheet because your healthcare provider believes it is necessary to provide you with PAXLOVID for the treatment of mild-to-moderate coronavirus disease (COVID-19) caused by the SARS-CoV-2 virus. This Fact Sheet contains information to help you understand the risks and benefits of taking the PAXLOVID you have received or may receive. The U.S. Food and Drug Administration (FDA) has issued an Emergency Use Authorization (EUA) to make PAXLOVID available during the COVID-19 pandemic (for more  details about an EUA please see "What is an Emergency Use Authorization?" at the end of this document). PAXLOVID is not an FDA-approved medicine in the [place]. Read this Fact Sheet for information about PAXLOVID. Talk to your healthcare provider about your options or if you have any questions. It is your choice to take PAXLOVID.  What is COVID-19? COVID-19 is caused by a virus called a coronavirus. You can get COVID-19 through close contact with another person who has the  virus. COVID-19 illnesses have ranged from very mild-to-severe, including illness resulting in death. While information so far suggests that most COVID-19 illness is mild, serious illness can happen and may cause some of your other medical conditions to become worse. Older people and people of all ages with severe, long lasting (chronic) medical conditions like heart disease, lung disease, and diabetes, for example seem to be at higher risk of being hospitalized for COVID-19.  What is PAXLOVID? PAXLOVID is an investigational medicine used to treat mild-to-moderate COVID-19 in adults and children [76 years of age and older weighing at least 22 pounds (51 kg)] with positive results of direct SARS-CoV-2 viral testing, and who are at high risk for progression to severe COVID-19, including hospitalization or death. PAXLOVID is investigational because it is still being studied. There is limited information about the safety and effectiveness of using PAXLOVID to treat people with mild-to-moderate COVID-19.  The FDA has authorized the emergency use of PAXLOVID for the treatment of mild-tomoderate COVID-19 in adults and children [63 years of age and older weighing at least 58 pounds (44 kg)] with a positive test for the virus that causes COVID-19, and who are at high risk for progression to severe COVID-19, including hospitalization or death, under an EUA. 1 Revised: 24 March 2020   What should I tell my healthcare provider before I take PAXLOVID? Tell your healthcare provider if you: ? Have any allergies ? Have liver or kidney disease ? Are pregnant or plan to become pregnant ? Are breastfeeding a child ? Have any serious illnesses  Tell your healthcare provider about all the medicines you take, including prescription and over-the-counter medicines, vitamins, and herbal supplements. Some medicines may interact with PAXLOVID and may cause serious side effects. Keep a list of your medicines  to show your healthcare provider and pharmacist when you get a new medicine.  You can ask your healthcare provider or pharmacist for a list of medicines that interact with PAXLOVID. Do not start taking a new medicine without telling your healthcare provider. Your healthcare provider can tell you if it is safe to take PAXLOVID with other medicines.  Tell your healthcare provider if you are taking combined hormonal contraceptive. PAXLOVID may affect how your birth control pills work. Females who are able to become pregnant should use another effective alternative form of contraception or an additional barrier method of contraception. Talk to your healthcare provider if you have any questions about contraceptive methods that might be right for you.  How do I take PAXLOVID? ? PAXLOVID consists of 2 medicines: nirmatrelvir and ritonavir. o Take 2 pink tablets of nirmatrelvir with 1 white tablet of ritonavir by mouth 2 times each day (in the morning and in the evening) for 5 days. For each dose, take all 3 tablets at the same time. o If you have kidney disease, talk to your healthcare provider. You may need a different dose. ? Swallow the tablets whole. Do not chew, break, or crush the tablets. ? Take PAXLOVID  with or without food. ? Do not stop taking PAXLOVID without talking to your healthcare provider, even if you feel better. ? If you miss a dose of PAXLOVID within 8 hours of the time it is usually taken, take it as soon as you remember. If you miss a dose by more than 8 hours, skip the missed dose and take the next dose at your regular time. Do not take 2 doses of PAXLOVID at the same time. ? If you take too much PAXLOVID, call your healthcare provider or go to the nearest hospital emergency room right away. ? If you are taking a ritonavir- or cobicistat-containing medicine to treat hepatitis C or Human Immunodeficiency Virus (HIV), you should continue to take your medicine as  prescribed by your healthcare provider. 2 Revised: 24 March 2020    Talk to your healthcare provider if you do not feel better or if you feel worse after 5 days.  Who should generally not take PAXLOVID? Do not take PAXLOVID if: ? You are allergic to nirmatrelvir, ritonavir, or any of the ingredients in PAXLOVID. ? You are taking any of the following medicines: o Alfuzosin o Pethidine, propoxyphene o Ranolazine o Amiodarone, dronedarone, flecainide, propafenone, quinidine o Colchicine o Lurasidone, pimozide, clozapine o Dihydroergotamine, ergotamine, methylergonovine o Lovastatin, simvastatin o Sildenafil (Revatio) for pulmonary arterial hypertension (PAH) o Triazolam, oral midazolam o Apalutamide o Carbamazepine, phenobarbital, phenytoin o Rifampin o St. John's Wort (hypericum perforatum) Taking PAXLOVID with these medicines may cause serious or life-threatening side effects or affect how PAXLOVID works.  These are not the only medicines that may cause serious side effects if taken with PAXLOVID. PAXLOVID may increase or decrease the levels of multiple other medicines. It is very important to tell your healthcare provider about all of the medicines you are taking because additional laboratory tests or changes in the dose of your other medicines may be necessary while you are taking PAXLOVID. Your healthcare provider may also tell you about specific symptoms to watch out for that may indicate that you need to stop or decrease the dose of some of your other medicines.  What are the important possible side effects of PAXLOVID? Possible side effects of PAXLOVID are: ? Allergic Reactions. Allergic reactions can happen in people taking PAXLOVID, even after only 1 dose. Stop taking PAXLOVID and call your healthcare provider right away if you get any of the following symptoms of an allergic reaction: o hives o trouble swallowing or breathing o swelling of the mouth, lips, or  face o throat tightness o hoarseness 3 Revised: 24 March 2020  o skin rash ? Liver Problems. Tell your healthcare provider right away if you have any of these signs and symptoms of liver problems: loss of appetite, yellowing of your skin and the whites of eyes (jaundice), dark-colored urine, pale colored stools and itchy skin, stomach area (abdominal) pain. ? Resistance to HIV Medicines. If you have untreated HIV infection, PAXLOVID may lead to some HIV medicines not working as well in the future. ? Other possible side effects include: o altered sense of taste o diarrhea o high blood pressure o muscle aches These are not all the possible side effects of PAXLOVID. Not many people have taken PAXLOVID. Serious and unexpected side effects may happen. PAXLOVID is still being studied, so it is possible that all of the risks are not known at this time.  What other treatment choices are there? Veklury (remdesivir) is FDA-approved for the treatment of mild-to-moderate ZOXWR-60 in certain  adults and children. Talk with your doctor to see if Marijean Heath is appropriate for you. Like PAXLOVID, FDA may also allow for the emergency use of other medicines to treat people with COVID-19. Go to https://price.info/ for information on the emergency use of other medicines that are authorized by FDA to treat people with COVID-19. Your healthcare provider may talk with you about clinical trials for which you may be eligible. It is your choice to be treated or not to be treated with PAXLOVID. Should you decide not to receive it or for your child not to receive it, it will not change your standard medical care.  What if I am pregnant or breastfeeding? There is no experience treating pregnant women or breastfeeding mothers with PAXLOVID. For a mother and unborn baby, the benefit of taking PAXLOVID may  be greater than the risk from the treatment. If you are pregnant, discuss your options and specific situation with your healthcare provider. It is recommended that you use effective barrier contraception or do not have sexual activity while taking PAXLOVID. If you are breastfeeding, discuss your options and specific situation with your healthcare provider. 4 Revised: 24 March 2020   How do I report side effects with PAXLOVID? Contact your healthcare provider if you have any side effects that bother you or do not go away. Report side effects to FDA MedWatch at SmoothHits.hu or call 1-800-FDA1088 or you can report side effects to [employer]. at the contact information provided below. Website Fax number Telephone number www.pfizersafetyreporting.com [phone] How should I store [place]? Store PAXLOVID tablets at room temperature, between 68?F to 77?F (20?C to 25?C). How can I learn more about COVID-19? ? Ask your healthcare provider. ? Visit https://jacobson-johnson.com/. ? Contact your local or state public health department. What is an Emergency Use Authorization (EUA)? The [place] FDA has made PAXLOVID available under an emergency access mechanism called an Emergency Use Authorization (EUA). The EUA is supported by a Education officer, museum and Human Service (HHS) declaration that circumstances exist to justify the emergency use of drugs and biological products during the COVID-19 pandemic. PAXLOVID for the treatment of mild-to-moderate COVID-19 in adults and children [81 years of age and older weighing at least 92 pounds (61 kg)] with positive results of direct SARS-CoV-2 viral testing, and who are at high risk for progression to severe COVID-19, including hospitalization or death, has not undergone the same type of review as an FDA-approved product. In issuing an EUA under the NOMVE-72 public health emergency, the FDA has determined, among other things,  that based on the total amount of scientific evidence available including data from adequate and well-controlled clinical trials, if available, it is reasonable to believe that the product may be effective for diagnosing, treating, or preventing COVID-19, or a serious or life-threatening disease or condition caused by COVID-19; that the known and potential benefits of the product, when used to diagnose, treat, or prevent such disease or condition, outweigh the known and potential risks of such product; and that there are no adequate, approved, and available alternatives. All of these criteria must be met to allow for the product to be used in the treatment of patients during the COVID-19 pandemic. The EUA for PAXLOVID is in effect for the duration of the COVID-19 declaration justifying emergency use of this product, unless terminated or revoked (after which the products may no longer be used under the EUA). 5 Revised: 24 March 2020     Additional Information For general questions, visit  the website or call the telephone number provided below. Website Telephone number www.COVID19oralRx.com [phone] (1-877-C19-PACK) You can also go to www.pfizermedinfo.com or call [phone] for more information. [id number] Revised: 24 March 2020

## 2020-08-31 NOTE — Telephone Encounter (Signed)
Patient scheduled for VV with Dr. Maudie Mercury today (08/31/2020).

## 2020-08-31 NOTE — Progress Notes (Signed)
Virtual Visit via Video Note  I connected with Jasmine Mclaughlin  on 08/31/20 at 11:00 AM EDT by a video enabled telemedicine application and verified that I am speaking with the correct person using two identifiers.  Location patient: home, [place] Location provider:work or home office Persons participating in the virtual visit: patient, provider  I discussed the limitations of evaluation and management by telemedicine and the availability of in person appointments. The patient expressed understanding and agreed to proceed.   HPI:  Acute telemedicine visit for Covid19: -Onset: 2 days ago, tested positive for covid yesterday -Symptoms include: congestion, cough, fever, some SOB - but this is improving, diarrhea, chills, body aches -has used her albuterol and helped the SOB, has not required it today as is doing better -Denies: CP, vomiting, inability to eat/drink/get out of bed -Has tried: none -Pertinent past medical history: see below -Pertinent medication allergies:No Known Allergies -COVID-19 vaccine status: had 2 doses and one booster -GFR 81 -not taking sominex or chlorpheniramine  ROS: See pertinent positives and negatives per HPI.  Past Medical History:  Diagnosis Date   Allergy    Anxiety    Arthritis    degenerative back, joint disturbance "everywhere "   Asthma    Chest tightness 10/27/2013   Colon polyps    COPD (chronic obstructive pulmonary disease) (HCC)    Generalized anxiety disorder 11/13/2012   Genital warts    Obesity (BMI 30-39.9) 10/14/2012   Osteoporosis    Pneumonia    Shortness of breath    pt./ reports that she is out of shape   Sinusitis, acute 10/27/2013   Tobacco abuse 09/28/2010   Wheezing 10/27/2013    Past Surgical History:  Procedure Laterality Date   ANTERIOR FUSION LUMBAR SPINE  05/06/2012   ANTERIOR LAT LUMBAR FUSION N/A 05/06/2012   Procedure: ANTERIOR LATERAL LUMBAR FUSION 3 LEVELS;  Surgeon: Sinclair Ship, MD;  Location: [place];  Service:  Orthopedics;  Laterality: N/A;  Lateral interbody fusion, lumbar 2-3,lumbar 3-4, lumbar 4-5 with instrumentation and allograft.   COLONOSCOPY     COLONOSCOPY WITH PROPOFOL N/A 12/15/2017   Procedure: COLONOSCOPY WITH PROPOFOL;  Surgeon: Rush Landmark Telford Nab., MD;  Location: [facility];  Service: Gastroenterology;  Laterality: N/A;   COLONOSCOPY WITH PROPOFOL N/A 07/08/2018   Procedure: COLONOSCOPY WITH PROPOFOL;  Surgeon: Rush Landmark Telford Nab., MD;  Location: [facility];  Service: Gastroenterology;  Laterality: N/A;   COLONOSCOPY WITH PROPOFOL N/A 09/01/2019   Procedure: COLONOSCOPY WITH PROPOFOL;  Surgeon: Rush Landmark Telford Nab., MD;  Location: [facility];  Service: Gastroenterology;  Laterality: N/A;   KNEE ARTHROSCOPY Right     R knee 01/2012, for ligament tears   POLYPECTOMY  07/08/2018   Procedure: POLYPECTOMY;  Surgeon: Mansouraty, Telford Nab., MD;  Location: [facility];  Service: Gastroenterology;;   POLYPECTOMY  09/01/2019   Procedure: POLYPECTOMY;  Surgeon: Irving Copas., MD;  Location: [facility];  Service: Gastroenterology;;   TONSILLECTOMY     VAGINAL DELIVERY     x1     Current Outpatient Medications:    acetaminophen (TYLENOL) 500 MG tablet, Take 1,000 mg by mouth 2 (two) times daily., Disp: , Rfl:    albuterol (VENTOLIN HFA) 108 (90 Base) MCG/ACT inhaler, Inhale 2 puffs into the lungs every 6 (six) hours as needed for wheezing or shortness of breath., Disp: 1 each, Rfl: 5   alendronate (FOSAMAX) 70 MG tablet, Take 1 tablet (70 mg total) by mouth once a week. TAKE WITH A FULL GLASS OF WATER  ON AN EMPTY STOMACH., Disp: 12 tablet, Rfl: 3   aspirin EC 81 MG tablet, Take 81 mg by mouth daily. Swallow whole., Disp: , Rfl:    benzonatate (TESSALON) 200 MG capsule, Take 1 capsule (200 mg total) by mouth 2 (two) times daily as needed for cough., Disp: 20 capsule, Rfl: 0   Budeson-Glycopyrrol-Formoterol (BREZTRI AEROSPHERE) 160-9-4.8 MCG/ACT AERO, Inhale 2 puffs into  the lungs 2 (two) times daily., Disp: 10.7 g, Rfl: 11   chlorpheniramine (CHLOR-TRIMETON) 4 MG tablet, Take 4 mg by mouth daily as needed for allergies., Disp: , Rfl:    citalopram (CELEXA) 20 MG tablet, Take 1 tablet (20 mg total) by mouth daily., Disp: 90 tablet, Rfl: 1   diphenhydrAMINE (SOMINEX) 25 MG tablet, Take 25 mg by mouth at bedtime., Disp: , Rfl:    fluticasone (FLONASE) 50 MCG/ACT nasal spray, Place 2 sprays into both nostrils daily., Disp: 16 g, Rfl: 6   gabapentin (NEURONTIN) 100 MG capsule, Take 100 mg in the AM, Disp: 30 capsule, Rfl: 3   gabapentin (NEURONTIN) 300 MG capsule, Take 1 capsule (300 mg total) by mouth at bedtime., Disp: 30 capsule, Rfl: 5   nirmatrelvir/ritonavir EUA (PAXLOVID) 20 x 150 MG & 10 x '100MG'$  TABS, Take 3 tablets by mouth 2 (two) times daily for 5 days. (Take nirmatrelvir 150 mg two tablets twice daily for 5 days and ritonavir 100 mg one tablet twice daily for 5 days) Patient GFR is 81, Disp: 30 tablet, Rfl: 0   simvastatin (ZOCOR) 20 MG tablet, Take 1 tablet (20 mg total) by mouth at bedtime. Take 1 tablet (20 mg total) by mouth at bedtime., Disp: 90 tablet, Rfl: 1   tiZANidine (ZANAFLEX) 4 MG capsule, Take 1 capsule (4 mg total) by mouth 3 (three) times daily as needed for muscle spasms., Disp: 30 capsule, Rfl: 1   vitamin B-12 (CYANOCOBALAMIN) 1000 MCG tablet, Take 1,000 mcg by mouth daily., Disp: , Rfl:    Vitamin D, Ergocalciferol, (DRISDOL) 1.25 MG (50000 UNIT) CAPS capsule, TAKE 1 CAPSULE (50,000 UNITS TOTAL) BY MOUTH EVERY 7 (SEVEN) DAYS., Disp: 12 capsule, Rfl: 1   hydrochlorothiazide (MICROZIDE) 12.5 MG capsule, Take 1 capsule (12.5 mg total) by mouth daily., Disp: 90 capsule, Rfl: 3   nitroGLYCERIN (NITROSTAT) 0.4 MG SL tablet, Place 1 tablet (0.4 mg total) under the tongue every 5 (five) minutes as needed for chest pain., Disp: 90 tablet, Rfl: 3  EXAM:  VITALS per patient if applicable:  GENERAL: alert, oriented, appears well and in no acute  distress  HEENT: atraumatic, conjunttiva clear, no obvious abnormalities on inspection of external nose and ears  NECK: normal movements of the head and neck  LUNGS: on inspection no signs of respiratory distress, breathing rate appears normal, no obvious gross SOB, gasping or wheezing  CV: no obvious cyanosis  MS: moves all visible extremities without noticeable abnormality  PSYCH/NEURO: pleasant and cooperative, no obvious depression or anxiety, speech and thought processing grossly intact  ASSESSMENT AND PLAN:  Discussed the following assessment and plan:  COVID-19   Discussed treatment options (infusions and oral options and risk of drug interactions), ideal treatment window, potential complications, isolation and precautions for COVID-19.  Discussed possibility of rebound with antivirals and the need to reisolate if it should occur for 5 days. Checked for/reviewed any labs done in the last 90 days with GFR listed in HPI if available. After lengthy discussion, the patient opted for treatment with Paxlovid due to being higher risk for  complications of covid or severe disease and other factors. Discussed EUA status of this drug and the fact that there is preliminary limited knowledge of risks/interactions/side effects per EUA document vs possible benefits and precautions. This information was shared with patient during the visit and also was provided in patient instructions.  She agrees to hold her statin and restart 5 days after finishing her course of treatment.  Discussed potential interaction with budesonide and her inhaler.  Also, advised that patient discuss risks/interactions and use with pharmacist/treatment team as well. The patient did want a prescription for cough, Tessalon Rx sent.  Other symptomatic care measures summarized in patient instructions. Work/School slipped offered: declined Advised to seek prompt in person care if worsening, new symptoms arise, or if is not improving  with treatment. Discussed options for inperson care if PCP office not available. Did let this patient know that I only do telemedicine on Tuesdays and Thursdays for [place]. Advised to schedule follow up visit with PCP or [facility] if any further questions or concerns to avoid delays in care.   I discussed the assessment and treatment plan with the patient. The patient was provided an opportunity to ask questions and all were answered. The patient agreed with the plan and demonstrated an understanding of the instructions.     Lucretia Kern, DO

## 2020-09-15 ENCOUNTER — Other Ambulatory Visit: Payer: Self-pay

## 2020-09-15 ENCOUNTER — Telehealth (INDEPENDENT_AMBULATORY_CARE_PROVIDER_SITE_OTHER): Payer: Medicare HMO | Admitting: Family Medicine

## 2020-09-15 ENCOUNTER — Encounter: Payer: Self-pay | Admitting: Family Medicine

## 2020-09-15 VITALS — BP 137/65 | Temp 99.5°F

## 2020-09-15 DIAGNOSIS — U071 COVID-19: Secondary | ICD-10-CM | POA: Diagnosis not present

## 2020-09-15 DIAGNOSIS — J208 Acute bronchitis due to other specified organisms: Secondary | ICD-10-CM

## 2020-09-15 MED ORDER — BENZONATATE 200 MG PO CAPS: 200.0000 mg | ORAL_CAPSULE | Freq: Two times a day (BID) | ORAL | 0 refills | Status: DC | PRN

## 2020-09-15 MED ORDER — AZITHROMYCIN 250 MG PO TABS: ORAL_TABLET | ORAL | 0 refills | Status: DC

## 2020-09-15 MED ORDER — PREDNISONE 10 MG PO TABS: ORAL_TABLET | ORAL | 0 refills | Status: DC

## 2020-09-15 NOTE — Progress Notes (Signed)
MyChart Video Visit    Virtual Visit via Video Note   This visit type was conducted due to national recommendations for restrictions regarding the COVID-19 Pandemic (e.g. social distancing) in an effort to limit this patient's exposure and mitigate transmission in our community. This patient is at least at moderate risk for complications without adequate follow up. This format is felt to be most appropriate for this patient at this time. Physical exam was limited by quality of the video and audio technology used for the visit. Jasmine Mclaughlin  was able to get the patient set up on a video visit.  Patient location: Home Patient and provider in visit with scribe  Provider location: Office  I discussed the limitations of evaluation and management by telemedicine and the availability of in person appointments. The patient expressed understanding and agreed to proceed.  Visit Date: 09/15/2020  Today's healthcare provider: Ann Held, DO      Subjective:    Patient ID: Jasmine Mclaughlin, female    DOB: 1950/04/10, 70 y.o.   MRN: [id number]  No chief complaint on file.   HPI Patient is in today for a video visit.  She mentions she had Covid-19 few weeks ago. She was prescribed anti-viral and cough medicine that provided great relief. However, she is having sinus pressure that is spreading to the back of her throat, wheezing, a non-productive cough and tightness in her chest. She denies chest pain. This morning, her temperature was 99.5 She is still using her allergy medicine and mentions they provide great relief.  Past Medical History:  Diagnosis Date   Allergy    Anxiety    Arthritis    degenerative back, joint disturbance "everywhere "   Asthma    Chest tightness 10/27/2013   Jasmine polyps    COPD (chronic obstructive pulmonary disease) (HCC)    Generalized anxiety disorder 11/13/2012   Genital warts    Obesity (BMI 30-39.9) 10/14/2012   Osteoporosis    Pneumonia     Shortness of breath    pt./ reports that she is out of shape   Sinusitis, acute 10/27/2013   Tobacco abuse 09/28/2010   Wheezing 10/27/2013    Past Surgical History:  Procedure Laterality Date   ANTERIOR FUSION LUMBAR SPINE  05/06/2012   ANTERIOR LAT LUMBAR FUSION N/A 05/06/2012   Procedure: ANTERIOR LATERAL LUMBAR FUSION 3 LEVELS;  Surgeon: Sinclair Ship, MD;  Location: [facility];  Service: Orthopedics;  Laterality: N/A;  Lateral interbody fusion, lumbar 2-3,lumbar 3-4, lumbar 4-5 with instrumentation and allograft.   COLONOSCOPY     COLONOSCOPY WITH PROPOFOL N/A 12/15/2017   Procedure: COLONOSCOPY WITH PROPOFOL;  Surgeon: Rush Landmark Telford Nab., MD;  Location: [facility];  Service: Gastroenterology;  Laterality: N/A;   COLONOSCOPY WITH PROPOFOL N/A 07/08/2018   Procedure: COLONOSCOPY WITH PROPOFOL;  Surgeon: Rush Landmark Telford Nab., MD;  Location: [facility];  Service: Gastroenterology;  Laterality: N/A;   COLONOSCOPY WITH PROPOFOL N/A 09/01/2019   Procedure: COLONOSCOPY WITH PROPOFOL;  Surgeon: Rush Landmark Telford Nab., MD;  Location: [facility];  Service: Gastroenterology;  Laterality: N/A;   KNEE ARTHROSCOPY Right     R knee 01/2012, for ligament tears   POLYPECTOMY  07/08/2018   Procedure: POLYPECTOMY;  Surgeon: Mansouraty, Telford Nab., MD;  Location: [facility];  Service: Gastroenterology;;   POLYPECTOMY  09/01/2019   Procedure: POLYPECTOMY;  Surgeon: Irving Copas., MD;  Location: [facility];  Service: Gastroenterology;;   TONSILLECTOMY     VAGINAL DELIVERY  x1    Family History  Problem Relation Age of Onset   Arthritis Mother    Arthritis Father    Stroke Father    Jasmine cancer Father 65   Heart disease Father 67       chf   Breast cancer Maternal Grandmother    Asthma Maternal Grandmother    Asthma Daughter    Esophageal cancer Neg Hx    Ulcerative colitis Neg Hx    Stomach cancer Neg Hx     Social History   Socioeconomic History   Marital  status: Single    Spouse name: Not on file   Number of children: 1   Years of education: Not on file   Highest education level: Not on file  Occupational History   Occupation: [employer]    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  Tobacco Use   Smoking status: Former    Packs/day: 0.50    Years: 55.00    Pack years: 27.50    Types: Cigarettes   Smokeless tobacco: Never  Vaping Use   Vaping Use: Never used  Substance and Sexual Activity   Alcohol use: Yes    Alcohol/week: 0.0 standard drinks    Comment: "sporatic"- wine, beer, mixed drink   Drug use: No   Sexual activity: Not Currently    Partners: Male  Other Topics Concern   Not on file  Social History Narrative   1 caffeine drinks daily   No exercise   Left handed   One story home   Social Determinants of Health   Financial Resource Strain: Not on file  Food Insecurity: Not on file  Transportation Needs: Not on file  Physical Activity: Not on file  Stress: Not on file  Social Connections: Not on file  Intimate Partner Violence: Not on file    Outpatient Medications Prior to Visit  Medication Sig Dispense Refill   acetaminophen (TYLENOL) 500 MG tablet Take 1,000 mg by mouth 2 (two) times daily.     albuterol (VENTOLIN HFA) 108 (90 Base) MCG/ACT inhaler Inhale 2 puffs into the lungs every 6 (six) hours as needed for wheezing or shortness of breath. 1 each 5   alendronate (FOSAMAX) 70 MG tablet Take 1 tablet (70 mg total) by mouth once a week. TAKE WITH A FULL GLASS OF WATER ON AN EMPTY STOMACH. 12 tablet 3   aspirin EC 81 MG tablet Take 81 mg by mouth daily. Swallow whole.     benzonatate (TESSALON) 200 MG capsule Take 1 capsule (200 mg total) by mouth 2 (two) times daily as needed for cough. 20 capsule 0   Budeson-Glycopyrrol-Formoterol (BREZTRI AEROSPHERE) 160-9-4.8 MCG/ACT AERO Inhale 2 puffs into the lungs 2 (two) times daily. 10.7 g 11   chlorpheniramine (CHLOR-TRIMETON) 4 MG tablet Take 4 mg by mouth daily as  needed for allergies.     citalopram (CELEXA) 20 MG tablet Take 1 tablet (20 mg total) by mouth daily. 90 tablet 1   diphenhydrAMINE (SOMINEX) 25 MG tablet Take 25 mg by mouth at bedtime.     fluticasone (FLONASE) 50 MCG/ACT nasal spray Place 2 sprays into both nostrils daily. 16 g 6   gabapentin (NEURONTIN) 100 MG capsule Take 100 mg in the AM 30 capsule 3   gabapentin (NEURONTIN) 300 MG capsule Take 1 capsule (300 mg total) by mouth at bedtime. 30 capsule 5   hydrochlorothiazide (MICROZIDE) 12.5 MG capsule Take 1 capsule (12.5 mg total) by mouth daily. 90 capsule 3  nitroGLYCERIN (NITROSTAT) 0.4 MG SL tablet Place 1 tablet (0.4 mg total) under the tongue every 5 (five) minutes as needed for chest pain. 90 tablet 3   simvastatin (ZOCOR) 20 MG tablet Take 1 tablet (20 mg total) by mouth at bedtime. Take 1 tablet (20 mg total) by mouth at bedtime. 90 tablet 1   tiZANidine (ZANAFLEX) 4 MG capsule Take 1 capsule (4 mg total) by mouth 3 (three) times daily as needed for muscle spasms. 30 capsule 1   vitamin B-12 (CYANOCOBALAMIN) 1000 MCG tablet Take 1,000 mcg by mouth daily.     Vitamin D, Ergocalciferol, (DRISDOL) 1.25 MG (50000 UNIT) CAPS capsule TAKE 1 CAPSULE (50,000 UNITS TOTAL) BY MOUTH EVERY 7 (SEVEN) DAYS. 12 capsule 1   No facility-administered medications prior to visit.    No Known Allergies  Review of Systems  Constitutional:  Negative for fever.  HENT:  Positive for sinus pain.   Respiratory:  Positive for cough and wheezing.   Cardiovascular:  Negative for chest pain.       (+) chest tightness      Objective:    Physical Exam Vitals and nursing note reviewed.  Constitutional:      Appearance: Normal appearance. She is well-developed.  Neck:     Thyroid: No thyromegaly.     Vascular: No JVD.  Pulmonary:     Effort: Pulmonary effort is normal.  Neurological:     Mental Status: She is alert and oriented to person, place, and time.  Psychiatric:        Mood and Affect:  Mood normal.        Behavior: Behavior normal.        Thought Content: Thought content normal.        Judgment: Judgment normal.    There were no vitals taken for this visit. Wt Readings from Last 3 Encounters:  08/08/20 215 lb 9.6 oz (97.8 kg)  06/09/20 210 lb (95.3 kg)  05/26/20 211 lb (95.7 kg)    Diabetic Foot Exam - Simple   No data filed    Lab Results  Component Value Date   WBC 8.4 12/01/2019   HGB 13.8 12/01/2019   HCT 40.8 12/01/2019   PLT 335.0 12/01/2019   GLUCOSE 95 08/08/2020   CHOL 151 08/08/2020   TRIG 181.0 (H) 08/08/2020   HDL 51.50 08/08/2020   LDLDIRECT 135.0 12/23/2019   LDLCALC 63 08/08/2020   ALT 17 08/08/2020   AST 19 08/08/2020   NA 136 08/08/2020   K 4.7 08/08/2020   CL 99 08/08/2020   CREATININE 0.75 08/08/2020   BUN 20 08/08/2020   CO2 28 08/08/2020   TSH 0.95 09/23/2019   INR 1.0 11/14/2017    Lab Results  Component Value Date   TSH 0.95 09/23/2019   Lab Results  Component Value Date   WBC 8.4 12/01/2019   HGB 13.8 12/01/2019   HCT 40.8 12/01/2019   MCV 99.3 12/01/2019   PLT 335.0 12/01/2019   Lab Results  Component Value Date   NA 136 08/08/2020   K 4.7 08/08/2020   CO2 28 08/08/2020   GLUCOSE 95 08/08/2020   BUN 20 08/08/2020   CREATININE 0.75 08/08/2020   BILITOT 0.5 08/08/2020   ALKPHOS 70 08/08/2020   AST 19 08/08/2020   ALT 17 08/08/2020   PROT 7.3 08/08/2020   ALBUMIN 4.4 08/08/2020   CALCIUM 9.8 08/08/2020   EGFR 77 03/13/2020   GFR 81.37 08/08/2020   Lab Results  Component Value Date   CHOL 151 08/08/2020   Lab Results  Component Value Date   HDL 51.50 08/08/2020   Lab Results  Component Value Date   LDLCALC 63 08/08/2020   Lab Results  Component Value Date   TRIG 181.0 (H) 08/08/2020   Lab Results  Component Value Date   CHOLHDL 3 08/08/2020   No results found for: HGBA1C     Assessment & Plan:   Problem List Items Addressed This Visit   None   @ENCMEDP @  No orders of the  defined types were placed in this encounter.   I discussed the assessment and treatment plan with the patient. The patient was provided an opportunity to ask questions and all were answered. The patient agreed with the plan and demonstrated an understanding of the instructions.   The patient was advised to call back or seek an in-person evaluation if the symptoms worsen or if the condition fails to improve as anticipated.  I provided 20 minutes of face-to-face time during this encounter.   I,Zite Okoli,acting as a Education administrator for [employer], DO.,have documented all relevant documentation on the behalf of Ann Held, DO,as directed by  Ann Held, DO while in the presence of Blue Bell, DO, have reviewed all documentation for this visit. The documentation on 09/15/20 for the exam, diagnosis, procedures, and orders are all accurate and complete.    Ann Held, DO [place] [facility] [employer] [facility] [phone] (phone) [phone] (fax)  [place]

## 2020-09-18 ENCOUNTER — Ambulatory Visit: Payer: Medicare HMO | Admitting: Neurology

## 2020-09-21 ENCOUNTER — Ambulatory Visit: Payer: Medicare HMO | Admitting: Neurology

## 2020-09-25 ENCOUNTER — Other Ambulatory Visit: Payer: Self-pay | Admitting: Family Medicine

## 2020-09-25 DIAGNOSIS — F419 Anxiety disorder, unspecified: Secondary | ICD-10-CM

## 2020-10-01 ENCOUNTER — Other Ambulatory Visit: Payer: Self-pay | Admitting: Neurology

## 2020-10-04 ENCOUNTER — Other Ambulatory Visit: Payer: Self-pay | Admitting: Neurology

## 2020-10-05 ENCOUNTER — Other Ambulatory Visit: Payer: Self-pay

## 2020-10-05 MED ORDER — GABAPENTIN 300 MG PO CAPS: 300.0000 mg | ORAL_CAPSULE | Freq: Every day | ORAL | 3 refills | Status: DC

## 2020-10-10 DIAGNOSIS — H5203 Hypermetropia, bilateral: Secondary | ICD-10-CM | POA: Diagnosis not present

## 2020-10-10 DIAGNOSIS — H52223 Regular astigmatism, bilateral: Secondary | ICD-10-CM | POA: Diagnosis not present

## 2020-10-10 DIAGNOSIS — H524 Presbyopia: Secondary | ICD-10-CM | POA: Diagnosis not present

## 2020-10-20 ENCOUNTER — Other Ambulatory Visit: Payer: Self-pay

## 2020-10-20 ENCOUNTER — Ambulatory Visit (HOSPITAL_COMMUNITY)
Admission: RE | Admit: 2020-10-20 | Discharge: 2020-10-20 | Disposition: A | Discharge status: Home / Self Care | Payer: Medicare HMO | Source: Ambulatory Visit | Attending: Acute Care | Admitting: Acute Care

## 2020-10-20 DIAGNOSIS — F17210 Nicotine dependence, cigarettes, uncomplicated: Secondary | ICD-10-CM | POA: Diagnosis not present

## 2020-10-20 DIAGNOSIS — Z87891 Personal history of nicotine dependence: Secondary | ICD-10-CM | POA: Insufficient documentation

## 2020-10-20 DIAGNOSIS — R69 Illness, unspecified: Secondary | ICD-10-CM | POA: Diagnosis not present

## 2020-10-20 IMAGING — CT CT CHEST LUNG CANCER SCREENING LOW DOSE W/O CM
2 of 5 series · 15 of 40 positions shown, 18 images · non-contrast
Comparison: [DATE].

CLINICAL DATA: Current smoker, 56 pack-year history.

EXAM:
CT CHEST WITHOUT CONTRAST LOW-DOSE FOR LUNG CANCER SCREENING
TECHNIQUE: Multidetector CT imaging of the chest was performed following the
standard protocol without IV contrast.

[Series 4: lungs (person_name) · axial · 0.89mm/px · z∈[-120,+150]mm · 12 of 298 slices shown, 15 images]
[im 14/298  mediastinal]
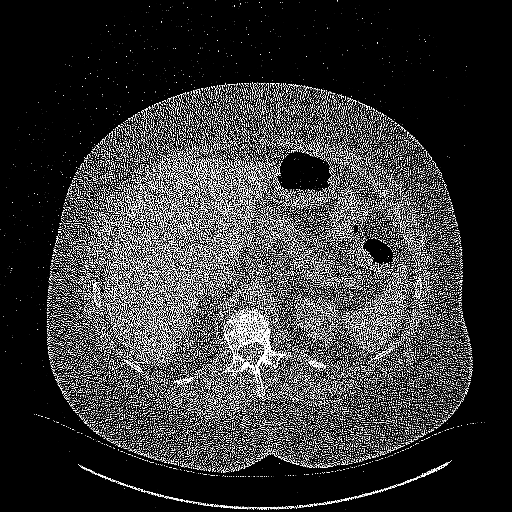
[im 14/298  lung]
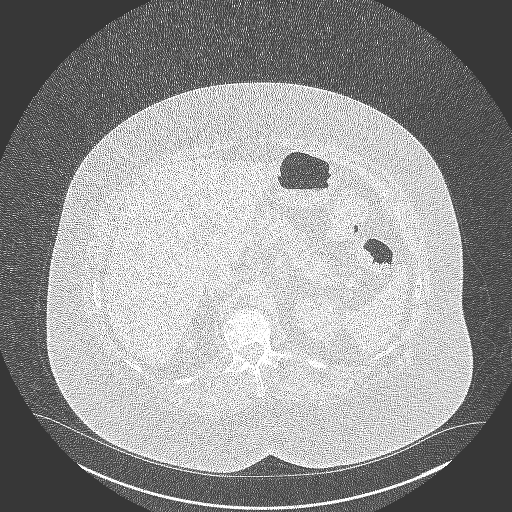
[im 41/298  lung]
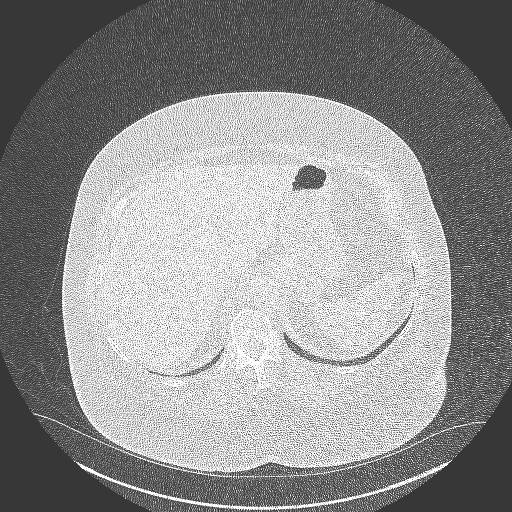
[im 68/298  lung]
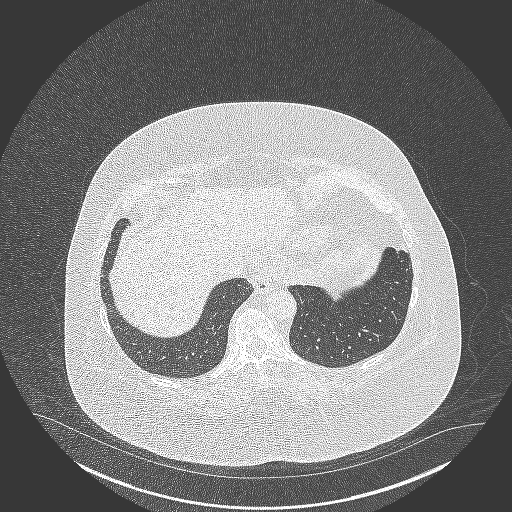
[im 95/298  lung]
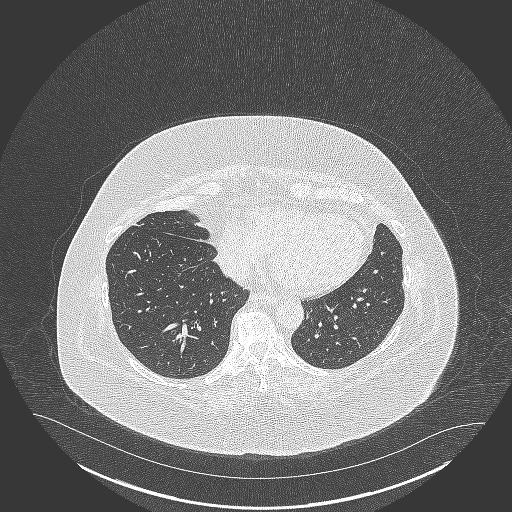
[im 109/298  mediastinal]
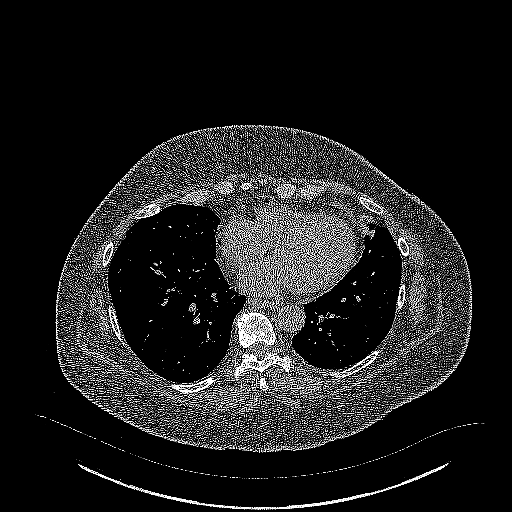
[im 109/298  lung]
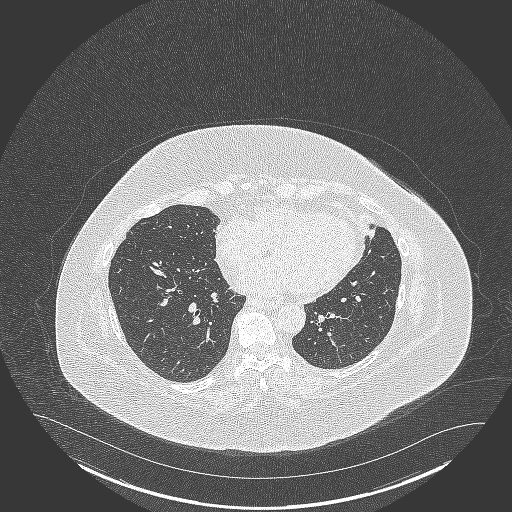
[im 136/298  lung]
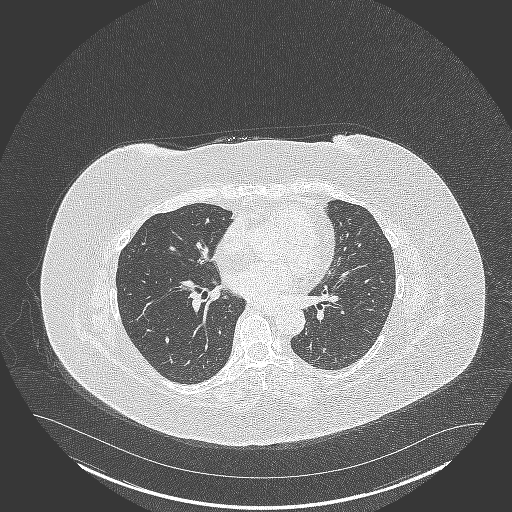
[im 163/298  lung]
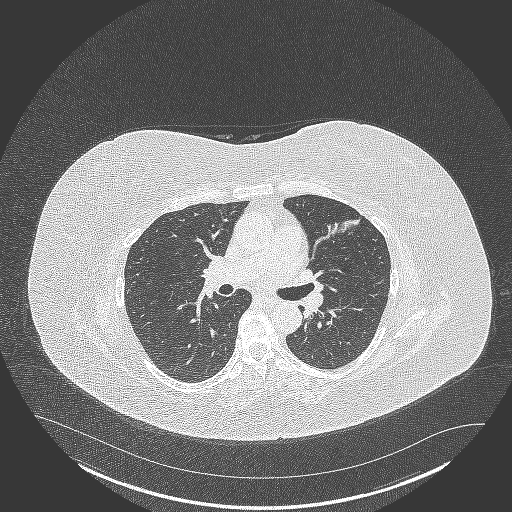
[im 190/298  lung]
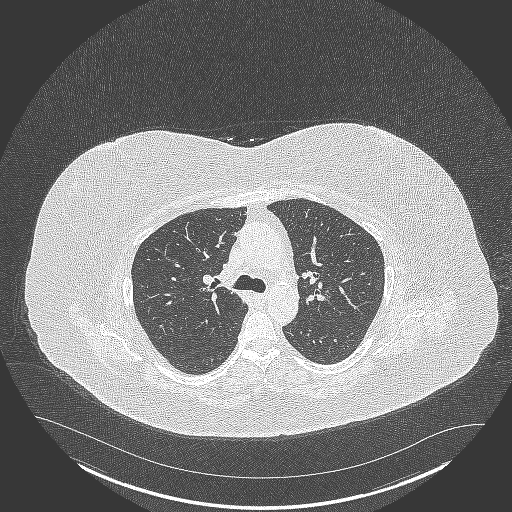
[im 203/298  mediastinal]
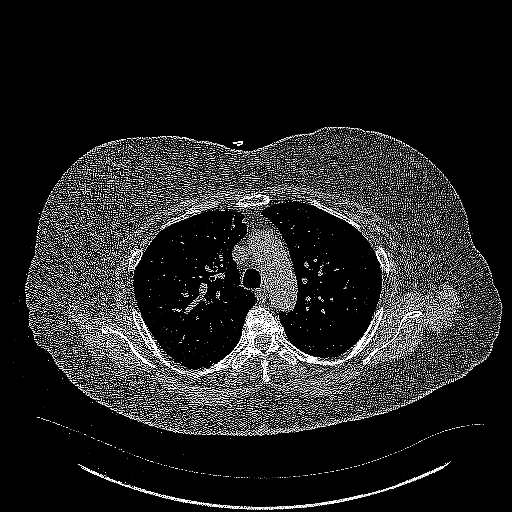
[im 203/298  lung]
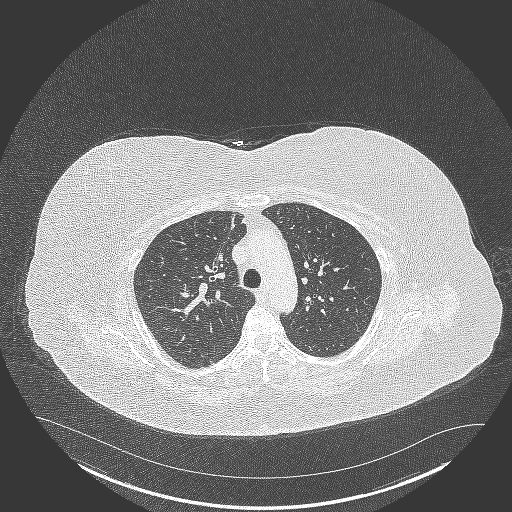
[im 230/298  lung]
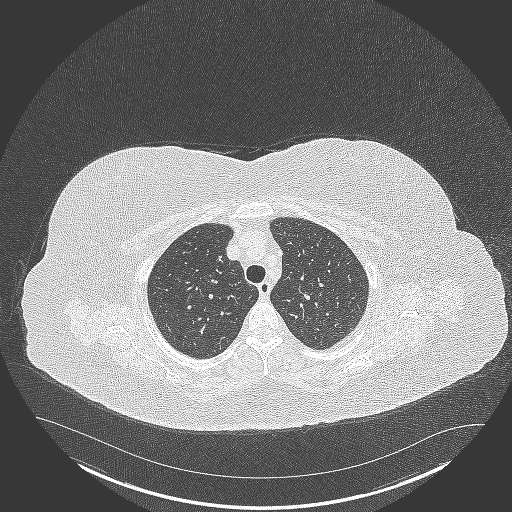
[im 257/298  lung]
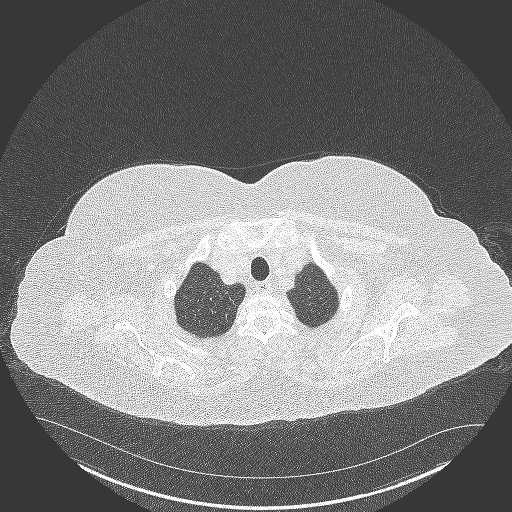
[im 284/298  lung]
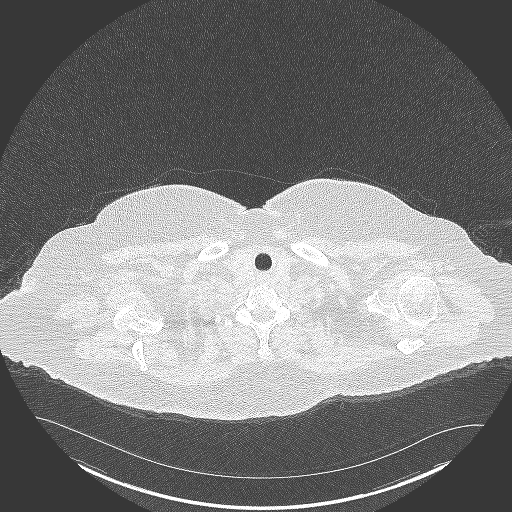

[Series 5: coronal (person_name) · coronal · 0.65mm/px · 3 of 347 slices shown]
[im 70/347  lung]
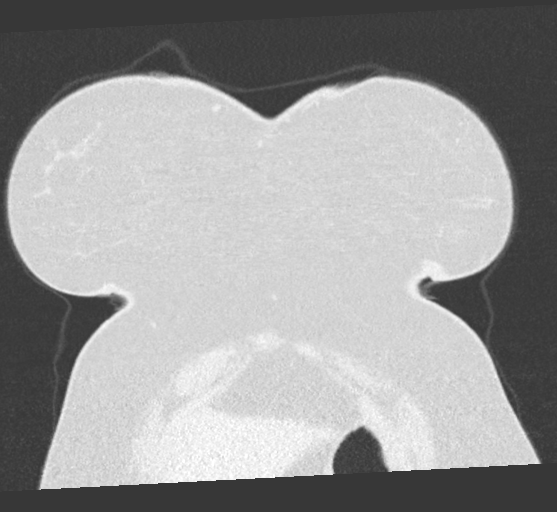
[im 139/347  lung]
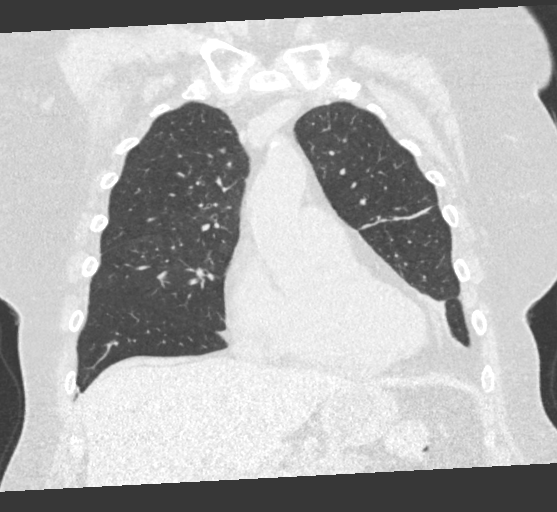
[im 208/347  lung]
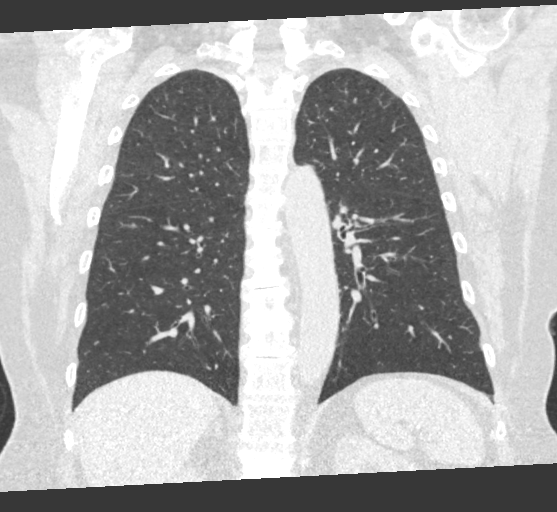

[15 of 40 positions shown; findings below may reference images not displayed]

FINDINGS: Cardiovascular: Atherosclerotic calcification of the aorta and
coronary arteries. Heart is at the upper limits of normal in size.
No pericardial effusion.

Mediastinum/Nodes: No pathologically enlarged mediastinal or
axillary lymph nodes. Hilar regions are difficult to definitively
evaluate without IV contrast but appear grossly unremarkable.
Esophagus is grossly unremarkable.

Lungs/Pleura: Smoking related respiratory bronchiolitis. Scattered
pulmonary parenchymal scarring. Pulmonary nodules measure 3.5 mm or
less in size. No new or suspicious pulmonary nodules. No pleural
fluid. Airway is unremarkable.

Upper Abdomen: Visualized portions of the liver, adrenal glands,
kidneys, spleen, pancreas, stomach and bowel are grossly
unremarkable.

Musculoskeletal: Degenerative changes in the spine. Old right rib
fracture.
IMPRESSION: 1. Lung-RADS 2, benign appearance or behavior. Continue annual
screening with low-dose chest CT without contrast in 12 months.
2. Aortic atherosclerosis ([2L]-[2L]). Coronary artery
calcification.

## 2020-10-31 ENCOUNTER — Encounter: Payer: Self-pay | Admitting: Family Medicine

## 2020-10-31 DIAGNOSIS — M62838 Other muscle spasm: Secondary | ICD-10-CM

## 2020-10-31 MED ORDER — TIZANIDINE HCL 4 MG PO CAPS: 4.0000 mg | ORAL_CAPSULE | Freq: Three times a day (TID) | ORAL | 1 refills | Status: DC | PRN

## 2020-11-03 ENCOUNTER — Telehealth: Payer: Self-pay

## 2020-11-03 NOTE — Telephone Encounter (Signed)
Called pt to discuss her question about Atherosclerotic calcification and aortic atherosclerosis.

## 2020-11-07 NOTE — Telephone Encounter (Signed)
Pt is returning call.  

## 2020-11-07 NOTE — Telephone Encounter (Signed)
Called pt to clarify some questions for her. She asked about different wording on her lung CT scan. All questions were addressed, pt is pleased at this time.

## 2020-11-09 ENCOUNTER — Other Ambulatory Visit: Payer: Self-pay

## 2020-11-09 ENCOUNTER — Ambulatory Visit (INDEPENDENT_AMBULATORY_CARE_PROVIDER_SITE_OTHER): Payer: Medicare HMO

## 2020-11-09 VITALS — Ht 65.0 in | Wt 215.0 lb

## 2020-11-09 DIAGNOSIS — Z0000 Encounter for general adult medical examination without abnormal findings: Secondary | ICD-10-CM

## 2020-11-09 DIAGNOSIS — Z780 Asymptomatic menopausal state: Secondary | ICD-10-CM | POA: Diagnosis not present

## 2020-11-09 NOTE — Progress Notes (Signed)
Subjective:   Jasmine Mclaughlin is a 70 y.o. female who presents for Medicare Annual (Subsequent) preventive examination.  I connected with Daveena today by telephone and verified that I am speaking with the correct person using two identifiers. Location patient: home Location provider: work Persons participating in the virtual visit: patient, Marine scientist.    I discussed the limitations, risks, security and privacy concerns of performing an evaluation and management service by telephone and the availability of in person appointments. I also discussed with the patient that there may be a patient responsible charge related to this service. The patient expressed understanding and verbally consented to this telephonic visit.    Interactive audio and video telecommunications were attempted between this provider and patient, however failed, due to patient having technical difficulties OR patient did not have access to video capability.  We continued and completed visit with audio only.  Some vital signs may be absent or patient reported.   Time Spent with patient on telephone encounter: 20 minutes   Review of Systems     Cardiac Risk Factors include: advanced age (>82men, >25 women);hypertension;dyslipidemia;obesity (BMI >30kg/m2)     Objective:    Today's Vitals   11/09/20 1021  Weight: 215 lb (97.5 kg)  Height: 5\' 5"  (1.651 m)   Body mass index is 35.78 kg/m.  Advanced Directives 11/09/2020 03/13/2020 09/09/2019 09/01/2019 09/08/2018 07/08/2018 12/15/2017  Does Patient Have a Medical Advance Directive? No No No No No No No  Would patient like information on creating a medical advance directive? No - Patient declined - No - Patient declined No - Patient declined No - Patient declined - No - Patient declined  Pre-existing out of facility DNR order (yellow form or pink MOST form) - - - - - - -    Current Medications (verified) Outpatient Encounter Medications as of 11/09/2020  Medication Sig    acetaminophen (TYLENOL) 500 MG tablet Take 1,000 mg by mouth 2 (two) times daily.   albuterol (VENTOLIN HFA) 108 (90 Base) MCG/ACT inhaler Inhale 2 puffs into the lungs every 6 (six) hours as needed for wheezing or shortness of breath.   alendronate (FOSAMAX) 70 MG tablet Take 1 tablet (70 mg total) by mouth once a week. TAKE WITH A FULL GLASS OF WATER ON AN EMPTY STOMACH.   aspirin EC 81 MG tablet Take 81 mg by mouth daily. Swallow whole.   benzonatate (TESSALON) 200 MG capsule Take 1 capsule (200 mg total) by mouth 2 (two) times daily as needed for cough.   Budeson-Glycopyrrol-Formoterol (BREZTRI AEROSPHERE) 160-9-4.8 MCG/ACT AERO Inhale 2 puffs into the lungs 2 (two) times daily.   chlorpheniramine (CHLOR-TRIMETON) 4 MG tablet Take 4 mg by mouth daily as needed for allergies.   citalopram (CELEXA) 20 MG tablet TAKE 1 TABLET BY MOUTH EVERY DAY   diphenhydrAMINE (SOMINEX) 25 MG tablet Take 25 mg by mouth at bedtime.   fluticasone (FLONASE) 50 MCG/ACT nasal spray Place 2 sprays into both nostrils daily.   gabapentin (NEURONTIN) 100 MG capsule Take 100 mg in the AM   gabapentin (NEURONTIN) 300 MG capsule Take 1 capsule (300 mg total) by mouth at bedtime.   simvastatin (ZOCOR) 20 MG tablet Take 1 tablet (20 mg total) by mouth at bedtime. Take 1 tablet (20 mg total) by mouth at bedtime.   tiZANidine (ZANAFLEX) 4 MG capsule Take 1 capsule (4 mg total) by mouth 3 (three) times daily as needed for muscle spasms.   vitamin B-12 (CYANOCOBALAMIN) 1000 MCG tablet  Take 1,000 mcg by mouth daily.   Vitamin D, Ergocalciferol, (DRISDOL) 1.25 MG (50000 UNIT) CAPS capsule TAKE 1 CAPSULE (50,000 UNITS TOTAL) BY MOUTH EVERY 7 (SEVEN) DAYS.   azithromycin (ZITHROMAX Z-PAK) 250 MG tablet As directed   hydrochlorothiazide (MICROZIDE) 12.5 MG capsule Take 1 capsule (12.5 mg total) by mouth daily.   nitroGLYCERIN (NITROSTAT) 0.4 MG SL tablet Place 1 tablet (0.4 mg total) under the tongue every 5 (five) minutes as needed  for chest pain.   predniSONE (DELTASONE) 10 MG tablet TAKE 3 TABLETS PO QD FOR 3 DAYS THEN TAKE 2 TABLETS PO QD FOR 3 DAYS THEN TAKE 1 TABLET PO QD FOR 3 DAYS THEN TAKE 1/2 TAB PO QD FOR 3 DAYS   No facility-administered encounter medications on file as of 11/09/2020.    Allergies (verified) Patient has no known allergies.   History: Past Medical History:  Diagnosis Date   Allergy    Anxiety    Arthritis    degenerative back, joint disturbance "everywhere "   Asthma    Chest tightness 10/27/2013   Colon polyps    COPD (chronic obstructive pulmonary disease) (HCC)    Generalized anxiety disorder 11/13/2012   Genital warts    Obesity (BMI 30-39.9) 10/14/2012   Osteoporosis    Pneumonia    Shortness of breath    pt./ reports that she is out of shape   Sinusitis, acute 10/27/2013   Tobacco abuse 09/28/2010   Wheezing 10/27/2013   Past Surgical History:  Procedure Laterality Date   ANTERIOR FUSION LUMBAR SPINE  05/06/2012   ANTERIOR LAT LUMBAR FUSION N/A 05/06/2012   Procedure: ANTERIOR LATERAL LUMBAR FUSION 3 LEVELS;  Surgeon: Sinclair Ship, MD;  Location: [place];  Service: Orthopedics;  Laterality: N/A;  Lateral interbody fusion, lumbar 2-3,lumbar 3-4, lumbar 4-5 with instrumentation and allograft.   COLONOSCOPY     COLONOSCOPY WITH PROPOFOL N/A 12/15/2017   Procedure: COLONOSCOPY WITH PROPOFOL;  Surgeon: Rush Landmark Telford Nab., MD;  Location: [place];  Service: Gastroenterology;  Laterality: N/A;   COLONOSCOPY WITH PROPOFOL N/A 07/08/2018   Procedure: COLONOSCOPY WITH PROPOFOL;  Surgeon: Rush Landmark Telford Nab., MD;  Location: [place];  Service: Gastroenterology;  Laterality: N/A;   COLONOSCOPY WITH PROPOFOL N/A 09/01/2019   Procedure: COLONOSCOPY WITH PROPOFOL;  Surgeon: Rush Landmark Telford Nab., MD;  Location: [facility];  Service: Gastroenterology;  Laterality: N/A;   KNEE ARTHROSCOPY Right     R knee 01/2012, for ligament tears   POLYPECTOMY  07/08/2018    Procedure: POLYPECTOMY;  Surgeon: Mansouraty, Telford Nab., MD;  Location: [facility];  Service: Gastroenterology;;   POLYPECTOMY  09/01/2019   Procedure: POLYPECTOMY;  Surgeon: Irving Copas., MD;  Location: [facility];  Service: Gastroenterology;;   TONSILLECTOMY     VAGINAL DELIVERY     x1   Family History  Problem Relation Age of Onset   Arthritis Mother    Arthritis Father    Stroke Father    Colon cancer Father 15   Heart disease Father 37       chf   Breast cancer Maternal Grandmother    Asthma Maternal Grandmother    Asthma Daughter    Esophageal cancer Neg Hx    Ulcerative colitis Neg Hx    Stomach cancer Neg Hx    Social History   Socioeconomic History   Marital status: Single    Spouse name: Not on file   Number of children: 1   Years of education: Not on file  Highest education level: Not on file  Occupational History   Occupation: [employer]    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  Tobacco Use   Smoking status: Former    Packs/day: 0.50    Years: 55.00    Pack years: 27.50    Types: Cigarettes   Smokeless tobacco: Never  Vaping Use   Vaping Use: Never used  Substance and Sexual Activity   Alcohol use: Yes    Alcohol/week: 0.0 standard drinks    Comment: "sporatic"- wine, beer, mixed drink   Drug use: No   Sexual activity: Not Currently    Partners: Male  Other Topics Concern   Not on file  Social History Narrative   1 caffeine drinks daily   No exercise   Left handed   One story home   Social Determinants of Health   Financial Resource Strain: Low Risk    Difficulty of Paying Living Expenses: Not hard at all  Food Insecurity: No Food Insecurity   Worried About Charity fundraiser in the Last Year: Never true   Ran Out of Food in the Last Year: Never true  Transportation Needs: No Transportation Needs   Lack of Transportation (Medical): No   Lack of Transportation (Non-Medical): No  Physical Activity: Insufficiently Active    Days of Exercise per Week: 7 days   Minutes of Exercise per Session: 10 min  Stress: No Stress Concern Present   Feeling of Stress : Not at all  Social Connections: Not on file    Tobacco Counseling Counseling given: Not Answered   Clinical Intake:  Pre-visit preparation completed: Yes  Pain : No/denies pain     BMI - recorded: 35.78 Nutritional Status: BMI > 30  Obese Nutritional Risks: None  How often do you need to have someone help you when you read instructions, pamphlets, or other written materials from your doctor or pharmacy?: 1 - Never  Diabetic?No  Interpreter Needed?: No  Information entered by :: Caroleen Hamman LPN   Activities of Daily Living In your present state of health, do you have any difficulty performing the following activities: 11/09/2020  Hearing? N  Vision? N  Difficulty concentrating or making decisions? Y  Comment occasionally  Walking or climbing stairs? N  Dressing or bathing? N  Doing errands, shopping? N  Preparing Food and eating ? N  Using the Toilet? N  In the past six months, have you accidently leaked urine? N  Do you have problems with loss of bowel control? N  Managing your Medications? N  Managing your Finances? N  Housekeeping or managing your Housekeeping? N  Some recent data might be hidden    Patient Care Team: Carollee Herter, Alferd Apa, DO as PCP - General (Family Medicine) Berniece Salines, DO as PCP - Cardiology (Cardiology)  Indicate any recent Medical Services you may have received from other than [facility] providers in the past year (date may be approximate).     Assessment:   This is a routine wellness examination for [place].  Hearing/Vision screen Hearing Screening - Comments:: C/o mild hearing loss Vision Screening - Comments:: Wears glasses Last eye exam-10/2020-[facility]  Dietary issues and exercise activities discussed: Current Exercise Habits: Home exercise routine, Type of exercise: Other - see  comments, Time (Minutes): 10, Frequency (Times/Week): 7, Weekly Exercise (Minutes/Week): 70, Intensity: Mild, Exercise limited by: None identified (exercise bike)   Goals Addressed             This  Visit's Progress    DIET - INCREASE WATER INTAKE   On track    At least 3 glasses per day.      Patient Stated       Would like to get in better shape       Depression Screen PHQ 2/9 Scores 11/09/2020 09/15/2020 09/09/2019 09/08/2018 08/11/2017 06/11/2016 10/14/2012  PHQ - 2 Score 0 0 0 1 1 0 0    Fall Risk Fall Risk  11/09/2020 03/13/2020 09/09/2019 09/08/2018 06/11/2016  Falls in the past year? 1 1 1  0 No  Number falls in past yr: 1 1 1  - -  Injury with Fall? 1 1 0 - -  Risk for fall due to : History of fall(s) - - - -  Follow up Falls prevention discussed - Education provided;Falls prevention discussed - -    FALL RISK PREVENTION PERTAINING TO THE HOME:  Any stairs in or around the home? No  Home free of loose throw rugs in walkways, pet beds, electrical cords, etc? Yes  Adequate lighting in your home to reduce risk of falls? Yes   ASSISTIVE DEVICES UTILIZED TO PREVENT FALLS:  Life alert? No  Use of a cane, walker or w/c? No  Grab bars in the bathroom? Yes  Shower chair or bench in shower? No  Elevated toilet seat or a handicapped toilet? No   TIMED UP AND GO:  Was the test performed? No . Phone visit   Cognitive Function: MMSE - Mini Mental State Exam 06/11/2016  Orientation to time 5  Orientation to Place 5  Registration 3  Attention/ Calculation 5  Recall 3  Language- name 2 objects 2  Language- repeat 1  Language- follow 3 step command 3  Language- read & follow direction 1  Write a sentence 1  Copy design 1  Total score 30     6CIT Screen 11/09/2020  What Year? 0 points  What month? 0 points  What time? 0 points  Count back from 20 0 points  Months in reverse 0 points  Repeat phrase 0 points  Total Score 0    Immunizations Immunization History  Administered  Date(s) Administered   Fluad Quad(high Dose 65+) 09/22/2018, 09/23/2019   Influenza-Unspecified 11/14/2014   PFIZER(Purple Top)SARS-COV-2 Vaccination 03/21/2019, 04/10/2019, 10/20/2019   Pneumococcal Conjugate-13 08/29/2017   Pneumococcal Polysaccharide-23 02/15/2014, 09/22/2018   Tdap 08/29/2010, 01/14/2020   Zoster, Live 10/14/2012    TDAP status: Up to date  Flu Vaccine status: Due, Education has been provided regarding the importance of this vaccine. Advised may receive this vaccine at local pharmacy or Health Dept. Aware to provide a copy of the vaccination record if obtained from local pharmacy or Health Dept. Verbalized acceptance and understanding.  Pneumococcal vaccine status: Up to date  Covid-19 vaccine status: Information provided on how to obtain vaccines.   Qualifies for Shingles Vaccine? Yes   Zostavax completed Yes   Shingrix Completed?: No.    Education has been provided regarding the importance of this vaccine. Patient has been advised to call insurance company to determine out of pocket expense if they have not yet received this vaccine. Advised may also receive vaccine at local pharmacy or Health Dept. Verbalized acceptance and understanding.  Screening Tests Health Maintenance  Topic Date Due   Zoster Vaccines- Shingrix (1 of 2) Never done   COVID-19 Vaccine (4 - Booster for Pfizer series) 12/15/2019   INFLUENZA VACCINE  08/07/2020   MAMMOGRAM  11/28/2020   COLONOSCOPY (Pts 45-53yrs  Insurance coverage will need to be confirmed)  08/31/2024   TETANUS/TDAP  01/13/2030   Pneumonia Vaccine 68+ Years old  Completed   DEXA SCAN  Completed   Hepatitis C Screening  Completed   HPV VACCINES  Aged Out    Health Maintenance  Health Maintenance Due  Topic Date Due   Zoster Vaccines- Shingrix (1 of 2) Never done   COVID-19 Vaccine (4 - Booster for Pfizer series) 12/15/2019   INFLUENZA VACCINE  08/07/2020    Colorectal cancer screening: Type of screening:  Colonoscopy. Completed 09/01/2019. Repeat every 3 years  Mammogram status: Completed bilateral 11/29/2019. Repeat every year  Bone Density status: Completed 12/08/2018. Results reflect: Bone density results: OSTEOPOROSIS. Repeat every 2 years.  Lung Cancer Screening: (Low Dose CT Chest recommended if Age 73-80 years, 30 pack-year currently smoking OR have quit w/in 15years.) does not qualify.     Additional Screening:  Hepatitis C Screening: Completed 06/11/2016  Vision Screening: Recommended annual ophthalmology exams for early detection of glaucoma and other disorders of the eye. Is the patient up to date with their annual eye exam?  Yes  Who is the provider or what is the name of the office in which the patient attends annual eye exams? [facility]  Dental Screening: Recommended annual dental exams for proper oral hygiene  Community Resource Referral / Chronic Care Management: CRR required this visit?  No   CCM required this visit?  No      Plan:     I have personally reviewed and noted the following in the patient's chart:   Medical and social history Use of alcohol, tobacco or illicit drugs  Current medications and supplements including opioid prescriptions.  Functional ability and status Nutritional status Physical activity Advanced directives List of other physicians Hospitalizations, surgeries, and ER visits in previous 12 months Vitals Screenings to include cognitive, depression, and falls Referrals and appointments  In addition, I have reviewed and discussed with patient certain preventive protocols, quality metrics, and best practice recommendations. A written personalized care plan for preventive services as well as general preventive health recommendations were provided to patient.   Due to this being a telephonic visit, the after visit summary with patients personalized plan was offered to patient via mail or my-chart.  Patient would like to access on  my-chart.   Marta Antu, LPN   20/06/154  Nurse Health Advisor  Nurse Notes: None

## 2020-11-09 NOTE — Patient Instructions (Signed)
Ms. Royce Thank you for taking time to complete your Medicare Wellness Visit. I appreciate your ongoing commitment to your health goals. Please review the following plan we discussed and let me know if I can assist you in the future.   Screening recommendations/referrals: Colonoscopy: Completed 09/01/2019-Due-09/01/2022 Mammogram: Completed 11/29/2019-Due 11/28/2020 Bone Density: Completed 12/08/2018-Due 12/07/2020-Ordered today. Recommended yearly ophthalmology/optometry visit for glaucoma screening and checkup Recommended yearly dental visit for hygiene and checkup  Vaccinations: Influenza vaccine: Due-May obtain vaccine at our office or your local pharmacy. Pneumococcal vaccine: Up to date Tdap vaccine: Up to date-Due-01/13/2030 Shingles vaccine: Discuss with pharmacy   Covid-19:Booster available at the pharmacy  Advanced directives: Information to be picked up in the office tomorrow.  Conditions/risks identified: See problem list  Next appointment: Follow up in one year for your annual wellness visit    Preventive Care 65 Years and Older, Female Preventive care refers to lifestyle choices and visits with your health care provider that can promote health and wellness. What does preventive care include? A yearly physical exam. This is also called an annual well check. Dental exams once or twice a year. Routine eye exams. Ask your health care provider how often you should have your eyes checked. Personal lifestyle choices, including: Daily care of your teeth and gums. Regular physical activity. Eating a healthy diet. Avoiding tobacco and drug use. Limiting alcohol use. Practicing safe sex. Taking low-dose aspirin every day. Taking vitamin and mineral supplements as recommended by your health care provider. What happens during an annual well check? The services and screenings done by your health care provider during your annual well check will depend on your age, overall health,  lifestyle risk factors, and family history of disease. Counseling  Your health care provider may ask you questions about your: Alcohol use. Tobacco use. Drug use. Emotional well-being. Home and relationship well-being. Sexual activity. Eating habits. History of falls. Memory and ability to understand (cognition). Work and work Statistician. Reproductive health. Screening  You may have the following tests or measurements: Height, weight, and BMI. Blood pressure. Lipid and cholesterol levels. These may be checked every 5 years, or more frequently if you are over 64 years old. Skin check. Lung cancer screening. You may have this screening every year starting at age 47 if you have a 30-pack-year history of smoking and currently smoke or have quit within the past 15 years. Fecal occult blood test (FOBT) of the stool. You may have this test every year starting at age 56. Flexible sigmoidoscopy or colonoscopy. You may have a sigmoidoscopy every 5 years or a colonoscopy every 10 years starting at age 62. Hepatitis C blood test. Hepatitis B blood test. Sexually transmitted disease (STD) testing. Diabetes screening. This is done by checking your blood sugar (glucose) after you have not eaten for a while (fasting). You may have this done every 1-3 years. Bone density scan. This is done to screen for osteoporosis. You may have this done starting at age 84. Mammogram. This may be done every 1-2 years. Talk to your health care provider about how often you should have regular mammograms. Talk with your health care provider about your test results, treatment options, and if necessary, the need for more tests. Vaccines  Your health care provider may recommend certain vaccines, such as: Influenza vaccine. This is recommended every year. Tetanus, diphtheria, and acellular pertussis (Tdap, Td) vaccine. You may need a Td booster every 10 years. Zoster vaccine. You may need this after age  86. Pneumococcal  13-valent conjugate (PCV13) vaccine. One dose is recommended after age 42. Pneumococcal polysaccharide (PPSV23) vaccine. One dose is recommended after age 2. Talk to your health care provider about which screenings and vaccines you need and how often you need them. This information is not intended to replace advice given to you by your health care provider. Make sure you discuss any questions you have with your health care provider. Document Released: 01/20/2015 Document Revised: 09/13/2015 Document Reviewed: 10/25/2014 Elsevier Interactive Patient Education  2017 [place] Prevention in the Home Falls can cause injuries. They can happen to people of all ages. There are many things you can do to make your home safe and to help prevent falls. What can I do on the outside of my home? Regularly fix the edges of walkways and driveways and fix any cracks. Remove anything that might make you trip as you walk through a door, such as a raised step or threshold. Trim any bushes or trees on the path to your home. Use bright outdoor lighting. Clear any walking paths of anything that might make someone trip, such as rocks or tools. Regularly check to see if handrails are loose or broken. Make sure that both sides of any steps have handrails. Any raised decks and porches should have guardrails on the edges. Have any leaves, snow, or ice cleared regularly. Use sand or salt on walking paths during winter. Clean up any spills in your garage right away. This includes oil or grease spills. What can I do in the bathroom? Use night lights. Install grab bars by the toilet and in the tub and shower. Do not use towel bars as grab bars. Use non-skid mats or decals in the tub or shower. If you need to sit down in the shower, use a plastic, non-slip stool. Keep the floor dry. Clean up any water that spills on the floor as soon as it happens. Remove soap buildup in the tub or shower  regularly. Attach bath mats securely with double-sided non-slip rug tape. Do not have throw rugs and other things on the floor that can make you trip. What can I do in the bedroom? Use night lights. Make sure that you have a light by your bed that is easy to reach. Do not use any sheets or blankets that are too big for your bed. They should not hang down onto the floor. Have a firm chair that has side arms. You can use this for support while you get dressed. Do not have throw rugs and other things on the floor that can make you trip. What can I do in the kitchen? Clean up any spills right away. Avoid walking on wet floors. Keep items that you use a lot in easy-to-reach places. If you need to reach something above you, use a strong step stool that has a grab bar. Keep electrical cords out of the way. Do not use floor polish or wax that makes floors slippery. If you must use wax, use non-skid floor wax. Do not have throw rugs and other things on the floor that can make you trip. What can I do with my stairs? Do not leave any items on the stairs. Make sure that there are handrails on both sides of the stairs and use them. Fix handrails that are broken or loose. Make sure that handrails are as long as the stairways. Check any carpeting to make sure that it is firmly attached to the stairs. Fix any carpet that  is loose or worn. Avoid having throw rugs at the top or bottom of the stairs. If you do have throw rugs, attach them to the floor with carpet tape. Make sure that you have a light switch at the top of the stairs and the bottom of the stairs. If you do not have them, ask someone to add them for you. What else can I do to help prevent falls? Wear shoes that: Do not have high heels. Have rubber bottoms. Are comfortable and fit you well. Are closed at the toe. Do not wear sandals. If you use a stepladder: Make sure that it is fully opened. Do not climb a closed stepladder. Make sure that  both sides of the stepladder are locked into place. Ask someone to hold it for you, if possible. Clearly mark and make sure that you can see: Any grab bars or handrails. First and last steps. Where the edge of each step is. Use tools that help you move around (mobility aids) if they are needed. These include: Canes. Walkers. Scooters. Crutches. Turn on the lights when you go into a dark area. Replace any light bulbs as soon as they burn out. Set up your furniture so you have a clear path. Avoid moving your furniture around. If any of your floors are uneven, fix them. If there are any pets around you, be aware of where they are. Review your medicines with your doctor. Some medicines can make you feel dizzy. This can increase your chance of falling. Ask your doctor what other things that you can do to help prevent falls. This information is not intended to replace advice given to you by your health care provider. Make sure you discuss any questions you have with your health care provider. Document Released: 10/20/2008 Document Revised: 06/01/2015 Document Reviewed: 01/28/2014 Elsevier Interactive Patient Education  2017 [employer].

## 2020-11-10 ENCOUNTER — Ambulatory Visit (INDEPENDENT_AMBULATORY_CARE_PROVIDER_SITE_OTHER): Payer: Medicare HMO | Admitting: Family Medicine

## 2020-11-10 ENCOUNTER — Encounter: Payer: Medicare HMO | Admitting: Family Medicine

## 2020-11-10 ENCOUNTER — Encounter: Payer: Self-pay | Admitting: Family Medicine

## 2020-11-10 ENCOUNTER — Ambulatory Visit: Payer: Medicare HMO

## 2020-11-10 VITALS — BP 130/68 | HR 63 | Temp 98.2°F | Resp 20 | Ht 65.0 in | Wt 220.8 lb

## 2020-11-10 DIAGNOSIS — E559 Vitamin D deficiency, unspecified: Secondary | ICD-10-CM

## 2020-11-10 DIAGNOSIS — I10 Essential (primary) hypertension: Secondary | ICD-10-CM | POA: Diagnosis not present

## 2020-11-10 DIAGNOSIS — Z0000 Encounter for general adult medical examination without abnormal findings: Secondary | ICD-10-CM | POA: Diagnosis not present

## 2020-11-10 DIAGNOSIS — Z23 Encounter for immunization: Secondary | ICD-10-CM

## 2020-11-10 DIAGNOSIS — E785 Hyperlipidemia, unspecified: Secondary | ICD-10-CM | POA: Diagnosis not present

## 2020-11-10 DIAGNOSIS — R413 Other amnesia: Secondary | ICD-10-CM | POA: Diagnosis not present

## 2020-11-10 LAB — COMPREHENSIVE METABOLIC PANEL
ALT: 17 U/L (ref 0–35)
AST: 16 U/L (ref 0–37)
Albumin: 4.4 g/dL (ref 3.5–5.2)
Alkaline Phosphatase: 63 U/L (ref 39–117)
BUN: 20 mg/dL (ref 6–23)
CO2: 30 mEq/L (ref 19–32)
Calcium: 10.1 mg/dL (ref 8.4–10.5)
Chloride: 100 mEq/L (ref 96–112)
Creatinine, Ser: 0.87 mg/dL (ref 0.40–1.20)
GFR: 67.97 mL/min (ref >60.00)
Glucose, Bld: 97 mg/dL (ref 70–99)
Potassium: 4.6 mEq/L (ref 3.5–5.1)
Sodium: 139 mEq/L (ref 135–145)
Total Bilirubin: 0.4 mg/dL (ref 0.2–1.2)
Total Protein: 7.1 g/dL (ref 6.0–8.3)

## 2020-11-10 LAB — CBC WITH DIFFERENTIAL/PLATELET
Basophils Absolute: 0.1 10*3/uL (ref 0.0–0.1)
Basophils Relative: 0.8 % (ref 0.0–3.0)
Eosinophils Absolute: 0.3 10*3/uL (ref 0.0–0.7)
Eosinophils Relative: 2.6 % (ref 0.0–5.0)
HCT: 40.6 % (ref 36.0–46.0)
Hemoglobin: 13.5 g/dL (ref 12.0–15.0)
Lymphocytes Relative: 28.1 % (ref 12.0–46.0)
Lymphs Abs: 2.8 10*3/uL (ref 0.7–4.0)
MCHC: 33.3 g/dL (ref 30.0–36.0)
MCV: 100.1 fl — ABNORMAL HIGH (ref 78.0–100.0)
Monocytes Absolute: 0.7 10*3/uL (ref 0.1–1.0)
Monocytes Relative: 7.2 % (ref 3.0–12.0)
Neutro Abs: 6.1 10*3/uL (ref 1.4–7.7)
Neutrophils Relative %: 61.3 % (ref 43.0–77.0)
Platelets: 339 10*3/uL (ref 150.0–400.0)
RBC: 4.06 Mil/uL (ref 3.87–5.11)
RDW: 14.1 % (ref 11.5–15.5)
WBC: 9.9 10*3/uL (ref 4.0–10.5)

## 2020-11-10 LAB — VITAMIN D 25 HYDROXY (VIT D DEFICIENCY, FRACTURES): VITD: 58.86 ng/mL (ref 30.00–100.00)

## 2020-11-10 LAB — LIPID PANEL
Cholesterol: 145 mg/dL (ref 0–200)
HDL: 51.1 mg/dL (ref >39.00)
LDL Cholesterol: 68 mg/dL (ref 0–99)
NonHDL: 93.54
Total CHOL/HDL Ratio: 3
Triglycerides: 129 mg/dL (ref 0.0–149.0)
VLDL: 25.8 mg/dL (ref 0.0–40.0)

## 2020-11-10 NOTE — Assessment & Plan Note (Signed)
Encourage heart healthy diet such as MIND or DASH diet, increase exercise, avoid trans fats, simple carbohydrates and processed foods, consider a krill or fish or flaxseed oil cap daily.  °

## 2020-11-10 NOTE — Assessment & Plan Note (Signed)
ghm utd Check labs  See avs Packet for living will and POA given to pt

## 2020-11-10 NOTE — Patient Instructions (Signed)
Preventive Care 40 Years and Older, Female Preventive care refers to lifestyle choices and visits with your health care provider that can promote health and wellness. Preventive care visits are also called wellness exams. What can I expect for my preventive care visit? Counseling Your health care provider may ask you questions about your: Medical history, including: Past medical problems. Family medical history. Pregnancy and menstrual history. History of falls. Current health, including: Memory and ability to understand (cognition). Emotional well-being. Home life and relationship well-being. Sexual activity and sexual health. Lifestyle, including: Alcohol, nicotine or tobacco, and drug use. Access to firearms. Diet, exercise, and sleep habits. Work and work Statistician. Sunscreen use. Safety issues such as seatbelt and bike helmet use. Physical exam Your health care provider will check your: Height and weight. These may be used to calculate your BMI (body mass index). BMI is a measurement that tells if you are at a healthy weight. Waist circumference. This measures the distance around your waistline. This measurement also tells if you are at a healthy weight and may help predict your risk of certain diseases, such as type 2 diabetes and high blood pressure. Heart rate and blood pressure. Body temperature. Skin for abnormal spots. What immunizations do I need? Vaccines are usually given at various ages, according to a schedule. Your health care provider will recommend vaccines for you based on your age, medical history, and lifestyle or other factors, such as travel or where you work. What tests do I need? Screening Your health care provider may recommend screening tests for certain conditions. This may include: Lipid and cholesterol levels. Hepatitis C test. Hepatitis B test. HIV (human immunodeficiency virus) test. STI (sexually transmitted infection) testing, if you are at  risk. Lung cancer screening. Colorectal cancer screening. Diabetes screening. This is done by checking your blood sugar (glucose) after you have not eaten for a while (fasting). Mammogram. Talk with your health care provider about how often you should have regular mammograms. BRCA-related cancer screening. This may be done if you have a family history of breast, ovarian, tubal, or peritoneal cancers. Bone density scan. This is done to screen for osteoporosis. Talk with your health care provider about your test results, treatment options, and if necessary, the need for more tests. Follow these instructions at home: Eating and drinking  Eat a diet that includes fresh fruits and vegetables, whole grains, lean protein, and low-fat dairy products. Limit your intake of foods with high amounts of sugar, saturated fats, and salt. Take vitamin and mineral supplements as recommended by your health care provider. Do not drink alcohol if your health care provider tells you not to drink. If you drink alcohol: Limit how much you have to 0-1 drink a day. Know how much alcohol is in your drink. In the [place] one drink equals one 12 oz bottle of beer (355 mL), one 5 oz glass of wine (148 mL), or one 1 oz glass of hard liquor (44 mL). Lifestyle Brush your teeth every morning and night with fluoride toothpaste. Floss one time each day. Exercise for at least 30 minutes 5 or more days each week. Do not use any products that contain nicotine or tobacco. These products include cigarettes, chewing tobacco, and vaping devices, such as e-cigarettes. If you need help quitting, ask your health care provider. Do not use drugs. If you are sexually active, practice safe sex. Use a condom or other form of protection in order to prevent STIs. Take aspirin only as told by your  health care provider. Make sure that you understand how much to take and what form to take. Work with your health care provider to find out whether it  is safe and beneficial for you to take aspirin daily. Ask your health care provider if you need to take a cholesterol-lowering medicine (statin). Find healthy ways to manage stress, such as: Meditation, yoga, or listening to music. Journaling. Talking to a trusted person. Spending time with friends and family. Minimize exposure to UV radiation to reduce your risk of skin cancer. Safety Always wear your seat belt while driving or riding in a vehicle. Do not drive: If you have been drinking alcohol. Do not ride with someone who has been drinking. When you are tired or distracted. While texting. If you have been using any mind-altering substances or drugs. Wear a helmet and other protective equipment during sports activities. If you have firearms in your house, make sure you follow all gun safety procedures. What's next? Visit your health care provider once a year for an annual wellness visit. Ask your health care provider how often you should have your eyes and teeth checked. Stay up to date on all vaccines. This information is not intended to replace advice given to you by your health care provider. Make sure you discuss any questions you have with your health care provider. Document Revised: 06/21/2020 Document Reviewed: 06/21/2020 Elsevier Patient Education  [place].

## 2020-11-10 NOTE — Assessment & Plan Note (Signed)
Refer to neuropsych 

## 2020-11-10 NOTE — Assessment & Plan Note (Signed)
Well controlled, no changes to meds. Encouraged heart healthy diet such as the DASH diet and exercise as tolerated.  °

## 2020-11-10 NOTE — Progress Notes (Signed)
Subjective:   By signing my name below, I, Zite Okoli, attest that this documentation has been prepared under the direction and in the presence of  Roma Schanz R DO. 11/10/2020      Patient ID: Jasmine Mclaughlin, female    DOB: 14-Feb-1950, 70 y.o.   MRN: [id number]  Chief Complaint  Patient presents with   Annual Exam    Pt states fasting     HPI Patient is in today for a comprehensive physical exam.  She reports that 2/3 weeks ago, she experienced an episode of vertigo for 2 days and is requesting a medication for it. Also mentions that she has shorter episodes for 10 minutes every other day.  She also reports she has been experiencing memory loss. At her medicare visit, she had a memory test done and did well on it. She describes it as having a hard time finding the words to say. She says she sees it in her brain but cannot find the exact word to say.  She has a follow-up with her neurologist in December 2022. She was referred to him after a series of falls. She has had 3 MRIs and they did not find a cause.   She is experiencing pain and tingling in her feet and mentions she does have 100% sensation in her feet. She has started walking with a wider stance and feels sensation when she walks. She is managing the pain with 300 mg gabapentin and 4 mg Zanaflex.  She denies fever, hearing loss, ear pain,congestion, sinus pain, sore throat, eye pain, chest pain, palpitations, cough, shortness of breath, wheezing, nausea. vomiting, diarrhea, constipation, blood in stool, dysuria,frequency, hematuria and headaches.   She had Covid-19 late August 2022.  She will receive the flu vaccine today. She has 3 Pfizer Covid-19 vaccines at this time. She is not UTD on the shingles vaccine.  She is UTD on vision and dental care.   There has been no changes in her medical and surgical history. There has been no changes in the family history.   She does not smoke tobacco.  Past Medical History:   Diagnosis Date   Allergy    Anxiety    Arthritis    degenerative back, joint disturbance "everywhere "   Asthma    Chest tightness 10/27/2013   Jasmine polyps    COPD (chronic obstructive pulmonary disease) (HCC)    Generalized anxiety disorder 11/13/2012   Genital warts    Obesity (BMI 30-39.9) 10/14/2012   Osteoporosis    Pneumonia    Shortness of breath    pt./ reports that she is out of shape   Sinusitis, acute 10/27/2013   Tobacco abuse 09/28/2010   Wheezing 10/27/2013    Past Surgical History:  Procedure Laterality Date   ANTERIOR FUSION LUMBAR SPINE  05/06/2012   ANTERIOR LAT LUMBAR FUSION N/A 05/06/2012   Procedure: ANTERIOR LATERAL LUMBAR FUSION 3 LEVELS;  Surgeon: Sinclair Ship, MD;  Location: [facility];  Service: Orthopedics;  Laterality: N/A;  Lateral interbody fusion, lumbar 2-3,lumbar 3-4, lumbar 4-5 with instrumentation and allograft.   COLONOSCOPY     COLONOSCOPY WITH PROPOFOL N/A 12/15/2017   Procedure: COLONOSCOPY WITH PROPOFOL;  Surgeon: Rush Landmark Telford Nab., MD;  Location: [place];  Service: Gastroenterology;  Laterality: N/A;   COLONOSCOPY WITH PROPOFOL N/A 07/08/2018   Procedure: COLONOSCOPY WITH PROPOFOL;  Surgeon: Rush Landmark Telford Nab., MD;  Location: [facility];  Service: Gastroenterology;  Laterality: N/A;   COLONOSCOPY WITH PROPOFOL  N/A 09/01/2019   Procedure: COLONOSCOPY WITH PROPOFOL;  Surgeon: Rush Landmark Telford Nab., MD;  Location: [facility];  Service: Gastroenterology;  Laterality: N/A;   KNEE ARTHROSCOPY Right     R knee 01/2012, for ligament tears   POLYPECTOMY  07/08/2018   Procedure: POLYPECTOMY;  Surgeon: Mansouraty, Telford Nab., MD;  Location: [facility];  Service: Gastroenterology;;   POLYPECTOMY  09/01/2019   Procedure: POLYPECTOMY;  Surgeon: Irving Copas., MD;  Location: [facility];  Service: Gastroenterology;;   TONSILLECTOMY     VAGINAL DELIVERY     x1    Family History  Problem Relation Age of Onset    Arthritis Mother    Arthritis Father    Stroke Father    Jasmine cancer Father 42   Heart disease Father 56       chf   Breast cancer Maternal Grandmother    Asthma Maternal Grandmother    Asthma Daughter    Esophageal cancer Neg Hx    Ulcerative colitis Neg Hx    Stomach cancer Neg Hx     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  Tobacco Use   Smoking status: Former    Packs/day: 0.50    Years: 55.00    Pack years: 27.50    Types: Cigarettes   Smokeless tobacco: Never  Vaping Use   Vaping Use: Never used  Substance and Sexual Activity   Alcohol use: Yes    Alcohol/week: 0.0 standard drinks    Comment: "sporatic"- wine, beer, mixed drink   Drug use: No   Sexual activity: Not Currently    Partners: Male  Other Topics Concern   Not on file  Social History Narrative   1 caffeine drinks daily   No exercise   Left handed   One story home   Social Determinants of Health   Financial Resource Strain: Low Risk    Difficulty of Paying Living Expenses: Not hard at all  Food Insecurity: No Food Insecurity   Worried About Charity fundraiser in the Last Year: Never true   Ran Out of Food in the Last Year: Never true  Transportation Needs: No Transportation Needs   Lack of Transportation (Medical): No   Lack of Transportation (Non-Medical): No  Physical Activity: Insufficiently Active   Days of Exercise per Week: 7 days   Minutes of Exercise per Session: 10 min  Stress: No Stress Concern Present   Feeling of Stress : Not at all  Social Connections: Not on file  Intimate Partner Violence: Not At Risk   Fear of Current or Ex-Partner: No   Emotionally Abused: No   Physically Abused: No   Sexually Abused: No    Outpatient  Medications Prior to Visit  Medication Sig Dispense Refill   acetaminophen (TYLENOL) 500 MG tablet Take 1,000 mg by mouth 2 (two) times daily.     albuterol (VENTOLIN HFA) 108 (90 Base) MCG/ACT inhaler Inhale 2 puffs into the lungs every 6 (six) hours as needed for wheezing or shortness of breath. 1 each 5   alendronate (FOSAMAX) 70 MG tablet Take 1 tablet (70 mg total) by mouth once a week. TAKE WITH A FULL GLASS OF WATER ON  AN EMPTY STOMACH. 12 tablet 3   aspirin EC 81 MG tablet Take 81 mg by mouth daily. Swallow whole.     benzonatate (TESSALON) 200 MG capsule Take 1 capsule (200 mg total) by mouth 2 (two) times daily as needed for cough. 20 capsule 0   Budeson-Glycopyrrol-Formoterol (BREZTRI AEROSPHERE) 160-9-4.8 MCG/ACT AERO Inhale 2 puffs into the lungs 2 (two) times daily. 10.7 g 11   chlorpheniramine (CHLOR-TRIMETON) 4 MG tablet Take 4 mg by mouth daily as needed for allergies.     citalopram (CELEXA) 20 MG tablet TAKE 1 TABLET BY MOUTH EVERY DAY 90 tablet 1   diphenhydrAMINE (SOMINEX) 25 MG tablet Take 25 mg by mouth at bedtime.     fluticasone (FLONASE) 50 MCG/ACT nasal spray Place 2 sprays into both nostrils daily. 16 g 6   gabapentin (NEURONTIN) 100 MG capsule Take 100 mg in the AM 30 capsule 3   gabapentin (NEURONTIN) 300 MG capsule Take 1 capsule (300 mg total) by mouth at bedtime. 30 capsule 3   simvastatin (ZOCOR) 20 MG tablet Take 1 tablet (20 mg total) by mouth at bedtime. Take 1 tablet (20 mg total) by mouth at bedtime. 90 tablet 1   tiZANidine (ZANAFLEX) 4 MG capsule Take 1 capsule (4 mg total) by mouth 3 (three) times daily as needed for muscle spasms. 30 capsule 1   vitamin B-12 (CYANOCOBALAMIN) 1000 MCG tablet Take 1,000 mcg by mouth daily.     Vitamin D, Ergocalciferol, (DRISDOL) 1.25 MG (50000 UNIT) CAPS capsule TAKE 1 CAPSULE (50,000 UNITS TOTAL) BY MOUTH EVERY 7 (SEVEN) DAYS. 12 capsule 1   hydrochlorothiazide (MICROZIDE) 12.5 MG capsule Take 1 capsule (12.5 mg total) by  mouth daily. 90 capsule 3   nitroGLYCERIN (NITROSTAT) 0.4 MG SL tablet Place 1 tablet (0.4 mg total) under the tongue every 5 (five) minutes as needed for chest pain. 90 tablet 3   azithromycin (ZITHROMAX Z-PAK) 250 MG tablet As directed 6 each 0   predniSONE (DELTASONE) 10 MG tablet TAKE 3 TABLETS PO QD FOR 3 DAYS THEN TAKE 2 TABLETS PO QD FOR 3 DAYS THEN TAKE 1 TABLET PO QD FOR 3 DAYS THEN TAKE 1/2 TAB PO QD FOR 3 DAYS 20 tablet 0   No facility-administered medications prior to visit.    No Known Allergies  Review of Systems  Constitutional:  Negative for fever.  HENT:  Negative for congestion, ear pain, hearing loss, sinus pain and sore throat.   Eyes:  Negative for blurred vision and pain.  Respiratory:  Negative for cough, sputum production, shortness of breath and wheezing.   Cardiovascular:  Negative for chest pain and palpitations.  Gastrointestinal:  Negative for blood in stool, constipation, diarrhea, nausea and vomiting.  Genitourinary:  Negative for dysuria, frequency, hematuria and urgency.  Musculoskeletal:  Negative for back pain, falls and myalgias.       (+) foot pain  Neurological:  Positive for tingling (feet). Negative for dizziness, sensory change, loss of consciousness, weakness and headaches.       (+) vertigo  Endo/Heme/Allergies:  Negative for environmental allergies. Does not bruise/bleed easily.  Psychiatric/Behavioral:  Positive for memory loss. Negative for depression and suicidal ideas. The patient is not nervous/anxious and does not have insomnia.       Objective:    Physical Exam Constitutional:      General: She is not in acute distress.    Appearance: Normal appearance. She is not ill-appearing.  HENT:     Head: Normocephalic  and atraumatic.     Right Ear: External ear normal.     Left Ear: External ear normal.  Eyes:     Extraocular Movements: Extraocular movements intact.     Pupils: Pupils are equal, round, and reactive to light.   Cardiovascular:     Rate and Rhythm: Normal rate and regular rhythm.     Pulses: Normal pulses.     Heart sounds: Normal heart sounds. No murmur heard.   No gallop.  Pulmonary:     Effort: Pulmonary effort is normal. No respiratory distress.     Breath sounds: Normal breath sounds. No wheezing, rhonchi or rales.  Abdominal:     General: Bowel sounds are normal. There is no distension.     Palpations: Abdomen is soft. There is no mass.     Tenderness: There is no abdominal tenderness. There is no guarding or rebound.     Hernia: No hernia is present.  Musculoskeletal:     Cervical back: Normal range of motion and neck supple.  Lymphadenopathy:     Cervical: No cervical adenopathy.  Skin:    General: Skin is warm and dry.  Neurological:     Mental Status: She is alert and oriented to person, place, and time.  Psychiatric:        Behavior: Behavior normal.    BP 130/68 (BP Location: Left Arm, Patient Position: Sitting, Cuff Size: Large)   Pulse 63   Temp 98.2 F (36.8 C) (Oral)   Resp 20   Ht 5' 5"  (1.651 m)   Wt 220 lb 12.8 oz (100.2 kg)   SpO2 97%   BMI 36.74 kg/m  Wt Readings from Last 3 Encounters:  11/10/20 220 lb 12.8 oz (100.2 kg)  11/09/20 215 lb (97.5 kg)  08/08/20 215 lb 9.6 oz (97.8 kg)    Diabetic Foot Exam - Simple   No data filed    Lab Results  Component Value Date   WBC 8.4 12/01/2019   HGB 13.8 12/01/2019   HCT 40.8 12/01/2019   PLT 335.0 12/01/2019   GLUCOSE 95 08/08/2020   CHOL 151 08/08/2020   TRIG 181.0 (H) 08/08/2020   HDL 51.50 08/08/2020   LDLDIRECT 135.0 12/23/2019   LDLCALC 63 08/08/2020   ALT 17 08/08/2020   AST 19 08/08/2020   NA 136 08/08/2020   K 4.7 08/08/2020   CL 99 08/08/2020   CREATININE 0.75 08/08/2020   BUN 20 08/08/2020   CO2 28 08/08/2020   TSH 0.95 09/23/2019   INR 1.0 11/14/2017    Lab Results  Component Value Date   TSH 0.95 09/23/2019   Lab Results  Component Value Date   WBC 8.4 12/01/2019   HGB  13.8 12/01/2019   HCT 40.8 12/01/2019   MCV 99.3 12/01/2019   PLT 335.0 12/01/2019   Lab Results  Component Value Date   NA 136 08/08/2020   K 4.7 08/08/2020   CO2 28 08/08/2020   GLUCOSE 95 08/08/2020   BUN 20 08/08/2020   CREATININE 0.75 08/08/2020   BILITOT 0.5 08/08/2020   ALKPHOS 70 08/08/2020   AST 19 08/08/2020   ALT 17 08/08/2020   PROT 7.3 08/08/2020   ALBUMIN 4.4 08/08/2020   CALCIUM 9.8 08/08/2020   EGFR 77 03/13/2020   GFR 81.37 08/08/2020   Lab Results  Component Value Date   CHOL 151 08/08/2020   Lab Results  Component Value Date   HDL 51.50 08/08/2020   Lab Results  Component Value  Date   LDLCALC 63 08/08/2020   Lab Results  Component Value Date   TRIG 181.0 (H) 08/08/2020   Lab Results  Component Value Date   CHOLHDL 3 08/08/2020   No results found for: HGBA1C      Mammogram: Last completed on 11/29/2019. Results were normal. Repeat in 1 year. Dexa: Last completed on 12/08/2018. Patient is considered osteopenic according to the [facility]. Repeat in 2 years. Ordered. Colonoscopy: Last completed on 09/01/2019. Hemorrhoids were found in rectal exam and diverticulosis was found in the recto-sigmoid, sigmoid, descending and ascending Jasmine. A polyp was found in the descending Jasmine and was resected and retrieved. Non-bleeding external and internal hemorrhoids. Repeat in 3 years. Pap Smear: Last completed on 08/29/2017. Results were normal. Assessment & Plan:   Problem List Items Addressed This Visit       Unprioritized   Vitamin D deficiency   Relevant Orders   VITAMIN D 25 Hydroxy (Vit-D Deficiency, Fractures)   Hyperlipidemia    Encourage heart healthy diet such as MIND or DASH diet, increase exercise, avoid trans fats, simple carbohydrates and processed foods, consider a krill or fish or flaxseed oil cap daily.       Relevant Orders   Comprehensive metabolic panel   CBC with Differential/Platelet   Lipid panel   Memory  loss    Refer to neuropsych      Relevant Orders   Ambulatory referral to Neuropsychology   Preventative health care - Primary    ghm utd Check labs  See avs Packet for living will and POA given to pt       Primary hypertension    Well controlled, no changes to meds. Encouraged heart healthy diet such as the DASH diet and exercise as tolerated.       Relevant Orders   Comprehensive metabolic panel   CBC with Differential/Platelet   Lipid panel   Other Visit Diagnoses     Need for influenza vaccination       Relevant Orders   Flu Vaccine QUAD High Dose(Fluad) (Completed)       No orders of the defined types were placed in this encounter.   I,Zite Okoli,acting as a Education administrator for [employer], DO.,have documented all relevant documentation on the behalf of Ann Held, DO,as directed by  Ann Held, DO while in the presence of Ann Held, DO.   I,  Ann Held DO., personally preformed the services described in this documentation.  All medical record entries made by the scribe were at my direction and in my presence.  I have reviewed the chart and discharge instructions (if applicable) and agree that the record reflects my personal performance and is accurate and complete. 11/10/2020

## 2020-11-13 ENCOUNTER — Encounter: Payer: Self-pay | Admitting: Psychology

## 2020-11-13 ENCOUNTER — Other Ambulatory Visit: Payer: Self-pay | Admitting: Family Medicine

## 2020-11-13 DIAGNOSIS — Z1231 Encounter for screening mammogram for malignant neoplasm of breast: Secondary | ICD-10-CM

## 2020-11-15 ENCOUNTER — Encounter: Payer: Self-pay | Admitting: Family Medicine

## 2020-11-15 NOTE — Telephone Encounter (Signed)
LVM for pt to CB regarding results.  

## 2020-11-20 NOTE — Telephone Encounter (Signed)
Returning call from last week.  Please call patient at [phone].

## 2020-11-27 ENCOUNTER — Encounter: Payer: Self-pay | Admitting: Cardiology

## 2020-11-28 ENCOUNTER — Encounter: Payer: Self-pay | Admitting: Cardiology

## 2020-11-28 ENCOUNTER — Ambulatory Visit: Payer: Medicare HMO | Admitting: Cardiology

## 2020-11-28 ENCOUNTER — Ambulatory Visit (INDEPENDENT_AMBULATORY_CARE_PROVIDER_SITE_OTHER): Payer: Medicare HMO

## 2020-11-28 ENCOUNTER — Other Ambulatory Visit: Payer: Self-pay

## 2020-11-28 VITALS — BP 138/74 | HR 73 | Ht 65.0 in | Wt 223.2 lb

## 2020-11-28 DIAGNOSIS — I2510 Atherosclerotic heart disease of native coronary artery without angina pectoris: Secondary | ICD-10-CM | POA: Diagnosis not present

## 2020-11-28 DIAGNOSIS — R002 Palpitations: Secondary | ICD-10-CM

## 2020-11-28 DIAGNOSIS — I10 Essential (primary) hypertension: Secondary | ICD-10-CM | POA: Diagnosis not present

## 2020-11-28 DIAGNOSIS — Z720 Tobacco use: Secondary | ICD-10-CM

## 2020-11-28 DIAGNOSIS — E782 Mixed hyperlipidemia: Secondary | ICD-10-CM | POA: Diagnosis not present

## 2020-11-28 HISTORY — DX: Palpitations: R00.2

## 2020-11-28 MED ORDER — HYDROCHLOROTHIAZIDE 12.5 MG PO CAPS: 12.5000 mg | ORAL_CAPSULE | Freq: Every day | ORAL | 3 refills | Status: DC

## 2020-11-28 MED ORDER — ISOSORBIDE MONONITRATE ER 30 MG PO TB24: 15.0000 mg | ORAL_TABLET | Freq: Every day | ORAL | 3 refills | Status: DC

## 2020-11-28 MED ORDER — DILTIAZEM HCL 30 MG PO TABS: 30.0000 mg | ORAL_TABLET | Freq: Three times a day (TID) | ORAL | 3 refills | Status: DC | PRN

## 2020-11-28 NOTE — Progress Notes (Unsigned)
Enrolled patient for a 14 day Zio XT  monitor to be mailed to patients home  °

## 2020-11-28 NOTE — Progress Notes (Signed)
Cardiology Office Note:    Date:  11/28/2020   ID:  Jasmine Mclaughlin, DOB 10-Jul-1950, MRN [id number]  PCP:  Carollee Herter, Alferd Apa, DO  Cardiologist:  Berniece Salines, DO  Electrophysiologist:  None   Referring MD: Carollee Herter, Alferd Apa, *   " I am having a lot of palpitations"  History of Present Illness:    Jasmine Mclaughlin is a 70 y.o. female with a hx of mild coronary artery disease which was seen on her coronary CTA, COPD, current smoker, anxiety here today for follow-up visit.  I saw the patient in February 2022 she still was experiencing chest discomfort despite negative nuclear stress test we will proceed with a coronary CTA.  Interim the patient was able to get a coronary CTA done which showed mild coronary artery disease.  At that time she was also hypertensive I started patient on hydrochlorothiazide 12.5 mg daily.  I saw the patient on May 26, 2020 we talked about her testing results.  As well as we talked about smoking cessation.  Since I saw the patient she tells me that she has had significant palpitations.  He is concerned about this.  But she states is abrupt fast heartbeat which last for minutes at a time.  Heart rates going up into the 130s.  Nothing makes it better or worse.  Past Medical History:  Diagnosis Date   Allergy    Anxiety    Arthritis    degenerative back, joint disturbance "everywhere "   Asthma    Chest tightness 10/27/2013   Colon polyps    COPD (chronic obstructive pulmonary disease) (HCC)    Generalized anxiety disorder 11/13/2012   Genital warts    Obesity (BMI 30-39.9) 10/14/2012   Osteoporosis    Pneumonia    Shortness of breath    pt./ reports that she is out of shape   Sinusitis, acute 10/27/2013   Tobacco abuse 09/28/2010   Wheezing 10/27/2013    Past Surgical History:  Procedure Laterality Date   ANTERIOR FUSION LUMBAR SPINE  05/06/2012   ANTERIOR LAT LUMBAR FUSION N/A 05/06/2012   Procedure: ANTERIOR LATERAL LUMBAR FUSION 3 LEVELS;  Surgeon:  Sinclair Ship, MD;  Location: [facility];  Service: Orthopedics;  Laterality: N/A;  Lateral interbody fusion, lumbar 2-3,lumbar 3-4, lumbar 4-5 with instrumentation and allograft.   COLONOSCOPY     COLONOSCOPY WITH PROPOFOL N/A 12/15/2017   Procedure: COLONOSCOPY WITH PROPOFOL;  Surgeon: Rush Landmark Telford Nab., MD;  Location: [facility];  Service: Gastroenterology;  Laterality: N/A;   COLONOSCOPY WITH PROPOFOL N/A 07/08/2018   Procedure: COLONOSCOPY WITH PROPOFOL;  Surgeon: Rush Landmark Telford Nab., MD;  Location: [facility];  Service: Gastroenterology;  Laterality: N/A;   COLONOSCOPY WITH PROPOFOL N/A 09/01/2019   Procedure: COLONOSCOPY WITH PROPOFOL;  Surgeon: Rush Landmark Telford Nab., MD;  Location: [facility];  Service: Gastroenterology;  Laterality: N/A;   KNEE ARTHROSCOPY Right     R knee 01/2012, for ligament tears   POLYPECTOMY  07/08/2018   Procedure: POLYPECTOMY;  Surgeon: Mansouraty, Telford Nab., MD;  Location: [facility];  Service: Gastroenterology;;   POLYPECTOMY  09/01/2019   Procedure: POLYPECTOMY;  Surgeon: Irving Copas., MD;  Location: [facility];  Service: Gastroenterology;;   TONSILLECTOMY     VAGINAL DELIVERY     x1    Current Medications: Current Meds  Medication Sig   acetaminophen (TYLENOL) 500 MG tablet Take 1,000 mg by mouth 2 (two) times daily.   acetaminophen (TYLENOL) 650 MG CR  tablet Take 650 mg by mouth 2 times daily at 12 noon and 4 pm. Patient takes 2 tablets twice a day   albuterol (VENTOLIN HFA) 108 (90 Base) MCG/ACT inhaler Inhale 2 puffs into the lungs every 6 (six) hours as needed for wheezing or shortness of breath.   alendronate (FOSAMAX) 70 MG tablet Take 1 tablet (70 mg total) by mouth once a week. TAKE WITH A FULL GLASS OF WATER ON AN EMPTY STOMACH.   aspirin EC 81 MG tablet Take 81 mg by mouth daily. Swallow whole.   benzonatate (TESSALON) 200 MG capsule Take 1 capsule (200 mg total) by mouth 2 (two) times daily as needed for cough.    Budeson-Glycopyrrol-Formoterol (BREZTRI AEROSPHERE) 160-9-4.8 MCG/ACT AERO Inhale 2 puffs into the lungs 2 (two) times daily.   chlorpheniramine (CHLOR-TRIMETON) 4 MG tablet Take 4 mg by mouth daily as needed for allergies.   citalopram (CELEXA) 20 MG tablet TAKE 1 TABLET BY MOUTH EVERY DAY   diltiazem (CARDIZEM) 30 MG tablet Take 1 tablet (30 mg total) by mouth every 8 (eight) hours as needed (If heart rate is greater then 130 beats per minute).   diphenhydrAMINE (SOMINEX) 25 MG tablet Take 25 mg by mouth at bedtime.   fluticasone (FLONASE) 50 MCG/ACT nasal spray Place 2 sprays into both nostrils daily.   gabapentin (NEURONTIN) 100 MG capsule Take 100 mg in the AM   gabapentin (NEURONTIN) 300 MG capsule Take 1 capsule (300 mg total) by mouth at bedtime.   isosorbide mononitrate (IMDUR) 30 MG 24 hr tablet Take 0.5 tablets (15 mg total) by mouth daily.   nitroGLYCERIN (NITROSTAT) 0.4 MG SL tablet Place 1 tablet (0.4 mg total) under the tongue every 5 (five) minutes as needed for chest pain.   simvastatin (ZOCOR) 20 MG tablet Take 1 tablet (20 mg total) by mouth at bedtime. Take 1 tablet (20 mg total) by mouth at bedtime.   tiZANidine (ZANAFLEX) 4 MG capsule Take 1 capsule (4 mg total) by mouth 3 (three) times daily as needed for muscle spasms.   vitamin B-12 (CYANOCOBALAMIN) 1000 MCG tablet Take 1,000 mcg by mouth daily.   [DISCONTINUED] hydrochlorothiazide (MICROZIDE) 12.5 MG capsule Take 1 capsule (12.5 mg total) by mouth daily.     Allergies:   Patient has no known allergies.   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  Tobacco Use   Smoking status: Former    Packs/day: 0.50    Years: 55.00    Pack years: 27.50    Types: Cigarettes   Smokeless  tobacco: Never  Vaping Use   Vaping Use: Never used  Substance and Sexual Activity   Alcohol use: Yes    Alcohol/week: 0.0 standard drinks    Comment: "sporatic"- wine, beer, mixed drink   Drug use: No   Sexual activity: Not Currently    Partners: Male  Other Topics Concern   Not on file  Social History Narrative   1 caffeine drinks daily   No exercise   Left handed   One story home   Social Determinants of Health   Financial Resource Strain: Low Risk    Difficulty of Paying Living Expenses: Not hard at all  Food Insecurity: No Food Insecurity   Worried About Charity fundraiser in the Last Year: Never true   Ran Out of Food in the Last Year: Never true  Transportation Needs: No Transportation Needs   Lack of Transportation (Medical): No   Lack of Transportation (Non-Medical): No  Physical Activity: Insufficiently Active   Days of Exercise per Week: 7 days   Minutes of Exercise per Session: 10 min  Stress: No Stress Concern Present   Feeling of Stress : Not at all  Social Connections: Not on file     Family History: The patient's family history includes Arthritis in her father and mother; Asthma in her daughter and maternal grandmother; Breast cancer in her maternal grandmother; Colon cancer (age of onset: 31) in her father; Heart disease (age of onset: 91) in her father; Stroke in her father. There is no history of Esophageal cancer, Ulcerative colitis, or Stomach cancer.  ROS:   Review of Systems  Constitution: Negative for decreased appetite, fever and weight gain.  HENT: Negative for congestion, ear discharge, hoarse voice and sore throat.   Eyes: Negative for discharge, redness, vision loss in right eye and visual halos.  Cardiovascular: Reports palpitations.  Negative for chest pain, dyspnea on exertion, leg swelling, orthopnea. Respiratory: Negative for cough, hemoptysis, shortness of breath and snoring.   Endocrine: Negative for heat intolerance and polyphagia.   Hematologic/Lymphatic: Negative for bleeding problem. Does not bruise/bleed easily.  Skin: Negative for flushing, nail changes, rash and suspicious lesions.  Musculoskeletal: Negative for arthritis, joint pain, muscle cramps, myalgias, neck pain and stiffness.  Gastrointestinal: Negative for abdominal pain, bowel incontinence, diarrhea and excessive appetite.  Genitourinary: Negative for decreased libido, genital sores and incomplete emptying.  Neurological: Negative for brief paralysis, focal weakness, headaches and loss of balance.  Psychiatric/Behavioral: Negative for altered mental status, depression and suicidal ideas.  Allergic/Immunologic: Negative for HIV exposure and persistent infections.    EKGs/Labs/Other Studies Reviewed:    The following studies were reviewed today:   EKG:  The ekg ordered today demonstrates sinus rhythm, heart rate 73 beats per minute.  Coronary CT done March 21, 2020 Noise artifact is: Moderate, patient motion.   Coronary calcium score is 136, which places the patient in the 80th percentile for age and sex matched control.   Coronary arteries: Normal coronary origins.  Right dominance.   Right Coronary Artery: Minimal atherosclerotic plaque in the proximal RCA, <25% stenosis.   Left Main Coronary Artery: No detectable plaque or stenosis.   Left Anterior Descending Coronary Artery: Minimal mixed atherosclerotic plaque in the proximal and mid LAD, <25% stenosis. There is significant slice misalignment due to patient motion in the mid LAD, cannot comment definitively on mid LAD, however surrounding vessel appears patent. Mild mixed atherosclerotic plaque in the mid-distal LAD, 25-49% stenosis.   Left Circumflex Artery: Minimal mixed atherosclerotic plaque in proximal and mid OM1, <25% stenosis.   Aorta: Normal size, 31 mm at the mid ascending aorta (level of the PA bifurcation) measured double oblique. No calcifications. No dissection.    Aortic Valve: No calcifications.   Other findings:   Normal variant pulmonary vein drainage into the left atrium, left sided pulmonary veins share a common antrum.   Normal left atrial appendage without thrombus.   Normal size of the pulmonary artery.   IMPRESSION: 1. Mild CAD in mid-distal LAD, CADRADS = 2. Motion artifact in mid LAD limits assessment of that segment.   2. Coronary calcium score is 136, which  places the patient in the 80th percentile for age and sex matched control.   3. Normal coronary origin with right dominance.     Nuclear stress test Impression: 2021  1. Normal myocardial perfusion imaging study without evidence of ischemia or infarction.  2. Normal LVEF, 62%. 3. Brief ectopic atrial rhythm (6 beat duration) captured on baseline ECGs. 4. This is a low-risk study.      Echo IMPRESSIONS March 2021  1. Left ventricular ejection fraction, by estimation, is 60 to 65%. The  left ventricle has normal function. The left ventricle has no regional  wall motion abnormalities. There is mild concentric left ventricular  hypertrophy. Left ventricular diastolic  parameters are consistent with Grade II diastolic dysfunction  (pseudonormalization). The average left ventricular global longitudinal  strain is -16.6 %.   2. Right ventricular systolic function is normal. The right ventricular  size is normal. There is normal pulmonary artery systolic pressure.   3. The mitral valve is normal in structure. Trivial mitral valve  regurgitation. No evidence of mitral stenosis.   4. The aortic valve is tricuspid. Aortic valve regurgitation is not  visualized. No aortic stenosis is present.   5. The inferior vena cava is normal in size with greater than 50%  respiratory variability, suggesting right atrial pressure of 3 mmHg.   FINDINGS   Left Ventricle: Left ventricular ejection fraction, by estimation, is 60  to 65%. The left ventricle has normal function. The left  ventricle has no  regional wall motion abnormalities. The average left ventricular global  longitudinal strain is -16.6 %.  The left ventricular internal cavity size was normal in size. There is  mild concentric left ventricular hypertrophy. Left ventricular diastolic  parameters are consistent with Grade II diastolic dysfunction  (pseudonormalization).   Right Ventricle: The right ventricular size is normal. No increase in  right ventricular wall thickness. Right ventricular systolic function is  normal. There is normal pulmonary artery systolic pressure. The tricuspid  regurgitant velocity is 1.30 m/s, and   with an assumed right atrial pressure of 3 mmHg, the estimated right  ventricular systolic pressure is 9.8 mmHg.   Left Atrium: Left atrial size was normal in size.   Right Atrium: Right atrial size was normal in size.   Pericardium: Trivial pericardial effusion is present. The pericardial  effusion is posterior to the left ventricle.   Mitral Valve: The mitral valve is normal in structure. Normal mobility of  the mitral valve leaflets. Trivial mitral valve regurgitation. No evidence  of mitral valve stenosis.   Tricuspid Valve: The tricuspid valve is normal in structure. Tricuspid  valve regurgitation is not demonstrated. No evidence of tricuspid  stenosis.   Aortic Valve: The aortic valve is tricuspid. Aortic valve regurgitation is  not visualized. No aortic stenosis is present.   Pulmonic Valve: The pulmonic valve was normal in structure. Pulmonic valve  regurgitation is not visualized. No evidence of pulmonic stenosis.   Aorta: The aortic root and ascending aorta are structurally normal, with  no evidence of dilitation.   Venous: A normal flow pattern is recorded from the right upper pulmonary  vein. The inferior vena cava is normal in size with greater than 50%  respiratory variability, suggesting right atrial pressure of 3 mmHg.   IAS/Shunts: No atrial level  shunt detected by color flow Doppler.   Recent Labs: 03/13/2020: Magnesium 2.0 11/10/2020: ALT 17; BUN 20; Creatinine, Ser 0.87; Hemoglobin 13.5; Platelets 339.0; Potassium 4.6; Sodium 139  Recent Lipid  Panel    Component Value Date/Time   CHOL 145 11/10/2020 1006   TRIG 129.0 11/10/2020 1006   HDL 51.10 11/10/2020 1006   CHOLHDL 3 11/10/2020 1006   VLDL 25.8 11/10/2020 1006   LDLCALC 68 11/10/2020 1006   LDLCALC 117 (H) 09/23/2019 0952   LDLDIRECT 135.0 12/23/2019 1126    Physical Exam:    VS:  BP 138/74   Pulse 73   Ht 5\' 5"  (1.651 m)   Wt 223 lb 3.2 oz (101.2 kg)   SpO2 96%   BMI 37.14 kg/m     Wt Readings from Last 3 Encounters:  11/28/20 223 lb 3.2 oz (101.2 kg)  11/10/20 220 lb 12.8 oz (100.2 kg)  11/09/20 215 lb (97.5 kg)     GEN: Well nourished, well developed in no acute distress HEENT: Normal NECK: No JVD; No carotid bruits LYMPHATICS: No lymphadenopathy CARDIAC: S1S2 noted,RRR, no murmurs, rubs, gallops RESPIRATORY:  Clear to auscultation without rales, wheezing or rhonchi  ABDOMEN: Soft, non-tender, non-distended, +bowel sounds, no guarding. EXTREMITIES: No edema, No cyanosis, no clubbing MUSCULOSKELETAL:  No deformity  SKIN: Warm and dry NEUROLOGIC:  Alert and oriented x 3, non-focal PSYCHIATRIC:  Normal affect, good insight  ASSESSMENT:    1. Palpitations   2. Primary hypertension   3. Tobacco use   4. Mixed hyperlipidemia   5. Minimal Coronary artery disease involving native coronary artery of native heart, unspecified whether angina present    PLAN:    She is experiencing palpitations.  I would like to place a monitor on the patient to make sure that atrial fibrillation is not playing a role here.  The meantime I have given her Cardizem 30 mg every 8 hours as needed for heart rate greater than 130.  She is having some intermittent chest discomfort though she tells me.  She has been using her nitroglycerin more than usual.  I am going to try the  patient on Imdur 15 mg daily to see if this is going to help.  She only has minimal coronary artery disease but microvascular angina may be playing a role here as well.  Blood pressure is acceptable, continue with current antihypertensive regimen.  Hyperlipidemia - continue with current statin medication.  The patient understands the need to lose weight with diet and exercise. We have discussed specific strategies for this.  Smoking cessation advised.   The patient is in agreement with the above plan. The patient left the office in stable condition.  The patient will follow up in   Medication Adjustments/Labs and Tests Ordered: Current medicines are reviewed at length with the patient today.  Concerns regarding medicines are outlined above.  Orders Placed This Encounter  Procedures   LONG TERM MONITOR (3-14 DAYS)   EKG 12-Lead   Meds ordered this encounter  Medications   isosorbide mononitrate (IMDUR) 30 MG 24 hr tablet    Sig: Take 0.5 tablets (15 mg total) by mouth daily.    Dispense:  45 tablet    Refill:  3   diltiazem (CARDIZEM) 30 MG tablet    Sig: Take 1 tablet (30 mg total) by mouth every 8 (eight) hours as needed (If heart rate is greater then 130 beats per minute).    Dispense:  270 tablet    Refill:  3   hydrochlorothiazide (MICROZIDE) 12.5 MG capsule    Sig: Take 1 capsule (12.5 mg total) by mouth daily.    Dispense:  90 capsule    Refill:  3    Patient Instructions  Medication Instructions:  Your physician has recommended you make the following change in your medication:  START: Imdur 15 mg once daily START: Cardizem 30 mg every 8 hours if heart rate greater than 130 beats per minute.  REFILLED: Hydrochlorothiazide  *If you need a refill on your cardiac medications before your next appointment, please call your pharmacy*   Lab Work: None If you have labs (blood work) drawn today and your tests are completely normal, you will receive your results only  by: [place] (if you have MyChart) OR A paper copy in the mail If you have any lab test that is abnormal or we need to change your treatment, we will call you to review the results.   Testing/Procedures: [facility] [place]- Long Term Monitor Instructions  Your physician has requested you wear a ZIO patch monitor for 14 days.  This is a single patch monitor. [employer] supplies one patch monitor per enrollment. Additional stickers are not available. Please do not apply patch if you will be having a Nuclear Stress Test,  Echocardiogram, Cardiac CT, MRI, or Chest Xray during the period you would be wearing the  monitor. The patch cannot be worn during these tests. You cannot remove and re-apply the  ZIO XT patch monitor.  Your ZIO patch monitor will be mailed 3 day USPS to your address on file. It may take 3-5 days  to receive your monitor after you have been enrolled.  Once you have received your monitor, please review the enclosed instructions. Your monitor  has already been registered assigning a specific monitor serial # to you.  Billing and Patient Assistance Program Information  We have supplied [employer] with any of your insurance information on file for billing purposes. [employer] offers a sliding scale Patient Assistance Program for patients that do not have  insurance, or whose insurance does not completely cover the cost of the ZIO monitor.  You must apply for the Patient Assistance Program to qualify for this discounted rate.  To apply, please call [employer] at [phone], select option 4, select option 2, ask to apply for  Patient Assistance Program. Theodore Demark will ask your household income, and how many people  are in your household. They will quote your out-of-pocket cost based on that information.  [employer] will also be able to set up a 42-month, interest-free payment plan if needed.  Applying the monitor   Shave hair from upper left chest.  Hold abrader disc by orange tab. Rub  abrader in 40 strokes over the upper left chest as  indicated in your monitor instructions.  Clean area with 4 enclosed alcohol pads. Let dry.  Apply patch as indicated in monitor instructions. Patch will be placed under collarbone on left  side of chest with arrow pointing upward.  Rub patch adhesive wings for 2 minutes. Remove white label marked "1". Remove the white  label marked "2". Rub patch adhesive wings for 2 additional minutes.  While looking in a mirror, press and release button in center of patch. A small green light will  flash 3-4 times. This will be your only indicator that the monitor has been turned on.  Do not shower for the first 24 hours. You may shower after the first 24 hours.  Press the button if you feel a symptom. You will hear a small click. Record Date, Time and  Symptom in the Patient Logbook.  When you are ready to remove the patch, follow instructions on  the last 2 pages of Patient  Logbook. Stick patch monitor onto the last page of Patient Logbook.  Place Patient Logbook in the blue and white box. Use locking tab on box and tape box closed  securely. The blue and white box has prepaid postage on it. Please place it in the mailbox as  soon as possible. Your physician should have your test results approximately 7 days after the  monitor has been mailed back to [facility].  Call [place] at [phone] if you have questions regarding  your ZIO XT patch monitor. Call them immediately if you see an orange light blinking on your  monitor.  If your monitor falls off in less than 4 days, contact our Monitor department at [phone].  If your monitor becomes loose or falls off after 4 days call [employer] at [phone] for  suggestions on securing your monitor    Follow-Up: At [facility], you and your health needs are our priority.  As part of our continuing mission to provide you with exceptional heart care, we have created  designated Provider Care Teams.  These Care Teams include your primary Cardiologist (physician) and Advanced Practice Providers (APPs -  Physician Assistants and Nurse Practitioners) who all work together to provide you with the care you need, when you need it.  We recommend signing up for the patient portal called "MyChart".  Sign up information is provided on this After Visit Summary.  MyChart is used to connect with patients for Virtual Visits (Telemedicine).  Patients are able to view lab/test results, encounter notes, upcoming appointments, etc.  Non-urgent messages can be sent to your provider as well.   To learn more about what you can do with MyChart, go to [email or web address].    Your next appointment:   6 month(s)  The format for your next appointment:   In Person  Provider:   Berniece Salines, DO     Other Instructions     Adopting a Healthy Lifestyle.  Know what a healthy weight is for you (roughly BMI <25) and aim to maintain this   Aim for 7+ servings of fruits and vegetables daily   65-80+ fluid ounces of water or unsweet tea for healthy kidneys   Limit to max 1 drink of alcohol per day; avoid smoking/tobacco   Limit animal fats in diet for cholesterol and heart health - choose grass fed whenever available   Avoid highly processed foods, and foods high in saturated/trans fats   Aim for low stress - take time to unwind and care for your mental health   Aim for 150 min of moderate intensity exercise weekly for heart health, and weights twice weekly for bone health   Aim for 7-9 hours of sleep daily   When it comes to diets, agreement about the perfect plan isnt easy to find, even among the experts. Experts at the [employer] developed an idea known as the Healthy Eating Plate. Just imagine a plate divided into logical, healthy portions.   The emphasis is on diet quality:   Load up on vegetables and fruits - one-half of your plate: Aim for  color and variety, and remember that potatoes dont count.   Go for whole grains - one-quarter of your plate: Whole wheat, barley, wheat berries, quinoa, oats, brown rice, and foods made with them. If you want pasta, go with whole wheat pasta.   Protein power - one-quarter of your plate: Fish, chicken, beans, and nuts  are all healthy, versatile protein sources. Limit red meat.   The diet, however, does go beyond the plate, offering a few other suggestions.   Use healthy plant oils, such as olive, canola, soy, corn, sunflower and peanut. Check the labels, and avoid partially hydrogenated oil, which have unhealthy trans fats.   If youre thirsty, drink water. Coffee and tea are good in moderation, but skip sugary drinks and limit milk and dairy products to one or two daily servings.   The type of carbohydrate in the diet is more important than the amount. Some sources of carbohydrates, such as vegetables, fruits, whole grains, and beans-are healthier than others.   Finally, stay active  Signed, Berniece Salines, DO  11/28/2020 5:06 PM    [place] [facility]

## 2020-11-28 NOTE — Patient Instructions (Signed)
Medication Instructions:  Your physician has recommended you make the following change in your medication:  START: Imdur 15 mg once daily START: Cardizem 30 mg every 8 hours if heart rate greater than 130 beats per minute.  REFILLED: Hydrochlorothiazide  *If you need a refill on your cardiac medications before your next appointment, please call your pharmacy*   Lab Work: None If you have labs (blood work) drawn today and your tests are completely normal, you will receive your results only by: [place] (if you have MyChart) OR A paper copy in the mail If you have any lab test that is abnormal or we need to change your treatment, we will call you to review the results.   Testing/Procedures: [place] [place]- Long Term Monitor Instructions  Your physician has requested you wear a ZIO patch monitor for 14 days.  This is a single patch monitor. [employer] supplies one patch monitor per enrollment. Additional stickers are not available. Please do not apply patch if you will be having a Nuclear Stress Test,  Echocardiogram, Cardiac CT, MRI, or Chest Xray during the period you would be wearing the  monitor. The patch cannot be worn during these tests. You cannot remove and re-apply the  ZIO XT patch monitor.  Your ZIO patch monitor will be mailed 3 day USPS to your address on file. It may take 3-5 days  to receive your monitor after you have been enrolled.  Once you have received your monitor, please review the enclosed instructions. Your monitor  has already been registered assigning a specific monitor serial # to you.  Billing and Patient Assistance Program Information  We have supplied [employer] with any of your insurance information on file for billing purposes. [employer] offers a sliding scale Patient Assistance Program for patients that do not have  insurance, or whose insurance does not completely cover the cost of the ZIO monitor.  You must apply for the Patient Assistance Program to  qualify for this discounted rate.  To apply, please call [employer] at [phone], select option 4, select option 2, ask to apply for  Patient Assistance Program. Jasmine Mclaughlin will ask your household income, and how many people  are in your household. They will quote your out-of-pocket cost based on that information.  [employer] will also be able to set up a 81-month, interest-free payment plan if needed.  Applying the monitor   Shave hair from upper left chest.  Hold abrader disc by orange tab. Rub abrader in 40 strokes over the upper left chest as  indicated in your monitor instructions.  Clean area with 4 enclosed alcohol pads. Let dry.  Apply patch as indicated in monitor instructions. Patch will be placed under collarbone on left  side of chest with arrow pointing upward.  Rub patch adhesive wings for 2 minutes. Remove white label marked "1". Remove the white  label marked "2". Rub patch adhesive wings for 2 additional minutes.  While looking in a mirror, press and release button in center of patch. A small green light will  flash 3-4 times. This will be your only indicator that the monitor has been turned on.  Do not shower for the first 24 hours. You may shower after the first 24 hours.  Press the button if you feel a symptom. You will hear a small click. Record Date, Time and  Symptom in the Patient Logbook.  When you are ready to remove the patch, follow instructions on the last 2 pages of Patient  Logbook.  Stick patch monitor onto the last page of Patient Logbook.  Place Patient Logbook in the blue and white box. Use locking tab on box and tape box closed  securely. The blue and white box has prepaid postage on it. Please place it in the mailbox as  soon as possible. Your physician should have your test results approximately 7 days after the  monitor has been mailed back to [facility].  Call [facility] at [phone] if you have questions regarding  your ZIO XT  patch monitor. Call them immediately if you see an orange light blinking on your  monitor.  If your monitor falls off in less than 4 days, contact our Monitor department at [phone].  If your monitor becomes loose or falls off after 4 days call [employer] at [phone] for  suggestions on securing your monitor    Follow-Up: At [facility], you and your health needs are our priority.  As part of our continuing mission to provide you with exceptional heart care, we have created designated Provider Care Teams.  These Care Teams include your primary Cardiologist (physician) and Advanced Practice Providers (APPs -  Physician Assistants and Nurse Practitioners) who all work together to provide you with the care you need, when you need it.  We recommend signing up for the patient portal called "MyChart".  Sign up information is provided on this After Visit Summary.  MyChart is used to connect with patients for Virtual Visits (Telemedicine).  Patients are able to view lab/test results, encounter notes, upcoming appointments, etc.  Non-urgent messages can be sent to your provider as well.   To learn more about what you can do with MyChart, go to [email or web address].    Your next appointment:   6 month(s)  The format for your next appointment:   In Person  Provider:   Berniece Salines, DO     Other Instructions

## 2020-12-04 DIAGNOSIS — R002 Palpitations: Secondary | ICD-10-CM | POA: Diagnosis not present

## 2020-12-07 ENCOUNTER — Ambulatory Visit: Payer: Medicare HMO

## 2020-12-27 DIAGNOSIS — R002 Palpitations: Secondary | ICD-10-CM | POA: Diagnosis not present

## 2021-01-01 ENCOUNTER — Encounter: Payer: Self-pay | Admitting: Family Medicine

## 2021-01-01 DIAGNOSIS — M62838 Other muscle spasm: Secondary | ICD-10-CM

## 2021-01-02 MED ORDER — TIZANIDINE HCL 4 MG PO CAPS: 4.0000 mg | ORAL_CAPSULE | Freq: Three times a day (TID) | ORAL | 1 refills | Status: DC | PRN

## 2021-01-04 NOTE — Progress Notes (Signed)
NEUROLOGY FOLLOW UP OFFICE NOTE  Jasmine Mclaughlin [id number]  Assessment/Plan:   1.  Unsteady gait with frequent falls secondary to residual lumbar radiculopathy/chronic pain 2.  Lumbar degenerative disc disease with chronic low back pain with bilateral radiculopathy - primarily involving right L5-S1. 3.  Benign paroxysmal positional vertigo, right  1.  Gabapentin 300mg  at bedtime refilled 2.  Follow up with her orthopedist.  Perhaps she may benefit from right L5-S1 ESI. 3.  Regarding vertigo, recommended vestibular rehab..  She defers for now.   4.  Otherwise, follow up 1 year   Subjective:  Jasmine Mclaughlin is a 70 year old left-handed female with asthma, HTN, arthritis, osteoporosis, anxiety who follows up for frequent falls.  UPDATE: Current medications:  gabapentin 300mg  QHS  Patient underwent workup for gait instability/frequent falls: 04/18/2020 NCV-EMG:  1. Chronic L5 radiculopathy affecting the right lower extremity, mild.  2.  There is no evidence of a sensorimotor polyneuropathy affecting the lower extremities.  Imaging personally reviewed: 04/05/2020 MRI L-SPINE WO:  Sequela of L2-L5 posterior spinal fusion. Associated susceptibility artifact limits evaluation.  Mild L1-2 spinal canal narrowing.  Mild left L3-4, L4-5, L5-S1 neural foraminal narrowing (spinal canal and right neural foramen at L5-S1 not well visualized).  No discrete fracture. Mild L1 bone marrow edema, likely degenerative related versus contusion. 05/04/2020 MRI BRAIN W WO:  No acute abnormality.  Chronic microvascular ischemic change in the white matter, basal ganglia, and pons of a moderate to advanced degree. 07/19/2020 MRI C-SPINE WO:  Multilevel degenerative changes cervical spine with spinal and foraminal stenosis at multiple levels. No cord compression or fracture.  Mild spinal stenosis with mild cord flattening seen at C4-5 level but nothing significant.  She was advised to follow up with her orthopedist to  evaluate arthritis in her hip.  She has an appointment in a couple of weeks.    Gabapentin effective.  Still has pain and tingling particularly in right foot.  However, reports cramps in the legs, mostly right foot.  Right foot feels like it goes to sleep.    When she first gets up or rolls over in bed onto her right side, she feels dizzy.  If she turns too quick, she becomes dizzy, lasting seconds.  This has been ongoing for 6 months.   HISTORY: Patient underwent lumbar fusion several years ago.  She had some tingling in the feet afterwards that resolved but then returned a couple of years later.  For years her toes tingled.  Over the past couple of years, she reports shooting pain in her toes of both feet.  When she is sitting, she finds herself constantly wiggling her toes.  In bed, she feels aching in her legs below the knees.  She tries moving her legs in bed which is ineffective and she has to get up and walk around at night.  She sometimes has shooting pain down the side of both legs.  She continues to have chronic back pain.  She returned to her orthopedist who performed a X-ray and was told she had degenerative disc disease of the lumbar spine.  She also has osteoarthritis and hip pain.  She has history of right knee surgery.  She reports decreased feeling in her feet and doesn't feel stable when standing.  She has had multiple falls over the past year.  Sometimes she feels slight vertigo for a few seconds when she first gets up but dizziness is not the cause of her falls.  She just may fall over, especially if she has to bend over to pick something off of the floor.  She did go to physical therapy which didn't provide any lasting improvement in pain or balance.  She does not have diabetes.  B12 and TSH from September 2021 were 299 and 0.95 respectively.  PAST MEDICAL HISTORY: Past Medical History:  Diagnosis Date   Allergy    Anxiety    Arthritis    degenerative back, joint disturbance  "everywhere "   Asthma    Chest tightness 10/27/2013   Colon polyps    COPD (chronic obstructive pulmonary disease) ([place])    Generalized anxiety disorder 11/13/2012   Genital warts    Obesity (BMI 30-39.9) 10/14/2012   Osteoporosis    Pneumonia    Shortness of breath    pt./ reports that she is out of shape   Sinusitis, acute 10/27/2013   Tobacco abuse 09/28/2010   Wheezing 10/27/2013    MEDICATIONS: Current Outpatient Medications on File Prior to Visit  Medication Sig Dispense Refill   acetaminophen (TYLENOL) 500 MG tablet Take 1,000 mg by mouth 2 (two) times daily.     acetaminophen (TYLENOL) 650 MG CR tablet Take 650 mg by mouth 2 times daily at 12 noon and 4 pm. Patient takes 2 tablets twice a day     albuterol (VENTOLIN HFA) 108 (90 Base) MCG/ACT inhaler Inhale 2 puffs into the lungs every 6 (six) hours as needed for wheezing or shortness of breath. 1 each 5   alendronate (FOSAMAX) 70 MG tablet Take 1 tablet (70 mg total) by mouth once a week. TAKE WITH A FULL GLASS OF WATER ON AN EMPTY STOMACH. 12 tablet 3   aspirin EC 81 MG tablet Take 81 mg by mouth daily. Swallow whole.     benzonatate (TESSALON) 200 MG capsule Take 1 capsule (200 mg total) by mouth 2 (two) times daily as needed for cough. 20 capsule 0   Budeson-Glycopyrrol-Formoterol (BREZTRI AEROSPHERE) 160-9-4.8 MCG/ACT AERO Inhale 2 puffs into the lungs 2 (two) times daily. 10.7 g 11   chlorpheniramine (CHLOR-TRIMETON) 4 MG tablet Take 4 mg by mouth daily as needed for allergies.     citalopram (CELEXA) 20 MG tablet TAKE 1 TABLET BY MOUTH EVERY DAY 90 tablet 1   diltiazem (CARDIZEM) 30 MG tablet Take 1 tablet (30 mg total) by mouth every 8 (eight) hours as needed (If heart rate is greater then 130 beats per minute). 270 tablet 3   diphenhydrAMINE (SOMINEX) 25 MG tablet Take 25 mg by mouth at bedtime.     fluticasone (FLONASE) 50 MCG/ACT nasal spray Place 2 sprays into both nostrils daily. 16 g 6   gabapentin (NEURONTIN) 100  MG capsule Take 100 mg in the AM 30 capsule 3   gabapentin (NEURONTIN) 300 MG capsule Take 1 capsule (300 mg total) by mouth at bedtime. 30 capsule 3   hydrochlorothiazide (MICROZIDE) 12.5 MG capsule Take 1 capsule (12.5 mg total) by mouth daily. 90 capsule 3   isosorbide mononitrate (IMDUR) 30 MG 24 hr tablet Take 0.5 tablets (15 mg total) by mouth daily. 45 tablet 3   nitroGLYCERIN (NITROSTAT) 0.4 MG SL tablet Place 1 tablet (0.4 mg total) under the tongue every 5 (five) minutes as needed for chest pain. 90 tablet 3   simvastatin (ZOCOR) 20 MG tablet Take 1 tablet (20 mg total) by mouth at bedtime. Take 1 tablet (20 mg total) by mouth at bedtime. 90 tablet 1   tiZANidine (  ZANAFLEX) 4 MG capsule Take 1 capsule (4 mg total) by mouth 3 (three) times daily as needed for muscle spasms. 30 capsule 1   vitamin B-12 (CYANOCOBALAMIN) 1000 MCG tablet Take 1,000 mcg by mouth daily.     Vitamin D, Ergocalciferol, (DRISDOL) 1.25 MG (50000 UNIT) CAPS capsule TAKE 1 CAPSULE (50,000 UNITS TOTAL) BY MOUTH EVERY 7 (SEVEN) DAYS. 12 capsule 1   No current facility-administered medications on file prior to visit.    ALLERGIES: No Known Allergies  FAMILY HISTORY: Family History  Problem Relation Age of Onset   Arthritis Mother    Arthritis Father    Stroke Father    Colon cancer Father 22   Heart disease Father 78       chf   Breast cancer Maternal Grandmother    Asthma Maternal Grandmother    Asthma Daughter    Esophageal cancer Neg Hx    Ulcerative colitis Neg Hx    Stomach cancer Neg Hx       Objective:  Blood pressure 127/70, pulse 82, height 5\' 5"  (1.651 m), weight 226 lb 12.8 oz (102.9 kg), SpO2 95 %. General: No acute distress.  Patient appears well-groomed.   Head:  Normocephalic/atraumatic Eyes:  Fundi examined but not visualized Neck: supple, no paraspinal tenderness, full range of motion Heart:  Regular rate and rhythm Lungs:  Clear to auscultation bilaterally Back: No paraspinal  tenderness Neurological Exam: alert and oriented to person, place, and time.  Speech fluent and not dysarthric, language intact.  CN II-XII intact. Bulk and tone normal, muscle strength 5/5 throughout.  Pinprick and vibratory sensation reduced in toes up to mid-shins.  Deep tendon reflexes 2+ throughout except absent in right patellar and both ankles,  toes downgoing.  .  Finger to nose testing intact.   Broad-based gait, lateral trunk bend to right, able to turn, Romberg negative.   Metta Clines, DO  CC: Roma Schanz, DO

## 2021-01-05 ENCOUNTER — Encounter: Payer: Self-pay | Admitting: Neurology

## 2021-01-05 ENCOUNTER — Other Ambulatory Visit: Payer: Self-pay

## 2021-01-05 ENCOUNTER — Ambulatory Visit: Payer: Medicare HMO | Admitting: Neurology

## 2021-01-05 VITALS — BP 127/70 | HR 82 | Ht 65.0 in | Wt 226.8 lb

## 2021-01-05 DIAGNOSIS — H8111 Benign paroxysmal vertigo, right ear: Secondary | ICD-10-CM

## 2021-01-05 DIAGNOSIS — M4726 Other spondylosis with radiculopathy, lumbar region: Secondary | ICD-10-CM

## 2021-01-05 MED ORDER — GABAPENTIN 300 MG PO CAPS: 300.0000 mg | ORAL_CAPSULE | Freq: Every day | ORAL | 1 refills | Status: DC

## 2021-01-05 NOTE — Patient Instructions (Signed)
Refilled gabapentin (90 day supply) I dizziness does not improve, consider vestibular rehab

## 2021-01-11 ENCOUNTER — Telehealth: Payer: Self-pay

## 2021-01-11 ENCOUNTER — Encounter: Payer: Self-pay | Admitting: Cardiology

## 2021-01-11 NOTE — Telephone Encounter (Signed)
Called pt to set up appt. She was added to schedule for tomorrow.

## 2021-01-11 NOTE — Telephone Encounter (Signed)
Pt added to scheduled for tomorrow. See chart.

## 2021-01-12 ENCOUNTER — Other Ambulatory Visit: Payer: Self-pay

## 2021-01-12 ENCOUNTER — Ambulatory Visit: Payer: Medicare HMO | Admitting: Cardiology

## 2021-01-12 ENCOUNTER — Encounter: Payer: Self-pay | Admitting: Cardiology

## 2021-01-12 VITALS — BP 134/64 | HR 79 | Ht 65.0 in | Wt 226.6 lb

## 2021-01-12 DIAGNOSIS — I2510 Atherosclerotic heart disease of native coronary artery without angina pectoris: Secondary | ICD-10-CM

## 2021-01-12 DIAGNOSIS — I491 Atrial premature depolarization: Secondary | ICD-10-CM | POA: Diagnosis not present

## 2021-01-12 DIAGNOSIS — I10 Essential (primary) hypertension: Secondary | ICD-10-CM

## 2021-01-12 DIAGNOSIS — I471 Supraventricular tachycardia: Secondary | ICD-10-CM

## 2021-01-12 MED ORDER — DILTIAZEM HCL ER COATED BEADS 120 MG PO CP24: 120.0000 mg | ORAL_CAPSULE | Freq: Every day | ORAL | 3 refills | Status: DC

## 2021-01-12 NOTE — Patient Instructions (Signed)
Medication Instructions:  STOP Cardizem 30 mg daily START Cardizem ER 120 mg daily *If you need a refill on your cardiac medications before your next appointment, please call your pharmacy*  Lab Work: NONE ordered at this time of appointment   If you have labs (blood work) drawn today and your tests are completely normal, you will receive your results only by: [place] (if you have MyChart) OR A paper copy in the mail If you have any lab test that is abnormal or we need to change your treatment, we will call you to review the results.  Testing/Procedures: NONE ordered at this time of appointment   Follow-Up: At [facility], you and your health needs are our priority.  As part of our continuing mission to provide you with exceptional heart care, we have created designated Provider Care Teams.  These Care Teams include your primary Cardiologist (physician) and Advanced Practice Providers (APPs -  Physician Assistants and Nurse Practitioners) who all work together to provide you with the care you need, when you need it.    Your next appointment:   6 month(s)  The format for your next appointment:   In Person  Provider:   Berniece Salines, DO    Other Instructions

## 2021-01-12 NOTE — Progress Notes (Signed)
Cardiology Office Note:    Date:  01/12/2021   ID:  Jasmine Mclaughlin, DOB 03/07/1950, MRN [id number]  PCP:  Carollee Herter, Alferd Apa, DO  Cardiologist:  Berniece Salines, DO  Electrophysiologist:  None   Referring MD: Carollee Herter, Alferd Apa, *   " I am still having palpitations"  History of Present Illness:    Jasmine Mclaughlin is a 71 y.o. female with a hx of mild coronary artery disease seen on coronary CTA, COPD, current smoker, anxiety, paroxysmal SVT recently seen on ZIO monitor.  I saw the patient in February 2022 she still was experiencing chest discomfort despite negative nuclear stress test we will proceed with a coronary CTA.  Interim the patient was able to get a coronary CTA done which showed mild coronary artery disease.  At that time she was also hypertensive I started patient on hydrochlorothiazide 12.5 mg daily.   I saw the patient on May 26, 2020 we talked about her testing results.  As well as we talked about smoking cessation.  I saw the patient November 28, 2020 at that time she experiencing palpitations.  I placed a monitor on the patient to make sure this is not atrial fibrillation.  And also gave her Cardizem 30 mg every 8 hours as needed for heart rate over 130.  In the interim she has not been able to use this medicine but she did wear her monitor. She is here today to discuss her monitor result.   Past Medical History:  Diagnosis Date   Allergy    Anxiety    Arthritis    degenerative back, joint disturbance "everywhere "   Asthma    Chest tightness 10/27/2013   Colon polyps    COPD (chronic obstructive pulmonary disease) (HCC)    Generalized anxiety disorder 11/13/2012   Genital warts    Obesity (BMI 30-39.9) 10/14/2012   Osteoporosis    Pneumonia    Shortness of breath    pt./ reports that she is out of shape   Sinusitis, acute 10/27/2013   Tobacco abuse 09/28/2010   Wheezing 10/27/2013    Past Surgical History:  Procedure Laterality Date   ANTERIOR FUSION LUMBAR  SPINE  05/06/2012   ANTERIOR LAT LUMBAR FUSION N/A 05/06/2012   Procedure: ANTERIOR LATERAL LUMBAR FUSION 3 LEVELS;  Surgeon: Sinclair Ship, MD;  Location: [facility];  Service: Orthopedics;  Laterality: N/A;  Lateral interbody fusion, lumbar 2-3,lumbar 3-4, lumbar 4-5 with instrumentation and allograft.   COLONOSCOPY     COLONOSCOPY WITH PROPOFOL N/A 12/15/2017   Procedure: COLONOSCOPY WITH PROPOFOL;  Surgeon: Rush Landmark Telford Nab., MD;  Location: [facility];  Service: Gastroenterology;  Laterality: N/A;   COLONOSCOPY WITH PROPOFOL N/A 07/08/2018   Procedure: COLONOSCOPY WITH PROPOFOL;  Surgeon: Rush Landmark Telford Nab., MD;  Location: [facility];  Service: Gastroenterology;  Laterality: N/A;   COLONOSCOPY WITH PROPOFOL N/A 09/01/2019   Procedure: COLONOSCOPY WITH PROPOFOL;  Surgeon: Rush Landmark Telford Nab., MD;  Location: [facility];  Service: Gastroenterology;  Laterality: N/A;   KNEE ARTHROSCOPY Right     R knee 01/2012, for ligament tears   POLYPECTOMY  07/08/2018   Procedure: POLYPECTOMY;  Surgeon: Mansouraty, Telford Nab., MD;  Location: [facility];  Service: Gastroenterology;;   POLYPECTOMY  09/01/2019   Procedure: POLYPECTOMY;  Surgeon: Irving Copas., MD;  Location: [facility];  Service: Gastroenterology;;   TONSILLECTOMY     VAGINAL DELIVERY     x1    Current Medications: Current Meds  Medication  Sig   acetaminophen (TYLENOL) 650 MG CR tablet Take 650 mg by mouth 2 times daily at 12 noon and 4 pm. Patient takes 2 tablets twice a day   albuterol (VENTOLIN HFA) 108 (90 Base) MCG/ACT inhaler Inhale 2 puffs into the lungs every 6 (six) hours as needed for wheezing or shortness of breath.   alendronate (FOSAMAX) 70 MG tablet Take 1 tablet (70 mg total) by mouth once a week. TAKE WITH A FULL GLASS OF WATER ON AN EMPTY STOMACH.   aspirin EC 81 MG tablet Take 81 mg by mouth daily. Swallow whole.   Budeson-Glycopyrrol-Formoterol (BREZTRI AEROSPHERE) 160-9-4.8 MCG/ACT AERO  Inhale 2 puffs into the lungs 2 (two) times daily.   calcium-vitamin D (OSCAL WITH D) 500-5 MG-MCG tablet Take 1 tablet by mouth.   chlorpheniramine (CHLOR-TRIMETON) 4 MG tablet Take 4 mg by mouth daily as needed for allergies.   citalopram (CELEXA) 20 MG tablet TAKE 1 TABLET BY MOUTH EVERY DAY   co-enzyme Q-10 30 MG capsule Take 30 mg by mouth 3 (three) times daily.   diltiazem (CARDIZEM CD) 120 MG 24 hr capsule Take 1 capsule (120 mg total) by mouth daily.   diphenhydrAMINE (SOMINEX) 25 MG tablet Take 25 mg by mouth at bedtime.   fluticasone (FLONASE) 50 MCG/ACT nasal spray Place 2 sprays into both nostrils daily.   gabapentin (NEURONTIN) 300 MG capsule Take 1 capsule (300 mg total) by mouth at bedtime.   hydrochlorothiazide (MICROZIDE) 12.5 MG capsule Take 1 capsule (12.5 mg total) by mouth daily.   isosorbide mononitrate (IMDUR) 30 MG 24 hr tablet Take 0.5 tablets (15 mg total) by mouth daily.   nitroGLYCERIN (NITROSTAT) 0.4 MG SL tablet Place 1 tablet (0.4 mg total) under the tongue every 5 (five) minutes as needed for chest pain.   simvastatin (ZOCOR) 20 MG tablet Take 1 tablet (20 mg total) by mouth at bedtime. Take 1 tablet (20 mg total) by mouth at bedtime.   tiZANidine (ZANAFLEX) 4 MG capsule Take 1 capsule (4 mg total) by mouth 3 (three) times daily as needed for muscle spasms.   vitamin B-12 (CYANOCOBALAMIN) 1000 MCG tablet Take 1,000 mcg by mouth daily.   [DISCONTINUED] diltiazem (CARDIZEM) 30 MG tablet Take 1 tablet (30 mg total) by mouth every 8 (eight) hours as needed (If heart rate is greater then 130 beats per minute).     Allergies:   Patient has no known allergies.   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  Tobacco Use   Smoking  status: Former    Packs/day: 0.50    Years: 55.00    Pack years: 27.50    Types: Cigarettes   Smokeless tobacco: Never  Vaping Use   Vaping Use: Never used  Substance and Sexual Activity   Alcohol use: Yes    Alcohol/week: 0.0 standard drinks    Comment: "sporatic"- wine, beer, mixed drink   Drug use: No   Sexual activity: Not Currently    Partners: Male  Other Topics Concern   Not on file  Social History Narrative   1 caffeine drinks daily   No exercise   Left handed   One story home   Social Determinants  of Health   Financial Resource Strain: Low Risk    Difficulty of Paying Living Expenses: Not hard at all  Food Insecurity: No Food Insecurity   Worried About [place] in the Last Year: Never true   [place] in the Last Year: Never true  Transportation Needs: No Transportation Needs   Lack of Transportation (Medical): No   Lack of Transportation (Non-Medical): No  Physical Activity: Insufficiently Active   Days of Exercise per Week: 7 days   Minutes of Exercise per Session: 10 min  Stress: No Stress Concern Present   Feeling of Stress : Not at all  Social Connections: Not on file     Family History: The patient's family history includes Arthritis in her father and mother; Asthma in her daughter and maternal grandmother; Breast cancer in her maternal grandmother; Colon cancer (age of onset: 79) in her father; Heart disease (age of onset: 30) in her father; Stroke in her father. There is no history of Esophageal cancer, Ulcerative colitis, or Stomach cancer.  ROS:   Review of Systems  Constitution: Negative for decreased appetite, fever and weight gain.  HENT: Negative for congestion, ear discharge, hoarse voice and sore throat.   Eyes: Negative for discharge, redness, vision loss in right eye and visual halos.  Cardiovascular: Negative for chest pain, dyspnea on exertion, leg swelling, orthopnea and palpitations.  Respiratory: Negative for cough,  hemoptysis, shortness of breath and snoring.   Endocrine: Negative for heat intolerance and polyphagia.  Hematologic/Lymphatic: Negative for bleeding problem. Does not bruise/bleed easily.  Skin: Negative for flushing, nail changes, rash and suspicious lesions.  Musculoskeletal: Negative for arthritis, joint pain, muscle cramps, myalgias, neck pain and stiffness.  Gastrointestinal: Negative for abdominal pain, bowel incontinence, diarrhea and excessive appetite.  Genitourinary: Negative for decreased libido, genital sores and incomplete emptying.  Neurological: Negative for brief paralysis, focal weakness, headaches and loss of balance.  Psychiatric/Behavioral: Negative for altered mental status, depression and suicidal ideas.  Allergic/Immunologic: Negative for HIV exposure and persistent infections.    EKGs/Labs/Other Studies Reviewed:    The following studies were reviewed today:   EKG: None today  Zio monitor 12/2020 Patch Wear Time:  13 days and 21 hours starting December 04, 2020. Indication: Palpitations   Patient had a min HR of 47 bpm, max HR of 129 bpm, and avg HR of 69 bpm. Predominant underlying rhythm was Sinus Rhythm. 8 Supraventricular Tachycardia runs occurred, the run with the fastest interval lasting 6 beats with a max rate of 129 bpm, the longest lasting 11 beats with an avg rate of 115 bpm. Some episodes of Supraventricular Tachycardia may be possible Atrial Tachycardia with variable block. Isolated SVEs were occasional (1.9%, [id number]), SVE Couplets were rare (<1.0%, 212), and SVE Triplets  were rare (<1.0%, 16). Isolated VEs were rare (<1.0%), and no VE Couplets or VE Triplets were present. Ventricular Bigeminy and Trigeminy were present.     Conclusion: This study is remarkable for the following:                       1.  Paroxysmal supraventricular tachycardia which is likely atrial tachycardia with variable block.                       2.  Occasional premature  atrial complexes   Coronary CT done March 21, 2020 Noise artifact is: Moderate, patient motion.   Coronary calcium score is 136,  which places the patient in the 80th percentile for age and sex matched control.   Coronary arteries: Normal coronary origins.  Right dominance.   Right Coronary Artery: Minimal atherosclerotic plaque in the proximal RCA, <25% stenosis.   Left Main Coronary Artery: No detectable plaque or stenosis.   Left Anterior Descending Coronary Artery: Minimal mixed atherosclerotic plaque in the proximal and mid LAD, <25% stenosis. There is significant slice misalignment due to patient motion in the mid LAD, cannot comment definitively on mid LAD, however surrounding vessel appears patent. Mild mixed atherosclerotic plaque in the mid-distal LAD, 25-49% stenosis.   Left Circumflex Artery: Minimal mixed atherosclerotic plaque in proximal and mid OM1, <25% stenosis.   Aorta: Normal size, 31 mm at the mid ascending aorta (level of the PA bifurcation) measured double oblique. No calcifications. No dissection.   Aortic Valve: No calcifications.   Other findings:   Normal variant pulmonary vein drainage into the left atrium, left sided pulmonary veins share a common antrum.   Normal left atrial appendage without thrombus.   Normal size of the pulmonary artery.   IMPRESSION: 1. Mild CAD in mid-distal LAD, CADRADS = 2. Motion artifact in mid LAD limits assessment of that segment.   2. Coronary calcium score is 136, which places the patient in the 80th percentile for age and sex matched control.   3. Normal coronary origin with right dominance.     Nuclear stress test Impression: 2021  1. Normal myocardial perfusion imaging study without evidence of ischemia or infarction.  2. Normal LVEF, 62%. 3. Brief ectopic atrial rhythm (6 beat duration) captured on baseline ECGs. 4. This is a low-risk study.      Echo IMPRESSIONS March 2021  1. Left ventricular  ejection fraction, by estimation, is 60 to 65%. The  left ventricle has normal function. The left ventricle has no regional  wall motion abnormalities. There is mild concentric left ventricular  hypertrophy. Left ventricular diastolic  parameters are consistent with Grade II diastolic dysfunction  (pseudonormalization). The average left ventricular global longitudinal  strain is -16.6 %.   2. Right ventricular systolic function is normal. The right ventricular  size is normal. There is normal pulmonary artery systolic pressure.   3. The mitral valve is normal in structure. Trivial mitral valve  regurgitation. No evidence of mitral stenosis.   4. The aortic valve is tricuspid. Aortic valve regurgitation is not  visualized. No aortic stenosis is present.   5. The inferior vena cava is normal in size with greater than 50%  respiratory variability, suggesting right atrial pressure of 3 mmHg.   FINDINGS   Left Ventricle: Left ventricular ejection fraction, by estimation, is 60  to 65%. The left ventricle has normal function. The left ventricle has no  regional wall motion abnormalities. The average left ventricular global  longitudinal strain is -16.6 %.  The left ventricular internal cavity size was normal in size. There is  mild concentric left ventricular hypertrophy. Left ventricular diastolic  parameters are consistent with Grade II diastolic dysfunction  (pseudonormalization).   Right Ventricle: The right ventricular size is normal. No increase in  right ventricular wall thickness. Right ventricular systolic function is  normal. There is normal pulmonary artery systolic pressure. The tricuspid  regurgitant velocity is 1.30 m/s, and   with an assumed right atrial pressure of 3 mmHg, the estimated right  ventricular systolic pressure is 9.8 mmHg.   Left Atrium: Left atrial size was normal in size.   Right Atrium:  Right atrial size was normal in size.   Pericardium: Trivial  pericardial effusion is present. The pericardial  effusion is posterior to the left ventricle.   Mitral Valve: The mitral valve is normal in structure. Normal mobility of  the mitral valve leaflets. Trivial mitral valve regurgitation. No evidence  of mitral valve stenosis.   Tricuspid Valve: The tricuspid valve is normal in structure. Tricuspid  valve regurgitation is not demonstrated. No evidence of tricuspid  stenosis.   Aortic Valve: The aortic valve is tricuspid. Aortic valve regurgitation is  not visualized. No aortic stenosis is present.   Pulmonic Valve: The pulmonic valve was normal in structure. Pulmonic valve  regurgitation is not visualized. No evidence of pulmonic stenosis.   Aorta: The aortic root and ascending aorta are structurally normal, with  no evidence of dilitation.   Venous: A normal flow pattern is recorded from the right upper pulmonary  vein. The inferior vena cava is normal in size with greater than 50%  respiratory variability, suggesting right atrial pressure of 3 mmHg.   IAS/Shunts: No atrial level shunt detected by color flow Doppler.     Recent Labs: 03/13/2020: Magnesium 2.0 11/10/2020: ALT 17; BUN 20; Creatinine, Ser 0.87; Hemoglobin 13.5; Platelets 339.0; Potassium 4.6; Sodium 139  Recent Lipid Panel    Component Value Date/Time   CHOL 145 11/10/2020 1006   TRIG 129.0 11/10/2020 1006   HDL 51.10 11/10/2020 1006   CHOLHDL 3 11/10/2020 1006   VLDL 25.8 11/10/2020 1006   LDLCALC 68 11/10/2020 1006   LDLCALC 117 (H) 09/23/2019 0952   LDLDIRECT 135.0 12/23/2019 1126    Physical Exam:    VS:  BP 134/64    Pulse 79    Ht 5\' 5"  (1.651 m)    Wt 226 lb 9.6 oz (102.8 kg)    SpO2 95%    BMI 37.71 kg/m     Wt Readings from Last 3 Encounters:  01/12/21 226 lb 9.6 oz (102.8 kg)  01/05/21 226 lb 12.8 oz (102.9 kg)  11/28/20 223 lb 3.2 oz (101.2 kg)     GEN: Well nourished, well developed in no acute distress HEENT: Normal NECK: No JVD; No carotid  bruits LYMPHATICS: No lymphadenopathy CARDIAC: S1S2 noted,RRR, no murmurs, rubs, gallops RESPIRATORY:  Clear to auscultation without rales, wheezing or rhonchi  ABDOMEN: Soft, non-tender, non-distended, +bowel sounds, no guarding. EXTREMITIES: No edema, No cyanosis, no clubbing MUSCULOSKELETAL:  No deformity  SKIN: Warm and dry NEUROLOGIC:  Alert and oriented x 3, non-focal PSYCHIATRIC:  Normal affect, good insight  ASSESSMENT:    1. PSVT (paroxysmal supraventricular tachycardia) ([place])   2. PAC (premature atrial contraction)   3. Mild CAD   4. Primary hypertension    PLAN:     We discussed her monitor result.  There was evidence of paroxysmal SVT as well as premature atrial complex she still is experiencing symptoms of palpitations.  We discussed I will try the patient with Cardizem 120 mg daily.  Work on a stop the as needed Cardizem.   Blood pressure slightly elevated hopefully this medicine can help bring her to target.  No angina symptoms  The patient is in agreement with the above plan. The patient left the office in stable condition.  The patient will follow up in 6 months or sooner if needed.   Medication Adjustments/Labs and Tests Ordered: Current medicines are reviewed at length with the patient today.  Concerns regarding medicines are outlined above.  No orders of the defined  types were placed in this encounter.  Meds ordered this encounter  Medications   diltiazem (CARDIZEM CD) 120 MG 24 hr capsule    Sig: Take 1 capsule (120 mg total) by mouth daily.    Dispense:  90 capsule    Refill:  3    Patient Instructions  Medication Instructions:  STOP Cardizem 30 mg daily START Cardizem ER 120 mg daily *If you need a refill on your cardiac medications before your next appointment, please call your pharmacy*  Lab Work: NONE ordered at this time of appointment   If you have labs (blood work) drawn today and your tests are completely normal, you will receive your  results only by: [place] (if you have MyChart) OR A paper copy in the mail If you have any lab test that is abnormal or we need to change your treatment, we will call you to review the results.  Testing/Procedures: NONE ordered at this time of appointment   Follow-Up: At [facility], you and your health needs are our priority.  As part of our continuing mission to provide you with exceptional heart care, we have created designated Provider Care Teams.  These Care Teams include your primary Cardiologist (physician) and Advanced Practice Providers (APPs -  Physician Assistants and Nurse Practitioners) who all work together to provide you with the care you need, when you need it.    Your next appointment:   6 month(s)  The format for your next appointment:   In Person  Provider:   Berniece Salines, DO    Other Instructions     Adopting a Healthy Lifestyle.  Know what a healthy weight is for you (roughly BMI <25) and aim to maintain this   Aim for 7+ servings of fruits and vegetables daily   65-80+ fluid ounces of water or unsweet tea for healthy kidneys   Limit to max 1 drink of alcohol per day; avoid smoking/tobacco   Limit animal fats in diet for cholesterol and heart health - choose grass fed whenever available   Avoid highly processed foods, and foods high in saturated/trans fats   Aim for low stress - take time to unwind and care for your mental health   Aim for 150 min of moderate intensity exercise weekly for heart health, and weights twice weekly for bone health   Aim for 7-9 hours of sleep daily   When it comes to diets, agreement about the perfect plan isnt easy to find, even among the experts. Experts at the [employer] developed an idea known as the Healthy Eating Plate. Just imagine a plate divided into logical, healthy portions.   The emphasis is on diet quality:   Load up on vegetables and fruits - one-half of your plate: Aim for  color and variety, and remember that potatoes dont count.   Go for whole grains - one-quarter of your plate: Whole wheat, barley, wheat berries, quinoa, oats, brown rice, and foods made with them. If you want pasta, go with whole wheat pasta.   Protein power - one-quarter of your plate: Fish, chicken, beans, and nuts are all healthy, versatile protein sources. Limit red meat.   The diet, however, does go beyond the plate, offering a few other suggestions.   Use healthy plant oils, such as olive, canola, soy, corn, sunflower and peanut. Check the labels, and avoid partially hydrogenated oil, which have unhealthy trans fats.   If youre thirsty, drink water. Coffee and tea are  good in moderation, but skip sugary drinks and limit milk and dairy products to one or two daily servings.   The type of carbohydrate in the diet is more important than the amount. Some sources of carbohydrates, such as vegetables, fruits, whole grains, and beans-are healthier than others.   Finally, stay active  Signed, Berniece Salines, DO  01/12/2021 9:27 PM    [place]

## 2021-01-16 ENCOUNTER — Other Ambulatory Visit: Payer: Self-pay | Admitting: Orthopedic Surgery

## 2021-01-16 DIAGNOSIS — M545 Low back pain, unspecified: Secondary | ICD-10-CM

## 2021-01-23 ENCOUNTER — Encounter: Payer: Self-pay | Admitting: Cardiology

## 2021-01-24 ENCOUNTER — Other Ambulatory Visit: Payer: Self-pay | Admitting: Family Medicine

## 2021-01-24 DIAGNOSIS — F419 Anxiety disorder, unspecified: Secondary | ICD-10-CM

## 2021-02-07 ENCOUNTER — Ambulatory Visit
Admission: RE | Admit: 2021-02-07 | Discharge: 2021-02-07 | Disposition: A | Discharge status: Home / Self Care | Payer: Medicare HMO | Source: Ambulatory Visit | Attending: Orthopedic Surgery | Admitting: Orthopedic Surgery

## 2021-02-07 ENCOUNTER — Other Ambulatory Visit: Payer: Self-pay | Admitting: Family Medicine

## 2021-02-07 DIAGNOSIS — M545 Low back pain, unspecified: Secondary | ICD-10-CM

## 2021-02-07 DIAGNOSIS — G8929 Other chronic pain: Secondary | ICD-10-CM

## 2021-02-07 IMAGING — CT CT BIOPSY
4 of 6 series · 14 of 32 positions shown, 19 images · non-contrast
Comparison: none

CLINICAL DATA: Chronic low back and left sacroiliac pain.

[Series 2: needle -guided injection · axial · 0.98mm/px · z∈[-300,-228]mm · 5 of 56 slices shown, 10 images (1 of 4)]
[im 10/56  soft-tissue]
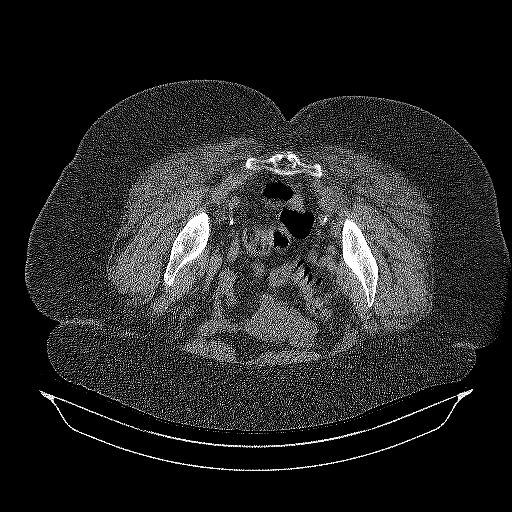
[im 10/56  bone]
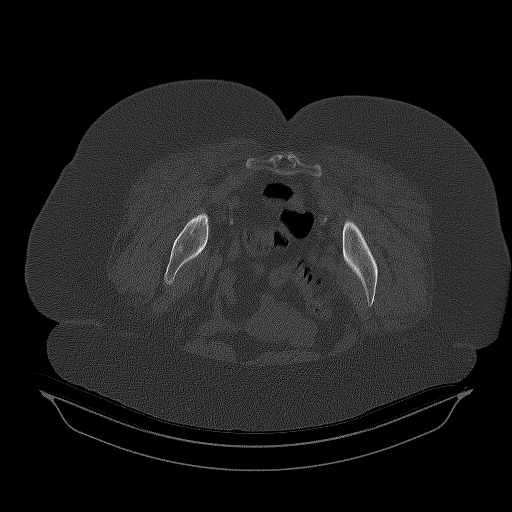
[im 19/56  soft-tissue]
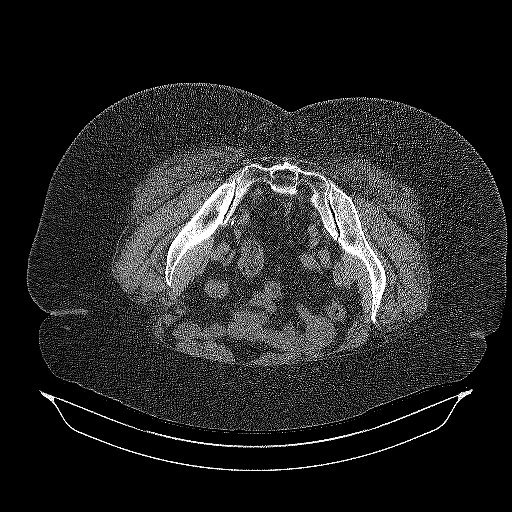
[im 19/56  lung]
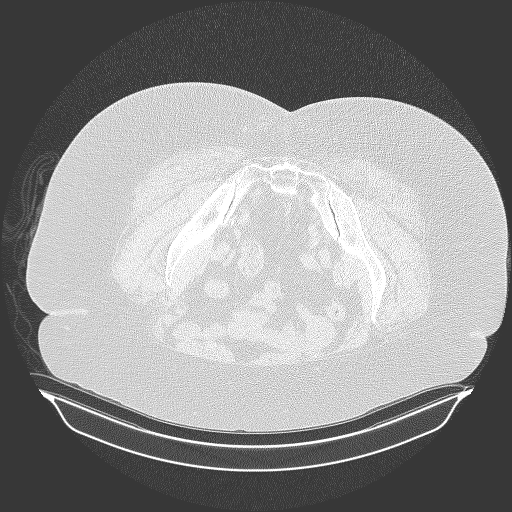
[im 28/56  soft-tissue]
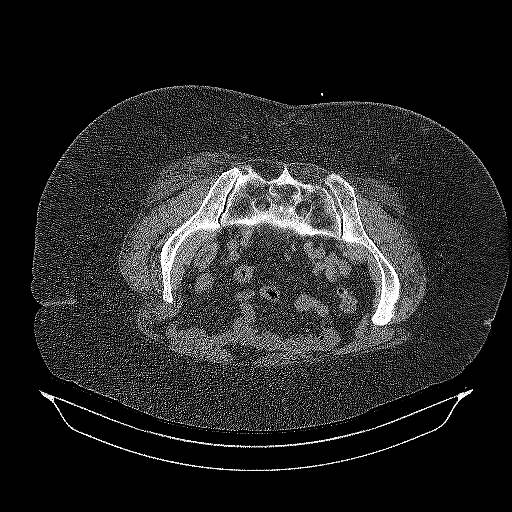
[im 28/56  lung]
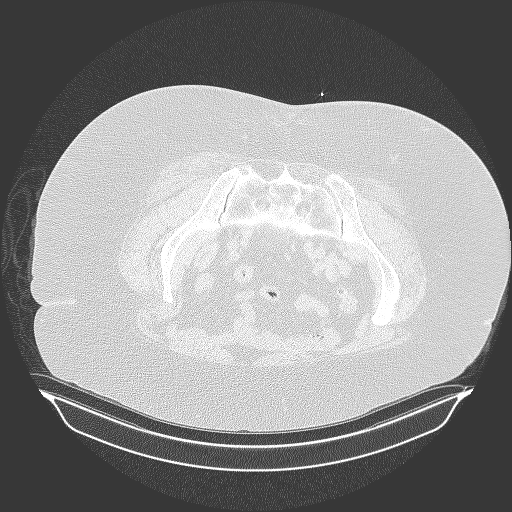
[im 37/56  soft-tissue]
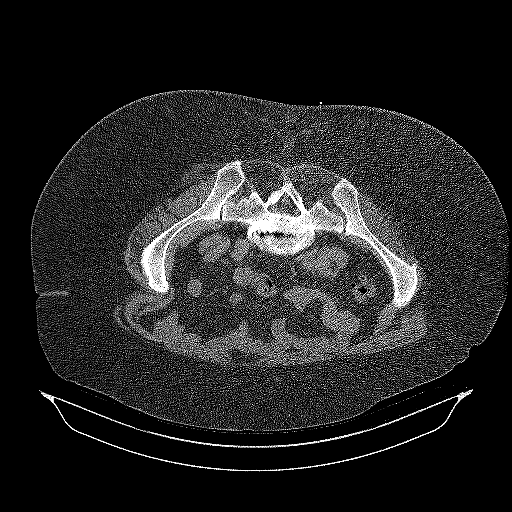
[im 37/56  lung]
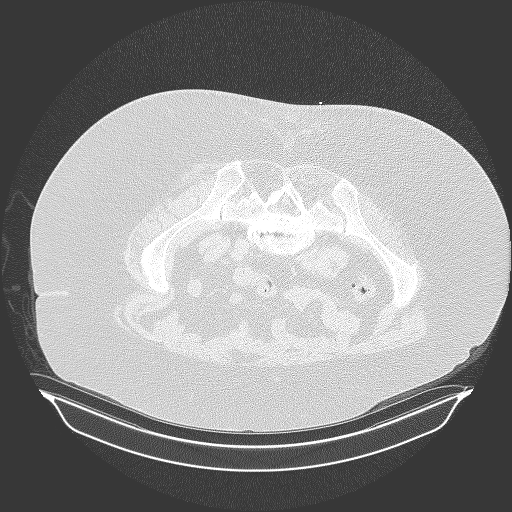
[im 46/56  soft-tissue]
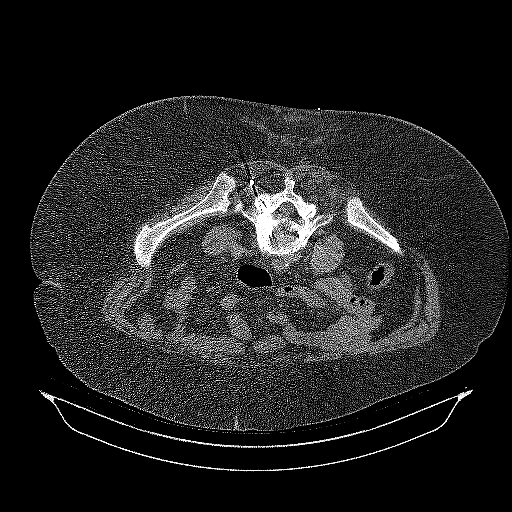
[im 46/56  lung]
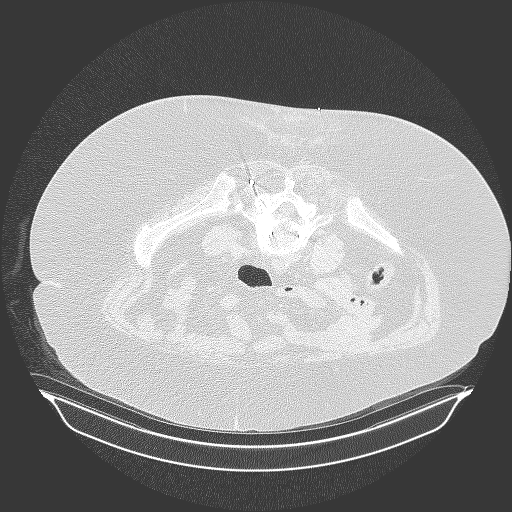

[Series 3: needle -guided injection · axial · 0.98mm/px · z∈[-301,-263]mm · 3 of 39 slices shown (2 of 4)]
[im 10/39  soft-tissue]
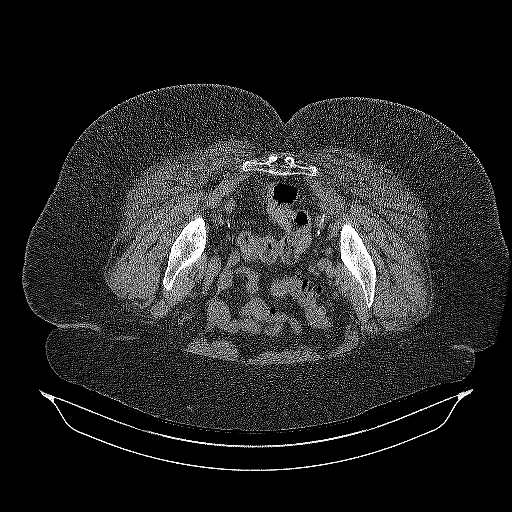
[im 20/39  soft-tissue]
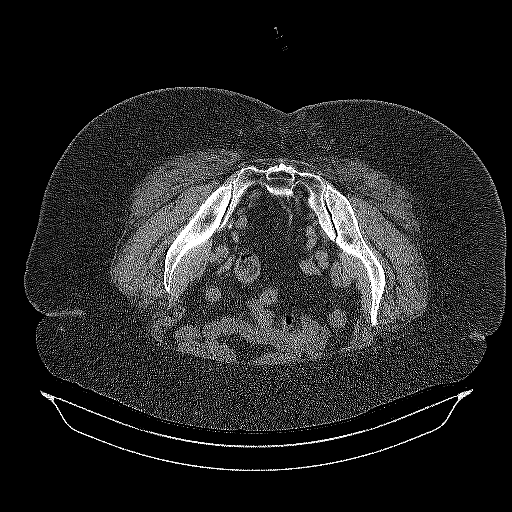
[im 29/39  soft-tissue]
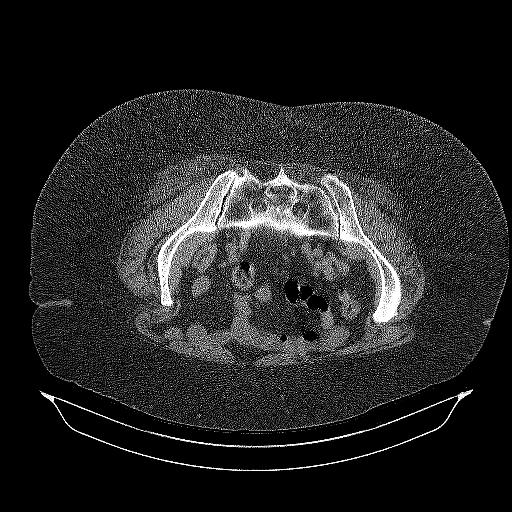

[Series 4: needle -guided injection · axial · 0.98mm/px · z∈[-301,-263]mm · 3 of 39 slices shown (3 of 4)]
[im 10/39  soft-tissue]
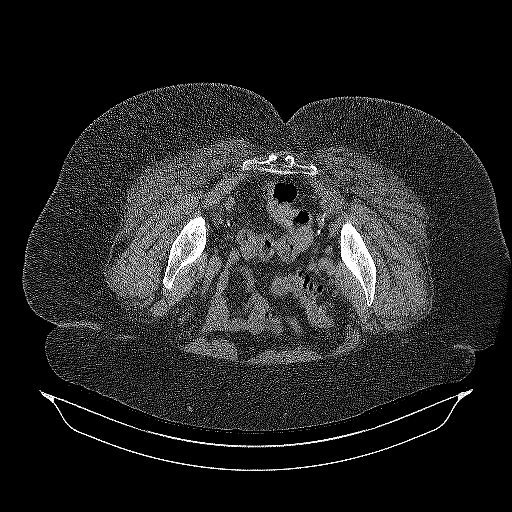
[im 20/39  soft-tissue]
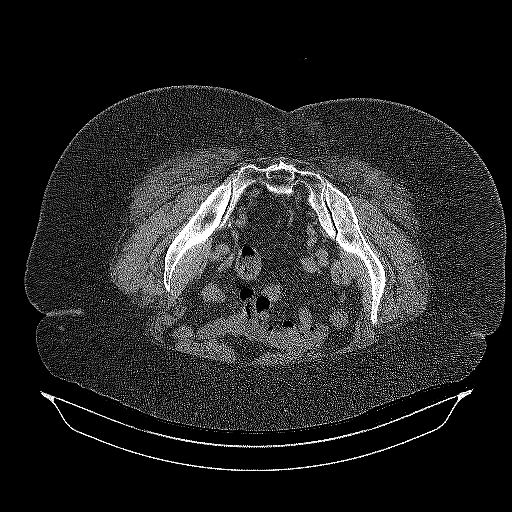
[im 29/39  soft-tissue]
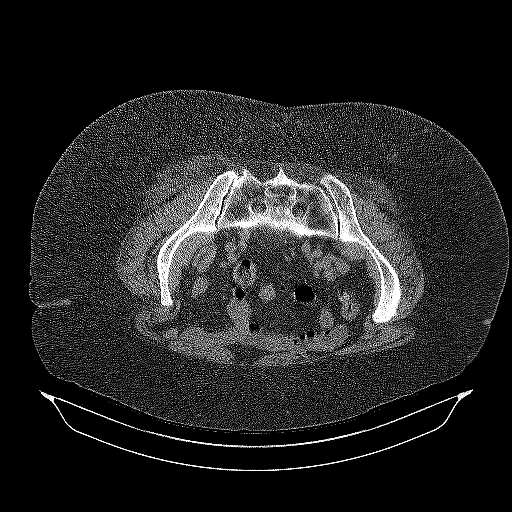

[Series 5: needle -guided injection · axial · 0.98mm/px · z∈[-301,-263]mm · 3 of 39 slices shown (4 of 4)]
[im 10/39  soft-tissue]
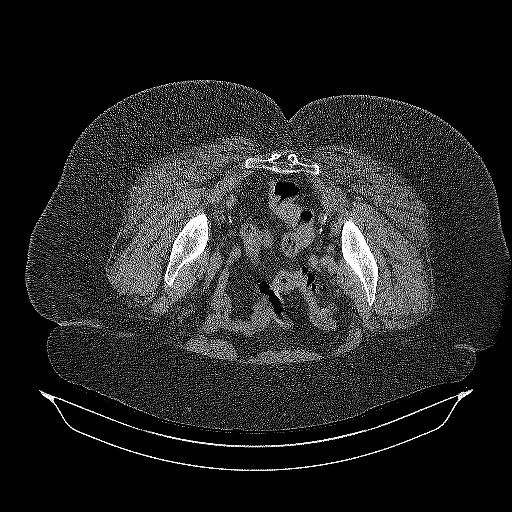
[im 20/39  soft-tissue]
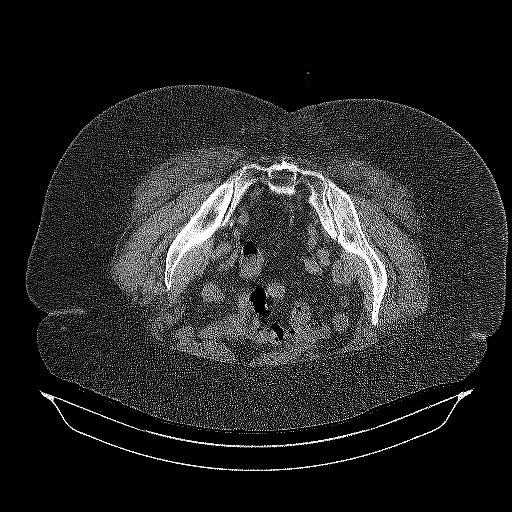
[im 29/39  soft-tissue]
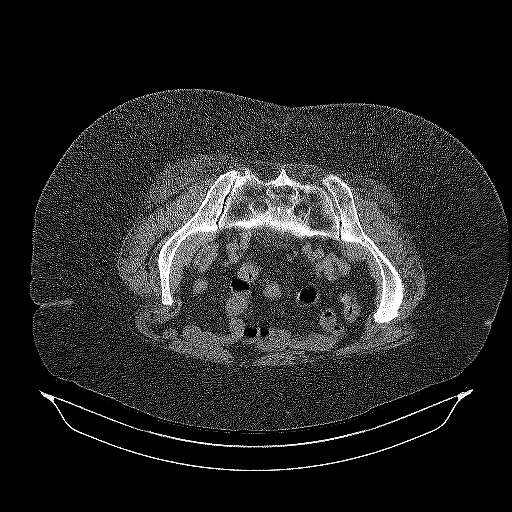

[14 of 32 positions shown; findings below may reference images not displayed]

EXAM:
CT GUIDED LEFT SI JOINT INJECTION

RADIATION DOSE REDUCTION: This exam was performed according to the
departmental dose-optimization program which includes automated
exposure control, adjustment of the mA and/or kV according to
patient size and/or use of iterative reconstruction technique.

PROCEDURE:
After a thorough discussion of risks and benefits of the procedure,
including bleeding, infection, injury to nerves, blood vessels, and
adjacent structures, verbal and written consent was obtained. The
patient was placed prone on the CT table and localization was
performed over the sacrum. Target site marked using CT guidance. The
skin was prepped and draped in the usual sterile fashion using
Betadine soap.

After local anesthesia with 1% lidocaine without epinephrine and
subsequent deep anesthesia, a 3.5 inch 22 gauge spinal needle was
advanced into the left SI joint under intermittent CT guidance.

Injection of a small amount of Isovue-M 200 demonstrated
intra-articular contrast spread. Subsequently, 2 mL of 0.25%
bupivacaine were injected into the left SI joint. The needle was
removed and a sterile dressing applied.

No complications were observed.
IMPRESSION: Successful CT-guided left SI joint injection.

## 2021-02-14 ENCOUNTER — Other Ambulatory Visit: Payer: Self-pay | Admitting: Family Medicine

## 2021-02-14 DIAGNOSIS — E785 Hyperlipidemia, unspecified: Secondary | ICD-10-CM

## 2021-02-15 ENCOUNTER — Other Ambulatory Visit: Payer: Self-pay

## 2021-02-15 ENCOUNTER — Ambulatory Visit
Admission: RE | Admit: 2021-02-15 | Discharge: 2021-02-15 | Disposition: A | Discharge status: Home / Self Care | Payer: Medicare HMO | Source: Ambulatory Visit | Attending: Orthopedic Surgery | Admitting: Orthopedic Surgery

## 2021-02-15 ENCOUNTER — Ambulatory Visit: Admission: RE | Admit: 2021-02-15 | Payer: Medicare HMO | Source: Ambulatory Visit

## 2021-02-15 DIAGNOSIS — M533 Sacrococcygeal disorders, not elsewhere classified: Secondary | ICD-10-CM | POA: Diagnosis not present

## 2021-02-15 DIAGNOSIS — G8929 Other chronic pain: Secondary | ICD-10-CM | POA: Diagnosis not present

## 2021-02-16 DIAGNOSIS — M533 Sacrococcygeal disorders, not elsewhere classified: Secondary | ICD-10-CM | POA: Diagnosis not present

## 2021-02-20 DIAGNOSIS — M533 Sacrococcygeal disorders, not elsewhere classified: Secondary | ICD-10-CM | POA: Diagnosis not present

## 2021-02-20 DIAGNOSIS — Z6835 Body mass index (BMI) 35.0-35.9, adult: Secondary | ICD-10-CM | POA: Diagnosis not present

## 2021-03-01 ENCOUNTER — Other Ambulatory Visit: Payer: Medicare HMO

## 2021-03-01 ENCOUNTER — Inpatient Hospital Stay: Admission: RE | Admit: 2021-03-01 | Payer: Medicare HMO | Source: Ambulatory Visit

## 2021-03-06 ENCOUNTER — Other Ambulatory Visit: Payer: Self-pay

## 2021-03-06 ENCOUNTER — Ambulatory Visit: Payer: Medicare HMO | Admitting: Cardiology

## 2021-03-06 VITALS — BP 126/60 | HR 64 | Ht 65.0 in | Wt 229.0 lb

## 2021-03-06 DIAGNOSIS — I471 Supraventricular tachycardia: Secondary | ICD-10-CM

## 2021-03-06 DIAGNOSIS — E669 Obesity, unspecified: Secondary | ICD-10-CM | POA: Diagnosis not present

## 2021-03-06 DIAGNOSIS — I10 Essential (primary) hypertension: Secondary | ICD-10-CM | POA: Diagnosis not present

## 2021-03-06 MED ORDER — DILTIAZEM HCL ER COATED BEADS 180 MG PO CP24: 180.0000 mg | ORAL_CAPSULE | Freq: Every day | ORAL | 3 refills | Status: DC

## 2021-03-06 NOTE — Progress Notes (Signed)
Cardiology Office Note:    Date:  03/07/2021   ID:  Jasmine Mclaughlin, DOB August 09, 1950, MRN [id number]  PCP:  Carollee Herter, Alferd Apa, DO  Cardiologist:  Berniece Salines, DO  Electrophysiologist:  None   Referring MD: Carollee Herter, Alferd Apa, *   " I am doing well"  History of Present Illness:    Jasmine Mclaughlin is a 71 y.o. female with a hx of mild coronary artery disease seen on coronary CT scan, COPD, current smoker, anxiety, paroxysmal SVT here today for follow-up visit.  I saw the patient in February 2022 she still was experiencing chest discomfort despite negative nuclear stress test we will proceed with a coronary CTA.  Interim the patient was able to get a coronary CTA done which showed mild coronary artery disease.  At that time she was also hypertensive I started patient on hydrochlorothiazide 12.5 mg daily.   I saw the patient on May 26, 2020 we talked about her testing results.  As well as we talked about smoking cessation.   I saw the patient November 28, 2020 at that time she experiencing palpitations.  I placed a monitor on the patient to make sure this is not atrial fibrillation.  And also gave her Cardizem 30 mg every 8 hours as needed for heart rate over 130.  In the interim she has not been able to use this medicine but she did wear her monitor. She is here today to discuss her monitor result.  I saw the patient January 12, 2021 at that time we discussed her monitor which showed evidence of paroxysmal SVT.  She is symptomatic I started patient on Cardizem 120 mg daily.  She has been experiencing some intermittent palpitations and chest discomfort.  Although not every day but still has this.   Past Medical History:  Diagnosis Date   Allergy    Anxiety    Arthritis    degenerative back, joint disturbance "everywhere "   Asthma    Chest tightness 10/27/2013   Colon polyps    COPD (chronic obstructive pulmonary disease) (HCC)    Generalized anxiety disorder 11/13/2012   Genital warts     Obesity (BMI 30-39.9) 10/14/2012   Osteoporosis    Pneumonia    Shortness of breath    pt./ reports that she is out of shape   Sinusitis, acute 10/27/2013   Tobacco abuse 09/28/2010   Wheezing 10/27/2013    Past Surgical History:  Procedure Laterality Date   ANTERIOR FUSION LUMBAR SPINE  05/06/2012   ANTERIOR LAT LUMBAR FUSION N/A 05/06/2012   Procedure: ANTERIOR LATERAL LUMBAR FUSION 3 LEVELS;  Surgeon: Sinclair Ship, MD;  Location: [facility];  Service: Orthopedics;  Laterality: N/A;  Lateral interbody fusion, lumbar 2-3,lumbar 3-4, lumbar 4-5 with instrumentation and allograft.   COLONOSCOPY     COLONOSCOPY WITH PROPOFOL N/A 12/15/2017   Procedure: COLONOSCOPY WITH PROPOFOL;  Surgeon: Rush Landmark Telford Nab., MD;  Location: [facility];  Service: Gastroenterology;  Laterality: N/A;   COLONOSCOPY WITH PROPOFOL N/A 07/08/2018   Procedure: COLONOSCOPY WITH PROPOFOL;  Surgeon: Rush Landmark Telford Nab., MD;  Location: [facility];  Service: Gastroenterology;  Laterality: N/A;   COLONOSCOPY WITH PROPOFOL N/A 09/01/2019   Procedure: COLONOSCOPY WITH PROPOFOL;  Surgeon: Rush Landmark Telford Nab., MD;  Location: [facility];  Service: Gastroenterology;  Laterality: N/A;   KNEE ARTHROSCOPY Right     R knee 01/2012, for ligament tears   POLYPECTOMY  07/08/2018   Procedure: POLYPECTOMY;  Surgeon: Justice Britain  Brooke Bonito., MD;  Location: [facility];  Service: Gastroenterology;;   POLYPECTOMY  09/01/2019   Procedure: POLYPECTOMY;  Surgeon: Irving Copas., MD;  Location: [facility];  Service: Gastroenterology;;   TONSILLECTOMY     VAGINAL DELIVERY     x1    Current Medications: Current Meds  Medication Sig   diltiazem (CARDIZEM CD) 180 MG 24 hr capsule Take 1 capsule (180 mg total) by mouth daily.     Allergies:   Patient has no known allergies.   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  Tobacco Use   Smoking status: Former    Packs/day: 0.50    Years: 55.00    Pack years: 27.50    Types: Cigarettes   Smokeless tobacco: Never  Vaping Use   Vaping Use: Never used  Substance and Sexual Activity   Alcohol use: Yes    Alcohol/week: 0.0 standard drinks    Comment: "sporatic"- wine, beer, mixed drink   Drug use: No   Sexual activity: Not Currently    Partners: Male  Other Topics Concern   Not on file  Social History Narrative   1 caffeine drinks daily   No exercise   Left handed   One story home   Social Determinants of Health   Financial Resource Strain: Low Risk    Difficulty of Paying Living Expenses: Not hard at all  Food Insecurity: No Food Insecurity   Worried About Charity fundraiser in the Last Year: Never true   Ran Out of Food in the Last Year: Never true  Transportation Needs: No Transportation Needs   Lack of Transportation (Medical): No   Lack of Transportation (Non-Medical): No  Physical Activity: Insufficiently Active   Days of Exercise per Week: 7 days   Minutes of Exercise per Session: 10 min  Stress: No Stress Concern Present   Feeling of Stress : Not at all  Social Connections: Not on file     Family History: The patient's family history includes Arthritis in her father and mother; Asthma in her daughter and maternal grandmother; Breast cancer in her maternal grandmother; Colon cancer (age of onset: 52) in her father; Heart disease (age of onset: 48) in her father; Stroke in her father. There is no history of Esophageal cancer, Ulcerative colitis, or Stomach cancer.  ROS:   Review of Systems  Constitution: Negative for decreased appetite, fever and weight gain.  HENT: Negative for congestion, ear discharge, hoarse voice and sore throat.   Eyes: Negative for discharge, redness,  vision loss in right eye and visual halos.  Cardiovascular: Negative for chest pain, dyspnea on exertion, leg swelling, orthopnea and palpitations.  Respiratory: Negative for cough, hemoptysis, shortness of breath and snoring.   Endocrine: Negative for heat intolerance and polyphagia.  Hematologic/Lymphatic: Negative for bleeding problem. Does not bruise/bleed easily.  Skin: Negative for flushing, nail changes, rash and suspicious lesions.  Musculoskeletal: Negative for arthritis, joint pain, muscle cramps, myalgias, neck pain and stiffness.  Gastrointestinal: Negative for abdominal pain, bowel incontinence, diarrhea and excessive appetite.  Genitourinary: Negative for decreased libido, genital sores and incomplete emptying.  Neurological: Negative for brief paralysis, focal weakness, headaches and loss of balance.  Psychiatric/Behavioral: Negative for altered mental status, depression and suicidal ideas.  Allergic/Immunologic: Negative for HIV exposure and persistent infections.    EKGs/Labs/Other Studies Reviewed:    The following studies were reviewed today:   EKG: None today  Zio monitor 12/2020 Patch Wear Time:  13 days and 21 hours starting December 04, 2020. Indication: Palpitations   Patient had a min HR of 47 bpm, max HR of 129 bpm, and avg HR of 69 bpm. Predominant underlying rhythm was Sinus Rhythm. 8 Supraventricular Tachycardia runs occurred, the run with the fastest interval lasting 6 beats with a max rate of 129 bpm, the longest lasting 11 beats with an avg rate of 115 bpm. Some episodes of Supraventricular Tachycardia may be possible Atrial Tachycardia with variable block. Isolated SVEs were occasional (1.9%, [id number]), SVE Couplets were rare (<1.0%, 212), and SVE Triplets  were rare (<1.0%, 16). Isolated VEs were rare (<1.0%), and no VE Couplets or VE Triplets were present. Ventricular Bigeminy and Trigeminy were present.     Conclusion: This study is remarkable for the  following:                       1.  Paroxysmal supraventricular tachycardia which is likely atrial tachycardia with variable block.                       2.  Occasional premature atrial complexes     Coronary CT done March 21, 2020 Noise artifact is: Moderate, patient motion.   Coronary calcium score is 136, which places the patient in the 80th percentile for age and sex matched control.   Coronary arteries: Normal coronary origins.  Right dominance.   Right Coronary Artery: Minimal atherosclerotic plaque in the proximal RCA, <25% stenosis.   Left Main Coronary Artery: No detectable plaque or stenosis.   Left Anterior Descending Coronary Artery: Minimal mixed atherosclerotic plaque in the proximal and mid LAD, <25% stenosis. There is significant slice misalignment due to patient motion in the mid LAD, cannot comment definitively on mid LAD, however surrounding vessel appears patent. Mild mixed atherosclerotic plaque in the mid-distal LAD, 25-49% stenosis.   Left Circumflex Artery: Minimal mixed atherosclerotic plaque in proximal and mid OM1, <25% stenosis.   Aorta: Normal size, 31 mm at the mid ascending aorta (level of the PA bifurcation) measured double oblique. No calcifications. No dissection.   Aortic Valve: No calcifications.   Other findings:   Normal variant pulmonary vein drainage into the left atrium, left sided pulmonary veins share a common antrum.   Normal left atrial appendage without thrombus.   Normal size of the pulmonary artery.   IMPRESSION: 1. Mild CAD in mid-distal LAD, CADRADS = 2. Motion artifact in mid LAD limits assessment of that segment.   2. Coronary calcium score is 136, which places the patient in the 80th percentile for age and sex matched control.   3. Normal coronary origin with right dominance.     Nuclear stress test Impression: 2021  1. Normal myocardial perfusion imaging study without evidence of ischemia or infarction.  2.  Normal LVEF, 62%. 3. Brief ectopic atrial rhythm (6 beat duration) captured on baseline ECGs. 4. This is a low-risk study.      Echo IMPRESSIONS March 2021  1. Left ventricular ejection fraction, by estimation, is 60 to 65%. The  left ventricle has normal function. The left ventricle has no regional  wall motion abnormalities. There is mild concentric left ventricular  hypertrophy. Left ventricular diastolic  parameters are consistent with Grade II diastolic dysfunction  (pseudonormalization). The average left ventricular global longitudinal  strain is -16.6 %.   2. Right ventricular systolic function is normal. The right ventricular  size is normal. There is normal pulmonary artery systolic pressure.   3. The mitral valve is normal in structure. Trivial mitral valve  regurgitation. No evidence of mitral stenosis.   4. The aortic valve is tricuspid. Aortic valve regurgitation is not  visualized. No aortic stenosis is present.   5. The inferior vena cava is normal in size with greater than 50%  respiratory variability, suggesting right atrial pressure of 3 mmHg.   FINDINGS   Left Ventricle: Left ventricular ejection fraction, by estimation, is 60  to 65%. The left ventricle has normal function. The left ventricle has no  regional wall motion abnormalities. The average left ventricular global  longitudinal strain is -16.6 %.  The left ventricular internal cavity size was normal in size. There is  mild concentric left ventricular hypertrophy. Left ventricular diastolic  parameters are consistent with Grade II diastolic dysfunction  (pseudonormalization).   Right Ventricle: The right ventricular size is normal. No increase in  right ventricular wall thickness. Right ventricular systolic function is  normal. There is normal pulmonary artery systolic pressure. The tricuspid  regurgitant velocity is 1.30 m/s, and   with an assumed right atrial pressure of 3 mmHg, the estimated right   ventricular systolic pressure is 9.8 mmHg.   Left Atrium: Left atrial size was normal in size.   Right Atrium: Right atrial size was normal in size.   Pericardium: Trivial pericardial effusion is present. The pericardial  effusion is posterior to the left ventricle.   Mitral Valve: The mitral valve is normal in structure. Normal mobility of  the mitral valve leaflets. Trivial mitral valve regurgitation. No evidence  of mitral valve stenosis.   Tricuspid Valve: The tricuspid valve is normal in structure. Tricuspid  valve regurgitation is not demonstrated. No evidence of tricuspid  stenosis.   Aortic Valve: The aortic valve is tricuspid. Aortic valve regurgitation is  not visualized. No aortic stenosis is present.   Pulmonic Valve: The pulmonic valve was normal in structure. Pulmonic valve  regurgitation is not visualized. No evidence of pulmonic stenosis.   Aorta: The aortic root and ascending aorta are structurally normal, with  no evidence of dilitation.   Venous: A normal flow pattern is recorded from the right upper pulmonary  vein. The inferior vena cava is normal in size with greater than 50%  respiratory variability, suggesting right atrial pressure of 3 mmHg.   IAS/Shunts: No atrial level shunt detected by color flow Doppler.     Recent Labs: 03/13/2020: Magnesium 2.0 11/10/2020: ALT 17; BUN 20; Creatinine, Ser 0.87; Hemoglobin 13.5; Platelets 339.0; Potassium 4.6; Sodium 139  Recent Lipid Panel    Component Value Date/Time   CHOL 145 11/10/2020 1006   TRIG 129.0 11/10/2020 1006   HDL 51.10 11/10/2020 1006   CHOLHDL 3 11/10/2020 1006   VLDL 25.8 11/10/2020 1006   LDLCALC 68 11/10/2020 1006   LDLCALC 117 (H) 09/23/2019 0952   LDLDIRECT 135.0 12/23/2019 1126    Physical Exam:    VS:  BP 126/60    Pulse 64    Ht 5\' 5"  (1.651 m)    Wt 229 lb (103.9 kg)    SpO2 96%    BMI 38.11 kg/m     Wt Readings from Last 3 Encounters:  03/06/21 229 lb (103.9 kg)  01/12/21  226 lb 9.6 oz (102.8 kg)  01/05/21 226 lb 12.8 oz (102.9 kg)     GEN: Well nourished, well developed in no acute distress HEENT: Normal NECK: No JVD; No carotid bruits LYMPHATICS: No lymphadenopathy CARDIAC: S1S2 noted,RRR, no murmurs, rubs, gallops RESPIRATORY:  Clear to auscultation without rales, wheezing or rhonchi  ABDOMEN: Soft, non-tender, non-distended, +bowel sounds, no guarding. EXTREMITIES: No edema, No cyanosis, no clubbing MUSCULOSKELETAL:  No deformity  SKIN: Warm and dry NEUROLOGIC:  Alert and oriented x 3, non-focal PSYCHIATRIC:  Normal affect, good insight  ASSESSMENT:    1. Primary hypertension   2. Obesity (BMI 30-39.9)   3. PSVT (paroxysmal supraventricular tachycardia) ([place])    PLAN:     1.  We will increase her Cardizem to 180 mg daily.  2.  The patient understands the need to lose weight with diet and exercise. We have discussed specific strategies for this.  The patient is in agreement with the above plan. The patient left the office in stable condition.  The patient will follow up in 1 year or sooner if needed.   Medication Adjustments/Labs and Tests Ordered: Current medicines are reviewed at length with the patient today.  Concerns regarding medicines are outlined above.  No orders of the defined types were placed in this encounter.  Meds ordered this encounter  Medications   diltiazem (CARDIZEM CD) 180 MG 24 hr capsule    Sig: Take 1 capsule (180 mg total) by mouth daily.    Dispense:  90 capsule    Refill:  3    Patient Instructions  Medication Instructions:  Your physician has recommended you make the following change in your medication:  INCREASE: Cardizem 180 mg once daily *If you need a refill on your cardiac medications before your next appointment, please call your pharmacy*   Lab Work: None If you have labs (blood work) drawn today and your tests are completely normal, you will receive your results only by: [place] (if  you have MyChart) OR A paper copy in the mail If you have any lab test that is abnormal or we need to change your treatment, we will call you to review the results.   Testing/Procedures: None   Follow-Up: At [facility], you and your health needs are our priority.  As part of our continuing mission to provide you with exceptional heart care, we have created designated Provider Care Teams.  These Care Teams include your primary Cardiologist (physician) and Advanced Practice Providers (APPs -  Physician Assistants and Nurse Practitioners) who all work together to provide you with the care you need, when you need it.  We recommend signing up for the patient portal called "MyChart".  Sign up information is provided on this After Visit Summary.  MyChart is used to connect with patients for Virtual Visits (Telemedicine).  Patients are able to view lab/test results, encounter notes, upcoming appointments, etc.  Non-urgent messages can be sent to your provider as well.   To learn more about what you can do with MyChart, go to [email or web address].    Your next appointment:   1 year(s)  The format for your next appointment:   In Person  Provider:   Berniece Salines, DO     Other Instructions     Adopting a Healthy Lifestyle.  Know what a healthy weight is for you (roughly BMI <25) and aim to maintain this   Aim for 7+ servings of fruits  and vegetables daily   65-80+ fluid ounces of water or unsweet tea for healthy kidneys   Limit to max 1 drink of alcohol per day; avoid smoking/tobacco   Limit animal fats in diet for cholesterol and heart health - choose grass fed whenever available   Avoid highly processed foods, and foods high in saturated/trans fats   Aim for low stress - take time to unwind and care for your mental health   Aim for 150 min of moderate intensity exercise weekly for heart health, and weights twice weekly for bone health   Aim for 7-9 hours of sleep daily    When it comes to diets, agreement about the perfect plan isnt easy to find, even among the experts. Experts at the [employer] developed an idea known as the Healthy Eating Plate. Just imagine a plate divided into logical, healthy portions.   The emphasis is on diet quality:   Load up on vegetables and fruits - one-half of your plate: Aim for color and variety, and remember that potatoes dont count.   Go for whole grains - one-quarter of your plate: Whole wheat, barley, wheat berries, quinoa, oats, brown rice, and foods made with them. If you want pasta, go with whole wheat pasta.   Protein power - one-quarter of your plate: Fish, chicken, beans, and nuts are all healthy, versatile protein sources. Limit red meat.   The diet, however, does go beyond the plate, offering a few other suggestions.   Use healthy plant oils, such as olive, canola, soy, corn, sunflower and peanut. Check the labels, and avoid partially hydrogenated oil, which have unhealthy trans fats.   If youre thirsty, drink water. Coffee and tea are good in moderation, but skip sugary drinks and limit milk and dairy products to one or two daily servings.   The type of carbohydrate in the diet is more important than the amount. Some sources of carbohydrates, such as vegetables, fruits, whole grains, and beans-are healthier than others.   Finally, stay active  Signed, Berniece Salines, DO  03/07/2021 2:16 PM    [facility]

## 2021-03-06 NOTE — Patient Instructions (Signed)
Medication Instructions:  Your physician has recommended you make the following change in your medication:  INCREASE: Cardizem 180 mg once daily *If you need a refill on your cardiac medications before your next appointment, please call your pharmacy*   Lab Work: None If you have labs (blood work) drawn today and your tests are completely normal, you will receive your results only by: [place] (if you have MyChart) OR A paper copy in the mail If you have any lab test that is abnormal or we need to change your treatment, we will call you to review the results.   Testing/Procedures: None   Follow-Up: At [facility], you and your health needs are our priority.  As part of our continuing mission to provide you with exceptional heart care, we have created designated Provider Care Teams.  These Care Teams include your primary Cardiologist (physician) and Advanced Practice Providers (APPs -  Physician Assistants and Nurse Practitioners) who all work together to provide you with the care you need, when you need it.  We recommend signing up for the patient portal called "MyChart".  Sign up information is provided on this After Visit Summary.  MyChart is used to connect with patients for Virtual Visits (Telemedicine).  Patients are able to view lab/test results, encounter notes, upcoming appointments, etc.  Non-urgent messages can be sent to your provider as well.   To learn more about what you can do with MyChart, go to [email or web address].    Your next appointment:   1 year(s)  The format for your next appointment:   In Person  Provider:   Berniece Salines, DO     Other Instructions

## 2021-03-07 ENCOUNTER — Encounter: Payer: Self-pay | Admitting: Cardiology

## 2021-03-09 ENCOUNTER — Encounter: Payer: Self-pay | Admitting: Family Medicine

## 2021-03-09 DIAGNOSIS — M62838 Other muscle spasm: Secondary | ICD-10-CM

## 2021-03-09 MED ORDER — TIZANIDINE HCL 4 MG PO CAPS: 4.0000 mg | ORAL_CAPSULE | Freq: Three times a day (TID) | ORAL | 2 refills | Status: DC | PRN

## 2021-03-13 DIAGNOSIS — M533 Sacrococcygeal disorders, not elsewhere classified: Secondary | ICD-10-CM | POA: Diagnosis not present

## 2021-04-10 DIAGNOSIS — M533 Sacrococcygeal disorders, not elsewhere classified: Secondary | ICD-10-CM | POA: Diagnosis not present

## 2021-04-19 ENCOUNTER — Ambulatory Visit
Admission: RE | Admit: 2021-04-19 | Discharge: 2021-04-19 | Disposition: A | Discharge status: Home / Self Care | Payer: Medicare HMO | Source: Ambulatory Visit | Attending: Family Medicine | Admitting: Family Medicine

## 2021-04-19 DIAGNOSIS — M85852 Other specified disorders of bone density and structure, left thigh: Secondary | ICD-10-CM | POA: Diagnosis not present

## 2021-04-19 DIAGNOSIS — Z1231 Encounter for screening mammogram for malignant neoplasm of breast: Secondary | ICD-10-CM

## 2021-04-19 DIAGNOSIS — M810 Age-related osteoporosis without current pathological fracture: Secondary | ICD-10-CM | POA: Diagnosis not present

## 2021-04-19 DIAGNOSIS — Z780 Asymptomatic menopausal state: Secondary | ICD-10-CM

## 2021-04-19 IMAGING — MG MM DIGITAL SCREENING BILAT W/ TOMO AND CAD
8 series · 8 of 24 positions shown · non-contrast
Comparison: Previous exam(s).

CLINICAL DATA: Screening.

EXAM:
DIGITAL SCREENING BILATERAL MAMMOGRAM WITH TOMOSYNTHESIS AND CAD
TECHNIQUE: Bilateral screening digital craniocaudal and mediolateral oblique
mammograms were obtained. Bilateral screening digital breast
tomosynthesis was performed. The images were evaluated with
computer-aided detection.

[L CC synth-2D]
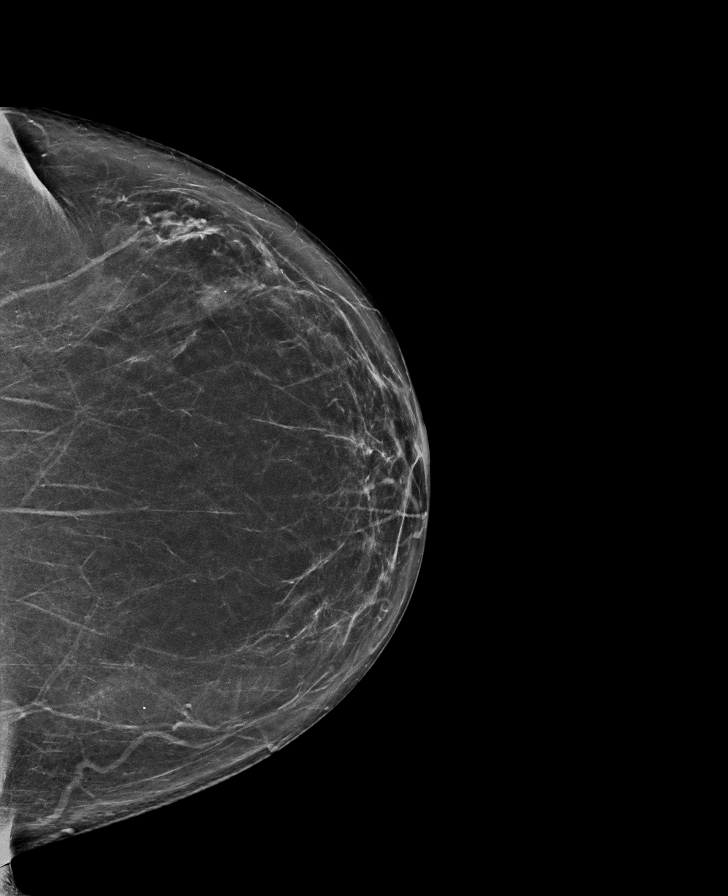

[L MLO synth-2D]
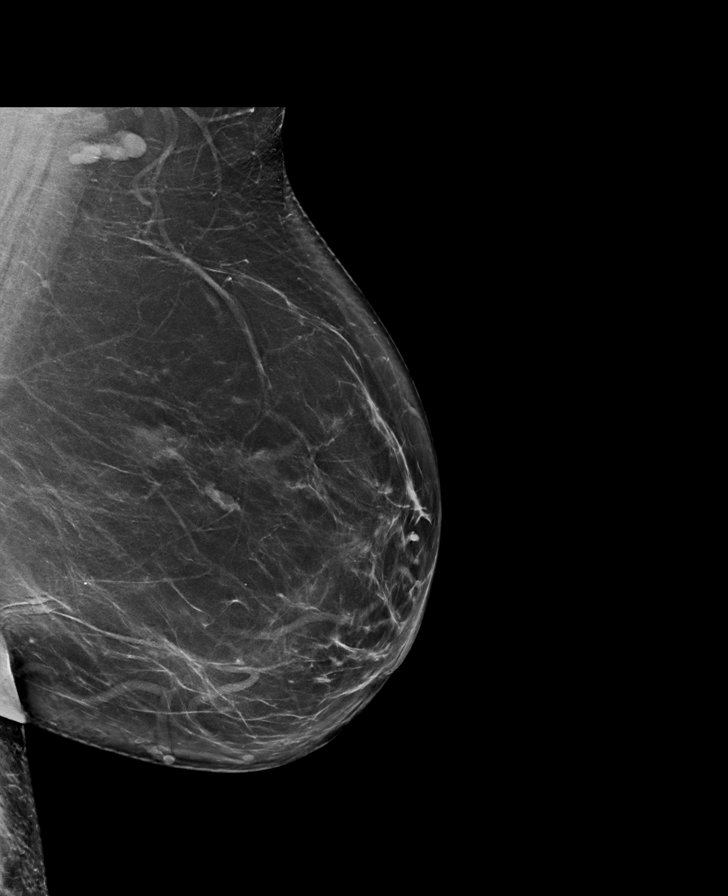

[R MLO synth-2D]
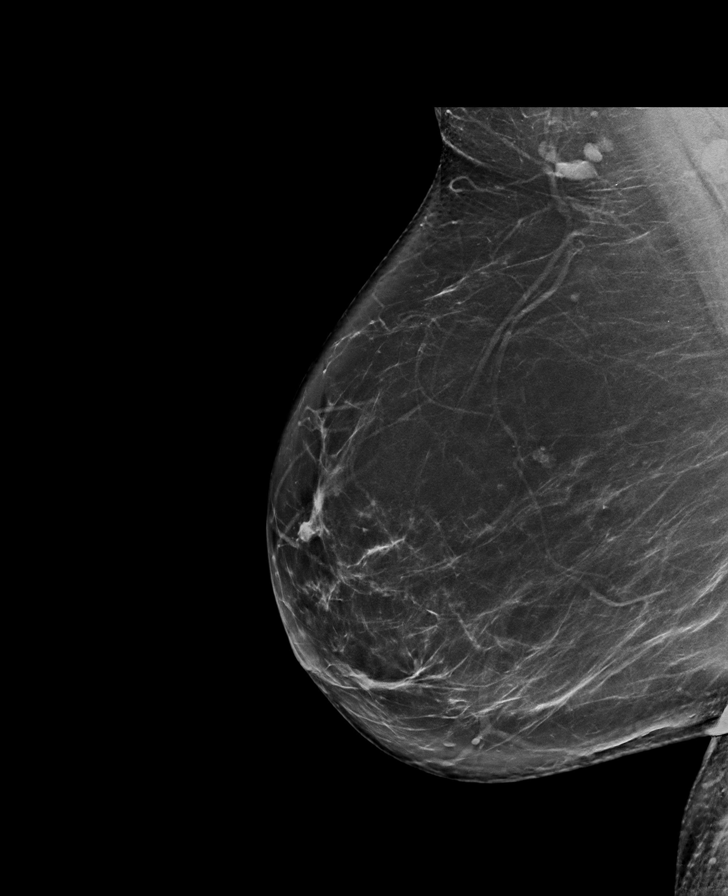

[R CC synth-2D]
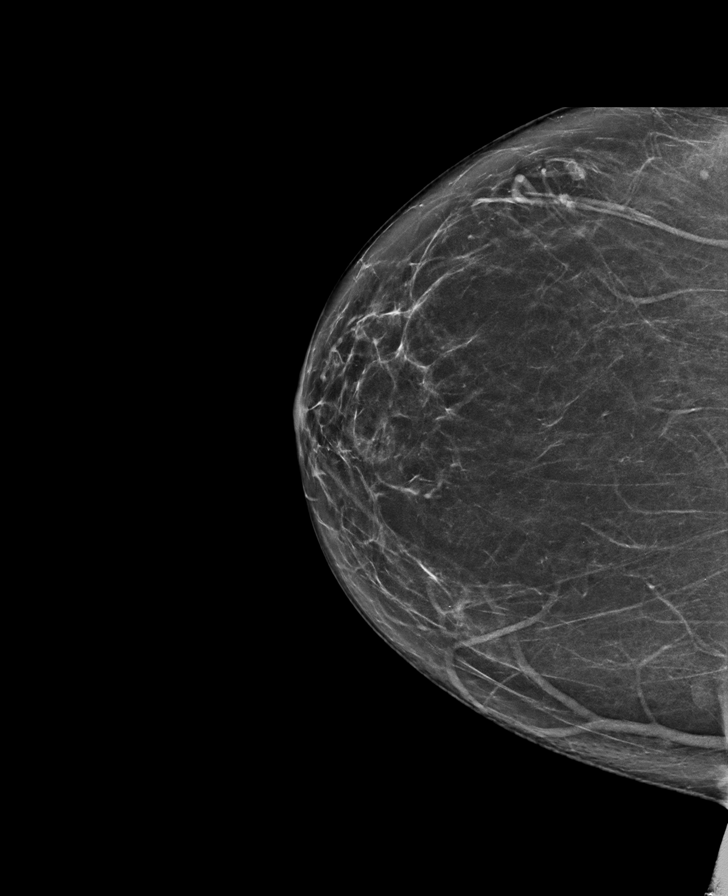

[R MLO tomo · tomo slice 47/92.0]
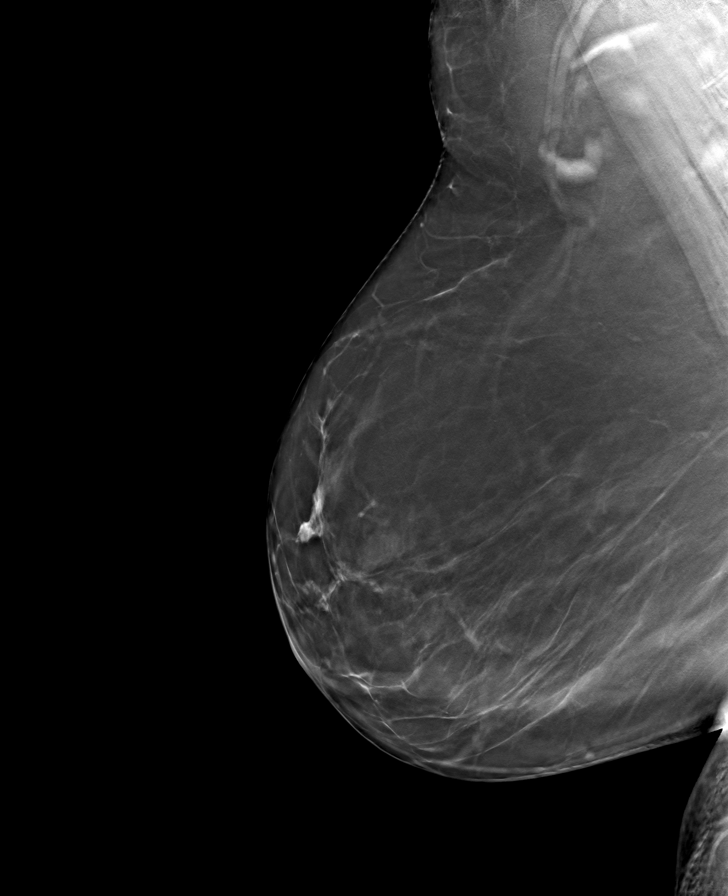

[L CC tomo · tomo slice 39/77.0]
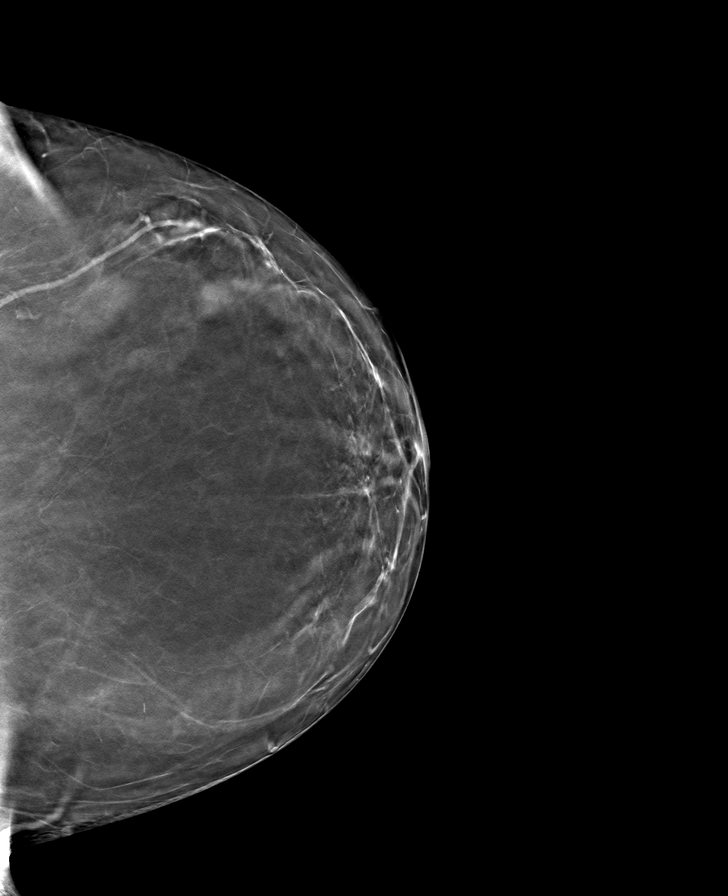

[R CC tomo · tomo slice 39/78.0]
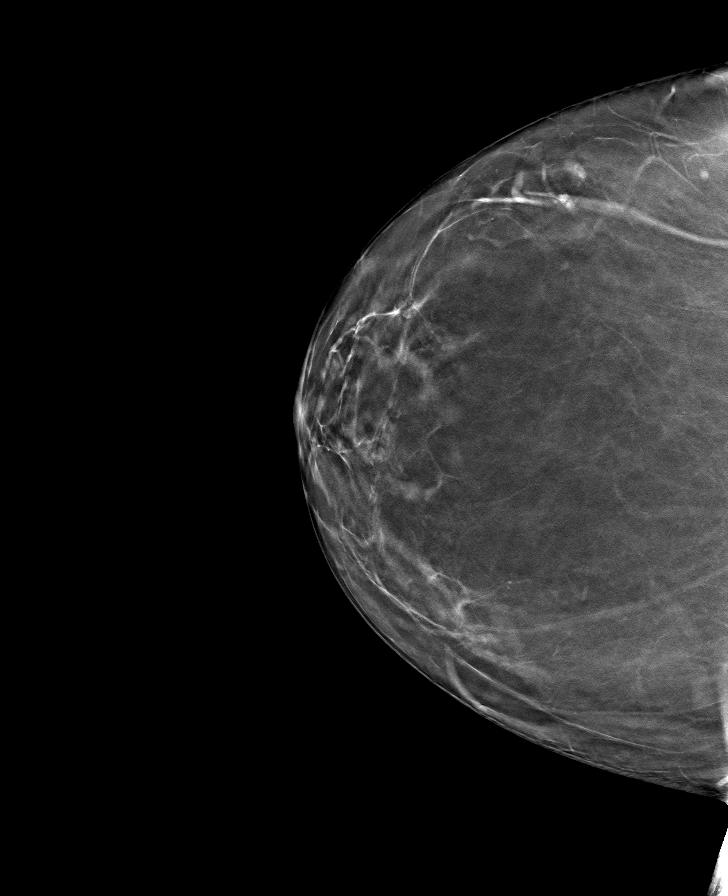

[L MLO tomo · tomo slice 47/93.0]
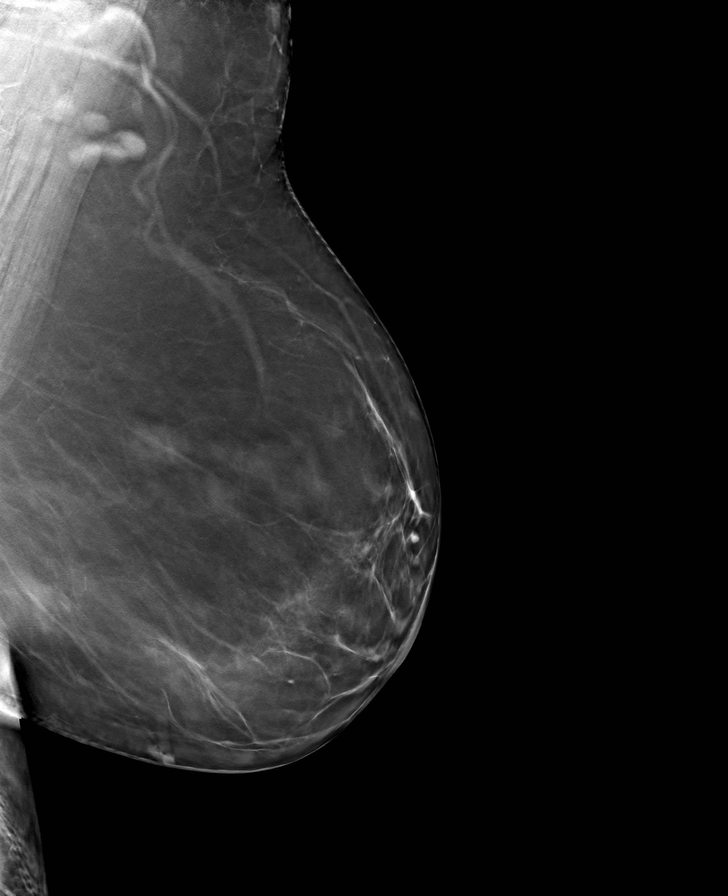

[8 of 24 positions shown; findings below may reference images not displayed]

ACR Breast Density Category b: There are scattered areas of
fibroglandular density.
FINDINGS: There are no findings suspicious for malignancy.
IMPRESSION: No mammographic evidence of malignancy. A result letter of this
screening mammogram will be mailed directly to the patient.

RECOMMENDATION:
Screening mammogram in one year. (Code:[BY])

BI-RADS CATEGORY  1: Negative.

## 2021-04-24 DIAGNOSIS — M1712 Unilateral primary osteoarthritis, left knee: Secondary | ICD-10-CM | POA: Diagnosis not present

## 2021-05-07 ENCOUNTER — Encounter: Payer: Self-pay | Admitting: Psychology

## 2021-05-07 NOTE — Progress Notes (Signed)
? ?NEUROPSYCHOLOGICAL EVALUATION ?[place] ?[place] Department of Neurology ? ?Date of Evaluation: May 08, 2021 ? ?Reason for Referral:  ? ?Jasmine Mclaughlin is a 71 y.o. left-handed Caucasian female referred by  Jasmine Mclaughlin, D.O. , to characterize her current cognitive functioning and assist with diagnostic clarity and treatment planning in the context of subjective cognitive decline.  ? ?Assessment and Plan:  ? ?Clinical Impression(s): ?Jasmine Mclaughlin's pattern of performance is suggestive of an isolated weakness across phonemic fluency. She also exhibited very mild performance variability across encoding (i.e., learning) and retrieval aspects of memory. However, performances were largely appropriate. Performances were also appropriate across processing speed, attention/concentration, executive dysfunction, safety/judgment, receptive language, confrontation naming, visuospatial abilities, and recognition/consolidation aspects of memory. Jasmine Mclaughlin denied difficulties completing instrumental activities of daily living (ADLs) independently. I do not feel that her current presentation arises to where a formal cognitive disorder is warranted.  ? ?Regarding etiology, Jasmine Mclaughlin reported acute symptoms of moderate anxiety and severe depression occurring during the past 1-2 weeks. She also described mild sleep dysfunction across a related questionnaire. Neurologically, recent neuroimaging suggested moderate to extensive chronic microvascular ischemic changes. Weaknesses in encoding/retrieval aspects of memory and verbal fluency are commonly associated with all of these variables. At the present time, it appears most likely that a combination of said variables best account for testing results as well as subjective concerns held by Jasmine Mclaughlin and her family. ? ?Specific to memory, despite some variability Jasmine Mclaughlin was generally able to learn novel verbal and visual information efficiently and retain this knowledge  after lengthy delays. Overall, memory performance combined with intact performances across other areas of cognitive functioning is not suggestive of Alzheimer's disease. Likewise, her cognitive and behavioral profile is not suggestive of any other form of neurodegenerative illness presently. ? ?Recommendations: ?A combination of medication and psychotherapy has been shown to be most effective at treating symptoms of anxiety and depression. As such, Jasmine Mclaughlin is encouraged to speak with her prescribing physician regarding medication adjustments to optimally manage these symptoms. Likewise, Jasmine Mclaughlin is encouraged to consider engaging in short-term psychotherapy to address symptoms of psychiatric distress. She would benefit from an active and collaborative therapeutic environment, rather than one purely supportive in nature. Recommended treatment modalities include Cognitive Behavioral Therapy (CBT) or Acceptance and Commitment Therapy (ACT). ? ?Should concerns surrounding cognitive abilities persist even after mood-related dysfunction is better managed, a repeat evaluation would be warranted at that time. The current evaluation will serve as an excellent baseline to make future comparisons against.  ? ?Jasmine Mclaughlin is encouraged to attend to lifestyle factors for good cardiovascular health. This would include regular physical exercise, implementation of a heart healthy diet, the elimination of tobacco use, the elimination or at least notable reduction of daily alcohol consumption, and assurances that current cardiovascular medical ailments are being treated appropriately at the present time. In addition to these, regular participation in cognitively-stimulating activities and general stress management techniques can also be helpful for overall physical and brain health. Continued participation in activities which provide social interaction is also recommended.  ? ?She could discuss referrals for a sleep study and audiologic  exam with her PCP. ? ?Performance across neurocognitive testing is not a strong predictor of an individual's safety operating a motor vehicle. Should her family wish to pursue a formalized driving evaluation, they could reach out to the following agencies: ?[employer] in [place]: [phone] ?[facility]: [phone] ?[facility]:  [phone] ?[facility]: [phone] or [phone] ? ?When learning new information, she would benefit from information being broken up into small, manageable pieces. She may also find it helpful to articulate the material in her own words and in a context to promote encoding at the onset of a new task. This material may need to be repeated multiple times to promote encoding. ? ?Memory can be improved using internal strategies such as rehearsal, repetition, chunking, mnemonics, association, and imagery. External strategies such as written notes in a consistently used memory journal, visual and nonverbal auditory cues such as a calendar on the refrigerator or appointments with alarm, such as on a cell phone, can also help maximize recall.   ? ?To address problems with fluctuating attention, she may wish to consider: ?  -Avoiding external distractions when needing to concentrate ?  -Limiting exposure to fast paced environments with multiple sensory demands ?  -Writing down complicated information and using checklists ?  -Attempting and completing one task at a time (i.e., no multi-tasking) ?  -Verbalizing aloud each step of a task to maintain focus ?  -Reducing the amount of information considered at one time ? ?Review of Records:  ? ?[name] was seen by her PCP Jasmine Mclaughlin, D.O.) on 11/10/2020 for her annual wellness check-up. At that time, she reported symptoms of ongoing memory loss. She also described instances having a harder time finding the words she wishes to use. Ultimately, Jasmine Mclaughlin was referred for a comprehensive  neuropsychological evaluation to characterize her cognitive abilities and to assist with diagnostic clarity and treatment planning.  ? ?She was seen by [facility] Metta Clines, D.O.) on 01/05/2021 for follow-up of frequent falls. Briefly, she underwent a lumbar fusion several years prior. She had some tingling in her feet afterwards that resolved but then returned a couple of years later. Over the past couple of years, she reported shooting pain in her toes of both feet. While sitting, she finds herself constantly wiggling her toes. In bed, she feels aching in her legs below the knees. She tries moving her legs in bed which is ineffective and she has to get up and walk around at night. Medical records suggest a history of degenerative disc disease of the lumbar spine, as well as osteoarthritis and hip pain. She reported decreased feeling in her feet, doesn't feel stable when standing, and has had multiple falls over the past year. Unsteady gait with frequent falls was thought to be secondary to residual lumbar radiculopathy/chronic pain. There was also mention of benign paroxysmal positional vertigo. ? ?Brain MRI on 05/04/2020 revealed moderate to extensive microvascular ischemic changes affecting the white matter, basal ganglia, and pons. ? ?Past Medical History:  ?Diagnosis Date  ? Allergic rhinitis 01/24/2015  ? Asthma   ? Atypical chest pain 08/25/2019  ? Chest tightness 10/27/2013  ? COPD (chronic obstructive pulmonary disease)   ? Degenerative disc disease, lumbar 10/14/2012  ? Generalized anxiety disorder 11/13/2012  ? Genital warts   ? History of COVID-19 08/2020  ? Hyperlipidemia 08/08/2020  ? Major depressive disorder   ? Obesity (BMI 30-39.9) 10/14/2012  ? Osteoarthritis 09/28/2010  ? Osteoporosis   ? Palpitations 11/28/2020  ? Pneumonia   ? Primary hypertension 12/23/2019  ? Shortness of breath   ? Sinusitis, acute 10/27/2013  ? Tobacco abuse 09/28/2010  ? Tubular adenoma of colon 11/18/2017  ?  Vitamin D deficiency 12/23/2019  ? Wheezing 10/27/2013  ?  ?Past Surgical History:  ?Procedure Laterality Date  ?  ANTERIOR FUSION LUMBAR SPINE  05/06/2012  ? ANTERIOR LAT LUMBAR FUSION N/A 05/06/2012  ? Procedu

## 2021-05-08 ENCOUNTER — Ambulatory Visit: Payer: Medicare HMO

## 2021-05-08 ENCOUNTER — Encounter: Payer: Medicare HMO | Admitting: Psychology

## 2021-05-08 ENCOUNTER — Ambulatory Visit (INDEPENDENT_AMBULATORY_CARE_PROVIDER_SITE_OTHER): Payer: Medicare HMO | Admitting: Psychology

## 2021-05-08 ENCOUNTER — Encounter: Payer: Self-pay | Admitting: Psychology

## 2021-05-08 DIAGNOSIS — F411 Generalized anxiety disorder: Secondary | ICD-10-CM

## 2021-05-08 DIAGNOSIS — F332 Major depressive disorder, recurrent severe without psychotic features: Secondary | ICD-10-CM

## 2021-05-08 DIAGNOSIS — R4189 Other symptoms and signs involving cognitive functions and awareness: Secondary | ICD-10-CM

## 2021-05-08 DIAGNOSIS — F329 Major depressive disorder, single episode, unspecified: Secondary | ICD-10-CM | POA: Insufficient documentation

## 2021-05-08 DIAGNOSIS — I6781 Acute cerebrovascular insufficiency: Secondary | ICD-10-CM | POA: Diagnosis not present

## 2021-05-08 NOTE — Progress Notes (Signed)
? ?  Psychometrician Note ?  ?Cognitive testing was administered to [place] by Cruzita Lederer, B.S. (psychometrist) under the supervision of Dr. Christia Reading, Ph.D., licensed psychologist on 05/08/2021. Jasmine Mclaughlin did not appear overtly distressed by the testing session per behavioral observation or responses across self-report questionnaires. Rest breaks were offered.  ?  ?The battery of tests administered was selected by Dr. Christia Reading, Ph.D. with consideration to Jasmine Mclaughlin's current level of functioning, the nature of her symptoms, emotional and behavioral responses during interview, level of literacy, observed level of motivation/effort, and the nature of the referral question. This battery was communicated to the psychometrist. Communication between Dr. Christia Reading, Ph.D. and the psychometrist was ongoing throughout the evaluation and Dr. Christia Reading, Ph.D. was immediately accessible at all times. Dr. Christia Reading, Ph.D. provided supervision to the psychometrist on the date of this service to the extent necessary to assure the quality of all services provided.  ?  ?Jasmine Mclaughlin will return within approximately 1-2 weeks for an interactive feedback session with Dr. Melvyn Novas at which time her test performances, clinical impressions, and treatment recommendations will be reviewed in detail. Jasmine Mclaughlin understands she can contact our office should she require our assistance before this time. ? ?A total of 125 minutes of billable time were spent face-to-face with Jasmine Mclaughlin by the psychometrist. This includes both test administration and scoring time. Billing for these services is reflected in the clinical report generated by Dr. Christia Reading, Ph.D. ? ?This note reflects time spent with the psychometrician and does not include test scores or any clinical interpretations made by Dr. Melvyn Novas. The full report will follow in a separate note.  ?

## 2021-05-10 ENCOUNTER — Telehealth: Payer: Self-pay

## 2021-05-10 ENCOUNTER — Ambulatory Visit (INDEPENDENT_AMBULATORY_CARE_PROVIDER_SITE_OTHER): Payer: Medicare HMO | Admitting: Family Medicine

## 2021-05-10 ENCOUNTER — Ambulatory Visit (HOSPITAL_BASED_OUTPATIENT_CLINIC_OR_DEPARTMENT_OTHER)
Admission: RE | Admit: 2021-05-10 | Discharge: 2021-05-10 | Disposition: A | Discharge status: Home / Self Care | Payer: Medicare HMO | Source: Ambulatory Visit | Attending: Family Medicine | Admitting: Family Medicine

## 2021-05-10 ENCOUNTER — Encounter: Payer: Self-pay | Admitting: Family Medicine

## 2021-05-10 VITALS — BP 122/78 | HR 76 | Temp 98.5°F | Resp 20 | Ht 65.0 in | Wt 227.0 lb

## 2021-05-10 DIAGNOSIS — F418 Other specified anxiety disorders: Secondary | ICD-10-CM | POA: Diagnosis not present

## 2021-05-10 DIAGNOSIS — I10 Essential (primary) hypertension: Secondary | ICD-10-CM

## 2021-05-10 DIAGNOSIS — R0609 Other forms of dyspnea: Secondary | ICD-10-CM

## 2021-05-10 DIAGNOSIS — M62838 Other muscle spasm: Secondary | ICD-10-CM

## 2021-05-10 DIAGNOSIS — E785 Hyperlipidemia, unspecified: Secondary | ICD-10-CM | POA: Diagnosis not present

## 2021-05-10 DIAGNOSIS — R69 Illness, unspecified: Secondary | ICD-10-CM | POA: Diagnosis not present

## 2021-05-10 DIAGNOSIS — R0602 Shortness of breath: Secondary | ICD-10-CM | POA: Diagnosis not present

## 2021-05-10 DIAGNOSIS — M810 Age-related osteoporosis without current pathological fracture: Secondary | ICD-10-CM

## 2021-05-10 LAB — CBC WITH DIFFERENTIAL/PLATELET
Basophils Absolute: 0.1 10*3/uL (ref 0.0–0.1)
Basophils Relative: 1.1 % (ref 0.0–3.0)
Eosinophils Absolute: 0.2 10*3/uL (ref 0.0–0.7)
Eosinophils Relative: 1.4 % (ref 0.0–5.0)
HCT: 40.4 % (ref 36.0–46.0)
Hemoglobin: 13.3 g/dL (ref 12.0–15.0)
Lymphocytes Relative: 23.1 % (ref 12.0–46.0)
Lymphs Abs: 2.9 10*3/uL (ref 0.7–4.0)
MCHC: 32.9 g/dL (ref 30.0–36.0)
MCV: 102.1 fl — ABNORMAL HIGH (ref 78.0–100.0)
Monocytes Absolute: 0.9 10*3/uL (ref 0.1–1.0)
Monocytes Relative: 7.3 % (ref 3.0–12.0)
Neutro Abs: 8.4 10*3/uL — ABNORMAL HIGH (ref 1.4–7.7)
Neutrophils Relative %: 67.1 % (ref 43.0–77.0)
Platelets: 355 10*3/uL (ref 150.0–400.0)
RBC: 3.96 Mil/uL (ref 3.87–5.11)
RDW: 15.5 % (ref 11.5–15.5)
WBC: 12.5 10*3/uL — ABNORMAL HIGH (ref 4.0–10.5)

## 2021-05-10 LAB — LIPID PANEL
Cholesterol: 142 mg/dL (ref 0–200)
HDL: 53.9 mg/dL (ref >39.00)
LDL Cholesterol: 53 mg/dL (ref 0–99)
NonHDL: 87.66
Total CHOL/HDL Ratio: 3
Triglycerides: 171 mg/dL — ABNORMAL HIGH (ref 0.0–149.0)
VLDL: 34.2 mg/dL (ref 0.0–40.0)

## 2021-05-10 LAB — COMPREHENSIVE METABOLIC PANEL
ALT: 14 U/L (ref 0–35)
AST: 14 U/L (ref 0–37)
Albumin: 4.5 g/dL (ref 3.5–5.2)
Alkaline Phosphatase: 70 U/L (ref 39–117)
BUN: 21 mg/dL (ref 6–23)
CO2: 29 mEq/L (ref 19–32)
Calcium: 9.9 mg/dL (ref 8.4–10.5)
Chloride: 97 mEq/L (ref 96–112)
Creatinine, Ser: 0.91 mg/dL (ref 0.40–1.20)
GFR: 64.18 mL/min (ref >60.00)
Glucose, Bld: 95 mg/dL (ref 70–99)
Potassium: 4.5 mEq/L (ref 3.5–5.1)
Sodium: 136 mEq/L (ref 135–145)
Total Bilirubin: 0.5 mg/dL (ref 0.2–1.2)
Total Protein: 7.2 g/dL (ref 6.0–8.3)

## 2021-05-10 IMAGING — DX DG CHEST 2V
2 series · 2 of 2 positions shown · non-contrast
Comparison: Chest two views [DATE]

CLINICAL DATA: Shortness of breath.

EXAM:
CHEST - 2 VIEW

[chest pa]
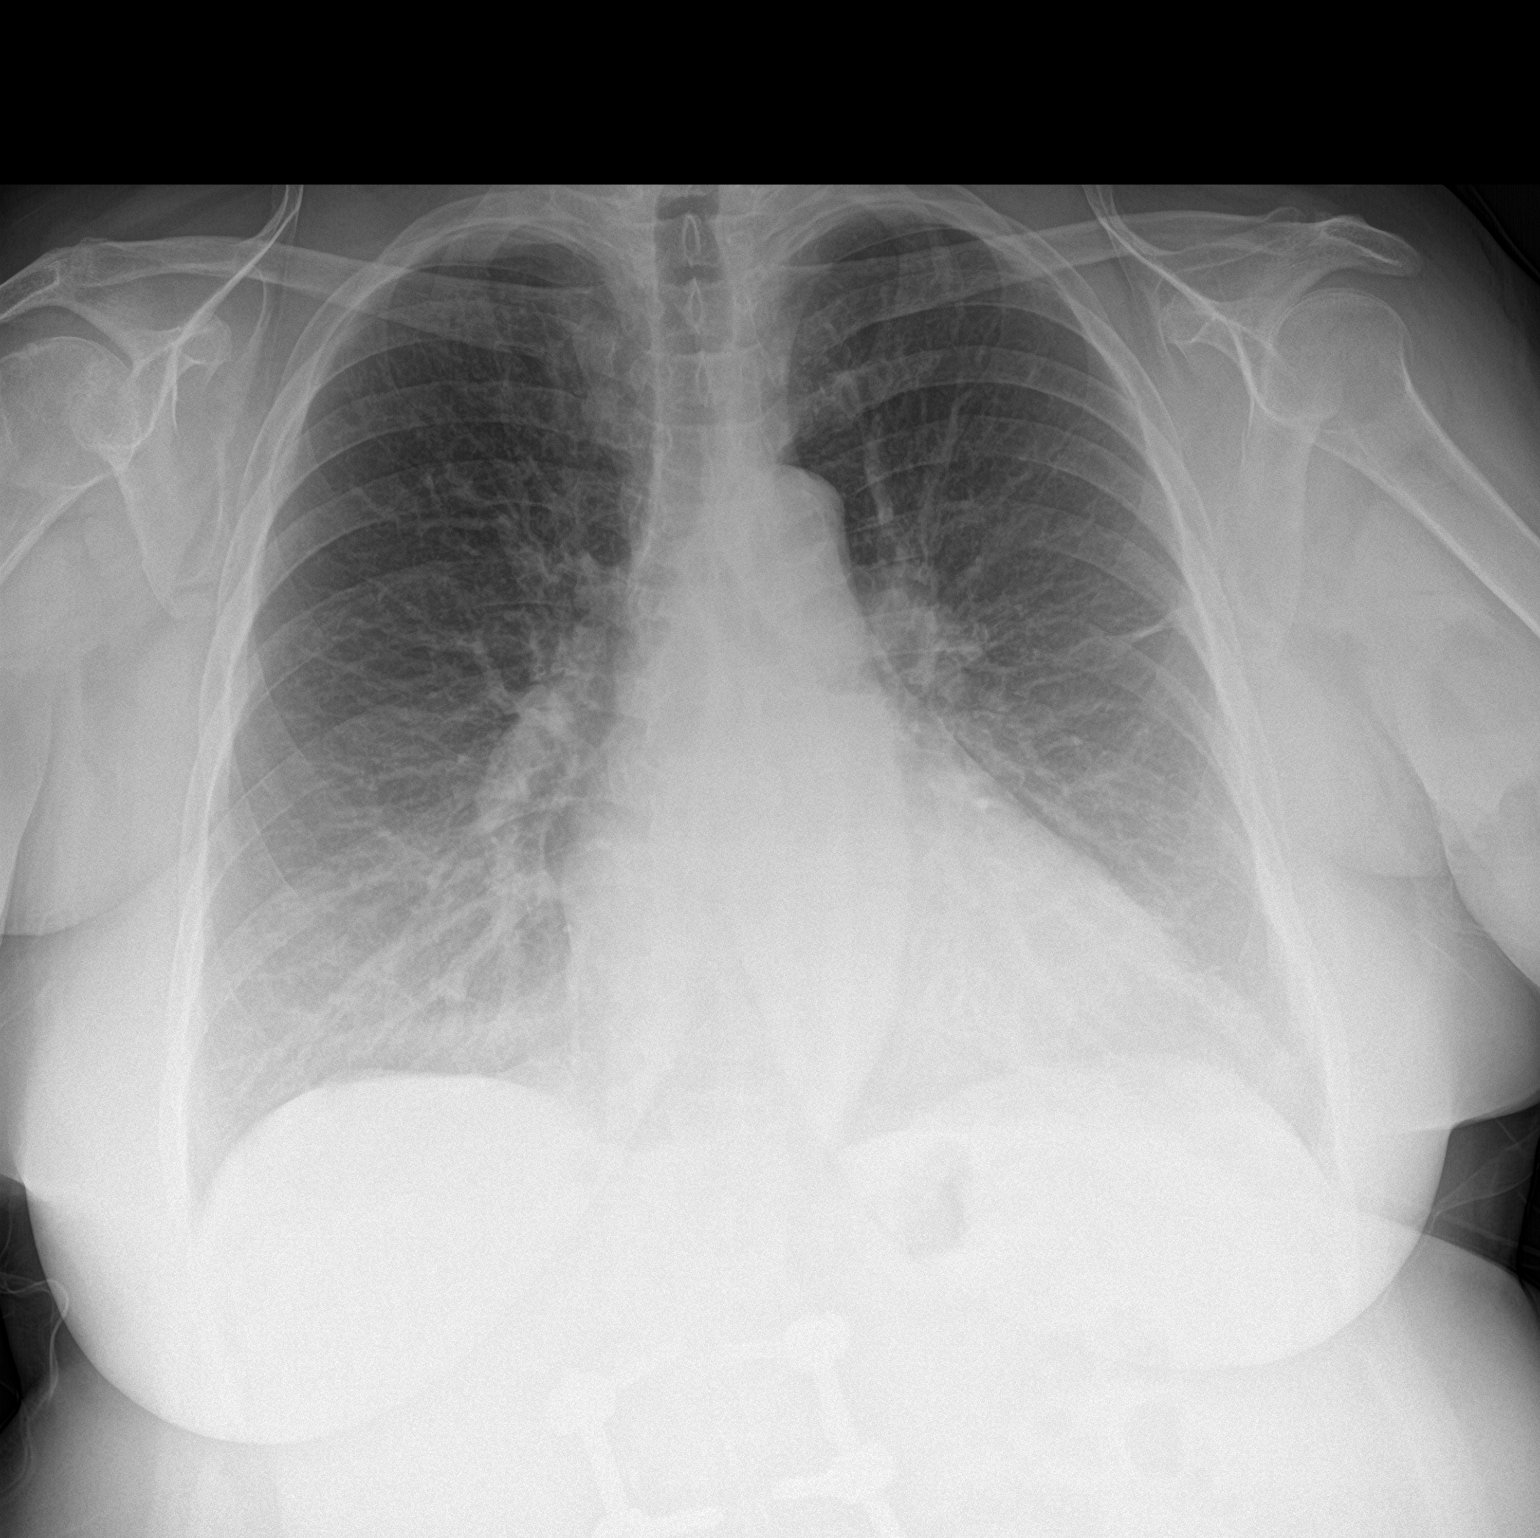

[chest lat]
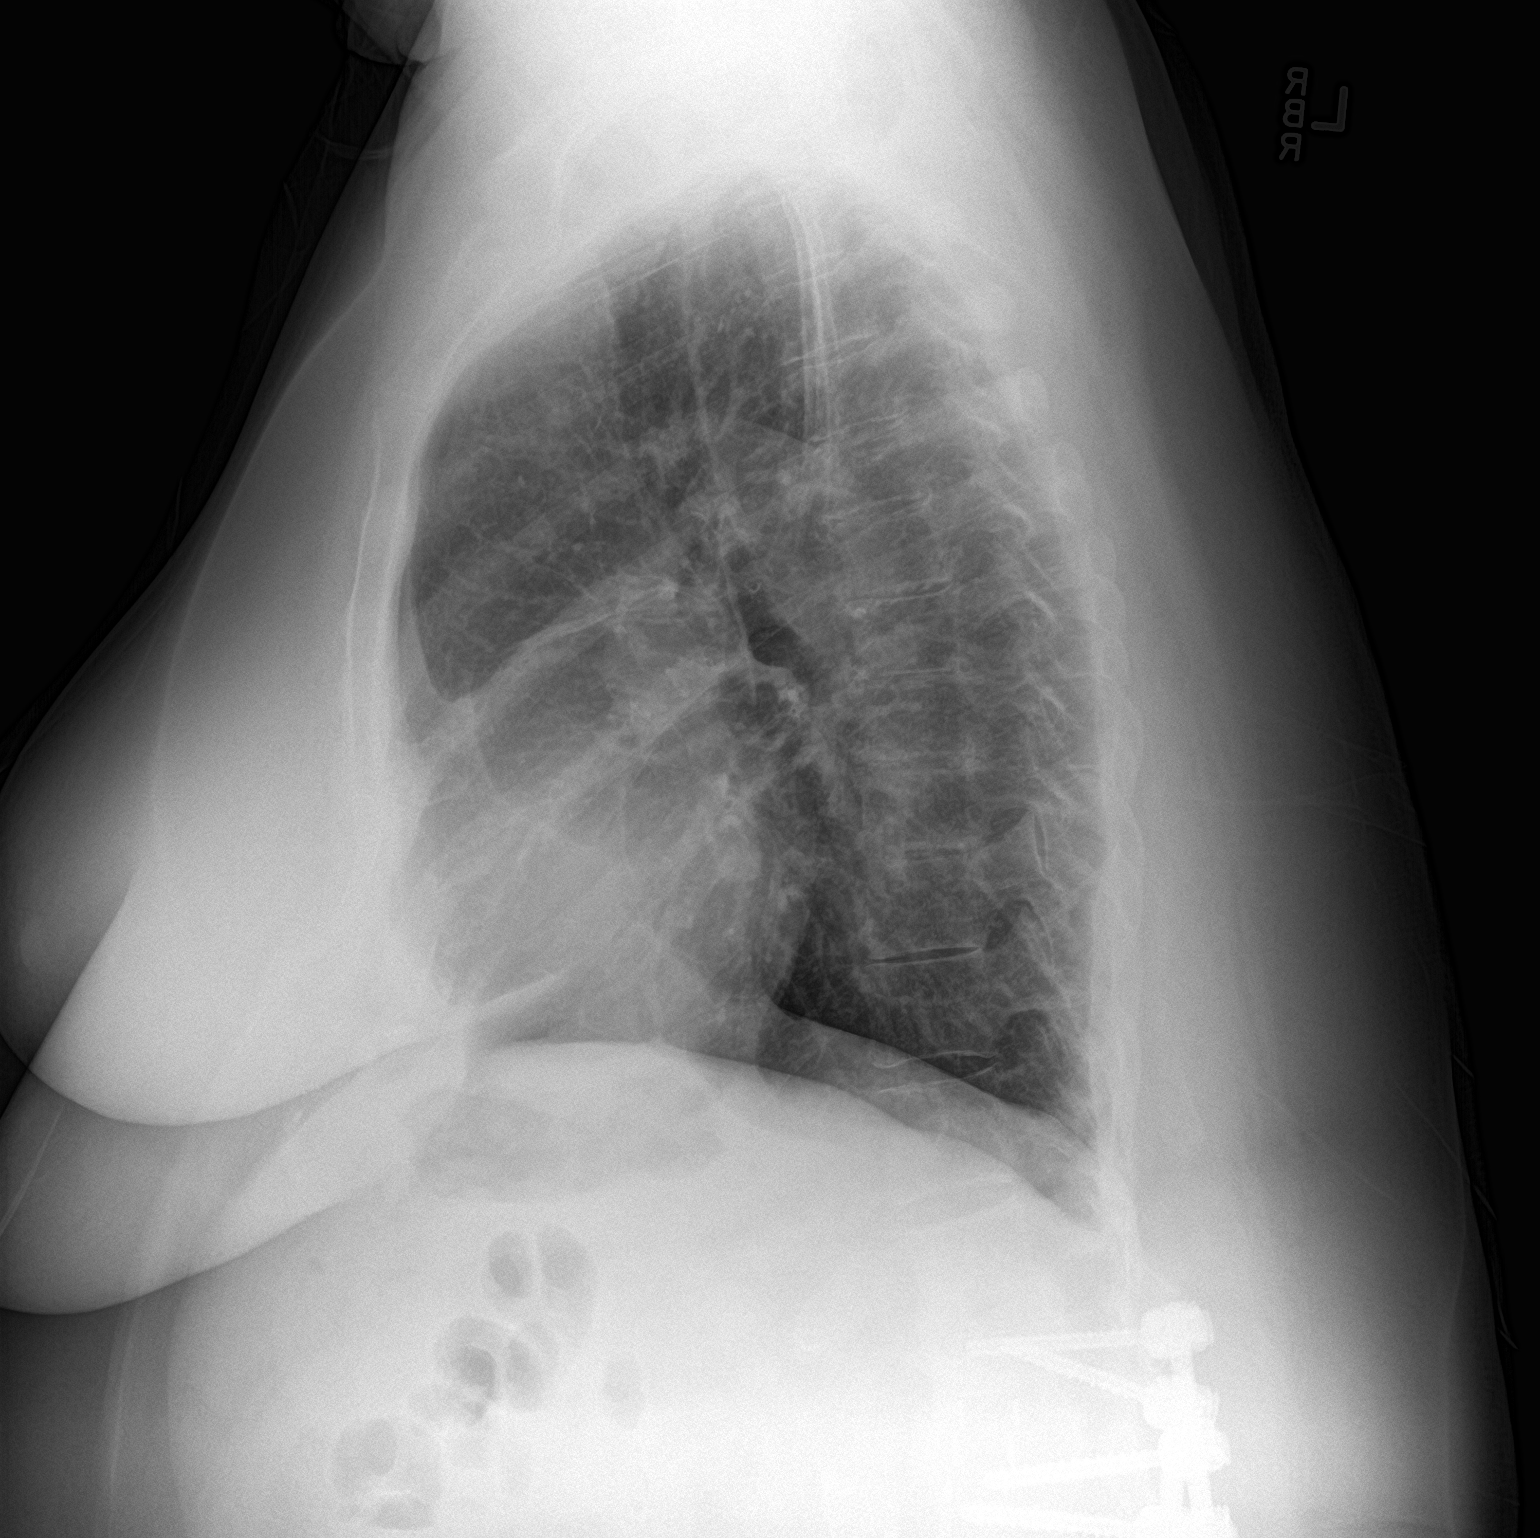

[2 of 2 positions shown; findings below may reference images not displayed]

FINDINGS: Cardiac silhouette and mediastinal contours are unchanged and within
normal limits with mild calcification within aortic arch. Mild
bilateral mid and lower lung interstitial thickening is unchanged.
No focal airspace opacity to indicate pneumonia. No pleural effusion
or pneumothorax. Moderate multilevel degenerative disc changes of
the thoracic spine. Partial visualization of lumbar spine posterior
fusion hardware.
IMPRESSION: No active cardiopulmonary disease.

## 2021-05-10 MED ORDER — ALBUTEROL SULFATE HFA 108 (90 BASE) MCG/ACT IN AERS: 2.0000 | INHALATION_SPRAY | Freq: Four times a day (QID) | RESPIRATORY_TRACT | 5 refills | Status: AC | PRN

## 2021-05-10 MED ORDER — CITALOPRAM HYDROBROMIDE 40 MG PO TABS: 40.0000 mg | ORAL_TABLET | Freq: Every day | ORAL | 3 refills | Status: DC

## 2021-05-10 MED ORDER — TIZANIDINE HCL 4 MG PO CAPS: 4.0000 mg | ORAL_CAPSULE | Freq: Three times a day (TID) | ORAL | 2 refills | Status: DC | PRN

## 2021-05-10 NOTE — Telephone Encounter (Addendum)
EVENITY VOB initiated via [email or web address] ? ?New start ? ?

## 2021-05-10 NOTE — Patient Instructions (Signed)

## 2021-05-10 NOTE — Assessment & Plan Note (Signed)
Encourage heart healthy diet such as MIND or DASH diet, increase exercise, avoid trans fats, simple carbohydrates and processed foods, consider a krill or fish or flaxseed oil cap daily.  °

## 2021-05-10 NOTE — Progress Notes (Signed)
? ?Subjective:  ? ?By signing my name below, I, [name], attest that this documentation has been prepared under the direction and in the presence of Ann Held, DO. 05/10/2021 ?   ? ? Patient ID: Jasmine Mclaughlin, female    DOB: 1950-07-24, 71 y.o.   MRN: [id number] ? ?Chief Complaint  ?Patient presents with  ? Hypertension  ? Hyperlipidemia  ? Follow-up  ?  Pt would like to discuss increasing Celexa  ? ? ?Hypertension ?Associated symptoms include shortness of breath (with exertion). Pertinent negatives include no blurred vision, chest pain, headaches, malaise/fatigue or palpitations.  ?Hyperlipidemia ?Associated symptoms include shortness of breath (with exertion). Pertinent negatives include no chest pain.  ?Patient is in today for a follow up visit.  ? ?She is requesting to increase her 20 mg Celexa dosage.  ?She continues having pain from her arthritis. She is taking arthritis strength tylenol and muscle relaxer to manage her pain. Her pain makes it difficult to sleep at night. ?She finds in the past couple of months her nervousness and shakiness has worsened. She also feels more easily excitable. She has started seeing a neuropsychiatrist last week on 05/01/2021. She was reccommended an increase does for Celexa and start seeing a therapist. She also found she does not have dementia nor alzheimer's disease. ?She reports her breathing feels more labored. The past 2 days have been more windy than normal and she finds her breathe feels like its leaving her when she is outside. She denies coughing up anything. She continues using Breztri inhaler 2x daily. She is not using Flonase nasal spray regularly. She is not taking allergy medication regularly at this time. She is willing to see a pulmonologist if her symptoms worsen or do not improve.  ?She continues seeing her cardiologist regularly. Her breathing was normal during her last visit with her cardiologist in February 2023.  ?She was found to have  osteoporosis during her last bone density scan on 04/19/2021. She has continues taking fosamax regularly and reports no new issues while taking it. ? ? ?Past Medical History:  ?Diagnosis Date  ? Allergic rhinitis 01/24/2015  ? Asthma   ? Atypical chest pain 08/25/2019  ? Chest tightness 10/27/2013  ? COPD (chronic obstructive pulmonary disease)   ? Degenerative disc disease, lumbar 10/14/2012  ? Generalized anxiety disorder 11/13/2012  ? Genital warts   ? History of COVID-19 08/2020  ? Hyperlipidemia 08/08/2020  ? Major depressive disorder   ? Obesity (BMI 30-39.9) 10/14/2012  ? Osteoarthritis 09/28/2010  ? Osteoporosis   ? Palpitations 11/28/2020  ? Pneumonia   ? Primary hypertension 12/23/2019  ? Shortness of breath   ? Sinusitis, acute 10/27/2013  ? Tobacco abuse 09/28/2010  ? Tubular adenoma of Jasmine 11/18/2017  ? Vitamin D deficiency 12/23/2019  ? Wheezing 10/27/2013  ? ? ?Past Surgical History:  ?Procedure Laterality Date  ? ANTERIOR FUSION LUMBAR SPINE  05/06/2012  ? ANTERIOR LAT LUMBAR FUSION N/A 05/06/2012  ? Procedure: ANTERIOR LATERAL LUMBAR FUSION 3 LEVELS;  Surgeon: Sinclair Ship, MD;  Location: [place];  Service: Orthopedics;  Laterality: N/A;  Lateral interbody fusion, lumbar 2-3,lumbar 3-4, lumbar 4-5 with instrumentation and allograft.  ? COLONOSCOPY    ? COLONOSCOPY WITH PROPOFOL N/A 12/15/2017  ? Procedure: COLONOSCOPY WITH PROPOFOL;  Surgeon: Mansouraty, Telford Nab., MD;  Location: [place];  Service: Gastroenterology;  Laterality: N/A;  ? COLONOSCOPY WITH PROPOFOL N/A 07/08/2018  ? Procedure: COLONOSCOPY WITH PROPOFOL;  Surgeon: Irving Copas.,  MD;  Location: [place];  Service: Gastroenterology;  Laterality: N/A;  ? COLONOSCOPY WITH PROPOFOL N/A 09/01/2019  ? Procedure: COLONOSCOPY WITH PROPOFOL;  Surgeon: Mansouraty, Telford Nab., MD;  Location: [facility];  Service: Gastroenterology;  Laterality: N/A;  ? KNEE ARTHROSCOPY Right   ?  R knee 01/2012, for ligament tears  ?  POLYPECTOMY  07/08/2018  ? Procedure: POLYPECTOMY;  Surgeon: Irving Copas., MD;  Location: [place];  Service: Gastroenterology;;  ? POLYPECTOMY  09/01/2019  ? Procedure: POLYPECTOMY;  Surgeon: Mansouraty, Telford Nab., MD;  Location: [facility];  Service: Gastroenterology;;  ? TONSILLECTOMY    ? VAGINAL DELIVERY    ? x1  ? ? ?Family History  ?Problem Relation Age of Onset  ? Arthritis Mother   ? Arthritis Father   ? Stroke Father   ? Jasmine cancer Father 3  ? Heart disease Father 57  ?     chf  ? Breast cancer Maternal Grandmother   ? Asthma Maternal Grandmother   ? Asthma Daughter   ? Esophageal cancer Neg Hx   ? Ulcerative colitis Neg Hx   ? Stomach cancer Neg Hx   ? ? ?Social History  ? ?Socioeconomic History  ? Marital status: Single  ?  Spouse name: Not on file  ? Number of children: 1  ? Years of education: 55  ? Highest education level: High school graduate  ?Occupational History  ? 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 Smoking status: Every Day  ?  Packs/day: 0.50  ?  Years: 55.00  ?  Pack years: 27.50  ?  Types: Cigarettes  ? Smokeless tobacco: Never  ?Vaping Use  ? Vaping Use: Never used  ?Substance and Sexual Activity  ? Alcohol use: Yes  ?  Alcohol/week: 7.0 - 14.0 standard drinks  ?  Types: 7 - 14 Standard drinks or equivalent per week  ?  Comment: at least 1 drink daily, but described alcohol use as "a habit" and that it "gets out of hand" at times  ? Drug use: No  ? Sexual activity: Not Currently  ?  Partners: Male  ?Other Topics Concern  ? Not on file  ?Social History Narrative  ? 1 caffeine drinks daily  ? No exercise  ? Left handed  ? One story home  ? ?Social Determinants of Health  ? ?Financial Resource Strain: Low Risk   ? Difficulty of Paying Living Expenses: Not hard at all  ?Food Insecurity: No Food Insecurity  ? Worried About Charity fundraiser in the Last Year: Never true  ? Ran Out of Food in the Last Year: Never true  ?Transportation Needs: No  Transportation Needs  ? Lack of Transportation (Medical): No  ? Lack of Transportation (Non-Medical): No  ?Physical Activity: Insufficiently Active  ? Days of Exercise per Week: 7 days  ? Minutes of Exercise per Session: 10 min  ?Stress: No Stress Concern Present  ? Feeling of Stress : Not at all  ?Social Connections: Not on file  ?Intimate Partner Violence: Not At Risk  ? Fear of Current or Ex-Partner: No  ? Emotionally Abused: No  ? Physically Abused: No  ? Sexually Abused: No  ? ? ?Outpatient Medications Prior to Visit  ?Medication Sig Dispense Refill  ? acetaminophen (TYLENOL) 650 MG CR tablet Take 650 mg by mouth 2 times daily at 12 noon and 4 pm. Patient takes 2 tablets twice a day    ? alendronate (  FOSAMAX) 70 MG tablet TAKE 1 TABLET (70 MG) BY MOUTH ONCE A WEEK. TAKE WITH A FULL GLASS OF WATER ON AN EMPTY STOMACH. 12 tablet 3  ? aspirin EC 81 MG tablet Take 81 mg by mouth daily. Swallow whole.    ? Budeson-Glycopyrrol-Formoterol (BREZTRI AEROSPHERE) 160-9-4.8 MCG/ACT AERO Inhale 2 puffs into the lungs 2 (two) times daily. 10.7 g 11  ? calcium-vitamin D (OSCAL WITH D) 500-5 MG-MCG tablet Take 1 tablet by mouth.    ? chlorpheniramine (CHLOR-TRIMETON) 4 MG tablet Take 4 mg by mouth daily as needed for allergies.    ? co-enzyme Q-10 30 MG capsule Take 30 mg by mouth 3 (three) times daily.    ? diltiazem (CARDIZEM CD) 180 MG 24 hr capsule Take 1 capsule (180 mg total) by mouth daily. 90 capsule 3  ? diphenhydrAMINE (SOMINEX) 25 MG tablet Take 25 mg by mouth at bedtime.    ? fluticasone (FLONASE) 50 MCG/ACT nasal spray Place 2 sprays into both nostrils daily. 16 g 6  ? gabapentin (NEURONTIN) 300 MG capsule Take 1 capsule (300 mg total) by mouth at bedtime. 90 capsule 1  ? simvastatin (ZOCOR) 20 MG tablet TAKE 1 TABLET (20 MG TOTAL) BY MOUTH AT BEDTIME. 90 tablet 1  ? vitamin B-12 (CYANOCOBALAMIN) 1000 MCG tablet Take 1,000 mcg by mouth daily.    ? albuterol (VENTOLIN HFA) 108 (90 Base) MCG/ACT inhaler Inhale 2  puffs into the lungs every 6 (six) hours as needed for wheezing or shortness of breath. 1 each 5  ? citalopram (CELEXA) 20 MG tablet TAKE 1 TABLET BY MOUTH EVERY DAY 90 tablet 1  ? tiZANidine (ZANAFLEX) 4 MG capsule Take 1

## 2021-05-10 NOTE — Telephone Encounter (Signed)
-----   Message from Ann Held, DO sent at 05/10/2021 10:38 AM EDT ----- ?Pt is willing to start evenity if covered  ? ?

## 2021-05-10 NOTE — Assessment & Plan Note (Signed)
Inc celexa to 40 mg daily ?rto 6 months ?

## 2021-05-10 NOTE — Assessment & Plan Note (Signed)
Pt will ing to switch to evenity if covered  ?See bmd ?

## 2021-05-10 NOTE — Telephone Encounter (Signed)
See message below °

## 2021-05-10 NOTE — Assessment & Plan Note (Signed)
Well controlled, no changes to meds. Encouraged heart healthy diet such as the DASH diet and exercise as tolerated.  °

## 2021-05-11 ENCOUNTER — Ambulatory Visit: Payer: Medicare HMO | Admitting: Family Medicine

## 2021-05-13 DIAGNOSIS — Z79899 Other long term (current) drug therapy: Secondary | ICD-10-CM | POA: Diagnosis not present

## 2021-05-13 DIAGNOSIS — Z20822 Contact with and (suspected) exposure to covid-19: Secondary | ICD-10-CM | POA: Diagnosis not present

## 2021-05-13 DIAGNOSIS — I471 Supraventricular tachycardia: Secondary | ICD-10-CM | POA: Diagnosis not present

## 2021-05-13 DIAGNOSIS — R0602 Shortness of breath: Secondary | ICD-10-CM | POA: Diagnosis not present

## 2021-05-13 DIAGNOSIS — R69 Illness, unspecified: Secondary | ICD-10-CM | POA: Diagnosis not present

## 2021-05-13 DIAGNOSIS — J189 Pneumonia, unspecified organism: Secondary | ICD-10-CM | POA: Diagnosis not present

## 2021-05-13 DIAGNOSIS — R079 Chest pain, unspecified: Secondary | ICD-10-CM | POA: Diagnosis not present

## 2021-05-13 DIAGNOSIS — J441 Chronic obstructive pulmonary disease with (acute) exacerbation: Secondary | ICD-10-CM | POA: Diagnosis not present

## 2021-05-13 DIAGNOSIS — I10 Essential (primary) hypertension: Secondary | ICD-10-CM | POA: Diagnosis not present

## 2021-05-13 DIAGNOSIS — R918 Other nonspecific abnormal finding of lung field: Secondary | ICD-10-CM | POA: Diagnosis not present

## 2021-05-13 DIAGNOSIS — E785 Hyperlipidemia, unspecified: Secondary | ICD-10-CM | POA: Diagnosis not present

## 2021-05-14 DIAGNOSIS — Z79899 Other long term (current) drug therapy: Secondary | ICD-10-CM | POA: Diagnosis not present

## 2021-05-14 DIAGNOSIS — I10 Essential (primary) hypertension: Secondary | ICD-10-CM | POA: Diagnosis not present

## 2021-05-14 DIAGNOSIS — E785 Hyperlipidemia, unspecified: Secondary | ICD-10-CM | POA: Diagnosis not present

## 2021-05-14 DIAGNOSIS — I471 Supraventricular tachycardia: Secondary | ICD-10-CM | POA: Diagnosis not present

## 2021-05-14 DIAGNOSIS — M810 Age-related osteoporosis without current pathological fracture: Secondary | ICD-10-CM | POA: Diagnosis not present

## 2021-05-14 DIAGNOSIS — R0602 Shortness of breath: Secondary | ICD-10-CM | POA: Diagnosis not present

## 2021-05-14 DIAGNOSIS — Z66 Do not resuscitate: Secondary | ICD-10-CM | POA: Diagnosis not present

## 2021-05-14 DIAGNOSIS — I2510 Atherosclerotic heart disease of native coronary artery without angina pectoris: Secondary | ICD-10-CM | POA: Diagnosis not present

## 2021-05-14 DIAGNOSIS — R1310 Dysphagia, unspecified: Secondary | ICD-10-CM | POA: Diagnosis not present

## 2021-05-14 DIAGNOSIS — Z7952 Long term (current) use of systemic steroids: Secondary | ICD-10-CM | POA: Diagnosis not present

## 2021-05-14 DIAGNOSIS — J189 Pneumonia, unspecified organism: Secondary | ICD-10-CM | POA: Diagnosis not present

## 2021-05-14 DIAGNOSIS — Z20822 Contact with and (suspected) exposure to covid-19: Secondary | ICD-10-CM | POA: Diagnosis not present

## 2021-05-14 DIAGNOSIS — R69 Illness, unspecified: Secondary | ICD-10-CM | POA: Diagnosis not present

## 2021-05-14 DIAGNOSIS — E7800 Pure hypercholesterolemia, unspecified: Secondary | ICD-10-CM | POA: Diagnosis not present

## 2021-05-14 DIAGNOSIS — J441 Chronic obstructive pulmonary disease with (acute) exacerbation: Secondary | ICD-10-CM | POA: Diagnosis not present

## 2021-05-14 DIAGNOSIS — F17210 Nicotine dependence, cigarettes, uncomplicated: Secondary | ICD-10-CM | POA: Diagnosis not present

## 2021-05-14 DIAGNOSIS — Z7982 Long term (current) use of aspirin: Secondary | ICD-10-CM | POA: Diagnosis not present

## 2021-05-15 ENCOUNTER — Encounter: Payer: Medicare HMO | Admitting: Psychology

## 2021-05-16 ENCOUNTER — Encounter: Payer: Self-pay | Admitting: Family Medicine

## 2021-05-16 DIAGNOSIS — J189 Pneumonia, unspecified organism: Secondary | ICD-10-CM | POA: Diagnosis not present

## 2021-05-16 DIAGNOSIS — J441 Chronic obstructive pulmonary disease with (acute) exacerbation: Secondary | ICD-10-CM | POA: Diagnosis not present

## 2021-05-16 NOTE — Telephone Encounter (Signed)
Meds not on med list. Okay to send?  ?

## 2021-05-17 MED ORDER — ALBUTEROL SULFATE (2.5 MG/3ML) 0.083% IN NEBU: 2.5000 mg | INHALATION_SOLUTION | RESPIRATORY_TRACT | 1 refills | Status: AC | PRN

## 2021-05-17 MED ORDER — IPRATROPIUM BROMIDE 0.02 % IN SOLN: 0.5000 mg | Freq: Four times a day (QID) | RESPIRATORY_TRACT | 12 refills | Status: AC

## 2021-05-30 NOTE — Telephone Encounter (Signed)
Prior auth required for EVENITY  : PA is required. Please call [phone], or fax [phone].

## 2021-06-01 ENCOUNTER — Other Ambulatory Visit: Payer: Self-pay | Admitting: Family Medicine

## 2021-06-01 DIAGNOSIS — F418 Other specified anxiety disorders: Secondary | ICD-10-CM

## 2021-06-08 NOTE — Telephone Encounter (Signed)
Prior Authorization initiated for [facility] via [employer] Case ID: [id number]

## 2021-06-08 NOTE — Telephone Encounter (Signed)
Insurance called and states that Jasmine Mclaughlin is a step therapy. Pt must have tried Forteo in the past. After looking I did not see this on med list. She asks if you would be okay with putting the pt on this Rx? Or do you want to keep her on Fosamax?

## 2021-06-11 NOTE — Telephone Encounter (Signed)
Pt archived in [email or web address].  Please advise if patient and/or provider wish to proceed with EVENITY therpay.  

## 2021-06-12 ENCOUNTER — Other Ambulatory Visit: Payer: Self-pay

## 2021-06-12 DIAGNOSIS — M810 Age-related osteoporosis without current pathological fracture: Secondary | ICD-10-CM

## 2021-06-12 MED ORDER — FORTEO 600 MCG/2.4ML ~~LOC~~ SOPN: 20.0000 ug | PEN_INJECTOR | Freq: Every day | SUBCUTANEOUS | 0 refills | Status: DC

## 2021-06-12 NOTE — Telephone Encounter (Signed)
Pt okay with trying this injection daily- NV scheduled for teaching.

## 2021-06-19 ENCOUNTER — Telehealth: Payer: Self-pay

## 2021-06-19 ENCOUNTER — Ambulatory Visit (INDEPENDENT_AMBULATORY_CARE_PROVIDER_SITE_OTHER): Payer: Medicare HMO

## 2021-06-19 ENCOUNTER — Encounter: Payer: Self-pay | Admitting: Family Medicine

## 2021-06-19 DIAGNOSIS — M810 Age-related osteoporosis without current pathological fracture: Secondary | ICD-10-CM

## 2021-06-19 NOTE — Progress Notes (Deleted)
I have reviewed Forteo in detail with the patient today. All questions have been answered to her satisfaction. We have reviewed proper technique of injection, storing medication. The patient was able to demonstrate adequate skill with doing self injections.  Pt was supposed to have her 1st dose administered in office today in the office. She says the pharmacy has filled this Rx.     [place], Darlis Loan 06/19/2021 10:53 AM

## 2021-06-19 NOTE — Telephone Encounter (Signed)
Initiated:  Lennox Grumbles KeyJeannetta Ellis - PA Case ID: [id number]

## 2021-06-19 NOTE — Progress Notes (Deleted)
I have reviewed Forteo in detail with the patient today. All questions have been answered to her satisfaction. We have reviewed proper technique of injection, storing medication. The patient was able to demonstrate adequate skill with doing self injections.  1st dose administered in office today in the *** after teaching- pt supplied.     [place], Darlis Loan 06/19/2021 8:48 AM

## 2021-06-20 NOTE — Telephone Encounter (Signed)
This approval authorizes your coverage from 01/06/2021 - 06/09/2023, unless we notify you otherwise, and as long as the following conditions apply:

## 2021-06-20 NOTE — Progress Notes (Cosign Needed)
I have reviewed Forteo in detail with the patient today. All questions have been answered to her satisfaction. We have reviewed proper technique of injection, storing medication. Unfortunately the patient was not able to demonstrate adequate skill with doing self injections since she has not been able to obtain her Rx yet.       [place], Darlis Loan 06/19/2021 10:53 AM  Edit: Alvy Bimler L 06/20/21 2:00 PM

## 2021-06-26 DIAGNOSIS — M533 Sacrococcygeal disorders, not elsewhere classified: Secondary | ICD-10-CM | POA: Diagnosis not present

## 2021-07-15 NOTE — Telephone Encounter (Signed)
Pt archived in [email or web address].  Please advise if patient and/or provider wish to proceed with EVENITY therpay.

## 2021-07-23 ENCOUNTER — Encounter: Payer: Self-pay | Admitting: Neurology

## 2021-07-24 ENCOUNTER — Encounter: Payer: Self-pay | Admitting: Family Medicine

## 2021-07-24 DIAGNOSIS — J449 Chronic obstructive pulmonary disease, unspecified: Secondary | ICD-10-CM

## 2021-07-24 MED ORDER — BREZTRI AEROSPHERE 160-9-4.8 MCG/ACT IN AERO: 2.0000 | INHALATION_SPRAY | Freq: Two times a day (BID) | RESPIRATORY_TRACT | 5 refills | Status: DC

## 2021-08-06 ENCOUNTER — Other Ambulatory Visit: Payer: Self-pay | Admitting: Neurology

## 2021-08-09 ENCOUNTER — Encounter: Payer: Self-pay | Admitting: Family Medicine

## 2021-08-09 DIAGNOSIS — M62838 Other muscle spasm: Secondary | ICD-10-CM

## 2021-08-09 DIAGNOSIS — E785 Hyperlipidemia, unspecified: Secondary | ICD-10-CM

## 2021-08-09 MED ORDER — SIMVASTATIN 20 MG PO TABS: 20.0000 mg | ORAL_TABLET | Freq: Every day | ORAL | 1 refills | Status: DC

## 2021-08-09 MED ORDER — TIZANIDINE HCL 4 MG PO CAPS: 4.0000 mg | ORAL_CAPSULE | Freq: Three times a day (TID) | ORAL | 2 refills | Status: DC | PRN

## 2021-08-30 ENCOUNTER — Telehealth (INDEPENDENT_AMBULATORY_CARE_PROVIDER_SITE_OTHER): Payer: Medicare HMO | Admitting: Family Medicine

## 2021-08-30 ENCOUNTER — Encounter: Payer: Self-pay | Admitting: Family Medicine

## 2021-08-30 DIAGNOSIS — J440 Chronic obstructive pulmonary disease with acute lower respiratory infection: Secondary | ICD-10-CM

## 2021-08-30 DIAGNOSIS — J0140 Acute pansinusitis, unspecified: Secondary | ICD-10-CM

## 2021-08-30 DIAGNOSIS — J209 Acute bronchitis, unspecified: Secondary | ICD-10-CM

## 2021-08-30 MED ORDER — PREDNISONE 10 MG PO TABS: ORAL_TABLET | ORAL | 0 refills | Status: DC

## 2021-08-30 MED ORDER — AMOXICILLIN-POT CLAVULANATE 875-125 MG PO TABS: 1.0000 | ORAL_TABLET | Freq: Two times a day (BID) | ORAL | 0 refills | Status: DC

## 2021-08-30 MED ORDER — PROMETHAZINE-DM 6.25-15 MG/5ML PO SYRP: 5.0000 mL | ORAL_SOLUTION | Freq: Four times a day (QID) | ORAL | 0 refills | Status: DC | PRN

## 2021-08-30 NOTE — Assessment & Plan Note (Signed)
abx per orders  flonase  antihistamine

## 2021-08-30 NOTE — Progress Notes (Signed)
MyChart Video Visit    Virtual Visit via Video Note   This visit type was conducted due to national recommendations for restrictions regarding the COVID-19 Pandemic (e.g. social distancing) in an effort to limit this patient's exposure and mitigate transmission in our community. This patient is at least at moderate risk for complications without adequate follow up. This format is felt to be most appropriate for this patient at this time. Physical exam was limited by quality of the video and audio technology used for the visit. Heathr  was able to get the patient set up on a video visit.  Patient location: Home with daughter  Patient and provider in visit Provider location: Office  I discussed the limitations of evaluation and management by telemedicine and the availability of in person appointments. The patient expressed understanding and agreed to proceed.  Visit Date: 08/30/2021  Today's healthcare provider: Ann Held, DO     Subjective:    Patient ID: Jasmine Mclaughlin, female    DOB: 05-19-1950, 71 y.o.   MRN: [id number]  Chief Complaint  Patient presents with   Generalized Body Aches    Pt was out of town in [place] last week and woke unable to breathe, body aches, small cough, some fever. COVID negative on 08/23/2021    HPI Patient is in today for congestion , trouble breathing , bodyaches  , + cough  + sinus pressure ,  + fever --- felt hot  Covid test neg on 8/17     [t not using any otc meds   Past Medical History:  Diagnosis Date   Allergic rhinitis 01/24/2015   Asthma    Atypical chest pain 08/25/2019   Chest tightness 10/27/2013   COPD (chronic obstructive pulmonary disease)    Degenerative disc disease, lumbar 10/14/2012   Generalized anxiety disorder 11/13/2012   Genital warts    History of COVID-19 08/2020   Hyperlipidemia 08/08/2020   Major depressive disorder    Obesity (BMI 30-39.9) 10/14/2012   Osteoarthritis 09/28/2010   Osteoporosis     Palpitations 11/28/2020   Pneumonia    Primary hypertension 12/23/2019   Shortness of breath    Sinusitis, acute 10/27/2013   Tobacco abuse 09/28/2010   Tubular adenoma of Jasmine 11/18/2017   Vitamin D deficiency 12/23/2019   Wheezing 10/27/2013    Past Surgical History:  Procedure Laterality Date   ANTERIOR FUSION LUMBAR SPINE  05/06/2012   ANTERIOR LAT LUMBAR FUSION N/A 05/06/2012   Procedure: ANTERIOR LATERAL LUMBAR FUSION 3 LEVELS;  Surgeon: Sinclair Ship, MD;  Location: [place];  Service: Orthopedics;  Laterality: N/A;  Lateral interbody fusion, lumbar 2-3,lumbar 3-4, lumbar 4-5 with instrumentation and allograft.   COLONOSCOPY     COLONOSCOPY WITH PROPOFOL N/A 12/15/2017   Procedure: COLONOSCOPY WITH PROPOFOL;  Surgeon: Rush Landmark Telford Nab., MD;  Location: [place];  Service: Gastroenterology;  Laterality: N/A;   COLONOSCOPY WITH PROPOFOL N/A 07/08/2018   Procedure: COLONOSCOPY WITH PROPOFOL;  Surgeon: Rush Landmark Telford Nab., MD;  Location: [facility];  Service: Gastroenterology;  Laterality: N/A;   COLONOSCOPY WITH PROPOFOL N/A 09/01/2019   Procedure: COLONOSCOPY WITH PROPOFOL;  Surgeon: Rush Landmark Telford Nab., MD;  Location: [facility];  Service: Gastroenterology;  Laterality: N/A;   KNEE ARTHROSCOPY Right     R knee 01/2012, for ligament tears   POLYPECTOMY  07/08/2018   Procedure: POLYPECTOMY;  Surgeon: Mansouraty, Telford Nab., MD;  Location: [place];  Service: Gastroenterology;;   POLYPECTOMY  09/01/2019  Procedure: POLYPECTOMY;  Surgeon: Mansouraty, Telford Nab., MD;  Location: [facility];  Service: Gastroenterology;;   TONSILLECTOMY     VAGINAL DELIVERY     x1    Family History  Problem Relation Age of Onset   Arthritis Mother    Arthritis Father    Stroke Father    Jasmine cancer Father 61   Heart disease Father 61       chf   Breast cancer Maternal Grandmother    Asthma Maternal Grandmother    Asthma Daughter    Esophageal cancer Neg Hx     Ulcerative colitis Neg Hx    Stomach cancer Neg Hx     Social History   Socioeconomic History   Marital status: Single    Spouse name: Not on file   Number of children: 1   Years of education: 12   Highest education level: High school graduate  Occupational History   Occupation: Retired    Comment: Press photographer  Tobacco Use   Smoking status: Every Day    Packs/day: 0.50    Years: 55.00    Total pack years: 27.50    Types: Cigarettes   Smokeless tobacco: Never  Vaping Use   Vaping Use: Never used  Substance and Sexual Activity   Alcohol use: Yes    Alcohol/week: 7.0 - 14.0 standard drinks of alcohol    Types: 7 - 14 Standard drinks or equivalent per week    Comment: at least 1 drink daily, but described alcohol use as "a habit" and that it "gets out of hand" at times   Drug use: No   Sexual activity: Not Currently    Partners: Male  Other Topics Concern   Not on file  Social History Narrative   1 caffeine drinks daily   No exercise   Left handed   One story home   Social Determinants of Health   Financial Resource Strain: Low Risk  (11/09/2020)   Overall Financial Resource Strain (CARDIA)    Difficulty of Paying Living Expenses: Not hard at all  Food Insecurity: No Food Insecurity (11/09/2020)   Hunger Vital Sign    Worried About Running Out of Food in the Last Year: Never true    Ran Out of Food in the Last Year: Never true  Transportation Needs: No Transportation Needs (11/09/2020)   PRAPARE - Hydrologist (Medical): No    Lack of Transportation (Non-Medical): No  Physical Activity: Insufficiently Active (11/09/2020)   Exercise Vital Sign    Days of Exercise per Week: 7 days    Minutes of Exercise per Session: 10 min  Stress: No Stress Concern Present (11/09/2020)   [place]    Feeling of Stress : Not at all  Social Connections: Not on file  Intimate Partner Violence:  Not At Risk (11/09/2020)   Humiliation, Afraid, Rape, and Kick questionnaire    Fear of Current or Ex-Partner: No    Emotionally Abused: No    Physically Abused: No    Sexually Abused: No    Outpatient Medications Prior to Visit  Medication Sig Dispense Refill   acetaminophen (TYLENOL) 650 MG CR tablet Take 650 mg by mouth 2 times daily at 12 noon and 4 pm. Patient takes 2 tablets twice a day     albuterol (PROVENTIL) (2.5 MG/3ML) 0.083% nebulizer solution Take 3 mLs (2.5 mg total) by nebulization every 4 (four) hours as needed for wheezing  or shortness of breath. 150 mL 1   albuterol (VENTOLIN HFA) 108 (90 Base) MCG/ACT inhaler Inhale 2 puffs into the lungs every 6 (six) hours as needed for wheezing or shortness of breath. 1 each 5   aspirin EC 81 MG tablet Take 81 mg by mouth daily. Swallow whole.     Budeson-Glycopyrrol-Formoterol (BREZTRI AEROSPHERE) 160-9-4.8 MCG/ACT AERO Inhale 2 puffs into the lungs 2 (two) times daily. 10.7 g 5   calcium-vitamin D (OSCAL WITH D) 500-5 MG-MCG tablet Take 1 tablet by mouth.     chlorpheniramine (CHLOR-TRIMETON) 4 MG tablet Take 4 mg by mouth daily as needed for allergies.     citalopram (CELEXA) 40 MG tablet TAKE 1 TABLET BY MOUTH EVERY DAY 90 tablet 1   co-enzyme Q-10 30 MG capsule Take 30 mg by mouth 3 (three) times daily.     diphenhydrAMINE (SOMINEX) 25 MG tablet Take 25 mg by mouth at bedtime.     fluticasone (FLONASE) 50 MCG/ACT nasal spray Place 2 sprays into both nostrils daily. 16 g 6   gabapentin (NEURONTIN) 300 MG capsule TAKE 1 CAPSULE BY MOUTH EVERYDAY AT BEDTIME 90 capsule 1   ipratropium (ATROVENT) 0.02 % nebulizer solution Take 2.5 mLs (0.5 mg total) by nebulization 4 (four) times daily. 75 mL 12   simvastatin (ZOCOR) 20 MG tablet Take 1 tablet (20 mg total) by mouth at bedtime. 90 tablet 1   tiZANidine (ZANAFLEX) 4 MG capsule Take 1 capsule (4 mg total) by mouth 3 (three) times daily as needed for muscle spasms. 30 capsule 2   vitamin  B-12 (CYANOCOBALAMIN) 1000 MCG tablet Take 1,000 mcg by mouth daily.     diltiazem (CARDIZEM CD) 180 MG 24 hr capsule Take 1 capsule (180 mg total) by mouth daily. 90 capsule 3   hydrochlorothiazide (MICROZIDE) 12.5 MG capsule Take 1 capsule (12.5 mg total) by mouth daily. 90 capsule 3   isosorbide mononitrate (IMDUR) 30 MG 24 hr tablet Take 0.5 tablets (15 mg total) by mouth daily. 45 tablet 3   nitroGLYCERIN (NITROSTAT) 0.4 MG SL tablet Place 1 tablet (0.4 mg total) under the tongue every 5 (five) minutes as needed for chest pain. 90 tablet 3   Teriparatide, Recombinant, (FORTEO) 600 MCG/2.4ML SOPN Inject 20 mcg into the skin daily. 2.4 mL 0   No facility-administered medications prior to visit.    No Known Allergies  ROS     Objective:    Physical Exam Vitals and nursing note reviewed.  Constitutional:      Appearance: She is well-developed.  HENT:     Head: Normocephalic and atraumatic.     Nose: Congestion present.     Right Sinus: Maxillary sinus tenderness and frontal sinus tenderness present.     Left Sinus: Maxillary sinus tenderness and frontal sinus tenderness present.  Eyes:     Conjunctiva/sclera: Conjunctivae normal.  Chest:     Chest wall: No tenderness.  Neurological:     Mental Status: She is alert and oriented to person, place, and time.     There were no vitals taken for this visit. Wt Readings from Last 3 Encounters:  05/10/21 227 lb (103 kg)  03/06/21 229 lb (103.9 kg)  01/12/21 226 lb 9.6 oz (102.8 kg)    Diabetic Foot Exam - Simple   No data filed    Lab Results  Component Value Date   WBC 12.5 (H) 05/10/2021   HGB 13.3 05/10/2021   HCT 40.4 05/10/2021   PLT 355.0  05/10/2021   GLUCOSE 95 05/10/2021   CHOL 142 05/10/2021   TRIG 171.0 (H) 05/10/2021   HDL 53.90 05/10/2021   LDLDIRECT 135.0 12/23/2019   LDLCALC 53 05/10/2021   ALT 14 05/10/2021   AST 14 05/10/2021   NA 136 05/10/2021   K 4.5 05/10/2021   CL 97 05/10/2021   CREATININE  0.91 05/10/2021   BUN 21 05/10/2021   CO2 29 05/10/2021   TSH 0.95 09/23/2019   INR 1.0 11/14/2017    Lab Results  Component Value Date   TSH 0.95 09/23/2019   Lab Results  Component Value Date   WBC 12.5 (H) 05/10/2021   HGB 13.3 05/10/2021   HCT 40.4 05/10/2021   MCV 102.1 (H) 05/10/2021   PLT 355.0 05/10/2021   Lab Results  Component Value Date   NA 136 05/10/2021   K 4.5 05/10/2021   CO2 29 05/10/2021   GLUCOSE 95 05/10/2021   BUN 21 05/10/2021   CREATININE 0.91 05/10/2021   BILITOT 0.5 05/10/2021   ALKPHOS 70 05/10/2021   AST 14 05/10/2021   ALT 14 05/10/2021   PROT 7.2 05/10/2021   ALBUMIN 4.5 05/10/2021   CALCIUM 9.9 05/10/2021   EGFR 77 03/13/2020   GFR 64.18 05/10/2021   Lab Results  Component Value Date   CHOL 142 05/10/2021   Lab Results  Component Value Date   HDL 53.90 05/10/2021   Lab Results  Component Value Date   LDLCALC 53 05/10/2021   Lab Results  Component Value Date   TRIG 171.0 (H) 05/10/2021   Lab Results  Component Value Date   CHOLHDL 3 05/10/2021   No results found for: "HGBA1C"     Assessment & Plan:   Problem List Items Addressed This Visit       Unprioritized   Acute non-recurrent pansinusitis - Primary    abx per orders  flonase  antihistamine      Relevant Medications   amoxicillin-clavulanate (AUGMENTIN) 875-125 MG tablet   predniSONE (DELTASONE) 10 MG tablet   promethazine-dextromethorphan (PROMETHAZINE-DM) 6.25-15 MG/5ML syrup   Acute bronchitis with COPD ([place])    abx per orders  Phenergan dm Recommend she do another covid test      Relevant Medications   amoxicillin-clavulanate (AUGMENTIN) 875-125 MG tablet   predniSONE (DELTASONE) 10 MG tablet   promethazine-dextromethorphan (PROMETHAZINE-DM) 6.25-15 MG/5ML syrup    I have discontinued Connye Burkitt. Gainer's Forteo. I am also having her start on amoxicillin-clavulanate, predniSONE, and promethazine-dextromethorphan. Additionally, I am having her  maintain her diphenhydrAMINE, chlorpheniramine, fluticasone, nitroGLYCERIN, cyanocobalamin, aspirin EC, acetaminophen, isosorbide mononitrate, hydrochlorothiazide, calcium-vitamin D, co-enzyme Q-10, diltiazem, albuterol, ipratropium, albuterol, citalopram, Breztri Aerosphere, gabapentin, simvastatin, and tiZANidine.  Meds ordered this encounter  Medications   amoxicillin-clavulanate (AUGMENTIN) 875-125 MG tablet    Sig: Take 1 tablet by mouth 2 (two) times daily.    Dispense:  20 tablet    Refill:  0   predniSONE (DELTASONE) 10 MG tablet    Sig: TAKE 3 TABLETS PO QD FOR 3 DAYS THEN TAKE 2 TABLETS PO QD FOR 3 DAYS THEN TAKE 1 TABLET PO QD FOR 3 DAYS THEN TAKE 1/2 TAB PO QD FOR 3 DAYS    Dispense:  20 tablet    Refill:  0   promethazine-dextromethorphan (PROMETHAZINE-DM) 6.25-15 MG/5ML syrup    Sig: Take 5 mLs by mouth 4 (four) times daily as needed.    Dispense:  118 mL    Refill:  0    I discussed the  assessment and treatment plan with the patient. The patient was provided an opportunity to ask questions and all were answered. The patient agreed with the plan and demonstrated an understanding of the instructions.   The patient was advised to call back or seek an in-person evaluation if the symptoms worsen or if the condition fails to improve as anticipated.     Ann Held, DO [facility] [phone] (phone) [phone] (fax)  [place]

## 2021-08-30 NOTE — Assessment & Plan Note (Signed)
abx per orders  Phenergan dm Recommend she do another covid test

## 2021-09-07 ENCOUNTER — Other Ambulatory Visit: Payer: Self-pay | Admitting: Family Medicine

## 2021-09-07 DIAGNOSIS — F419 Anxiety disorder, unspecified: Secondary | ICD-10-CM

## 2021-09-07 DIAGNOSIS — F418 Other specified anxiety disorders: Secondary | ICD-10-CM

## 2021-09-07 MED ORDER — CITALOPRAM HYDROBROMIDE 40 MG PO TABS: 40.0000 mg | ORAL_TABLET | Freq: Every day | ORAL | 1 refills | Status: DC

## 2021-09-20 ENCOUNTER — Other Ambulatory Visit: Payer: Self-pay

## 2021-09-20 ENCOUNTER — Telehealth: Payer: Self-pay | Admitting: Acute Care

## 2021-09-20 DIAGNOSIS — Z87891 Personal history of nicotine dependence: Secondary | ICD-10-CM

## 2021-09-20 DIAGNOSIS — F17210 Nicotine dependence, cigarettes, uncomplicated: Secondary | ICD-10-CM

## 2021-09-20 DIAGNOSIS — Z122 Encounter for screening for malignant neoplasm of respiratory organs: Secondary | ICD-10-CM

## 2021-09-20 NOTE — Telephone Encounter (Signed)
LDCT scheduled for 10/22/21

## 2021-09-25 ENCOUNTER — Ambulatory Visit (INDEPENDENT_AMBULATORY_CARE_PROVIDER_SITE_OTHER): Payer: Medicare HMO | Admitting: Family

## 2021-09-25 ENCOUNTER — Ambulatory Visit (HOSPITAL_BASED_OUTPATIENT_CLINIC_OR_DEPARTMENT_OTHER)
Admission: RE | Admit: 2021-09-25 | Discharge: 2021-09-25 | Disposition: A | Discharge status: Home / Self Care | Payer: Medicare HMO | Source: Ambulatory Visit | Attending: Family | Admitting: Family

## 2021-09-25 ENCOUNTER — Encounter: Payer: Self-pay | Admitting: Family

## 2021-09-25 VITALS — BP 130/60 | HR 72 | Temp 98.7°F | Ht 63.0 in | Wt 217.8 lb

## 2021-09-25 DIAGNOSIS — J441 Chronic obstructive pulmonary disease with (acute) exacerbation: Secondary | ICD-10-CM

## 2021-09-25 DIAGNOSIS — J45901 Unspecified asthma with (acute) exacerbation: Secondary | ICD-10-CM | POA: Diagnosis not present

## 2021-09-25 DIAGNOSIS — R059 Cough, unspecified: Secondary | ICD-10-CM | POA: Insufficient documentation

## 2021-09-25 DIAGNOSIS — R0602 Shortness of breath: Secondary | ICD-10-CM | POA: Diagnosis not present

## 2021-09-25 MED ORDER — DOXYCYCLINE HYCLATE 100 MG PO TABS: 100.0000 mg | ORAL_TABLET | Freq: Two times a day (BID) | ORAL | 0 refills | Status: DC

## 2021-09-25 NOTE — Progress Notes (Signed)
Jasmine Mclaughlin is a 71 y.o. female with the following history as recorded in EpicCare:  Patient Active Problem List   Diagnosis Date Noted   Acute non-recurrent pansinusitis 08/30/2021   Acute bronchitis with COPD ([place]) 08/30/2021   Depression with anxiety 05/10/2021   Major depressive disorder    Palpitations 11/28/2020   Hyperlipidemia 08/08/2020   History of COVID-19 08/2020   Primary hypertension 12/23/2019   Vitamin D deficiency 12/23/2019   Atypical chest pain 08/25/2019   Pneumonia    Osteoporosis    Genital warts    Tubular adenoma of colon 11/18/2017   Allergic rhinitis 01/24/2015   COPD (chronic obstructive pulmonary disease) 11/22/2013   Generalized anxiety disorder 11/13/2012   Degenerative disc disease, lumbar 10/14/2012   Obesity (BMI 30-39.9) 10/14/2012   Osteoarthritis 09/28/2010   Tobacco abuse 09/28/2010    Current Outpatient Medications  Medication Sig Dispense Refill   acetaminophen (TYLENOL) 650 MG CR tablet Take 650 mg by mouth 2 times daily at 12 noon and 4 pm. Patient takes 2 tablets twice a day     albuterol (PROVENTIL) (2.5 MG/3ML) 0.083% nebulizer solution Take 3 mLs (2.5 mg total) by nebulization every 4 (four) hours as needed for wheezing or shortness of breath. 150 mL 1   albuterol (VENTOLIN HFA) 108 (90 Base) MCG/ACT inhaler Inhale 2 puffs into the lungs every 6 (six) hours as needed for wheezing or shortness of breath. 1 each 5   aspirin EC 81 MG tablet Take 81 mg by mouth daily. Swallow whole.     Budeson-Glycopyrrol-Formoterol (BREZTRI AEROSPHERE) 160-9-4.8 MCG/ACT AERO Inhale 2 puffs into the lungs 2 (two) times daily. 10.7 g 5   calcium-vitamin D (OSCAL WITH D) 500-5 MG-MCG tablet Take 1 tablet by mouth.     chlorpheniramine (CHLOR-TRIMETON) 4 MG tablet Take 4 mg by mouth daily as needed for allergies.     citalopram (CELEXA) 40 MG tablet Take 1 tablet (40 mg total) by mouth daily. 90 tablet 1   co-enzyme Q-10 30 MG capsule Take 30 mg by mouth 3  (three) times daily.     diphenhydrAMINE (SOMINEX) 25 MG tablet Take 25 mg by mouth at bedtime.     doxycycline (VIBRA-TABS) 100 MG tablet Take 1 tablet (100 mg total) by mouth 2 (two) times daily. 14 tablet 0   fluticasone (FLONASE) 50 MCG/ACT nasal spray Place 2 sprays into both nostrils daily. 16 g 6   gabapentin (NEURONTIN) 300 MG capsule TAKE 1 CAPSULE BY MOUTH EVERYDAY AT BEDTIME 90 capsule 1   ipratropium (ATROVENT) 0.02 % nebulizer solution Take 2.5 mLs (0.5 mg total) by nebulization 4 (four) times daily. 75 mL 12   simvastatin (ZOCOR) 20 MG tablet Take 1 tablet (20 mg total) by mouth at bedtime. 90 tablet 1   tiZANidine (ZANAFLEX) 4 MG capsule Take 1 capsule (4 mg total) by mouth 3 (three) times daily as needed for muscle spasms. 30 capsule 2   vitamin B-12 (CYANOCOBALAMIN) 1000 MCG tablet Take 1,000 mcg by mouth daily.     diltiazem (CARDIZEM CD) 180 MG 24 hr capsule Take 1 capsule (180 mg total) by mouth daily. 90 capsule 3   hydrochlorothiazide (MICROZIDE) 12.5 MG capsule Take 1 capsule (12.5 mg total) by mouth daily. 90 capsule 3   isosorbide mononitrate (IMDUR) 30 MG 24 hr tablet Take 0.5 tablets (15 mg total) by mouth daily. 45 tablet 3   nitroGLYCERIN (NITROSTAT) 0.4 MG SL tablet Place 1 tablet (0.4 mg total) under the  tongue every 5 (five) minutes as needed for chest pain. 90 tablet 3   No current facility-administered medications for this visit.    Allergies: Patient has no known allergies.  Past Medical History:  Diagnosis Date   Allergic rhinitis 01/24/2015   Asthma    Atypical chest pain 08/25/2019   Chest tightness 10/27/2013   COPD (chronic obstructive pulmonary disease)    Degenerative disc disease, lumbar 10/14/2012   Generalized anxiety disorder 11/13/2012   Genital warts    History of COVID-19 08/2020   Hyperlipidemia 08/08/2020   Major depressive disorder    Obesity (BMI 30-39.9) 10/14/2012   Osteoarthritis 09/28/2010   Osteoporosis    Palpitations  11/28/2020   Pneumonia    Primary hypertension 12/23/2019   Shortness of breath    Sinusitis, acute 10/27/2013   Tobacco abuse 09/28/2010   Tubular adenoma of colon 11/18/2017   Vitamin D deficiency 12/23/2019   Wheezing 10/27/2013    Past Surgical History:  Procedure Laterality Date   ANTERIOR FUSION LUMBAR SPINE  05/06/2012   ANTERIOR LAT LUMBAR FUSION N/A 05/06/2012   Procedure: ANTERIOR LATERAL LUMBAR FUSION 3 LEVELS;  Surgeon: Sinclair Ship, MD;  Location: [place];  Service: Orthopedics;  Laterality: N/A;  Lateral interbody fusion, lumbar 2-3,lumbar 3-4, lumbar 4-5 with instrumentation and allograft.   COLONOSCOPY     COLONOSCOPY WITH PROPOFOL N/A 12/15/2017   Procedure: COLONOSCOPY WITH PROPOFOL;  Surgeon: Rush Landmark Telford Nab., MD;  Location: [facility];  Service: Gastroenterology;  Laterality: N/A;   COLONOSCOPY WITH PROPOFOL N/A 07/08/2018   Procedure: COLONOSCOPY WITH PROPOFOL;  Surgeon: Rush Landmark Telford Nab., MD;  Location: [facility];  Service: Gastroenterology;  Laterality: N/A;   COLONOSCOPY WITH PROPOFOL N/A 09/01/2019   Procedure: COLONOSCOPY WITH PROPOFOL;  Surgeon: Rush Landmark Telford Nab., MD;  Location: [facility];  Service: Gastroenterology;  Laterality: N/A;   KNEE ARTHROSCOPY Right     R knee 01/2012, for ligament tears   POLYPECTOMY  07/08/2018   Procedure: POLYPECTOMY;  Surgeon: Mansouraty, Telford Nab., MD;  Location: [facility];  Service: Gastroenterology;;   POLYPECTOMY  09/01/2019   Procedure: POLYPECTOMY;  Surgeon: Irving Copas., MD;  Location: [facility];  Service: Gastroenterology;;   TONSILLECTOMY     VAGINAL DELIVERY     x1    Family History  Problem Relation Age of Onset   Arthritis Mother    Arthritis Father    Stroke Father    Colon cancer Father 84   Heart disease Father 36       chf   Breast cancer Maternal Grandmother    Asthma Maternal Grandmother    Asthma Daughter    Esophageal cancer Neg Hx    Ulcerative colitis  Neg Hx    Stomach cancer Neg Hx     Social History   Tobacco Use   Smoking status: Every Day    Packs/day: 0.50    Years: 55.00    Total pack years: 27.50    Types: Cigarettes   Smokeless tobacco: Never  Substance Use Topics   Alcohol use: Yes    Alcohol/week: 7.0 - 14.0 standard drinks of alcohol    Types: 7 - 14 Standard drinks or equivalent per week    Comment: at least 1 drink daily, but described alcohol use as "a habit" and that it "gets out of hand" at times    Subjective:   Seen at end of August for virtual visit- was given Augmentin and Prednisone; did get better initially and then  symptoms re-flared after being off medication x 1 week; cough and congestion have returned; increased wheezing- is adamant that she will NOT take any more prednisone; has only used her nebulizer 3 x in the past week;     Objective:  Vitals:   09/25/21 1337  BP: 130/60  Pulse: 72  Temp: 98.7 F (37.1 C)  TempSrc: Oral  SpO2: 97%  Weight: 217 lb 12.8 oz (98.8 kg)  Height: '5\' 3"'$  (1.6 m)    General: Well developed, well nourished, in no acute distress  Skin : Warm and dry.  Head: Normocephalic and atraumatic  Eyes: Sclera and conjunctiva clear; pupils round and reactive to light; extraocular movements intact  Ears: External normal; canals clear; tympanic membranes normal  Oropharynx: Pink, supple. No suspicious lesions  Neck: Supple without thyromegaly, adenopathy  Lungs: Respirations unlabored; wheezing noted in all 4 lobes;  CVS exam: normal rate and regular rhythm.  Neurologic: Alert and oriented; speech intact; face symmetrical; moves all extremities well; CNII-XII intact without focal deficit   Assessment:  1. Cough, unspecified type   2. Acute exacerbation of COPD with asthma ([place])     Plan:  Duo-neb treatment given in office with some improvement; patient is adamant that she will NOT take prednisone; does agree to updating CXR today; will treat with Doxycycline and she is  encouraged to use her nebulizer at least 3 x per day for the next 2-3 days; would like to re-check in office on Friday; strict ER precautions discussed.   No follow-ups on file.  Orders Placed This Encounter  Procedures   DG Chest 2 View    Standing Status:   Future    Number of Occurrences:   1    Standing Expiration Date:   09/26/2022    Order Specific Question:   Reason for Exam (SYMPTOM  OR DIAGNOSIS REQUIRED)    Answer:   cough    Order Specific Question:   Preferred imaging location?    Answer:   [facility]    Requested Prescriptions   Signed Prescriptions Disp Refills   doxycycline (VIBRA-TABS) 100 MG tablet 14 tablet 0    Sig: Take 1 tablet (100 mg total) by mouth 2 (two) times daily.

## 2021-09-25 NOTE — Patient Instructions (Signed)
For the next 3 days, please use your nebulizer at least 3x per day; let's plan to have you do a follow up on Friday;

## 2021-09-26 ENCOUNTER — Encounter: Payer: Self-pay | Admitting: Family

## 2021-09-26 NOTE — Telephone Encounter (Signed)
I have called the pt to check up on her per provider request. She stated that she wanted to know about the imaging results and she is still feeling labored today. Pt reports that she is feeling better that she did yesterday and doesn't want t go to UC or ED.   I have informed her once her results get back I will let her know.   FYI to provider.

## 2021-09-28 ENCOUNTER — Encounter: Payer: Self-pay | Admitting: Family

## 2021-09-28 ENCOUNTER — Ambulatory Visit (INDEPENDENT_AMBULATORY_CARE_PROVIDER_SITE_OTHER): Payer: Medicare HMO | Admitting: Family

## 2021-09-28 VITALS — BP 122/60 | HR 86 | Temp 98.3°F | Ht 63.0 in | Wt 217.0 lb

## 2021-09-28 DIAGNOSIS — J449 Chronic obstructive pulmonary disease, unspecified: Secondary | ICD-10-CM | POA: Diagnosis not present

## 2021-09-28 DIAGNOSIS — I517 Cardiomegaly: Secondary | ICD-10-CM | POA: Diagnosis not present

## 2021-09-28 MED ORDER — PREDNISONE 20 MG PO TABS: 40.0000 mg | ORAL_TABLET | Freq: Every day | ORAL | 0 refills | Status: DC

## 2021-09-28 MED ORDER — METHYLPREDNISOLONE ACETATE 40 MG/ML IJ SUSP
40.0000 mg | Freq: Once | INTRAMUSCULAR | Status: AC
  Administered 2021-09-28: 40 mg via INTRAMUSCULAR

## 2021-09-28 NOTE — Progress Notes (Signed)
Jasmine Mclaughlin is a 71 y.o. female with the following history as recorded in Epic[employer]:  Patient Active Problem List   Diagnosis Date Noted   Acute non-recurrent pansinusitis 08/30/2021   Acute bronchitis with COPD ([place]) 08/30/2021   Depression with anxiety 05/10/2021   Major depressive disorder    Palpitations 11/28/2020   Hyperlipidemia 08/08/2020   History of COVID-19 08/2020   Primary hypertension 12/23/2019   Vitamin D deficiency 12/23/2019   Atypical chest pain 08/25/2019   Pneumonia    Osteoporosis    Genital warts    Tubular adenoma of colon 11/18/2017   Allergic rhinitis 01/24/2015   COPD (chronic obstructive pulmonary disease) 11/22/2013   Generalized anxiety disorder 11/13/2012   Degenerative disc disease, lumbar 10/14/2012   Obesity (BMI 30-39.9) 10/14/2012   Osteoarthritis 09/28/2010   Tobacco abuse 09/28/2010    Current Outpatient Medications  Medication Sig Dispense Refill   acetaminophen (TYLENOL) 650 MG CR tablet Take 650 mg by mouth 2 times daily at 12 noon and 4 pm. Patient takes 2 tablets twice a day     albuterol (PROVENTIL) (2.5 MG/3ML) 0.083% nebulizer solution Take 3 mLs (2.5 mg total) by nebulization every 4 (four) hours as needed for wheezing or shortness of breath. 150 mL 1   albuterol (VENTOLIN HFA) 108 (90 Base) MCG/ACT inhaler Inhale 2 puffs into the lungs every 6 (six) hours as needed for wheezing or shortness of breath. 1 each 5   aspirin EC 81 MG tablet Take 81 mg by mouth daily. Swallow whole.     Budeson-Glycopyrrol-Formoterol (BREZTRI AEROSPHERE) 160-9-4.8 MCG/ACT AERO Inhale 2 puffs into the lungs 2 (two) times daily. 10.7 g 5   calcium-vitamin D (OSCAL WITH D) 500-5 MG-MCG tablet Take 1 tablet by mouth.     chlorpheniramine (CHLOR-TRIMETON) 4 MG tablet Take 4 mg by mouth daily as needed for allergies.     citalopram (CELEXA) 40 MG tablet Take 1 tablet (40 mg total) by mouth daily. 90 tablet 1   co-enzyme Q-10 30 MG capsule Take 30 mg by mouth 3  (three) times daily.     diphenhydrAMINE (SOMINEX) 25 MG tablet Take 25 mg by mouth at bedtime.     doxycycline (VIBRA-TABS) 100 MG tablet Take 1 tablet (100 mg total) by mouth 2 (two) times daily. 14 tablet 0   fluticasone (FLONASE) 50 MCG/ACT nasal spray Place 2 sprays into both nostrils daily. 16 g 6   gabapentin (NEURONTIN) 300 MG capsule TAKE 1 CAPSULE BY MOUTH EVERYDAY AT BEDTIME 90 capsule 1   ipratropium (ATROVENT) 0.02 % nebulizer solution Take 2.5 mLs (0.5 mg total) by nebulization 4 (four) times daily. 75 mL 12   predniSONE (DELTASONE) 20 MG tablet Take 2 tablets (40 mg total) by mouth daily with breakfast. 10 tablet 0   simvastatin (ZOCOR) 20 MG tablet Take 1 tablet (20 mg total) by mouth at bedtime. 90 tablet 1   tiZANidine (ZANAFLEX) 4 MG capsule Take 1 capsule (4 mg total) by mouth 3 (three) times daily as needed for muscle spasms. 30 capsule 2   vitamin B-12 (CYANOCOBALAMIN) 1000 MCG tablet Take 1,000 mcg by mouth daily.     diltiazem (CARDIZEM CD) 180 MG 24 hr capsule Take 1 capsule (180 mg total) by mouth daily. 90 capsule 3   hydrochlorothiazide (MICROZIDE) 12.5 MG capsule Take 1 capsule (12.5 mg total) by mouth daily. 90 capsule 3   isosorbide mononitrate (IMDUR) 30 MG 24 hr tablet Take 0.5 tablets (15 mg total) by  mouth daily. 45 tablet 3   nitroGLYCERIN (NITROSTAT) 0.4 MG SL tablet Place 1 tablet (0.4 mg total) under the tongue every 5 (five) minutes as needed for chest pain. 90 tablet 3   No current facility-administered medications for this visit.    Allergies: Patient has no known allergies.  Past Medical History:  Diagnosis Date   Allergic rhinitis 01/24/2015   Asthma    Atypical chest pain 08/25/2019   Chest tightness 10/27/2013   COPD (chronic obstructive pulmonary disease)    Degenerative disc disease, lumbar 10/14/2012   Generalized anxiety disorder 11/13/2012   Genital warts    History of COVID-19 08/2020   Hyperlipidemia 08/08/2020   Major depressive  disorder    Obesity (BMI 30-39.9) 10/14/2012   Osteoarthritis 09/28/2010   Osteoporosis    Palpitations 11/28/2020   Pneumonia    Primary hypertension 12/23/2019   Shortness of breath    Sinusitis, acute 10/27/2013   Tobacco abuse 09/28/2010   Tubular adenoma of colon 11/18/2017   Vitamin D deficiency 12/23/2019   Wheezing 10/27/2013    Past Surgical History:  Procedure Laterality Date   ANTERIOR FUSION LUMBAR SPINE  05/06/2012   ANTERIOR LAT LUMBAR FUSION N/A 05/06/2012   Procedure: ANTERIOR LATERAL LUMBAR FUSION 3 LEVELS;  Surgeon: Sinclair Ship, MD;  Location: [facility];  Service: Orthopedics;  Laterality: N/A;  Lateral interbody fusion, lumbar 2-3,lumbar 3-4, lumbar 4-5 with instrumentation and allograft.   COLONOSCOPY     COLONOSCOPY WITH PROPOFOL N/A 12/15/2017   Procedure: COLONOSCOPY WITH PROPOFOL;  Surgeon: Rush Landmark Telford Nab., MD;  Location: [facility];  Service: Gastroenterology;  Laterality: N/A;   COLONOSCOPY WITH PROPOFOL N/A 07/08/2018   Procedure: COLONOSCOPY WITH PROPOFOL;  Surgeon: Rush Landmark Telford Nab., MD;  Location: [facility];  Service: Gastroenterology;  Laterality: N/A;   COLONOSCOPY WITH PROPOFOL N/A 09/01/2019   Procedure: COLONOSCOPY WITH PROPOFOL;  Surgeon: Rush Landmark Telford Nab., MD;  Location: [facility];  Service: Gastroenterology;  Laterality: N/A;   KNEE ARTHROSCOPY Right     R knee 01/2012, for ligament tears   POLYPECTOMY  07/08/2018   Procedure: POLYPECTOMY;  Surgeon: Mansouraty, Telford Nab., MD;  Location: [facility];  Service: Gastroenterology;;   POLYPECTOMY  09/01/2019   Procedure: POLYPECTOMY;  Surgeon: Irving Copas., MD;  Location: [facility];  Service: Gastroenterology;;   TONSILLECTOMY     VAGINAL DELIVERY     x1    Family History  Problem Relation Age of Onset   Arthritis Mother    Arthritis Father    Stroke Father    Colon cancer Father 65   Heart disease Father 60       chf   Breast cancer Maternal  Grandmother    Asthma Maternal Grandmother    Asthma Daughter    Esophageal cancer Neg Hx    Ulcerative colitis Neg Hx    Stomach cancer Neg Hx     Social History   Tobacco Use   Smoking status: Every Day    Packs/day: 0.50    Years: 55.00    Total pack years: 27.50    Types: Cigarettes   Smokeless tobacco: Never  Substance Use Topics   Alcohol use: Yes    Alcohol/week: 7.0 - 14.0 standard drinks of alcohol    Types: 7 - 14 Standard drinks or equivalent per week    Comment: at least 1 drink daily, but described alcohol use as "a habit" and that it "gets out of hand" at times    Subjective:  3 day follow up on acute COPD exacerbation; patient had deferred oral prednisone at last OV; has been doing her nebulizers 3 x per day as discussed and was feeling better until this morning; feels like breathing may be harder again today;  Would like to review CXR results as well; is interested in getting established with new specialists closer to her home- would like to see cardiology and pulmonology in [facility];     Objective:  Vitals:   09/28/21 1044  BP: 122/60  Pulse: 86  Temp: 98.3 F (36.8 C)  TempSrc: Oral  SpO2: 93%  Weight: 217 lb (98.4 kg)  Height: '5\' 3"'$  (1.6 m)    General: Well developed, well nourished, in no acute distress  Skin : Warm and dry.  Head: Normocephalic and atraumatic  Eyes: Sclera and conjunctiva clear; pupils round and reactive to light; extraocular movements intact  Ears: External normal; canals clear; tympanic membranes normal  Oropharynx: Pink, supple. No suspicious lesions  Neck: Supple without thyromegaly, adenopathy  Lungs: Respirations unlabored; clear to auscultation bilaterally without wheeze, rales, rhonchi  CVS exam: normal rate and regular rhythm.  Neurologic: Alert and oriented; speech intact; face symmetrical; moves all extremities well; CNII-XII intact without focal deficit   Assessment:  1. Chronic obstructive pulmonary disease,  unspecified COPD type ([place])   2. Cardiomegaly     Plan:  Patient agrees to use of prednisone today; Depo-Medrol IM 40 mg given in office; she will start oral prednisone 40 mg qd x 5 days tomorrow; continue nebulizers at least 2x per day for now; referral updated to pulmonology; Refer to cardiology in [place] as requested as well;  Will plan to check on patient early next week; ER precautions for upcoming weekend.   No follow-ups on file.  Orders Placed This Encounter  Procedures   Ambulatory referral to Pulmonology    Referral Priority:   Routine    Referral Type:   Consultation    Referral Reason:   Specialty Services Required    Requested Specialty:   Pulmonary Disease    Number of Visits Requested:   1   Ambulatory referral to Cardiology    Referral Priority:   Routine    Referral Type:   Consultation    Referral Reason:   Specialty Services Required    Requested Specialty:   Cardiology    Number of Visits Requested:   1    Requested Prescriptions   Signed Prescriptions Disp Refills   predniSONE (DELTASONE) 20 MG tablet 10 tablet 0    Sig: Take 2 tablets (40 mg total) by mouth daily with breakfast.

## 2021-10-02 ENCOUNTER — Telehealth: Payer: Self-pay | Admitting: Family

## 2021-10-02 NOTE — Telephone Encounter (Signed)
-----   Message from Marrian Salvage, [place] sent at 09/28/2021 12:12 PM EDT ----- Check on her response to prednisone

## 2021-10-02 NOTE — Telephone Encounter (Signed)
I have called pt back and she stated that she is feeling better. She has slight SHOB, but definitely better. Pt sounded clearer while talking on the phone as well. Pt reports she did well with the prednisone even though it was not her favorites but she was doing well.

## 2021-10-02 NOTE — Telephone Encounter (Signed)
Please call and check on her; how is her breathing?

## 2021-10-05 DIAGNOSIS — J449 Chronic obstructive pulmonary disease, unspecified: Secondary | ICD-10-CM | POA: Diagnosis not present

## 2021-10-05 DIAGNOSIS — R9389 Abnormal findings on diagnostic imaging of other specified body structures: Secondary | ICD-10-CM | POA: Diagnosis not present

## 2021-10-05 DIAGNOSIS — R69 Illness, unspecified: Secondary | ICD-10-CM | POA: Diagnosis not present

## 2021-10-05 DIAGNOSIS — F17210 Nicotine dependence, cigarettes, uncomplicated: Secondary | ICD-10-CM | POA: Diagnosis not present

## 2021-10-05 DIAGNOSIS — J4550 Severe persistent asthma, uncomplicated: Secondary | ICD-10-CM | POA: Diagnosis not present

## 2021-10-05 DIAGNOSIS — Z8701 Personal history of pneumonia (recurrent): Secondary | ICD-10-CM | POA: Diagnosis not present

## 2021-10-08 DIAGNOSIS — J449 Chronic obstructive pulmonary disease, unspecified: Secondary | ICD-10-CM | POA: Diagnosis not present

## 2021-10-08 DIAGNOSIS — H52223 Regular astigmatism, bilateral: Secondary | ICD-10-CM | POA: Diagnosis not present

## 2021-10-08 DIAGNOSIS — H43313 Vitreous membranes and strands, bilateral: Secondary | ICD-10-CM | POA: Diagnosis not present

## 2021-10-08 DIAGNOSIS — H5203 Hypermetropia, bilateral: Secondary | ICD-10-CM | POA: Diagnosis not present

## 2021-10-08 DIAGNOSIS — R69 Illness, unspecified: Secondary | ICD-10-CM | POA: Diagnosis not present

## 2021-10-19 DIAGNOSIS — J4550 Severe persistent asthma, uncomplicated: Secondary | ICD-10-CM | POA: Diagnosis not present

## 2021-10-20 DIAGNOSIS — J45909 Unspecified asthma, uncomplicated: Secondary | ICD-10-CM | POA: Diagnosis not present

## 2021-10-22 ENCOUNTER — Ambulatory Visit (HOSPITAL_COMMUNITY): Payer: Medicare HMO

## 2021-11-06 DIAGNOSIS — M533 Sacrococcygeal disorders, not elsewhere classified: Secondary | ICD-10-CM | POA: Diagnosis not present

## 2021-11-08 ENCOUNTER — Other Ambulatory Visit: Payer: Self-pay | Admitting: Cardiology

## 2021-11-09 ENCOUNTER — Encounter: Payer: Self-pay | Admitting: Cardiology

## 2021-11-09 MED ORDER — ISOSORBIDE MONONITRATE ER 30 MG PO TB24: 15.0000 mg | ORAL_TABLET | Freq: Every day | ORAL | 1 refills | Status: AC

## 2021-11-13 ENCOUNTER — Encounter: Payer: Self-pay | Admitting: Family Medicine

## 2021-11-13 ENCOUNTER — Ambulatory Visit (INDEPENDENT_AMBULATORY_CARE_PROVIDER_SITE_OTHER): Payer: Medicare HMO | Admitting: Family Medicine

## 2021-11-13 VITALS — BP 130/68 | HR 67 | Temp 98.7°F | Resp 18 | Ht 63.0 in | Wt 212.8 lb

## 2021-11-13 DIAGNOSIS — Z0000 Encounter for general adult medical examination without abnormal findings: Secondary | ICD-10-CM | POA: Insufficient documentation

## 2021-11-13 DIAGNOSIS — Z23 Encounter for immunization: Secondary | ICD-10-CM | POA: Diagnosis not present

## 2021-11-13 DIAGNOSIS — I10 Essential (primary) hypertension: Secondary | ICD-10-CM | POA: Diagnosis not present

## 2021-11-13 DIAGNOSIS — E785 Hyperlipidemia, unspecified: Secondary | ICD-10-CM | POA: Diagnosis not present

## 2021-11-13 DIAGNOSIS — J449 Chronic obstructive pulmonary disease, unspecified: Secondary | ICD-10-CM

## 2021-11-13 DIAGNOSIS — J0140 Acute pansinusitis, unspecified: Secondary | ICD-10-CM | POA: Diagnosis not present

## 2021-11-13 DIAGNOSIS — M62838 Other muscle spasm: Secondary | ICD-10-CM

## 2021-11-13 LAB — LIPID PANEL
Cholesterol: 119 mg/dL (ref 0–200)
HDL: 48.1 mg/dL (ref >39.00)
LDL Cholesterol: 43 mg/dL (ref 0–99)
NonHDL: 70.54
Total CHOL/HDL Ratio: 2
Triglycerides: 140 mg/dL (ref 0.0–149.0)
VLDL: 28 mg/dL (ref 0.0–40.0)

## 2021-11-13 LAB — COMPREHENSIVE METABOLIC PANEL
ALT: 15 U/L (ref 0–35)
AST: 10 U/L (ref 0–37)
Albumin: 4.3 g/dL (ref 3.5–5.2)
Alkaline Phosphatase: 49 U/L (ref 39–117)
BUN: 27 mg/dL — ABNORMAL HIGH (ref 6–23)
CO2: 31 mEq/L (ref 19–32)
Calcium: 10.2 mg/dL (ref 8.4–10.5)
Chloride: 98 mEq/L (ref 96–112)
Creatinine, Ser: 0.84 mg/dL (ref 0.40–1.20)
GFR: 70.4 mL/min (ref >60.00)
Glucose, Bld: 83 mg/dL (ref 70–99)
Potassium: 4.3 mEq/L (ref 3.5–5.1)
Sodium: 137 mEq/L (ref 135–145)
Total Bilirubin: 0.4 mg/dL (ref 0.2–1.2)
Total Protein: 6.7 g/dL (ref 6.0–8.3)

## 2021-11-13 LAB — CBC WITH DIFFERENTIAL/PLATELET
Basophils Absolute: 0 10*3/uL (ref 0.0–0.1)
Basophils Relative: 0.2 % (ref 0.0–3.0)
Eosinophils Absolute: 0.2 10*3/uL (ref 0.0–0.7)
Eosinophils Relative: 1.5 % (ref 0.0–5.0)
HCT: 40.1 % (ref 36.0–46.0)
Hemoglobin: 13.1 g/dL (ref 12.0–15.0)
Lymphocytes Relative: 27.3 % (ref 12.0–46.0)
Lymphs Abs: 4.2 10*3/uL — ABNORMAL HIGH (ref 0.7–4.0)
MCHC: 32.7 g/dL (ref 30.0–36.0)
MCV: 99.6 fl (ref 78.0–100.0)
Monocytes Absolute: 1.3 10*3/uL — ABNORMAL HIGH (ref 0.1–1.0)
Monocytes Relative: 8.3 % (ref 3.0–12.0)
Neutro Abs: 9.8 10*3/uL — ABNORMAL HIGH (ref 1.4–7.7)
Neutrophils Relative %: 62.7 % (ref 43.0–77.0)
Platelets: 365 10*3/uL (ref 150.0–400.0)
RBC: 4.02 Mil/uL (ref 3.87–5.11)
RDW: 13.6 % (ref 11.5–15.5)
WBC: 15.6 10*3/uL — ABNORMAL HIGH (ref 4.0–10.5)

## 2021-11-13 MED ORDER — SIMVASTATIN 20 MG PO TABS: 20.0000 mg | ORAL_TABLET | Freq: Every day | ORAL | 1 refills | Status: DC

## 2021-11-13 MED ORDER — FLUTICASONE PROPIONATE 50 MCG/ACT NA SUSP: 2.0000 | Freq: Every day | NASAL | 6 refills | Status: DC

## 2021-11-13 MED ORDER — TIZANIDINE HCL 4 MG PO CAPS: 4.0000 mg | ORAL_CAPSULE | Freq: Three times a day (TID) | ORAL | 2 refills | Status: DC | PRN

## 2021-11-13 MED ORDER — MONTELUKAST SODIUM 10 MG PO TABS: 10.0000 mg | ORAL_TABLET | Freq: Every day | ORAL | 3 refills | Status: AC

## 2021-11-13 NOTE — Progress Notes (Signed)
Subjective:   By signing my name below, I, [name], attest that this documentation has been prepared under the direction and in the presence of Ann Held, DO. 11/13/2021    Patient ID: Jasmine Mclaughlin, female    DOB: 1950/06/16, 71 y.o.   MRN: [id number]  Chief Complaint  Patient presents with   Annual Exam    Pt staes fasting     HPI Patient is in today for a comprehensive physical exam.   She is following up with a pulmonologist regularly. She was given Singulair during their last appointment and she reports her breathing has improved. She stopped taking breztri and reports no new issues since.  She reports developing new moles and has an appointment with her dermatologist in December 2023 to follow up on them.  She denies having any fever, new muscle pain, new joint pain, congestion, sinus pain, sore throat, chest pain, palpations, cough, SOB, wheezing, n/v/d, constipation, blood in stool, dysuria, frequency, hematuria, or headaches at this time. She has no changes to her family medical history. She has no new surgical procedures to report.  She no longer smoke tobacco products and stopped September, 2023. She was on a nicotine patch prescribed from her pulmonologist when she first started to quit. She continues craving the nicotine patches at this time.  She is UTD on RSV vaccines this year and reports receiving it 09/2021. She is receiving the flu vaccine during this visit. She is not interested in receiving the new Covid-19 booster vaccine.  She is participating in occasional exercise. She drinks around 1 carbonated beverage daily.    Past Medical History:  Diagnosis Date   Allergic rhinitis 01/24/2015   Asthma    Atypical chest pain 08/25/2019   Chest tightness 10/27/2013   COPD (chronic obstructive pulmonary disease)    Degenerative disc disease, lumbar 10/14/2012   Generalized anxiety disorder 11/13/2012   Genital warts    History of COVID-19 08/2020    Hyperlipidemia 08/08/2020   Major depressive disorder    Obesity (BMI 30-39.9) 10/14/2012   Osteoarthritis 09/28/2010   Osteoporosis    Palpitations 11/28/2020   Pneumonia    Primary hypertension 12/23/2019   Shortness of breath    Sinusitis, acute 10/27/2013   Tobacco abuse 09/28/2010   Tubular adenoma of Jasmine 11/18/2017   Vitamin D deficiency 12/23/2019   Wheezing 10/27/2013    Past Surgical History:  Procedure Laterality Date   ANTERIOR FUSION LUMBAR SPINE  05/06/2012   ANTERIOR LAT LUMBAR FUSION N/A 05/06/2012   Procedure: ANTERIOR LATERAL LUMBAR FUSION 3 LEVELS;  Surgeon: Sinclair Ship, MD;  Location: [place];  Service: Orthopedics;  Laterality: N/A;  Lateral interbody fusion, lumbar 2-3,lumbar 3-4, lumbar 4-5 with instrumentation and allograft.   COLONOSCOPY     COLONOSCOPY WITH PROPOFOL N/A 12/15/2017   Procedure: COLONOSCOPY WITH PROPOFOL;  Surgeon: Rush Landmark Telford Nab., MD;  Location: [place];  Service: Gastroenterology;  Laterality: N/A;   COLONOSCOPY WITH PROPOFOL N/A 07/08/2018   Procedure: COLONOSCOPY WITH PROPOFOL;  Surgeon: Rush Landmark Telford Nab., MD;  Location: [place];  Service: Gastroenterology;  Laterality: N/A;   COLONOSCOPY WITH PROPOFOL N/A 09/01/2019   Procedure: COLONOSCOPY WITH PROPOFOL;  Surgeon: Rush Landmark Telford Nab., MD;  Location: [facility];  Service: Gastroenterology;  Laterality: N/A;   KNEE ARTHROSCOPY Right     R knee 01/2012, for ligament tears   POLYPECTOMY  07/08/2018   Procedure: POLYPECTOMY;  Surgeon: Mansouraty, Telford Nab., MD;  Location: [place];  Service: Gastroenterology;;   POLYPECTOMY  09/01/2019   Procedure: POLYPECTOMY;  Surgeon: Irving Copas., MD;  Location: [facility];  Service: Gastroenterology;;   TONSILLECTOMY     VAGINAL DELIVERY     x1    Family History  Problem Relation Age of Onset   Arthritis Mother    Atrial fibrillation Mother    Arthritis Father    Stroke Father    Jasmine cancer  Father 60   Heart disease Father 62       chf   Breast cancer Maternal Grandmother    Asthma Maternal Grandmother    Asthma Daughter    Esophageal cancer Neg Hx    Ulcerative colitis Neg Hx    Stomach cancer Neg Hx     Social History   Socioeconomic History   Marital status: Single    Spouse name: Not on file   Number of children: 1   Years of education: 12   Highest education level: High school graduate  Occupational History   Occupation: Retired    Comment: Press photographer  Tobacco Use   Smoking status: Former    Packs/day: 0.50    Years: 55.00    Total pack years: 27.50    Types: Cigarettes    Quit date: 09/19/2021    Years since quitting: 0.1   Smokeless tobacco: Never  Vaping Use   Vaping Use: Never used  Substance and Sexual Activity   Alcohol use: Yes    Alcohol/week: 7.0 - 14.0 standard drinks of alcohol    Types: 7 - 14 Standard drinks or equivalent per week    Comment: at least 1 drink daily, but described alcohol use as "a habit" and that it "gets out of hand" at times   Drug use: No   Sexual activity: Not Currently    Partners: Male  Other Topics Concern   Not on file  Social History Narrative   1 caffeine drinks daily   No exercise   Left handed   One story home   Social Determinants of Health   Financial Resource Strain: Low Risk  (11/09/2020)   Overall Financial Resource Strain (CARDIA)    Difficulty of Paying Living Expenses: Not hard at all  Food Insecurity: No Food Insecurity (11/09/2020)   Hunger Vital Sign    Worried About Running Out of Food in the Last Year: Never true    Ran Out of Food in the Last Year: Never true  Transportation Needs: No Transportation Needs (11/09/2020)   PRAPARE - Hydrologist (Medical): No    Lack of Transportation (Non-Medical): No  Physical Activity: Insufficiently Active (11/09/2020)   Exercise Vital Sign    Days of Exercise per Week: 7 days    Minutes of Exercise per Session: 10 min   Stress: No Stress Concern Present (11/09/2020)   [place]    Feeling of Stress : Not at all  Social Connections: Not on file  Intimate Partner Violence: Not At Risk (11/09/2020)   Humiliation, Afraid, Rape, and Kick questionnaire    Fear of Current or Ex-Partner: No    Emotionally Abused: No    Physically Abused: No    Sexually Abused: No    Outpatient Medications Prior to Visit  Medication Sig Dispense Refill   acetaminophen (TYLENOL) 650 MG CR tablet Take 650 mg by mouth 2 times daily at 12 noon and 4 pm. Patient takes 2 tablets twice a day  albuterol (PROVENTIL) (2.5 MG/3ML) 0.083% nebulizer solution Take 3 mLs (2.5 mg total) by nebulization every 4 (four) hours as needed for wheezing or shortness of breath. 150 mL 1   albuterol (VENTOLIN HFA) 108 (90 Base) MCG/ACT inhaler Inhale 2 puffs into the lungs every 6 (six) hours as needed for wheezing or shortness of breath. 1 each 5   aspirin EC 81 MG tablet Take 81 mg by mouth daily. Swallow whole.     calcium-vitamin D (OSCAL WITH D) 500-5 MG-MCG tablet Take 1 tablet by mouth.     chlorpheniramine (CHLOR-TRIMETON) 4 MG tablet Take 4 mg by mouth daily as needed for allergies.     citalopram (CELEXA) 40 MG tablet Take 1 tablet (40 mg total) by mouth daily. 90 tablet 1   co-enzyme Q-10 30 MG capsule Take 30 mg by mouth 3 (three) times daily.     diphenhydrAMINE (SOMINEX) 25 MG tablet Take 25 mg by mouth at bedtime.     gabapentin (NEURONTIN) 300 MG capsule TAKE 1 CAPSULE BY MOUTH EVERYDAY AT BEDTIME 90 capsule 1   ipratropium (ATROVENT) 0.02 % nebulizer solution Take 2.5 mLs (0.5 mg total) by nebulization 4 (four) times daily. 75 mL 12   isosorbide mononitrate (IMDUR) 30 MG 24 hr tablet Take 0.5 tablets (15 mg total) by mouth daily. 45 tablet 1   vitamin B-12 (CYANOCOBALAMIN) 1000 MCG tablet Take 1,000 mcg by mouth daily.     fluticasone (FLONASE) 50 MCG/ACT nasal spray  Place 2 sprays into both nostrils daily. 16 g 6   simvastatin (ZOCOR) 20 MG tablet Take 1 tablet (20 mg total) by mouth at bedtime. 90 tablet 1   tiZANidine (ZANAFLEX) 4 MG capsule Take 1 capsule (4 mg total) by mouth 3 (three) times daily as needed for muscle spasms. 30 capsule 2   diltiazem (CARDIZEM CD) 180 MG 24 hr capsule Take 1 capsule (180 mg total) by mouth daily. 90 capsule 3   hydrochlorothiazide (MICROZIDE) 12.5 MG capsule Take 1 capsule (12.5 mg total) by mouth daily. 90 capsule 3   nitroGLYCERIN (NITROSTAT) 0.4 MG SL tablet Place 1 tablet (0.4 mg total) under the tongue every 5 (five) minutes as needed for chest pain. 90 tablet 3   Budeson-Glycopyrrol-Formoterol (BREZTRI AEROSPHERE) 160-9-4.8 MCG/ACT AERO Inhale 2 puffs into the lungs 2 (two) times daily. 10.7 g 5   doxycycline (VIBRA-TABS) 100 MG tablet Take 1 tablet (100 mg total) by mouth 2 (two) times daily. 14 tablet 0   predniSONE (DELTASONE) 20 MG tablet Take 2 tablets (40 mg total) by mouth daily with breakfast. 10 tablet 0   No facility-administered medications prior to visit.    No Known Allergies  Review of Systems  Constitutional:  Negative for fever and malaise/fatigue.  HENT:  Negative for congestion, sinus pain and sore throat.   Eyes:  Negative for blurred vision.  Respiratory:  Negative for cough, shortness of breath and wheezing.   Cardiovascular:  Negative for chest pain, palpitations and leg swelling.  Gastrointestinal:  Negative for abdominal pain, blood in stool, constipation, diarrhea, nausea and vomiting.  Genitourinary:  Negative for dysuria, frequency and hematuria.  Musculoskeletal:  Negative for falls.       (-)new muscle pain (-)new joint pain  Skin:  Negative for rash.       (+)New moles  Neurological:  Negative for dizziness, loss of consciousness and headaches.  Endo/Heme/Allergies:  Negative for environmental allergies.  Psychiatric/Behavioral:  Negative for depression. The patient is not  nervous/anxious.  Objective:    Physical Exam Vitals and nursing note reviewed.  Constitutional:      General: She is not in acute distress.    Appearance: Normal appearance. She is well-developed. She is not ill-appearing.  HENT:     Head: Normocephalic and atraumatic.     Right Ear: Tympanic membrane, ear canal and external ear normal.     Left Ear: Tympanic membrane, ear canal and external ear normal.  Eyes:     Extraocular Movements: Extraocular movements intact.     Conjunctiva/sclera: Conjunctivae normal.     Pupils: Pupils are equal, round, and reactive to light.  Neck:     Thyroid: No thyromegaly.     Vascular: No carotid bruit or JVD.  Cardiovascular:     Rate and Rhythm: Normal rate and regular rhythm.     Heart sounds: Normal heart sounds. No murmur heard.    No gallop.  Pulmonary:     Effort: Pulmonary effort is normal. No respiratory distress.     Breath sounds: Normal breath sounds. No wheezing or rales.  Chest:     Chest wall: No tenderness.  Abdominal:     General: Bowel sounds are normal. There is no distension.     Palpations: Abdomen is soft.     Tenderness: There is no abdominal tenderness. There is no guarding.  Musculoskeletal:     Cervical back: Normal range of motion and neck supple.  Skin:    General: Skin is warm and dry.  Neurological:     Mental Status: She is alert and oriented to person, place, and time.  Psychiatric:        Judgment: Judgment normal.     BP 130/68 (BP Location: Left Arm, Patient Position: Sitting, Cuff Size: Normal)   Pulse 67   Temp 98.7 F (37.1 C) (Oral)   Resp 18   Ht _0  (1.6 m)   Wt 212 lb 12.8 oz (96.5 kg)   SpO2 97%   BMI 37.70 kg/m  Wt Readings from Last 3 Encounters:  11/13/21 212 lb 12.8 oz (96.5 kg)  09/28/21 217 lb (98.4 kg)  09/25/21 217 lb 12.8 oz (98.8 kg)    Diabetic Foot Exam - Simple   No data filed    Lab Results  Component Value Date   WBC 12.5 (H) 05/10/2021   HGB 13.3  05/10/2021   HCT 40.4 05/10/2021   PLT 355.0 05/10/2021   GLUCOSE 95 05/10/2021   CHOL 142 05/10/2021   TRIG 171.0 (H) 05/10/2021   HDL 53.90 05/10/2021   LDLDIRECT 135.0 12/23/2019   LDLCALC 53 05/10/2021   ALT 14 05/10/2021   AST 14 05/10/2021   NA 136 05/10/2021   K 4.5 05/10/2021   CL 97 05/10/2021   CREATININE 0.91 05/10/2021   BUN 21 05/10/2021   CO2 29 05/10/2021   TSH 0.95 09/23/2019   INR 1.0 11/14/2017    Lab Results  Component Value Date   TSH 0.95 09/23/2019   Lab Results  Component Value Date   WBC 12.5 (H) 05/10/2021   HGB 13.3 05/10/2021   HCT 40.4 05/10/2021   MCV 102.1 (H) 05/10/2021   PLT 355.0 05/10/2021   Lab Results  Component Value Date   NA 136 05/10/2021   K 4.5 05/10/2021   CO2 29 05/10/2021   GLUCOSE 95 05/10/2021   BUN 21 05/10/2021   CREATININE 0.91 05/10/2021   BILITOT 0.5 05/10/2021   ALKPHOS 70 05/10/2021   AST 14 05/10/2021  ALT 14 05/10/2021   PROT 7.2 05/10/2021   ALBUMIN 4.5 05/10/2021   CALCIUM 9.9 05/10/2021   EGFR 77 03/13/2020   GFR 64.18 05/10/2021   Lab Results  Component Value Date   CHOL 142 05/10/2021   Lab Results  Component Value Date   HDL 53.90 05/10/2021   Lab Results  Component Value Date   LDLCALC 53 05/10/2021   Lab Results  Component Value Date   TRIG 171.0 (H) 05/10/2021   Lab Results  Component Value Date   CHOLHDL 3 05/10/2021   No results found for: "HGBA1C"  Mammogram- Last completed 04/20/2021. Results are normal. Repeat in 1 year.  Dexa- Last completed 04/19/2021. Results showed she is osteoporotic. Repeat in 2 years.  Pap smear- Last completed 08/29/17. Results are normal.  Colonoscopy- Last completed 09/01/2019. Results showed: - Hemorrhoids found on digital rectal exam. - No evidence of recurrent polypoid tissue at prior resection site. - Diverticulosis in the recto-sigmoid Jasmine, in the sigmoid Jasmine, in the descending Jasmine and in the ascending Jasmine. - One 4 mm polyp in  the descending Jasmine adjacent/invading a diverticulum, removed with a cold snare. Resected and retrieved. - Normal mucosa in the entire examined Jasmine otherwise. - Non-bleeding non-thrombosed external and internal hemorrhoids. - Repeat in 3 years.      Assessment & Plan:   Problem List Items Addressed This Visit       Unprioritized   Primary hypertension    Well controlled, no changes to meds. Encouraged heart healthy diet such as the DASH diet and exercise as tolerated.        Relevant Medications   simvastatin (ZOCOR) 20 MG tablet   Preventative health care - Primary    Ghm utd Check labs  See AVS       Hyperlipidemia    Tolerating statin, encouraged heart healthy diet, avoid trans fats, minimize simple carbs and saturated fats. Increase exercise as tolerated       Relevant Medications   simvastatin (ZOCOR) 20 MG tablet   Other Relevant Orders   CBC with Differential/Platelet   Comprehensive metabolic panel   Lipid panel   COPD (chronic obstructive pulmonary disease)   Relevant Medications   fluticasone (FLONASE) 50 MCG/ACT nasal spray   montelukast (SINGULAIR) 10 MG tablet   Other Relevant Orders   CBC with Differential/Platelet   Comprehensive metabolic panel   Lipid panel   Acute non-recurrent pansinusitis   Relevant Medications   fluticasone (FLONASE) 50 MCG/ACT nasal spray   Other Visit Diagnoses     Muscle spasm       Relevant Medications   tiZANidine (ZANAFLEX) 4 MG capsule   Need for influenza vaccination       Relevant Orders   Flu Vaccine QUAD High Dose(Fluad) (Completed)   Morbid obesity (HCC)       Relevant Orders   Amb Ref to Medical Weight Management   Insulin, random        Meds ordered this encounter  Medications   simvastatin (ZOCOR) 20 MG tablet    Sig: Take 1 tablet (20 mg total) by mouth at bedtime.    Dispense:  90 tablet    Refill:  1   tiZANidine (ZANAFLEX) 4 MG capsule    Sig: Take 1 capsule (4 mg total) by mouth 3  (three) times daily as needed for muscle spasms.    Dispense:  30 capsule    Refill:  2   fluticasone (FLONASE) 50 MCG/ACT nasal spray  Sig: Place 2 sprays into both nostrils daily.    Dispense:  16 g    Refill:  6   montelukast (SINGULAIR) 10 MG tablet    Sig: Take 1 tablet (10 mg total) by mouth at bedtime.    Dispense:  30 tablet    Refill:  3    I, Ann Held, DO, personally preformed the services described in this documentation.  All medical record entries made by the scribe were at my direction and in my presence.  I have reviewed the chart and discharge instructions (if applicable) and agree that the record reflects my personal performance and is accurate and complete. 11/13/2021   I,[name],acting as a scribe for Ann Held, DO.,have documented all relevant documentation on the behalf of Ann Held, DO,as directed by  Ann Held, DO while in the presence of Ann Held, DO.   Ann Held, DO

## 2021-11-13 NOTE — Assessment & Plan Note (Signed)
Ghm utd Check labs  See AVS  

## 2021-11-13 NOTE — Assessment & Plan Note (Signed)
Tolerating statin, encouraged heart healthy diet, avoid trans fats, minimize simple carbs and saturated fats. Increase exercise as tolerated 

## 2021-11-13 NOTE — Assessment & Plan Note (Signed)
Well controlled, no changes to meds. Encouraged heart healthy diet such as the DASH diet and exercise as tolerated.  °

## 2021-11-14 LAB — INSULIN, RANDOM: Insulin: 17.3 u[IU]/mL

## 2021-11-15 ENCOUNTER — Ambulatory Visit (INDEPENDENT_AMBULATORY_CARE_PROVIDER_SITE_OTHER): Payer: Medicare HMO | Admitting: *Deleted

## 2021-11-15 DIAGNOSIS — Z0000 Encounter for general adult medical examination without abnormal findings: Secondary | ICD-10-CM

## 2021-11-15 NOTE — Patient Instructions (Signed)
[name] , Thank you for taking time to come for your Medicare Wellness Visit. I appreciate your ongoing commitment to your health goals. Please review the following plan we discussed and let me know if I can assist you in the future.   These are the goals we discussed:  Goals      DIET - INCREASE WATER INTAKE     At least 3 glasses per day.      Patient Stated     Would like to get in better shape        This is a list of the screening recommended for you and due dates:  Health Maintenance  Topic Date Due   COVID-19 Vaccine (5 - Pfizer risk series) 02/12/2021   Screening for Lung Cancer  10/20/2021   Zoster (Shingles) Vaccine (1 of 2) 02/13/2022*   Mammogram  04/20/2022   Medicare Annual Wellness Visit  11/16/2022   Colon Cancer Screening  08/31/2024   Tetanus Vaccine  01/13/2030   Pneumonia Vaccine  Completed   Flu Shot  Completed   DEXA scan (bone density measurement)  Completed   Hepatitis C Screening: USPSTF Recommendation to screen - Ages 62-79 yo.  Completed   HPV Vaccine  Aged Out  *Topic was postponed. The date shown is not the original due date.     Next appointment: Follow up in one year for your annual wellness visit.   Preventive Care 42 Years and Older, Female Preventive care refers to lifestyle choices and visits with your health care provider that can promote health and wellness. What does preventive care include? A yearly physical exam. This is also called an annual well check. Dental exams once or twice a year. Routine eye exams. Ask your health care provider how often you should have your eyes checked. Personal lifestyle choices, including: Daily care of your teeth and gums. Regular physical activity. Eating a healthy diet. Avoiding tobacco and drug use. Limiting alcohol use. Practicing safe sex. Taking low-dose aspirin every day. Taking vitamin and mineral supplements as recommended by your health care provider. What happens during an annual  well check? The services and screenings done by your health care provider during your annual well check will depend on your age, overall health, lifestyle risk factors, and family history of disease. Counseling  Your health care provider may ask you questions about your: Alcohol use. Tobacco use. Drug use. Emotional well-being. Home and relationship well-being. Sexual activity. Eating habits. History of falls. Memory and ability to understand (cognition). Work and work Statistician. Reproductive health. Screening  You may have the following tests or measurements: Height, weight, and BMI. Blood pressure. Lipid and cholesterol levels. These may be checked every 5 years, or more frequently if you are over 22 years old. Skin check. Lung cancer screening. You may have this screening every year starting at age 81 if you have a 30-pack-year history of smoking and currently smoke or have quit within the past 15 years. Fecal occult blood test (FOBT) of the stool. You may have this test every year starting at age 39. Flexible sigmoidoscopy or colonoscopy. You may have a sigmoidoscopy every 5 years or a colonoscopy every 10 years starting at age 60. Hepatitis C blood test. Hepatitis B blood test. Sexually transmitted disease (STD) testing. Diabetes screening. This is done by checking your blood sugar (glucose) after you have not eaten for a while (fasting). You may have this done every 1-3 years. Bone density scan. This is done to screen  for osteoporosis. You may have this done starting at age 5. Mammogram. This may be done every 1-2 years. Talk to your health care provider about how often you should have regular mammograms. Talk with your health care provider about your test results, treatment options, and if necessary, the need for more tests. Vaccines  Your health care provider may recommend certain vaccines, such as: Influenza vaccine. This is recommended every year. Tetanus, diphtheria,  and acellular pertussis (Tdap, Td) vaccine. You may need a Td booster every 10 years. Zoster vaccine. You may need this after age 15. Pneumococcal 13-valent conjugate (PCV13) vaccine. One dose is recommended after age 44. Pneumococcal polysaccharide (PPSV23) vaccine. One dose is recommended after age 9. Talk to your health care provider about which screenings and vaccines you need and how often you need them. This information is not intended to replace advice given to you by your health care provider. Make sure you discuss any questions you have with your health care provider. Document Released: 01/20/2015 Document Revised: 09/13/2015 Document Reviewed: 10/25/2014 Elsevier Interactive Patient Education  2017 [place] Prevention in the Home Falls can cause injuries. They can happen to people of all ages. There are many things you can do to make your home safe and to help prevent falls. What can I do on the outside of my home? Regularly fix the edges of walkways and driveways and fix any cracks. Remove anything that might make you trip as you walk through a door, such as a raised step or threshold. Trim any bushes or trees on the path to your home. Use bright outdoor lighting. Clear any walking paths of anything that might make someone trip, such as rocks or tools. Regularly check to see if handrails are loose or broken. Make sure that both sides of any steps have handrails. Any raised decks and porches should have guardrails on the edges. Have any leaves, snow, or ice cleared regularly. Use sand or salt on walking paths during winter. Clean up any spills in your garage right away. This includes oil or grease spills. What can I do in the bathroom? Use night lights. Install grab bars by the toilet and in the tub and shower. Do not use towel bars as grab bars. Use non-skid mats or decals in the tub or shower. If you need to sit down in the shower, use a plastic, non-slip  stool. Keep the floor dry. Clean up any water that spills on the floor as soon as it happens. Remove soap buildup in the tub or shower regularly. Attach bath mats securely with double-sided non-slip rug tape. Do not have throw rugs and other things on the floor that can make you trip. What can I do in the bedroom? Use night lights. Make sure that you have a light by your bed that is easy to reach. Do not use any sheets or blankets that are too big for your bed. They should not hang down onto the floor. Have a firm chair that has side arms. You can use this for support while you get dressed. Do not have throw rugs and other things on the floor that can make you trip. What can I do in the kitchen? Clean up any spills right away. Avoid walking on wet floors. Keep items that you use a lot in easy-to-reach places. If you need to reach something above you, use a strong step stool that has a grab bar. Keep electrical cords out of the way. Do  not use floor polish or wax that makes floors slippery. If you must use wax, use non-skid floor wax. Do not have throw rugs and other things on the floor that can make you trip. What can I do with my stairs? Do not leave any items on the stairs. Make sure that there are handrails on both sides of the stairs and use them. Fix handrails that are broken or loose. Make sure that handrails are as long as the stairways. Check any carpeting to make sure that it is firmly attached to the stairs. Fix any carpet that is loose or worn. Avoid having throw rugs at the top or bottom of the stairs. If you do have throw rugs, attach them to the floor with carpet tape. Make sure that you have a light switch at the top of the stairs and the bottom of the stairs. If you do not have them, ask someone to add them for you. What else can I do to help prevent falls? Wear shoes that: Do not have high heels. Have rubber bottoms. Are comfortable and fit you well. Are closed at the  toe. Do not wear sandals. If you use a stepladder: Make sure that it is fully opened. Do not climb a closed stepladder. Make sure that both sides of the stepladder are locked into place. Ask someone to hold it for you, if possible. Clearly mark and make sure that you can see: Any grab bars or handrails. First and last steps. Where the edge of each step is. Use tools that help you move around (mobility aids) if they are needed. These include: Canes. Walkers. Scooters. Crutches. Turn on the lights when you go into a dark area. Replace any light bulbs as soon as they burn out. Set up your furniture so you have a clear path. Avoid moving your furniture around. If any of your floors are uneven, fix them. If there are any pets around you, be aware of where they are. Review your medicines with your doctor. Some medicines can make you feel dizzy. This can increase your chance of falling. Ask your doctor what other things that you can do to help prevent falls. This information is not intended to replace advice given to you by your health care provider. Make sure you discuss any questions you have with your health care provider. Document Released: 10/20/2008 Document Revised: 06/01/2015 Document Reviewed: 01/28/2014 Elsevier Interactive Patient Education  2017 [employer].

## 2021-11-15 NOTE — Progress Notes (Signed)
Subjective:   Jasmine Mclaughlin is a 72 y.o. female who presents for Medicare Annual (Subsequent) preventive examination.  I connected with  Connye Burkitt Siller on 11/15/21 by a audio enabled telemedicine application and verified that I am speaking with the correct person using two identifiers.  Patient Location: Home  Provider Location: Office/Clinic  I discussed the limitations of evaluation and management by telemedicine. The patient expressed understanding and agreed to proceed.   Review of Systems    Defer to PCP Cardiac Risk Factors include: advanced age (>59mn, >>41women);dyslipidemia;hypertension     Objective:    There were no vitals filed for this visit. There is no height or weight on file to calculate BMI.     11/15/2021   11:02 AM 01/05/2021   10:28 AM 11/09/2020   10:25 AM 03/13/2020   10:55 AM 09/09/2019    8:55 AM 09/01/2019   10:14 AM 09/08/2018    9:10 AM  Advanced Directives  Does Patient Have a Medical Advance Directive? No No No No No No No  Would patient like information on creating a medical advance directive? No - Patient declined  No - Patient declined  No - Patient declined No - Patient declined No - Patient declined    Current Medications (verified) Outpatient Encounter Medications as of 11/15/2021  Medication Sig   acetaminophen (TYLENOL) 650 MG CR tablet Take 650 mg by mouth 2 times daily at 12 noon and 4 pm. Patient takes 2 tablets twice a day   albuterol (PROVENTIL) (2.5 MG/3ML) 0.083% nebulizer solution Take 3 mLs (2.5 mg total) by nebulization every 4 (four) hours as needed for wheezing or shortness of breath.   albuterol (VENTOLIN HFA) 108 (90 Base) MCG/ACT inhaler Inhale 2 puffs into the lungs every 6 (six) hours as needed for wheezing or shortness of breath.   aspirin EC 81 MG tablet Take 81 mg by mouth daily. Swallow whole.   calcium-vitamin D (OSCAL WITH D) 500-5 MG-MCG tablet Take 1 tablet by mouth.   chlorpheniramine (CHLOR-TRIMETON) 4 MG tablet Take 4  mg by mouth daily as needed for allergies.   citalopram (CELEXA) 40 MG tablet Take 1 tablet (40 mg total) by mouth daily.   co-enzyme Q-10 30 MG capsule Take 30 mg by mouth 3 (three) times daily.   diltiazem (CARDIZEM CD) 180 MG 24 hr capsule Take 1 capsule (180 mg total) by mouth daily.   diphenhydrAMINE (SOMINEX) 25 MG tablet Take 25 mg by mouth at bedtime.   fluticasone (FLONASE) 50 MCG/ACT nasal spray Place 2 sprays into both nostrils daily.   gabapentin (NEURONTIN) 300 MG capsule TAKE 1 CAPSULE BY MOUTH EVERYDAY AT BEDTIME   hydrochlorothiazide (MICROZIDE) 12.5 MG capsule Take 1 capsule (12.5 mg total) by mouth daily.   ipratropium (ATROVENT) 0.02 % nebulizer solution Take 2.5 mLs (0.5 mg total) by nebulization 4 (four) times daily.   isosorbide mononitrate (IMDUR) 30 MG 24 hr tablet Take 0.5 tablets (15 mg total) by mouth daily.   montelukast (SINGULAIR) 10 MG tablet Take 1 tablet (10 mg total) by mouth at bedtime.   nitroGLYCERIN (NITROSTAT) 0.4 MG SL tablet Place 1 tablet (0.4 mg total) under the tongue every 5 (five) minutes as needed for chest pain.   simvastatin (ZOCOR) 20 MG tablet Take 1 tablet (20 mg total) by mouth at bedtime.   tiZANidine (ZANAFLEX) 4 MG capsule Take 1 capsule (4 mg total) by mouth 3 (three) times daily as needed for muscle spasms.  vitamin B-12 (CYANOCOBALAMIN) 1000 MCG tablet Take 1,000 mcg by mouth daily.   No facility-administered encounter medications on file as of 11/15/2021.    Allergies (verified) Patient has no known allergies.   History: Past Medical History:  Diagnosis Date   Allergic rhinitis 01/24/2015   Asthma    Atypical chest pain 08/25/2019   Chest tightness 10/27/2013   COPD (chronic obstructive pulmonary disease)    Degenerative disc disease, lumbar 10/14/2012   Generalized anxiety disorder 11/13/2012   Genital warts    History of COVID-19 08/2020   Hyperlipidemia 08/08/2020   Major depressive disorder    Obesity (BMI 30-39.9)  10/14/2012   Osteoarthritis 09/28/2010   Osteoporosis    Palpitations 11/28/2020   Pneumonia    Primary hypertension 12/23/2019   Shortness of breath    Sinusitis, acute 10/27/2013   Tobacco abuse 09/28/2010   Tubular adenoma of colon 11/18/2017   Vitamin D deficiency 12/23/2019   Wheezing 10/27/2013   Past Surgical History:  Procedure Laterality Date   ANTERIOR FUSION LUMBAR SPINE  05/06/2012   ANTERIOR LAT LUMBAR FUSION N/A 05/06/2012   Procedure: ANTERIOR LATERAL LUMBAR FUSION 3 LEVELS;  Surgeon: Sinclair Ship, MD;  Location: [facility];  Service: Orthopedics;  Laterality: N/A;  Lateral interbody fusion, lumbar 2-3,lumbar 3-4, lumbar 4-5 with instrumentation and allograft.   COLONOSCOPY     COLONOSCOPY WITH PROPOFOL N/A 12/15/2017   Procedure: COLONOSCOPY WITH PROPOFOL;  Surgeon: Rush Landmark Telford Nab., MD;  Location: [facility];  Service: Gastroenterology;  Laterality: N/A;   COLONOSCOPY WITH PROPOFOL N/A 07/08/2018   Procedure: COLONOSCOPY WITH PROPOFOL;  Surgeon: Rush Landmark Telford Nab., MD;  Location: [facility];  Service: Gastroenterology;  Laterality: N/A;   COLONOSCOPY WITH PROPOFOL N/A 09/01/2019   Procedure: COLONOSCOPY WITH PROPOFOL;  Surgeon: Rush Landmark Telford Nab., MD;  Location: [facility];  Service: Gastroenterology;  Laterality: N/A;   KNEE ARTHROSCOPY Right     R knee 01/2012, for ligament tears   POLYPECTOMY  07/08/2018   Procedure: POLYPECTOMY;  Surgeon: Mansouraty, Telford Nab., MD;  Location: [facility] ENDOSCOPY;  Service: Gastroenterology;;   POLYPECTOMY  09/01/2019   Procedure: POLYPECTOMY;  Surgeon: Irving Copas., MD;  Location: [facility];  Service: Gastroenterology;;   TONSILLECTOMY     VAGINAL DELIVERY     x1   Family History  Problem Relation Age of Onset   Arthritis Mother    Atrial fibrillation Mother    Arthritis Father    Stroke Father    Colon cancer Father 110   Heart disease Father 62       chf   Breast cancer Maternal Grandmother     Asthma Maternal Grandmother    Asthma Daughter    Esophageal cancer Neg Hx    Ulcerative colitis Neg Hx    Stomach cancer Neg Hx    Social History   Socioeconomic History   Marital status: Single    Spouse name: Not on file   Number of children: 1   Years of education: 12   Highest education level: High school graduate  Occupational History   Occupation: Retired    Comment: Press photographer  Tobacco Use   Smoking status: Former    Packs/day: 0.50    Years: 55.00    Total pack years: 27.50    Types: Cigarettes    Quit date: 09/19/2021    Years since quitting: 0.1   Smokeless tobacco: Never  Vaping Use   Vaping Use: Never used  Substance and Sexual Activity  Alcohol use: Yes    Alcohol/week: 7.0 - 14.0 standard drinks of alcohol    Types: 7 - 14 Standard drinks or equivalent per week    Comment: at least 1 drink daily, but described alcohol use as "a habit" and that it "gets out of hand" at times   Drug use: No   Sexual activity: Not Currently    Partners: Male  Other Topics Concern   Not on file  Social History Narrative   1 caffeine drinks daily   No exercise   Left handed   One story home   Social Determinants of Health   Financial Resource Strain: Low Risk  (11/09/2020)   Overall Financial Resource Strain (CARDIA)    Difficulty of Paying Living Expenses: Not hard at all  Food Insecurity: No Food Insecurity (11/15/2021)   Hunger Vital Sign    Worried About Running Out of Food in the Last Year: Never true    Ran Out of Food in the Last Year: Never true  Transportation Needs: No Transportation Needs (11/15/2021)   PRAPARE - Hydrologist (Medical): No    Lack of Transportation (Non-Medical): No  Physical Activity: Insufficiently Active (11/09/2020)   Exercise Vital Sign    Days of Exercise per Week: 7 days    Minutes of Exercise per Session: 10 min  Stress: No Stress Concern Present (11/09/2020)   [place]    Feeling of Stress : Not at all  Social Connections: Moderately Isolated (11/15/2021)   Social Connection and Isolation Panel [NHANES]    Frequency of Communication with Friends and Family: Three times a week    Frequency of Social Gatherings with Friends and Family: Never    Attends Religious Services: 1 to 4 times per year    Active Member of [employer] or Organizations: No    Attends Music therapist: Never    Marital Status: Divorced    Tobacco Counseling Counseling given: Not Answered   Clinical Intake:  Pre-visit preparation completed: Yes  Pain : No/denies pain  Diabetes: No  How often do you need to have someone help you when you read instructions, pamphlets, or other written materials from your doctor or pharmacy?: 1 - Never   Activities of Daily Living    11/15/2021   11:07 AM  In your present state of health, do you have any difficulty performing the following activities:  Hearing? 1  Vision? 0  Difficulty concentrating or making decisions? 0  Walking or climbing stairs? 1  Dressing or bathing? 0  Doing errands, shopping? 0  Preparing Food and eating ? N  Using the Toilet? N  In the past six months, have you accidently leaked urine? Y  Do you have problems with loss of bowel control? N  Managing your Medications? N  Managing your Finances? N  Housekeeping or managing your Housekeeping? N    Patient Care Team: Carollee Herter, Alferd Apa, DO as PCP - General (Family Medicine) Berniece Salines, DO as PCP - Cardiology (Cardiology) Pieter Partridge, DO as Consulting Physician (Neurology) Carollee Herter, Alferd Apa, DO as Consulting Physician (Family Medicine) Robyn Haber, MD (Pulmonary Disease)  Indicate any recent Medical Services you may have received from other than [facility] providers in the past year (date may be approximate).     Assessment:   This is a routine wellness examination for  [place].  Hearing/Vision screen No results found.  Dietary  issues and exercise activities discussed: Current Exercise Habits: The patient does not participate in regular exercise at present, Exercise limited by: orthopedic condition(s)   Goals Addressed   None    Depression Screen    11/15/2021   11:05 AM 09/28/2021   10:48 AM 09/25/2021    1:39 PM 11/09/2020   10:29 AM 09/15/2020   10:17 AM 09/09/2019    9:00 AM 09/08/2018    9:10 AM  PHQ 2/9 Scores  PHQ - 2 Score 4 0 0 0 0 0 1  PHQ- 9 Score 9          Fall Risk    11/15/2021   11:02 AM 09/28/2021   10:48 AM 09/25/2021    1:38 PM 01/05/2021   10:28 AM 11/09/2020   10:27 AM  [place] in the past year? 0 0 0 1 1  Number falls in past yr: 0 0 0 1 1  Injury with Fall? 0 0 0 1 1  Risk for fall due to : History of fall(s) No Fall Risks No Fall Risks;Impaired balance/gait  History of fall(s)  Follow up Falls evaluation completed Falls evaluation completed Falls evaluation completed  Falls prevention discussed    FALL RISK PREVENTION PERTAINING TO THE HOME:  Any stairs in or around the home? No  If so, are there any without handrails?  No stairs Home free of loose throw rugs in walkways, pet beds, electrical cords, etc? Yes  Adequate lighting in your home to reduce risk of falls? Yes   ASSISTIVE DEVICES UTILIZED TO PREVENT FALLS:  Life alert? No  Use of a cane, walker or w/c? Yes  Grab bars in the bathroom? Yes  Shower chair or bench in shower? Yes  Elevated toilet seat or a handicapped toilet? No   TIMED UP AND GO:  Was the test performed?  No, audio visit .   Cognitive Function:    06/11/2016   10:46 AM  MMSE - Mini Mental State Exam  Orientation to time 5  Orientation to Place 5  Registration 3  Attention/ Calculation 5  Recall 3  Language- name 2 objects 2  Language- repeat 1  Language- follow 3 step command 3  Language- read & follow direction 1  Write a sentence 1  Copy design 1  Total score 30         11/15/2021   11:11 AM 11/09/2020   10:39 AM  6CIT Screen  What Year? 0 points 0 points  What month? 0 points 0 points  What time? 0 points 0 points  Count back from 20 0 points 0 points  Months in reverse 0 points 0 points  Repeat phrase 0 points 0 points  Total Score 0 points 0 points    Immunizations Immunization History  Administered Date(s) Administered   Fluad Quad(high Dose 65+) 09/22/2018, 09/23/2019, 11/10/2020, 11/13/2021   Influenza-Unspecified 11/14/2014   PFIZER(Purple Top)SARS-COV-2 Vaccination 03/21/2019, 04/10/2019, 10/20/2019   Pfizer Covid-19 Vaccine Bivalent Booster 34yr & up 12/18/2020   Pneumococcal Conjugate-13 08/29/2017   Pneumococcal Polysaccharide-23 02/15/2014, 09/22/2018   Respiratory Syncytial Virus Vaccine,Recomb Aduvanted(Arexvy) 10/09/2021   Tdap 08/29/2010, 01/14/2020   Zoster, Live 10/14/2012    TDAP status: Up to date  Flu Vaccine status: Up to date  Pneumococcal vaccine status: Up to date  Covid-19 vaccine status: Information provided on how to obtain vaccines.   Qualifies for Shingles Vaccine? Yes   Zostavax completed Yes   Shingrix Completed?: No.  Education has been provided regarding the importance of this vaccine. Patient has been advised to call insurance company to determine out of pocket expense if they have not yet received this vaccine. Advised may also receive vaccine at local pharmacy or Health Dept. Verbalized acceptance and understanding.  Screening Tests Health Maintenance  Topic Date Due   COVID-19 Vaccine (5 - Pfizer risk series) 02/12/2021   Lung Cancer Screening  10/20/2021   Medicare Annual Wellness (AWV)  11/09/2021   Zoster Vaccines- Shingrix (1 of 2) 02/13/2022 (Originally 06/05/1969)   MAMMOGRAM  04/20/2022   COLONOSCOPY (Pts 45-24yr Insurance coverage will need to be confirmed)  08/31/2024   TETANUS/TDAP  01/13/2030   Pneumonia Vaccine 70 Years old  Completed   INFLUENZA VACCINE  Completed   DEXA  SCAN  Completed   Hepatitis C Screening  Completed   HPV VACCINES  Aged Out    Health Maintenance  Health Maintenance Due  Topic Date Due   COVID-19 Vaccine (5 - Pfizer risk series) 02/12/2021   Lung Cancer Screening  10/20/2021   Medicare Annual Wellness (AWV)  11/09/2021    Colorectal cancer screening: Type of screening: Colonoscopy. Completed 09/01/19. Repeat every 5 years  Mammogram status: Completed 04/19/21. Repeat every year  Bone Density status: Completed 04/19/21. Results reflect: Bone density results: OSTEOPOROSIS. Repeat every 2 years.  Lung Cancer Screening: (Low Dose CT Chest recommended if Age 36102-80years, 30 pack-year currently smoking OR have quit w/in 15years.) does qualify.   Lung Cancer Screening Referral: ordered on 09/20/21  Additional Screening:  Hepatitis C Screening: does qualify; Completed 06/11/16  Vision Screening: Recommended annual ophthalmology exams for early detection of glaucoma and other disorders of the eye. Is the patient up to date with their annual eye exam?  Yes  Who is the provider or what is the name of the office in which the patient attends annual eye exams? [facility] If pt is not established with a provider, would they like to be referred to a provider to establish care? No .   Dental Screening: Recommended annual dental exams for proper oral hygiene  Community Resource Referral / Chronic Care Management: CRR required this visit?  No   CCM required this visit?  No      Plan:     I have personally reviewed and noted the following in the patient's chart:   Medical and social history Use of alcohol, tobacco or illicit drugs  Current medications and supplements including opioid prescriptions. Patient is not currently taking opioid prescriptions. Functional ability and status Nutritional status Physical activity Advanced directives List of other physicians Hospitalizations, surgeries, and ER visits in previous 12  months Vitals Screenings to include cognitive, depression, and falls Referrals and appointments  In addition, I have reviewed and discussed with patient certain preventive protocols, quality metrics, and best practice recommendations. A written personalized care plan for preventive services as well as general preventive health recommendations were provided to patient.   Due to this being a telephonic visit, the after visit summary with patients personalized plan was offered to patient via mail or my-chart.  Patient would like to access on my-chart.  BBeatris Ship [place]  11/15/2021   Nurse Notes: None

## 2021-11-18 ENCOUNTER — Encounter: Payer: Self-pay | Admitting: Family Medicine

## 2021-11-18 ENCOUNTER — Other Ambulatory Visit: Payer: Self-pay | Admitting: Family Medicine

## 2021-11-18 DIAGNOSIS — D72829 Elevated white blood cell count, unspecified: Secondary | ICD-10-CM

## 2021-11-20 ENCOUNTER — Encounter (INDEPENDENT_AMBULATORY_CARE_PROVIDER_SITE_OTHER): Payer: Self-pay

## 2021-11-22 ENCOUNTER — Encounter: Payer: Self-pay | Admitting: Family Medicine

## 2021-11-22 ENCOUNTER — Telehealth (INDEPENDENT_AMBULATORY_CARE_PROVIDER_SITE_OTHER): Payer: Medicare HMO | Admitting: Family Medicine

## 2021-11-22 DIAGNOSIS — J069 Acute upper respiratory infection, unspecified: Secondary | ICD-10-CM

## 2021-11-22 MED ORDER — AZELASTINE HCL 0.1 % NA SOLN: 2.0000 | Freq: Two times a day (BID) | NASAL | 1 refills | Status: DC

## 2021-11-22 NOTE — Patient Instructions (Signed)
So sorry you are not feeling well. Unfortunately there are a lot of viruses going around. Since this is not an in-person visit, we do not have a way to do a respiratory swab for you (you could always scheduled an in-person appointment or see if a local pharmacy could do a flu/COVID test). It is too early to start with antibiotics right away. Typically viruses will resolve with supportive measures in 7-10 days. If not making progress by then, or if severely worse in the next few days, please let us know and we can try an antibiotic. For now, continue your antihistamine, Flonase, and start the Astelin nose spray I am sending in. We discussed oral prednisone as a potential option, but you declined at this time. Continue supportive measures including rest, hydration, humidifier use, steam showers, warm compresses to sinuses, warm liquids with lemon and honey, and over-the-counter cough, cold, and analgesics as needed.

## 2021-11-22 NOTE — Progress Notes (Signed)
Virtual Video Visit via MyChart Note  I connected with  Jasmine Mclaughlin on 11/22/21 at  9:20 AM EST by the video enabled telemedicine application for MyChart, and verified that I am speaking with the correct person using two identifiers.   I introduced myself as a Designer, jewellery with the practice. We discussed the limitations of evaluation and management by telemedicine and the availability of in person appointments. The patient expressed understanding and agreed to proceed.  Participating parties in this visit include: The patient and the nurse practitioner listed.  The patient is: At home I am: In the office - [facility]:    CC:  Chief Complaint  Patient presents with   Sinus Problem    HPI: Jasmine Mclaughlin is a 71 y.o. year old female presenting today via [place] today for sinus pressure, nasal congestion.  Patient reports that she started developing nasal congestion and sinus pressure (primarily on the right side) yesterday. Reports discomfort can be up to 7/10 and she did have a fever of 101F yesterday, but has not had any fever today. Additional symptoms include sneezing, right ear pressure, and occasional nose bleed. She denies any cough, headaches, sore throat, dyspnea, chest pain, weakness, fatigue, body aches. No known sick contacts, but she has been out and about in the community while wearing a mask. Reports she has had Flu, COVID, and RSV vaccines.   Past medical history, Surgical history, Family history not pertinant except as noted below, Social history, Allergies, and medications have been entered into the medical record, reviewed, and corrections made.   Review of Systems:  All review of systems negative except what is listed in the HPI   Objective:    General:  Speaking clearly in complete sentences. Absent shortness of breath noted.   Alert and oriented x3.   Normal judgment.  Absent acute distress.   Impression and  Recommendations:    1. Viral upper respiratory tract infection So sorry you are not feeling well. Unfortunately there are a lot of viruses going around. Since this is not an in-person visit, we do not have a way to do a respiratory swab for you (you could always scheduled an in-person appointment or see if a local pharmacy could do a flu/COVID test). It is too early to start with antibiotics right away. Typically viruses will resolve with supportive measures in 7-10 days. If not making progress by then, or if severely worse in the next few days, please let us know and we can try an antibiotic. For now, continue your antihistamine, Flonase, and start the Astelin nose spray I am sending in. We discussed oral prednisone as a potential option, but you declined at this time. Continue supportive measures including rest, hydration, humidifier use, steam showers, warm compresses to sinuses, warm liquids with lemon and honey, and over-the-counter cough, cold, and analgesics as needed.    Patient aware of signs/symptoms requiring further/urgent evaluation.  - azelastine (ASTELIN) 0.1 % nasal spray; Place 2 sprays into both nostrils 2 (two) times daily. Use in each nostril as directed  Dispense: 301 mL; Refill: 1   Follow-up if symptoms worsen or fail to improve.    I discussed the assessment and treatment plan with the patient. The patient was provided an opportunity to ask questions and all were answered. The patient agreed with the plan and demonstrated an understanding of the instructions.   The patient was advised to call back or seek an  in-person evaluation if the symptoms worsen or if the condition fails to improve as anticipated.  I spent 20 minutes dedicated to the care of this patient on the date of this encounter to include pre-visit chart review of prior notes and results, face-to-face time with the patient, and post-visit ordering of testing as indicated.   Terrilyn Saver, NP

## 2021-12-07 DIAGNOSIS — I110 Hypertensive heart disease with heart failure: Secondary | ICD-10-CM | POA: Diagnosis not present

## 2021-12-07 DIAGNOSIS — R0683 Snoring: Secondary | ICD-10-CM | POA: Diagnosis not present

## 2021-12-07 DIAGNOSIS — Z79899 Other long term (current) drug therapy: Secondary | ICD-10-CM | POA: Diagnosis not present

## 2021-12-07 DIAGNOSIS — I25119 Atherosclerotic heart disease of native coronary artery with unspecified angina pectoris: Secondary | ICD-10-CM | POA: Diagnosis not present

## 2021-12-07 DIAGNOSIS — I517 Cardiomegaly: Secondary | ICD-10-CM | POA: Diagnosis not present

## 2021-12-18 DIAGNOSIS — L821 Other seborrheic keratosis: Secondary | ICD-10-CM | POA: Diagnosis not present

## 2021-12-18 DIAGNOSIS — L814 Other melanin hyperpigmentation: Secondary | ICD-10-CM | POA: Diagnosis not present

## 2021-12-18 DIAGNOSIS — L918 Other hypertrophic disorders of the skin: Secondary | ICD-10-CM | POA: Diagnosis not present

## 2021-12-18 DIAGNOSIS — D2261 Melanocytic nevi of right upper limb, including shoulder: Secondary | ICD-10-CM | POA: Diagnosis not present

## 2021-12-18 DIAGNOSIS — D235 Other benign neoplasm of skin of trunk: Secondary | ICD-10-CM | POA: Diagnosis not present

## 2021-12-18 DIAGNOSIS — L820 Inflamed seborrheic keratosis: Secondary | ICD-10-CM | POA: Diagnosis not present

## 2021-12-18 DIAGNOSIS — L579 Skin changes due to chronic exposure to nonionizing radiation, unspecified: Secondary | ICD-10-CM | POA: Diagnosis not present

## 2021-12-20 ENCOUNTER — Encounter: Payer: Self-pay | Admitting: Family Medicine

## 2021-12-20 ENCOUNTER — Other Ambulatory Visit: Payer: Self-pay | Admitting: Family Medicine

## 2021-12-20 DIAGNOSIS — M810 Age-related osteoporosis without current pathological fracture: Secondary | ICD-10-CM

## 2021-12-20 MED ORDER — ALENDRONATE SODIUM 70 MG PO TABS: 70.0000 mg | ORAL_TABLET | ORAL | 3 refills | Status: DC

## 2021-12-28 DIAGNOSIS — R69 Illness, unspecified: Secondary | ICD-10-CM | POA: Diagnosis not present

## 2021-12-28 DIAGNOSIS — R918 Other nonspecific abnormal finding of lung field: Secondary | ICD-10-CM | POA: Diagnosis not present

## 2021-12-28 DIAGNOSIS — J4550 Severe persistent asthma, uncomplicated: Secondary | ICD-10-CM | POA: Diagnosis not present

## 2022-01-03 ENCOUNTER — Encounter: Payer: Self-pay | Admitting: Family Medicine

## 2022-01-04 NOTE — Progress Notes (Deleted)
NEUROLOGY FOLLOW UP OFFICE NOTE  Jasmine Mclaughlin [id number]  Assessment/Plan:   1.  Unsteady gait with frequent falls secondary to residual lumbar radiculopathy/chronic pain 2.  Lumbar degenerative disc disease with chronic low back pain with bilateral radiculopathy - primarily involving right L5-S1.  1.  Gabapentin '300mg'$  at bedtime *** 2.  Follow up with her orthopedist.  Perhaps she may benefit from right L5-S1 ESI. 3.  Regarding vertigo, recommended vestibular rehab..  She defers for now.   4.  Otherwise, follow up 1 year   Subjective:  Jasmine Mclaughlin. Mesch is a 71 year old left-handed female with asthma, HTN, arthritis, osteoporosis, anxiety who follows up for frequent falls.  UPDATE: Current medications:  gabapentin '300mg'$  [facility]  Orthopedist ***    HISTORY: Patient underwent lumbar fusion several years ago.  She had some tingling in the feet afterwards that resolved but then returned a couple of years later.  For years her toes tingled.  Over the past couple of years, she reports shooting pain in her toes of both feet.  When she is sitting, she finds herself constantly wiggling her toes.  In bed, she feels aching in her legs below the knees.  She tries moving her legs in bed which is ineffective and she has to get up and walk around at night.  She sometimes has shooting pain down the side of both legs.  She continues to have chronic back pain.  She returned to her orthopedist who performed a X-ray and was told she had degenerative disc disease of the lumbar spine.  She also has osteoarthritis and hip pain.  She has history of right knee surgery.  She reports decreased feeling in her feet and doesn't feel stable when standing.  She has had multiple falls over the past year.  Sometimes she feels slight vertigo for a few seconds when she first gets up but dizziness is not the cause of her falls.  She just may fall over, especially if she has to bend over to pick something off of the floor.  She did go  to physical therapy which didn't provide any lasting improvement in pain or balance.  She does not have diabetes.    LABS:  B12 and TSH from September 2021 were 299 and 0.95 respectively.    04/18/2020 NCV-EMG:  1. Chronic L5 radiculopathy affecting the right lower extremity, mild.  2.  There is no evidence of a sensorimotor polyneuropathy affecting the lower extremities.  04/05/2020 MRI L-SPINE WO:  Sequela of L2-L5 posterior spinal fusion. Associated susceptibility artifact limits evaluation.  Mild L1-2 spinal canal narrowing.  Mild left L3-4, L4-5, L5-S1 neural foraminal narrowing (spinal canal and right neural foramen at L5-S1 not well visualized).  No discrete fracture. Mild L1 bone marrow edema, likely degenerative related versus contusion. 05/04/2020 MRI BRAIN W WO:  No acute abnormality.  Chronic microvascular ischemic change in the white matter, basal ganglia, and pons of a moderate to advanced degree. 07/19/2020 MRI C-SPINE WO:  Multilevel degenerative changes cervical spine with spinal and foraminal stenosis at multiple levels. No cord compression or fracture.  Mild spinal stenosis with mild cord flattening seen at C4-5 level but nothing significant.  PAST MEDICAL HISTORY: Past Medical History:  Diagnosis Date   Allergic rhinitis 01/24/2015   Asthma    Atypical chest pain 08/25/2019   Chest tightness 10/27/2013   COPD (chronic obstructive pulmonary disease)    Degenerative disc disease, lumbar 10/14/2012   Generalized anxiety disorder 11/13/2012  Genital warts    History of COVID-19 08/2020   Hyperlipidemia 08/08/2020   Major depressive disorder    Obesity (BMI 30-39.9) 10/14/2012   Osteoarthritis 09/28/2010   Osteoporosis    Palpitations 11/28/2020   Pneumonia    Primary hypertension 12/23/2019   Shortness of breath    Sinusitis, acute 10/27/2013   Tobacco abuse 09/28/2010   Tubular adenoma of colon 11/18/2017   Vitamin D deficiency 12/23/2019   Wheezing 10/27/2013     MEDICATIONS: Current Outpatient Medications on File Prior to Visit  Medication Sig Dispense Refill   acetaminophen (TYLENOL) 650 MG CR tablet Take 650 mg by mouth 2 times daily at 12 noon and 4 pm. Patient takes 2 tablets twice a day     albuterol (PROVENTIL) (2.5 MG/3ML) 0.083% nebulizer solution Take 3 mLs (2.5 mg total) by nebulization every 4 (four) hours as needed for wheezing or shortness of breath. 150 mL 1   albuterol (VENTOLIN HFA) 108 (90 Base) MCG/ACT inhaler Inhale 2 puffs into the lungs every 6 (six) hours as needed for wheezing or shortness of breath. 1 each 5   alendronate (FOSAMAX) 70 MG tablet Take 1 tablet (70 mg total) by mouth every 7 (seven) days. Take with a full glass of water on an empty stomach. 12 tablet 3   aspirin EC 81 MG tablet Take 81 mg by mouth daily. Swallow whole.     azelastine (ASTELIN) 0.1 % nasal spray Place 2 sprays into both nostrils 2 (two) times daily. Use in each nostril as directed 301 mL 1   calcium-vitamin D (OSCAL WITH D) 500-5 MG-MCG tablet Take 1 tablet by mouth.     chlorpheniramine (CHLOR-TRIMETON) 4 MG tablet Take 4 mg by mouth daily as needed for allergies.     citalopram (CELEXA) 40 MG tablet Take 1 tablet (40 mg total) by mouth daily. 90 tablet 1   co-enzyme Q-10 30 MG capsule Take 30 mg by mouth 3 (three) times daily.     diltiazem (CARDIZEM CD) 180 MG 24 hr capsule Take 1 capsule (180 mg total) by mouth daily. 90 capsule 3   diphenhydrAMINE (SOMINEX) 25 MG tablet Take 25 mg by mouth at bedtime.     fluticasone (FLONASE) 50 MCG/ACT nasal spray Place 2 sprays into both nostrils daily. 16 g 6   gabapentin (NEURONTIN) 300 MG capsule TAKE 1 CAPSULE BY MOUTH EVERYDAY AT BEDTIME 90 capsule 1   hydrochlorothiazide (MICROZIDE) 12.5 MG capsule Take 1 capsule (12.5 mg total) by mouth daily. 90 capsule 3   ipratropium (ATROVENT) 0.02 % nebulizer solution Take 2.5 mLs (0.5 mg total) by nebulization 4 (four) times daily. 75 mL 12   isosorbide  mononitrate (IMDUR) 30 MG 24 hr tablet Take 0.5 tablets (15 mg total) by mouth daily. 45 tablet 1   montelukast (SINGULAIR) 10 MG tablet Take 1 tablet (10 mg total) by mouth at bedtime. 30 tablet 3   nitroGLYCERIN (NITROSTAT) 0.4 MG SL tablet Place 1 tablet (0.4 mg total) under the tongue every 5 (five) minutes as needed for chest pain. 90 tablet 3   simvastatin (ZOCOR) 20 MG tablet Take 1 tablet (20 mg total) by mouth at bedtime. 90 tablet 1   tiZANidine (ZANAFLEX) 4 MG capsule Take 1 capsule (4 mg total) by mouth 3 (three) times daily as needed for muscle spasms. 30 capsule 2   vitamin B-12 (CYANOCOBALAMIN) 1000 MCG tablet Take 1,000 mcg by mouth daily.     No current facility-administered medications on file  prior to visit.    ALLERGIES: No Known Allergies  FAMILY HISTORY: Family History  Problem Relation Age of Onset   Arthritis Mother    Atrial fibrillation Mother    Arthritis Father    Stroke Father    Colon cancer Father 68   Heart disease Father 79       chf   Breast cancer Maternal Grandmother    Asthma Maternal Grandmother    Asthma Daughter    Esophageal cancer Neg Hx    Ulcerative colitis Neg Hx    Stomach cancer Neg Hx       Objective:  Blood pressure 127/70, pulse 82, height '5\' 5"'$  (1.651 m), weight 226 lb 12.8 oz (102.9 kg), SpO2 95 %. General: No acute distress.  Patient appears well-groomed.   Head:  Normocephalic/atraumatic Eyes:  Fundi examined but not visualized Neck: supple, no paraspinal tenderness, full range of motion Heart:  Regular rate and rhythm Lungs:  Clear to auscultation bilaterally Back: No paraspinal tenderness Neurological Exam: alert and oriented to person, place, and time.  Speech fluent and not dysarthric, language intact.  CN II-XII intact. Bulk and tone normal, muscle strength 5/5 throughout.  Sensation to pinprick and vibration reduced in toes up to mid-shins.  Deep tendon reflexes 2+ throughout except absent in right patellar and both  ankles ***.  Hoffman sign absent.  Babinski sign absent.  Finger to nose testing intact.  Broad-based gait, lateral trunk bend to right, able to turn.  Romberg negative.    Metta Clines, DO  CC: Roma Schanz, DO

## 2022-01-08 ENCOUNTER — Ambulatory Visit: Payer: Medicare HMO | Admitting: Neurology

## 2022-01-14 ENCOUNTER — Ambulatory Visit: Payer: Medicare HMO | Admitting: Neurology

## 2022-01-18 ENCOUNTER — Encounter: Payer: Self-pay | Admitting: Family Medicine

## 2022-01-23 ENCOUNTER — Other Ambulatory Visit (INDEPENDENT_AMBULATORY_CARE_PROVIDER_SITE_OTHER): Payer: Medicare HMO

## 2022-01-23 DIAGNOSIS — D72829 Elevated white blood cell count, unspecified: Secondary | ICD-10-CM

## 2022-01-23 LAB — CBC WITH DIFFERENTIAL/PLATELET
Basophils Absolute: 0.1 10*3/uL (ref 0.0–0.1)
Basophils Relative: 0.7 % (ref 0.0–3.0)
Eosinophils Absolute: 0.2 10*3/uL (ref 0.0–0.7)
Eosinophils Relative: 2.3 % (ref 0.0–5.0)
HCT: 37.4 % (ref 36.0–46.0)
Hemoglobin: 12.6 g/dL (ref 12.0–15.0)
Lymphocytes Relative: 29.1 % (ref 12.0–46.0)
Lymphs Abs: 2.5 10*3/uL (ref 0.7–4.0)
MCHC: 33.6 g/dL (ref 30.0–36.0)
MCV: 98.4 fl (ref 78.0–100.0)
Monocytes Absolute: 0.7 10*3/uL (ref 0.1–1.0)
Monocytes Relative: 8.1 % (ref 3.0–12.0)
Neutro Abs: 5.2 10*3/uL (ref 1.4–7.7)
Neutrophils Relative %: 59.8 % (ref 43.0–77.0)
Platelets: 354 10*3/uL (ref 150.0–400.0)
RBC: 3.81 Mil/uL — ABNORMAL LOW (ref 3.87–5.11)
RDW: 13.5 % (ref 11.5–15.5)
WBC: 8.7 10*3/uL (ref 4.0–10.5)

## 2022-01-24 DIAGNOSIS — I517 Cardiomegaly: Secondary | ICD-10-CM | POA: Diagnosis not present

## 2022-01-25 ENCOUNTER — Other Ambulatory Visit: Payer: Self-pay | Admitting: Cardiology

## 2022-01-29 ENCOUNTER — Telehealth (INDEPENDENT_AMBULATORY_CARE_PROVIDER_SITE_OTHER): Payer: Medicare HMO | Admitting: Family Medicine

## 2022-01-29 ENCOUNTER — Encounter: Payer: Self-pay | Admitting: Family Medicine

## 2022-01-29 VITALS — BP 122/78 | HR 74 | Temp 97.7°F

## 2022-01-29 DIAGNOSIS — J0140 Acute pansinusitis, unspecified: Secondary | ICD-10-CM | POA: Diagnosis not present

## 2022-01-29 MED ORDER — AMOXICILLIN-POT CLAVULANATE 875-125 MG PO TABS: 1.0000 | ORAL_TABLET | Freq: Two times a day (BID) | ORAL | 0 refills | Status: DC

## 2022-01-29 NOTE — Progress Notes (Signed)
MyChart Video Visit    Virtual Visit via Video Note   This visit type was conducted due to national recommendations for restrictions regarding the COVID-19 Pandemic (e.g. social distancing) in an effort to limit this patient's exposure and mitigate transmission in our community. This patient is at least at moderate risk for complications without adequate follow up. This format is felt to be most appropriate for this patient at this time. Physical exam was limited by quality of the video and audio technology used for the visit. Jasmine Mclaughlin  was able to get the patient set up on a video visit.  Patient location: Home; Patient, provider, and medical scribe in visit Provider location: Office  I discussed the limitations of evaluation and management by telemedicine and the availability of in person appointments. The patient expressed understanding and agreed to proceed.  Visit Date: 01/29/22   Today's healthcare provider: Ann Held, DO     Subjective:    Patient ID: Jasmine Mclaughlin, female    DOB: [birth year], 72 y.o.   MRN: [id number]  Chief Complaint  Patient presents with   Nasal Congestion   Otalgia    Both ears     Sore Throat    Otalgia  Associated symptoms include coughing. Pertinent negatives include no abdominal pain, headaches or rash.  Sore Throat  Associated symptoms include congestion and coughing. Pertinent negatives include no abdominal pain, headaches or shortness of breath.   Patient is in today for a virtual visit.  Cold Symptoms She started feeling ill on Sunday. She is experiencing coughing, congestion, nosebleeds upon blowing her nose, sinus pain, and fatigue. Her eyes are runny and difficult to open. She is using Flonase and Asthalin to manage her symptoms.    Past Medical History:  Diagnosis Date   Allergic rhinitis 01/24/2015   Asthma    Atypical chest pain 08/25/2019   Chest tightness 10/27/2013   COPD (chronic obstructive pulmonary  disease)    Degenerative disc disease, lumbar 10/14/2012   Generalized anxiety disorder 11/13/2012   Genital warts    History of COVID-19 08/2020   Hyperlipidemia 08/08/2020   Major depressive disorder    Obesity (BMI 30-39.9) 10/14/2012   Osteoarthritis 09/28/2010   Osteoporosis    Palpitations 11/28/2020   Pneumonia    Primary hypertension 12/23/2019   Shortness of breath    Sinusitis, acute 10/27/2013   Tobacco abuse 09/28/2010   Tubular adenoma of Jasmine 11/18/2017   Vitamin D deficiency 12/23/2019   Wheezing 10/27/2013    Past Surgical History:  Procedure Laterality Date   ANTERIOR FUSION LUMBAR SPINE  05/06/2012   ANTERIOR LAT LUMBAR FUSION N/A 05/06/2012   Procedure: ANTERIOR LATERAL LUMBAR FUSION 3 LEVELS;  Surgeon: Sinclair Ship, MD;  Location: [place];  Service: Orthopedics;  Laterality: N/A;  Lateral interbody fusion, lumbar 2-3,lumbar 3-4, lumbar 4-5 with instrumentation and allograft.   COLONOSCOPY     COLONOSCOPY WITH PROPOFOL N/A 12/15/2017   Procedure: COLONOSCOPY WITH PROPOFOL;  Surgeon: Rush Landmark Telford Nab., MD;  Location: [place];  Service: Gastroenterology;  Laterality: N/A;   COLONOSCOPY WITH PROPOFOL N/A 07/08/2018   Procedure: COLONOSCOPY WITH PROPOFOL;  Surgeon: Rush Landmark Telford Nab., MD;  Location: [place];  Service: Gastroenterology;  Laterality: N/A;   COLONOSCOPY WITH PROPOFOL N/A 09/01/2019   Procedure: COLONOSCOPY WITH PROPOFOL;  Surgeon: Rush Landmark Telford Nab., MD;  Location: [facility];  Service: Gastroenterology;  Laterality: N/A;   KNEE ARTHROSCOPY Right     R knee 01/2012, for  ligament tears   POLYPECTOMY  07/08/2018   Procedure: POLYPECTOMY;  Surgeon: Mansouraty, Telford Nab., MD;  Location: [facility];  Service: Gastroenterology;;   POLYPECTOMY  09/01/2019   Procedure: POLYPECTOMY;  Surgeon: Irving Copas., MD;  Location: [facility];  Service: Gastroenterology;;   TONSILLECTOMY     VAGINAL DELIVERY     x1     Family History  Problem Relation Age of Onset   Arthritis Mother    Atrial fibrillation Mother    Arthritis Father    Stroke Father    Jasmine cancer Father 71   Heart disease Father 32       chf   Breast cancer Maternal Grandmother    Asthma Maternal Grandmother    Asthma Daughter    Esophageal cancer Neg Hx    Ulcerative colitis Neg Hx    Stomach cancer Neg Hx     Social History   Socioeconomic History   Marital status: Single    Spouse name: Not on file   Number of children: 1   Years of education: 12   Highest education level: High school graduate  Occupational History   Occupation: Retired    Comment: Press photographer  Tobacco Use   Smoking status: Former    Packs/day: 0.50    Years: 55.00    Total pack years: 27.50    Types: Cigarettes    Quit date: 09/19/2021    Years since quitting: 0.3   Smokeless tobacco: Never  Vaping Use   Vaping Use: Never used  Substance and Sexual Activity   Alcohol use: Yes    Alcohol/week: 7.0 - 14.0 standard drinks of alcohol    Types: 7 - 14 Standard drinks or equivalent per week    Comment: at least 1 drink daily, but described alcohol use as "a habit" and that it "gets out of hand" at times   Drug use: No   Sexual activity: Not Currently    Partners: Male  Other Topics Concern   Not on file  Social History Narrative   1 caffeine drinks daily   No exercise   Left handed   One story home   Social Determinants of Health   Financial Resource Strain: Low Risk  (11/09/2020)   Overall Financial Resource Strain (CARDIA)    Difficulty of Paying Living Expenses: Not hard at all  Food Insecurity: No Food Insecurity (11/15/2021)   Hunger Vital Sign    Worried About Running Out of Food in the Last Year: Never true    Ran Out of Food in the Last Year: Never true  Transportation Needs: No Transportation Needs (11/15/2021)   PRAPARE - Hydrologist (Medical): No    Lack of Transportation (Non-Medical): No   Physical Activity: Insufficiently Active (11/09/2020)   Exercise Vital Sign    Days of Exercise per Week: 7 days    Minutes of Exercise per Session: 10 min  Stress: No Stress Concern Present (11/09/2020)   [place]    Feeling of Stress : Not at all  Social Connections: Moderately Isolated (11/15/2021)   Social Connection and Isolation Panel [NHANES]    Frequency of Communication with Friends and Family: Three times a week    Frequency of Social Gatherings with Friends and Family: Never    Attends Religious Services: 1 to 4 times per year    Active Member of [employer] or Organizations: No    Attends Archivist  Meetings: Never    Marital Status: Divorced  Human resources officer Violence: Not At Risk (11/09/2020)   Humiliation, Afraid, Rape, and Kick questionnaire    Fear of Current or Ex-Partner: No    Emotionally Abused: No    Physically Abused: No    Sexually Abused: No    Outpatient Medications Prior to Visit  Medication Sig Dispense Refill   acetaminophen (TYLENOL) 650 MG CR tablet Take 650 mg by mouth 2 times daily at 12 noon and 4 pm. Patient takes 2 tablets twice a day     albuterol (PROVENTIL) (2.5 MG/3ML) 0.083% nebulizer solution Take 3 mLs (2.5 mg total) by nebulization every 4 (four) hours as needed for wheezing or shortness of breath. 150 mL 1   albuterol (VENTOLIN HFA) 108 (90 Base) MCG/ACT inhaler Inhale 2 puffs into the lungs every 6 (six) hours as needed for wheezing or shortness of breath. 1 each 5   alendronate (FOSAMAX) 70 MG tablet Take 1 tablet (70 mg total) by mouth every 7 (seven) days. Take with a full glass of water on an empty stomach. 12 tablet 3   aspirin EC 81 MG tablet Take 81 mg by mouth daily. Swallow whole.     azelastine (ASTELIN) 0.1 % nasal spray Place 2 sprays into both nostrils 2 (two) times daily. Use in each nostril as directed 301 mL 1   calcium-vitamin D (OSCAL WITH D) 500-5  MG-MCG tablet Take 1 tablet by mouth.     chlorpheniramine (CHLOR-TRIMETON) 4 MG tablet Take 4 mg by mouth daily as needed for allergies.     citalopram (CELEXA) 40 MG tablet Take 1 tablet (40 mg total) by mouth daily. 90 tablet 1   co-enzyme Q-10 30 MG capsule Take 30 mg by mouth 3 (three) times daily.     diphenhydrAMINE (SOMINEX) 25 MG tablet Take 25 mg by mouth at bedtime.     fluticasone (FLONASE) 50 MCG/ACT nasal spray Place 2 sprays into both nostrils daily. 16 g 6   gabapentin (NEURONTIN) 300 MG capsule TAKE 1 CAPSULE BY MOUTH EVERYDAY AT BEDTIME 90 capsule 1   hydrochlorothiazide (MICROZIDE) 12.5 MG capsule TAKE 1 CAPSULE BY MOUTH EVERY DAY 60 capsule 0   ipratropium (ATROVENT) 0.02 % nebulizer solution Take 2.5 mLs (0.5 mg total) by nebulization 4 (four) times daily. 75 mL 12   isosorbide mononitrate (IMDUR) 30 MG 24 hr tablet Take 0.5 tablets (15 mg total) by mouth daily. 45 tablet 1   montelukast (SINGULAIR) 10 MG tablet Take 1 tablet (10 mg total) by mouth at bedtime. 30 tablet 3   simvastatin (ZOCOR) 20 MG tablet Take 1 tablet (20 mg total) by mouth at bedtime. 90 tablet 1   tiZANidine (ZANAFLEX) 4 MG capsule Take 1 capsule (4 mg total) by mouth 3 (three) times daily as needed for muscle spasms. 30 capsule 2   vitamin B-12 (CYANOCOBALAMIN) 1000 MCG tablet Take 1,000 mcg by mouth daily.     diltiazem (CARDIZEM CD) 180 MG 24 hr capsule Take 1 capsule (180 mg total) by mouth daily. 90 capsule 3   nitroGLYCERIN (NITROSTAT) 0.4 MG SL tablet Place 1 tablet (0.4 mg total) under the tongue every 5 (five) minutes as needed for chest pain. 90 tablet 3   No facility-administered medications prior to visit.    No Known Allergies  Review of Systems  Constitutional:  Positive for malaise/fatigue. Negative for fever.  HENT:  Positive for congestion, nosebleeds (Upon blowing nose) and sinus pain.   Eyes:  Negative for blurred vision.       (+) Pressure behind eyes (+) Runny eyes   Respiratory:  Positive for cough. Negative for shortness of breath.   Cardiovascular:  Negative for chest pain, palpitations and leg swelling.  Gastrointestinal:  Negative for abdominal pain, blood in stool and nausea.  Genitourinary:  Negative for dysuria and frequency.  Musculoskeletal:  Negative for falls.  Skin:  Negative for rash.  Neurological:  Negative for dizziness, loss of consciousness and headaches.  Endo/Heme/Allergies:  Negative for environmental allergies.  Psychiatric/Behavioral:  Negative for depression. The patient is not nervous/anxious.        Objective:    Physical Exam Vitals and nursing note reviewed.  Constitutional:      General: She is not in acute distress.    Appearance: Normal appearance. She is well-developed. She is not ill-appearing.  HENT:     Head: Normocephalic and atraumatic.     Nose:     Right Sinus: Maxillary sinus tenderness and frontal sinus tenderness present.     Left Sinus: Maxillary sinus tenderness and frontal sinus tenderness present.  Eyes:     General: Lids are normal.  Pulmonary:     Effort: Pulmonary effort is normal.  Musculoskeletal:     Cervical back: Normal range of motion.  Neurological:     Mental Status: She is alert and oriented to person, place, and time.  Psychiatric:        Attention and Perception: Attention and perception normal.        Judgment: Judgment normal.     BP 122/78   Pulse 74 Comment: unable to obtain  Temp 97.7 F (36.5 C) Comment: unable to obtain Wt Readings from Last 3 Encounters:  11/13/21 212 lb 12.8 oz (96.5 kg)  09/28/21 217 lb (98.4 kg)  09/25/21 217 lb 12.8 oz (98.8 kg)    Diabetic Foot Exam - Simple   No data filed    Lab Results  Component Value Date   WBC 8.7 01/23/2022   HGB 12.6 01/23/2022   HCT 37.4 01/23/2022   PLT 354.0 01/23/2022   GLUCOSE 83 11/13/2021   CHOL 119 11/13/2021   TRIG 140.0 11/13/2021   HDL 48.10 11/13/2021   LDLDIRECT 135.0 12/23/2019   LDLCALC  43 11/13/2021   ALT 15 11/13/2021   AST 10 11/13/2021   NA 137 11/13/2021   K 4.3 11/13/2021   CL 98 11/13/2021   CREATININE 0.84 11/13/2021   BUN 27 (H) 11/13/2021   CO2 31 11/13/2021   TSH 0.95 09/23/2019   INR 1.0 11/14/2017    Lab Results  Component Value Date   TSH 0.95 09/23/2019   Lab Results  Component Value Date   WBC 8.7 01/23/2022   HGB 12.6 01/23/2022   HCT 37.4 01/23/2022   MCV 98.4 01/23/2022   PLT 354.0 01/23/2022   Lab Results  Component Value Date   NA 137 11/13/2021   K 4.3 11/13/2021   CO2 31 11/13/2021   GLUCOSE 83 11/13/2021   BUN 27 (H) 11/13/2021   CREATININE 0.84 11/13/2021   BILITOT 0.4 11/13/2021   ALKPHOS 49 11/13/2021   AST 10 11/13/2021   ALT 15 11/13/2021   PROT 6.7 11/13/2021   ALBUMIN 4.3 11/13/2021   CALCIUM 10.2 11/13/2021   EGFR 77 03/13/2020   GFR 70.40 11/13/2021   Lab Results  Component Value Date   CHOL 119 11/13/2021   Lab Results  Component Value Date   HDL 48.10 11/13/2021  Lab Results  Component Value Date   LDLCALC 43 11/13/2021   Lab Results  Component Value Date   TRIG 140.0 11/13/2021   Lab Results  Component Value Date   CHOLHDL 2 11/13/2021   No results found for: "HGBA1C"     Assessment & Plan:   Problem List Items Addressed This Visit       Unprioritized   Acute non-recurrent pansinusitis - Primary   Relevant Medications   amoxicillin-clavulanate (AUGMENTIN) 875-125 MG tablet   Con't flonase and antihistamine   Meds ordered this encounter  Medications   amoxicillin-clavulanate (AUGMENTIN) 875-125 MG tablet    Sig: Take 1 tablet by mouth 2 (two) times daily.    Dispense:  20 tablet    Refill:  0    I discussed the assessment and treatment plan with the patient. The patient was provided an opportunity to ask questions and all were answered. The patient agreed with the plan and demonstrated an understanding of the instructions.   The patient was advised to call back or seek an  in-person evaluation if the symptoms worsen or if the condition fails to improve as anticipated.     I,Alexander Ruley,acting as a Education administrator for [employer], DO.,have documented all relevant documentation on the behalf of Ann Held, DO,as directed by  Ann Held, DO while in the presence of [place], DO, have reviewed all documentation for this visit. The documentation on 01/29/22 for the exam, diagnosis, procedures, and orders are all accurate and complete.    [facility] [phone] (phone) [phone] (fax)  Derma

## 2022-01-31 ENCOUNTER — Other Ambulatory Visit: Payer: Self-pay | Admitting: Neurology

## 2022-02-07 DIAGNOSIS — M533 Sacrococcygeal disorders, not elsewhere classified: Secondary | ICD-10-CM | POA: Diagnosis not present

## 2022-03-05 ENCOUNTER — Other Ambulatory Visit: Payer: Self-pay | Admitting: Family Medicine

## 2022-03-05 DIAGNOSIS — Z1231 Encounter for screening mammogram for malignant neoplasm of breast: Secondary | ICD-10-CM

## 2022-03-06 NOTE — Progress Notes (Signed)
NEUROLOGY FOLLOW UP OFFICE NOTE  Jasmine Mclaughlin [id number]  Assessment/Plan:   1.  Unsteady gait with frequent falls secondary to residual lumbar radiculopathy/chronic pain 2.  Lumbar degenerative disc disease with chronic low back pain with bilateral radiculopathy - primarily involving right L5-S1.   1.  Gabapentin '300mg'$  at bedtime refilled 2.  ***   Subjective:  Jasmine Mclaughlin is a 72 year old left-handed female with asthma, HTN, arthritis, osteoporosis, anxiety who follows up for frequent falls.  UPDATE: Current medications:  gabapentin '300mg'$  QHS  Gabapentin effective.  Still has pain and tingling particularly in right foot.  However, reports cramps in the legs, mostly right foot.  Right foot feels like it goes to sleep.    She had endorsed memory concerns.  She underwent neuropsychological evaluation on 05/08/2021 which demonstrated an isolated weakness across phonemic fluency and very mild performance variability across encoding and retrieval aspects of memory likely related to secondary factors such as anxiety, depression, sleep dysfunction and cerebrovascular disease but not suggestive of an underlying neurodegenerative disease such as Alzheimer's.     HISTORY: Patient underwent lumbar fusion several years ago.  She had some tingling in the feet afterwards that resolved but then returned a couple of years later.  For years her toes tingled.  Over the past couple of years, she reports shooting pain in her toes of both feet.  When she is sitting, she finds herself constantly wiggling her toes.  In bed, she feels aching in her legs below the knees.  She tries moving her legs in bed which is ineffective and she has to get up and walk around at night.  She sometimes has shooting pain down the side of both legs.  She continues to have chronic back pain.  She returned to her orthopedist who performed a X-ray and was told she had degenerative disc disease of the lumbar spine.  She also has  osteoarthritis and hip pain.  She has history of right knee surgery.  She reports decreased feeling in her feet and doesn't feel stable when standing.  She has had multiple falls over the past year.  Sometimes she feels slight vertigo for a few seconds when she first gets up but dizziness is not the cause of her falls.  She just may fall over, especially if she has to bend over to pick something off of the floor.  She did go to physical therapy which didn't provide any lasting improvement in pain or balance.  She does not have diabetes.  B12 and TSH from September 2021 were 299 and 0.95 respectively.  Patient underwent workup for gait instability/frequent falls: 04/18/2020 NCV-EMG:  1. Chronic L5 radiculopathy affecting the right lower extremity, mild.  2.  There is no evidence of a sensorimotor polyneuropathy affecting the lower extremities.  04/05/2020 MRI L-SPINE WO:  Sequela of L2-L5 posterior spinal fusion. Associated susceptibility artifact limits evaluation.  Mild L1-2 spinal canal narrowing.  Mild left L3-4, L4-5, L5-S1 neural foraminal narrowing (spinal canal and right neural foramen at L5-S1 not well visualized).  No discrete fracture. Mild L1 bone marrow edema, likely degenerative related versus contusion. 05/04/2020 MRI BRAIN W WO:  No acute abnormality.  Chronic microvascular ischemic change in the white matter, basal ganglia, and pons of a moderate to advanced degree. 07/19/2020 MRI C-SPINE WO:  Multilevel degenerative changes cervical spine with spinal and foraminal stenosis at multiple levels. No cord compression or fracture.  Mild spinal stenosis with mild cord flattening seen  at C4-5 level but nothing significant.  PAST MEDICAL HISTORY: Past Medical History:  Diagnosis Date   Allergic rhinitis 01/24/2015   Asthma    Atypical chest pain 08/25/2019   Chest tightness 10/27/2013   COPD (chronic obstructive pulmonary disease)    Degenerative disc disease, lumbar 10/14/2012    Generalized anxiety disorder 11/13/2012   Genital warts    History of COVID-19 08/2020   Hyperlipidemia 08/08/2020   Major depressive disorder    Obesity (BMI 30-39.9) 10/14/2012   Osteoarthritis 09/28/2010   Osteoporosis    Palpitations 11/28/2020   Pneumonia    Primary hypertension 12/23/2019   Shortness of breath    Sinusitis, acute 10/27/2013   Tobacco abuse 09/28/2010   Tubular adenoma of colon 11/18/2017   Vitamin D deficiency 12/23/2019   Wheezing 10/27/2013    MEDICATIONS: Current Outpatient Medications on File Prior to Visit  Medication Sig Dispense Refill   acetaminophen (TYLENOL) 650 MG CR tablet Take 650 mg by mouth 2 times daily at 12 noon and 4 pm. Patient takes 2 tablets twice a day     albuterol (PROVENTIL) (2.5 MG/3ML) 0.083% nebulizer solution Take 3 mLs (2.5 mg total) by nebulization every 4 (four) hours as needed for wheezing or shortness of breath. 150 mL 1   albuterol (VENTOLIN HFA) 108 (90 Base) MCG/ACT inhaler Inhale 2 puffs into the lungs every 6 (six) hours as needed for wheezing or shortness of breath. 1 each 5   alendronate (FOSAMAX) 70 MG tablet Take 1 tablet (70 mg total) by mouth every 7 (seven) days. Take with a full glass of water on an empty stomach. 12 tablet 3   amoxicillin-clavulanate (AUGMENTIN) 875-125 MG tablet Take 1 tablet by mouth 2 (two) times daily. 20 tablet 0   aspirin EC 81 MG tablet Take 81 mg by mouth daily. Swallow whole.     azelastine (ASTELIN) 0.1 % nasal spray Place 2 sprays into both nostrils 2 (two) times daily. Use in each nostril as directed 301 mL 1   calcium-vitamin D (OSCAL WITH D) 500-5 MG-MCG tablet Take 1 tablet by mouth.     chlorpheniramine (CHLOR-TRIMETON) 4 MG tablet Take 4 mg by mouth daily as needed for allergies.     citalopram (CELEXA) 40 MG tablet Take 1 tablet (40 mg total) by mouth daily. 90 tablet 1   co-enzyme Q-10 30 MG capsule Take 30 mg by mouth 3 (three) times daily.     diltiazem (CARDIZEM CD) 180 MG  24 hr capsule Take 1 capsule (180 mg total) by mouth daily. 90 capsule 3   diphenhydrAMINE (SOMINEX) 25 MG tablet Take 25 mg by mouth at bedtime.     fluticasone (FLONASE) 50 MCG/ACT nasal spray Place 2 sprays into both nostrils daily. 16 g 6   gabapentin (NEURONTIN) 300 MG capsule TAKE 1 CAPSULE BY MOUTH EVERYDAY AT BEDTIME 90 capsule 0   hydrochlorothiazide (MICROZIDE) 12.5 MG capsule TAKE 1 CAPSULE BY MOUTH EVERY DAY 60 capsule 0   ipratropium (ATROVENT) 0.02 % nebulizer solution Take 2.5 mLs (0.5 mg total) by nebulization 4 (four) times daily. 75 mL 12   isosorbide mononitrate (IMDUR) 30 MG 24 hr tablet Take 0.5 tablets (15 mg total) by mouth daily. 45 tablet 1   montelukast (SINGULAIR) 10 MG tablet Take 1 tablet (10 mg total) by mouth at bedtime. 30 tablet 3   nitroGLYCERIN (NITROSTAT) 0.4 MG SL tablet Place 1 tablet (0.4 mg total) under the tongue every 5 (five) minutes as needed for  chest pain. 90 tablet 3   simvastatin (ZOCOR) 20 MG tablet Take 1 tablet (20 mg total) by mouth at bedtime. 90 tablet 1   tiZANidine (ZANAFLEX) 4 MG capsule Take 1 capsule (4 mg total) by mouth 3 (three) times daily as needed for muscle spasms. 30 capsule 2   vitamin B-12 (CYANOCOBALAMIN) 1000 MCG tablet Take 1,000 mcg by mouth daily.     No current facility-administered medications on file prior to visit.    ALLERGIES: No Known Allergies  FAMILY HISTORY: Family History  Problem Relation Age of Onset   Arthritis Mother    Atrial fibrillation Mother    Arthritis Father    Stroke Father    Colon cancer Father 60   Heart disease Father 53       chf   Breast cancer Maternal Grandmother    Asthma Maternal Grandmother    Asthma Daughter    Esophageal cancer Neg Hx    Ulcerative colitis Neg Hx    Stomach cancer Neg Hx       Objective:  *** General: No acute distress.  Patient appears well-groomed.   Head:  Normocephalic/atraumatic Eyes:  Fundi examined but not visualized Neck: supple, no  paraspinal tenderness, full range of motion Heart:  Regular rate and rhythm Neurological Exam: Alert and oriented.  Speech fluent and not dysarthric.  Language intact.  CN II-XII intact.  Bulk and tone normal.  Muscle strength 5/5 throughout.  Pinprick and vibratory sensation reduced in toes up to mid-shins.  Deep tendon reflexes 2+ except absent in right patellar and both ankles, toes downgoing.  Finger to nose testing intact.  Broad-based gait, lateral trunk bend to right.  Able to turn.  Romberg negative. ***   Metta Clines, DO  CC: Roma Schanz, DO

## 2022-03-07 ENCOUNTER — Encounter: Payer: Self-pay | Admitting: Neurology

## 2022-03-07 ENCOUNTER — Ambulatory Visit: Payer: Medicare HMO | Admitting: Neurology

## 2022-03-07 VITALS — BP 120/69 | HR 68 | Ht 62.0 in | Wt 220.2 lb

## 2022-03-07 DIAGNOSIS — M4726 Other spondylosis with radiculopathy, lumbar region: Secondary | ICD-10-CM | POA: Diagnosis not present

## 2022-03-07 DIAGNOSIS — M5416 Radiculopathy, lumbar region: Secondary | ICD-10-CM

## 2022-03-07 MED ORDER — GABAPENTIN 100 MG PO CAPS: 100.0000 mg | ORAL_CAPSULE | Freq: Every morning | ORAL | 5 refills | Status: DC

## 2022-03-07 NOTE — Patient Instructions (Signed)
Continue gabapentin '300mg'$  at bedtime Start gabapentin '100mg'$  in the morning.  Give me update in 2 weeks and we can adjust dose if needed

## 2022-03-11 ENCOUNTER — Encounter: Payer: Self-pay | Admitting: Family Medicine

## 2022-03-11 DIAGNOSIS — M62838 Other muscle spasm: Secondary | ICD-10-CM

## 2022-03-11 MED ORDER — TIZANIDINE HCL 4 MG PO CAPS: 4.0000 mg | ORAL_CAPSULE | Freq: Three times a day (TID) | ORAL | 2 refills | Status: DC | PRN

## 2022-03-20 ENCOUNTER — Encounter: Payer: Self-pay | Admitting: Neurology

## 2022-04-01 ENCOUNTER — Other Ambulatory Visit: Payer: Self-pay | Admitting: Neurology

## 2022-04-12 DIAGNOSIS — M25519 Pain in unspecified shoulder: Secondary | ICD-10-CM | POA: Diagnosis not present

## 2022-04-12 DIAGNOSIS — I639 Cerebral infarction, unspecified: Secondary | ICD-10-CM | POA: Diagnosis not present

## 2022-04-12 DIAGNOSIS — R2689 Other abnormalities of gait and mobility: Secondary | ICD-10-CM | POA: Diagnosis not present

## 2022-04-12 DIAGNOSIS — R110 Nausea: Secondary | ICD-10-CM | POA: Diagnosis not present

## 2022-04-12 DIAGNOSIS — R0602 Shortness of breath: Secondary | ICD-10-CM | POA: Diagnosis not present

## 2022-04-12 DIAGNOSIS — R531 Weakness: Secondary | ICD-10-CM | POA: Diagnosis not present

## 2022-04-12 DIAGNOSIS — M25512 Pain in left shoulder: Secondary | ICD-10-CM | POA: Diagnosis not present

## 2022-04-12 DIAGNOSIS — R079 Chest pain, unspecified: Secondary | ICD-10-CM | POA: Diagnosis not present

## 2022-04-12 DIAGNOSIS — R42 Dizziness and giddiness: Secondary | ICD-10-CM | POA: Diagnosis not present

## 2022-04-12 DIAGNOSIS — Z8679 Personal history of other diseases of the circulatory system: Secondary | ICD-10-CM | POA: Diagnosis not present

## 2022-04-12 DIAGNOSIS — Z87891 Personal history of nicotine dependence: Secondary | ICD-10-CM | POA: Diagnosis not present

## 2022-04-12 DIAGNOSIS — R61 Generalized hyperhidrosis: Secondary | ICD-10-CM | POA: Diagnosis not present

## 2022-04-12 DIAGNOSIS — Z743 Need for continuous supervision: Secondary | ICD-10-CM | POA: Diagnosis not present

## 2022-04-16 DIAGNOSIS — R001 Bradycardia, unspecified: Secondary | ICD-10-CM | POA: Diagnosis not present

## 2022-04-16 DIAGNOSIS — R079 Chest pain, unspecified: Secondary | ICD-10-CM | POA: Diagnosis not present

## 2022-04-23 ENCOUNTER — Ambulatory Visit
Admission: RE | Admit: 2022-04-23 | Discharge: 2022-04-23 | Disposition: A | Discharge status: Home / Self Care | Payer: Medicare HMO | Source: Ambulatory Visit | Attending: Family Medicine | Admitting: Family Medicine

## 2022-04-23 DIAGNOSIS — Z1231 Encounter for screening mammogram for malignant neoplasm of breast: Secondary | ICD-10-CM

## 2022-04-24 ENCOUNTER — Other Ambulatory Visit: Payer: Self-pay | Admitting: Cardiology

## 2022-04-24 ENCOUNTER — Other Ambulatory Visit: Payer: Self-pay | Admitting: Family Medicine

## 2022-04-24 DIAGNOSIS — F418 Other specified anxiety disorders: Secondary | ICD-10-CM

## 2022-04-24 DIAGNOSIS — J0140 Acute pansinusitis, unspecified: Secondary | ICD-10-CM

## 2022-04-25 ENCOUNTER — Encounter: Payer: Self-pay | Admitting: Family Medicine

## 2022-04-25 DIAGNOSIS — N644 Mastodynia: Secondary | ICD-10-CM

## 2022-04-26 DIAGNOSIS — R42 Dizziness and giddiness: Secondary | ICD-10-CM | POA: Diagnosis not present

## 2022-05-13 DIAGNOSIS — M533 Sacrococcygeal disorders, not elsewhere classified: Secondary | ICD-10-CM | POA: Diagnosis not present

## 2022-05-20 ENCOUNTER — Encounter: Payer: Self-pay | Admitting: Family Medicine

## 2022-05-20 DIAGNOSIS — M62838 Other muscle spasm: Secondary | ICD-10-CM

## 2022-05-20 MED ORDER — TIZANIDINE HCL 4 MG PO CAPS
4.0000 mg | ORAL_CAPSULE | Freq: Three times a day (TID) | ORAL | 2 refills | Status: DC | PRN
Start: 2022-05-20 — End: 2022-08-06

## 2022-05-24 DIAGNOSIS — G4700 Insomnia, unspecified: Secondary | ICD-10-CM | POA: Diagnosis not present

## 2022-05-24 DIAGNOSIS — G4733 Obstructive sleep apnea (adult) (pediatric): Secondary | ICD-10-CM | POA: Diagnosis not present

## 2022-05-30 DIAGNOSIS — G4700 Insomnia, unspecified: Secondary | ICD-10-CM | POA: Diagnosis not present

## 2022-05-30 DIAGNOSIS — G4733 Obstructive sleep apnea (adult) (pediatric): Secondary | ICD-10-CM | POA: Diagnosis not present

## 2022-05-31 DIAGNOSIS — N644 Mastodynia: Secondary | ICD-10-CM | POA: Diagnosis not present

## 2022-05-31 DIAGNOSIS — R918 Other nonspecific abnormal finding of lung field: Secondary | ICD-10-CM | POA: Diagnosis not present

## 2022-05-31 DIAGNOSIS — J4550 Severe persistent asthma, uncomplicated: Secondary | ICD-10-CM | POA: Diagnosis not present

## 2022-05-31 LAB — HM MAMMOGRAPHY

## 2022-06-04 ENCOUNTER — Encounter: Payer: Self-pay | Admitting: Family Medicine

## 2022-06-05 DIAGNOSIS — M533 Sacrococcygeal disorders, not elsewhere classified: Secondary | ICD-10-CM | POA: Diagnosis not present

## 2022-06-10 ENCOUNTER — Encounter: Payer: Self-pay | Admitting: Internal Medicine

## 2022-06-20 ENCOUNTER — Ambulatory Visit (INDEPENDENT_AMBULATORY_CARE_PROVIDER_SITE_OTHER): Payer: Medicare HMO | Admitting: Family Medicine

## 2022-06-20 ENCOUNTER — Encounter: Payer: Self-pay | Admitting: Family Medicine

## 2022-06-20 VITALS — BP 119/57 | HR 63 | Ht 62.0 in | Wt 217.0 lb

## 2022-06-20 DIAGNOSIS — R1310 Dysphagia, unspecified: Secondary | ICD-10-CM

## 2022-06-20 NOTE — Progress Notes (Signed)
   Acute Office Visit  Subjective:     Patient ID: Jasmine Mclaughlin, female    DOB: February 11, 1950, 72 y.o.   MRN: [id number]  Chief Complaint  Patient presents with   Dysphagia    HPI Patient is in today for swallowing problems, recent choking.   Discussed the use of AI scribe software for clinical note transcription with the patient, who gave verbal consent to proceed.  History of Present Illness   The patient, with a history of asthma, presents with a year-long history of intermittent dysphagia, which has recently increased in frequency. They describe episodes of choking on food and saliva, with the sensation of food 'going down the windpipe' instead of the esophagus. The most recent episode occurred while eating walnuts, leading to a prolonged coughing fit and subsequent chest discomfort. The patient notes that dry and crumbly foods, such as cornbread, tend to exacerbate the issue. However, they have no difficulty swallowing pills with water. The patient has not previously sought medical attention for this issue. They are due for a colonoscopy with their gastroenterologist, Dr. Rhea Belton, in August.        ROS All review of systems negative except what is listed in the HPI      Objective:    BP (!) 119/57   Pulse 63   Ht 5\' 2"  (1.575 m)   Wt 217 lb (98.4 kg)   SpO2 98%   BMI 39.69 kg/m    Physical Exam Vitals reviewed.  Constitutional:      Appearance: Normal appearance.  Cardiovascular:     Rate and Rhythm: Normal rate and regular rhythm.     Pulses: Normal pulses.     Heart sounds: Normal heart sounds.  Pulmonary:     Effort: Pulmonary effort is normal.     Breath sounds: Normal breath sounds.  Skin:    General: Skin is warm and dry.  Neurological:     Mental Status: She is alert and oriented to person, place, and time.  Psychiatric:        Mood and Affect: Mood normal.        Behavior: Behavior normal.        Thought Content: Thought content normal.         Judgment: Judgment normal.     No results found for any visits on 06/20/22.      Assessment & Plan:   Problem List Items Addressed This Visit   None Visit Diagnoses     Dysphagia, unspecified type    -  Primary   Relevant Orders   Ambulatory referral to Gastroenterology       Dysphagia: Recurrent episodes of choking on food and saliva over the past year, with two recent episodes in the past week. No difficulty swallowing pills. No associated chest pain, shortness of breath, or fever. -Refer to gastroenterology for further evaluation, including possible endoscopy and swallow study. -Avoid trigger foods, eat slowly, chew thoroughly, and maintain upright posture while eating. -If symptoms worsen or patient develops fever or cough, consider chest x-ray to evaluate for aspiration.    No orders of the defined types were placed in this encounter.   Return if symptoms worsen or fail to improve.  Clayborne Dana, NP

## 2022-06-20 NOTE — Patient Instructions (Signed)
Eat slowly, chew thoroughly. Avoid dry/crumbly foods that tend to be your trigger Sit up straight when eating and stay upright for at least 30 minutes after meals Referral placed to GI to further evaluate Please contact office for follow-up if symptoms do not improve or worsen. Seek emergency care if symptoms become severe.

## 2022-06-27 ENCOUNTER — Encounter: Payer: Self-pay | Admitting: Family Medicine

## 2022-07-25 ENCOUNTER — Other Ambulatory Visit: Payer: Self-pay | Admitting: Neurology

## 2022-07-29 ENCOUNTER — Encounter: Payer: Self-pay | Admitting: Internal Medicine

## 2022-07-29 ENCOUNTER — Ambulatory Visit (AMBULATORY_SURGERY_CENTER): Payer: Medicare HMO

## 2022-07-29 VITALS — Ht 62.5 in | Wt 215.0 lb

## 2022-07-29 DIAGNOSIS — Z86010 Personal history of colonic polyps: Secondary | ICD-10-CM

## 2022-07-29 NOTE — Progress Notes (Signed)

## 2022-08-06 ENCOUNTER — Other Ambulatory Visit: Payer: Self-pay | Admitting: Family Medicine

## 2022-08-06 DIAGNOSIS — E785 Hyperlipidemia, unspecified: Secondary | ICD-10-CM

## 2022-08-06 DIAGNOSIS — M62838 Other muscle spasm: Secondary | ICD-10-CM

## 2022-08-06 NOTE — Progress Notes (Unsigned)
NEUROLOGY FOLLOW UP OFFICE NOTE  Jasmine Mclaughlin [id number]  Assessment/Plan:   1.  Unsteady gait with frequent falls secondary to residual lumbar radiculopathy/chronic pain 2.  Lumbar degenerative disc disease with chronic low back pain with bilateral radiculopathy - primarily involving right L5-S1. 3.  Benign paroxysmal positional vertigo - chronic issue off and on   1.  To optimize daytime pain control, increase gabapentin to 200mg  in morning; continue 300mg  at bedtime 2. If vertigo does not resolve, she will contact me and refer to PT for vestibular rehab 3.  Follow up 5 months.   Subjective:  Jasmine Mclaughlin is a 72 year old left-handed female with asthma, HTN, arthritis, osteoporosis, anxiety who follows up for degenerative lumbar spine disease  UPDATE: Current medications:  gabapentin 100mg  in AM and 300mg  QHS  Overall pain controlled.  Pain seems to resolve by 2 PM.  Over the past 10 days, she reports small spells of dizziness.  Not spinning sensation but does have sensation of movement.  Positional, particularly when moving head quickly or when she first sits up in bed in the morning.  Needs to sit on the side of the bed for a moment to allow feeling to pass.  May occur a couple of times a day.        HISTORY: Patient underwent lumbar fusion several years ago.  She had some tingling in the feet afterwards that resolved but then returned a couple of years later.  For years her toes tingled.  Over the past couple of years, she reports shooting pain in her toes of both feet.  When she is sitting, she finds herself constantly wiggling her toes.  In bed, she feels aching in her legs below the knees.  She tries moving her legs in bed which is ineffective and she has to get up and walk around at night.  She sometimes has shooting pain down the side of both legs.  She continues to have chronic back pain.  She returned to her orthopedist who performed a X-ray and was told she had  degenerative disc disease of the lumbar spine.  She also has osteoarthritis and hip pain.  She has history of right knee surgery.  She reports decreased feeling in her feet and doesn't feel stable when standing.  She has had multiple falls over the past year.  Sometimes she feels slight vertigo for a few seconds when she first gets up but dizziness is not the cause of her falls.  She just may fall over, especially if she has to bend over to pick something off of the floor.  She did go to physical therapy which didn't provide any lasting improvement in pain or balance.  She does not have diabetes.  B12 and TSH from September 2021 were 299 and 0.95 respectively.  Patient underwent workup for gait instability/frequent falls: 04/18/2020 NCV-EMG:  1. Chronic L5 radiculopathy affecting the right lower extremity, mild.  2.  There is no evidence of a sensorimotor polyneuropathy affecting the lower extremities.  04/05/2020 MRI L-SPINE WO:  Sequela of L2-L5 posterior spinal fusion. Associated susceptibility artifact limits evaluation.  Mild L1-2 spinal canal narrowing.  Mild left L3-4, L4-5, L5-S1 neural foraminal narrowing (spinal canal and right neural foramen at L5-S1 not well visualized).  No discrete fracture. Mild L1 bone marrow edema, likely degenerative related versus contusion. 05/04/2020 MRI BRAIN W WO:  No acute abnormality.  Chronic microvascular ischemic change in the white matter, basal ganglia, and pons  of a moderate to advanced degree. 07/19/2020 MRI C-SPINE WO:  Multilevel degenerative changes cervical spine with spinal and foraminal stenosis at multiple levels. No cord compression or fracture.  Mild spinal stenosis with mild cord flattening seen at C4-5 level but nothing significant.  She had endorsed memory concerns.  She underwent neuropsychological evaluation on 05/08/2021 which demonstrated an isolated weakness across phonemic fluency and very mild performance variability across encoding and  retrieval aspects of memory likely related to secondary factors such as anxiety, depression, sleep dysfunction and cerebrovascular disease but not suggestive of an underlying neurodegenerative disease such as Alzheimer's.    PAST MEDICAL HISTORY: Past Medical History:  Diagnosis Date   Allergic rhinitis 01/24/2015   Allergy    Asthma    Atypical chest pain 08/25/2019   Chest tightness 10/27/2013   COPD (chronic obstructive pulmonary disease)    Degenerative disc disease, lumbar 10/14/2012   Generalized anxiety disorder 11/13/2012   Genital warts    History of COVID-19 08/2020   Hyperlipidemia 08/08/2020   Major depressive disorder    Obesity (BMI 30-39.9) 10/14/2012   Osteoarthritis 09/28/2010   Osteoporosis    Palpitations 11/28/2020   Pneumonia    Primary hypertension 12/23/2019   Shortness of breath    Sinusitis, acute 10/27/2013   Tobacco abuse 09/28/2010   Tubular adenoma of colon 11/18/2017   Vitamin D deficiency 12/23/2019   Wheezing 10/27/2013    MEDICATIONS: Current Outpatient Medications on File Prior to Visit  Medication Sig Dispense Refill   acetaminophen (TYLENOL) 650 MG CR tablet Take 650 mg by mouth 2 times daily at 12 noon and 4 pm. Patient takes 2 tablets twice a day     albuterol (PROVENTIL) (2.5 MG/3ML) 0.083% nebulizer solution Take 3 mLs (2.5 mg total) by nebulization every 4 (four) hours as needed for wheezing or shortness of breath. 150 mL 1   albuterol (VENTOLIN HFA) 108 (90 Base) MCG/ACT inhaler Inhale 2 puffs into the lungs every 6 (six) hours as needed for wheezing or shortness of breath. 1 each 5   alendronate (FOSAMAX) 70 MG tablet Take 1 tablet (70 mg total) by mouth every 7 (seven) days. Take with a full glass of water on an empty stomach. 12 tablet 3   aspirin EC 81 MG tablet Take 81 mg by mouth daily. Swallow whole.     azelastine (ASTELIN) 0.1 % nasal spray Place 2 sprays into both nostrils 2 (two) times daily. Use in each nostril as directed  301 mL 1   BREO ELLIPTA 200-25 MCG/ACT AEPB Inhale 1 puff into the lungs daily.     calcium-vitamin D (OSCAL WITH D) 500-5 MG-MCG tablet Take 1 tablet by mouth.     chlorpheniramine (CHLOR-TRIMETON) 4 MG tablet Take 4 mg by mouth daily as needed for allergies.     cholecalciferol 25 MCG (1000 UT) tablet Take 1,000 Units by mouth daily.     citalopram (CELEXA) 40 MG tablet TAKE 1 TABLET BY MOUTH EVERY DAY 90 tablet 1   co-enzyme Q-10 30 MG capsule Take 30 mg by mouth 3 (three) times daily.     dilTIAZem HCl ER 300 MG TB24 Take 300 mg by mouth daily.     diphenhydrAMINE (SOMINEX) 25 MG tablet Take 25 mg by mouth at bedtime.     fluticasone (FLONASE) 50 MCG/ACT nasal spray SPRAY 2 SPRAYS INTO EACH NOSTRIL EVERY DAY 48 mL 2   gabapentin (NEURONTIN) 100 MG capsule Take 1 capsule (100 mg total) by mouth in  the morning. 30 capsule 5   gabapentin (NEURONTIN) 300 MG capsule TAKE 1 CAPSULE BY MOUTH EVERYDAY AT BEDTIME 90 capsule 0   hydrochlorothiazide (MICROZIDE) 12.5 MG capsule TAKE 1 CAPSULE BY MOUTH EVERY DAY 60 capsule 0   ipratropium (ATROVENT) 0.02 % nebulizer solution Take 2.5 mLs (0.5 mg total) by nebulization 4 (four) times daily. 75 mL 12   isosorbide mononitrate (IMDUR) 30 MG 24 hr tablet Take 0.5 tablets (15 mg total) by mouth daily. 45 tablet 1   montelukast (SINGULAIR) 10 MG tablet Take 1 tablet (10 mg total) by mouth at bedtime. 30 tablet 3   nitroGLYCERIN (NITROSTAT) 0.4 MG SL tablet Place 1 tablet (0.4 mg total) under the tongue every 5 (five) minutes as needed for chest pain. 90 tablet 3   simvastatin (ZOCOR) 20 MG tablet Take 1 tablet (20 mg total) by mouth at bedtime. 90 tablet 1   tiZANidine (ZANAFLEX) 4 MG capsule Take 1 capsule (4 mg total) by mouth 3 (three) times daily as needed for muscle spasms. 30 capsule 2   traMADol (ULTRAM) 50 MG tablet Take 50 mg by mouth 2 (two) times daily as needed. (Patient not taking: Reported on 07/29/2022)     vitamin B-12 (CYANOCOBALAMIN) 1000 MCG  tablet Take 1,000 mcg by mouth daily.     No current facility-administered medications on file prior to visit.    ALLERGIES: No Known Allergies  FAMILY HISTORY: Family History  Problem Relation Age of Onset   Arthritis Mother    Atrial fibrillation Mother    Arthritis Father    Stroke Father    Colon cancer Father 77   Heart disease Father 61       chf   Breast cancer Maternal Grandmother    Asthma Maternal Grandmother    Asthma Daughter    Esophageal cancer Neg Hx    Ulcerative colitis Neg Hx    Stomach cancer Neg Hx    Colon polyps Neg Hx    Rectal cancer Neg Hx       Objective:  Blood pressure 112/60, pulse 72, height 5\' 2"  (1.575 m), weight 221 lb (100.2 kg), SpO2 96%. General: No acute distress.  Patient appears well-groomed.   Head:  Normocephalic/atraumatic Eyes:  Fundi examined but not visualized Neck: supple, no paraspinal tenderness, full range of motion Heart:  Regular rate and rhythm Neurological Exam: alert and oriented.  Speech fluent and not dysarthric, language intact.  CN II-XII intact. Bulk and tone normal, muscle strength 5/5 throughout.  Sensation to pinprick and vibration reduced in toes up to mid-shins.  Deep tendon reflexes 2+ except absent in right patellar and both ankles.  Finger to nose testing intact.  Broad-based antalgic gait.  Lateral trunk bend to right.  Ambulates with cane.  Romberg negative.   Head Impulse Test:  positive to left   Shon Millet, DO  CC: Seabron Spates, DO

## 2022-08-07 ENCOUNTER — Ambulatory Visit: Payer: Medicare HMO | Admitting: Neurology

## 2022-08-07 ENCOUNTER — Encounter: Payer: Self-pay | Admitting: Neurology

## 2022-08-07 VITALS — BP 112/60 | HR 72 | Ht 62.0 in | Wt 221.0 lb

## 2022-08-07 DIAGNOSIS — M5416 Radiculopathy, lumbar region: Secondary | ICD-10-CM

## 2022-08-07 DIAGNOSIS — H8110 Benign paroxysmal vertigo, unspecified ear: Secondary | ICD-10-CM | POA: Diagnosis not present

## 2022-08-07 MED ORDER — GABAPENTIN 300 MG PO CAPS: 300.0000 mg | ORAL_CAPSULE | Freq: Every day | ORAL | 1 refills | Status: DC

## 2022-08-07 MED ORDER — GABAPENTIN 100 MG PO CAPS: 200.0000 mg | ORAL_CAPSULE | Freq: Every morning | ORAL | 1 refills | Status: DC

## 2022-08-07 NOTE — Patient Instructions (Signed)
Increase gabapentin 100mg  to 2 pills (200mg ) in morning.  Continue 300mg  at bedtime If dizzy spells do not subside, contact me and I will refer you to physical therapy Otherwise, follow up 6 months.

## 2022-08-08 HISTORY — PX: CATARACT EXTRACTION: SUR2

## 2022-08-19 ENCOUNTER — Telehealth: Payer: Self-pay

## 2022-08-19 ENCOUNTER — Encounter: Payer: Self-pay | Admitting: Neurology

## 2022-08-19 DIAGNOSIS — H8111 Benign paroxysmal vertigo, right ear: Secondary | ICD-10-CM

## 2022-08-19 NOTE — Telephone Encounter (Signed)
Per Dr.Jaffe, See mychart message. 08/19/22 Please refer to her preferred physical therapy for vestibular rehab

## 2022-08-20 NOTE — Addendum Note (Signed)
Addended by: Leida Lauth on: 08/20/2022 01:17 PM   Modules accepted: Orders

## 2022-08-20 NOTE — Telephone Encounter (Signed)
Patient prefers to have PT in [place].   Referral sent to  [facility]. Ether Griffins Physical Therapist, Physical Therapy [facility] - Medical [facility] Dupre [address], [place] [address]

## 2022-08-21 DIAGNOSIS — H2512 Age-related nuclear cataract, left eye: Secondary | ICD-10-CM | POA: Diagnosis not present

## 2022-08-21 DIAGNOSIS — H2511 Age-related nuclear cataract, right eye: Secondary | ICD-10-CM | POA: Diagnosis not present

## 2022-08-27 ENCOUNTER — Ambulatory Visit (AMBULATORY_SURGERY_CENTER): Payer: Medicare HMO | Admitting: Internal Medicine

## 2022-08-27 ENCOUNTER — Encounter: Payer: Self-pay | Admitting: Internal Medicine

## 2022-08-27 VITALS — BP 128/64 | HR 53 | Temp 97.1°F | Resp 12 | Ht 62.5 in | Wt 215.0 lb

## 2022-08-27 DIAGNOSIS — Z09 Encounter for follow-up examination after completed treatment for conditions other than malignant neoplasm: Secondary | ICD-10-CM | POA: Diagnosis not present

## 2022-08-27 DIAGNOSIS — K635 Polyp of colon: Secondary | ICD-10-CM | POA: Diagnosis not present

## 2022-08-27 DIAGNOSIS — D123 Benign neoplasm of transverse colon: Secondary | ICD-10-CM | POA: Diagnosis not present

## 2022-08-27 DIAGNOSIS — J449 Chronic obstructive pulmonary disease, unspecified: Secondary | ICD-10-CM | POA: Diagnosis not present

## 2022-08-27 DIAGNOSIS — Z86010 Personal history of colonic polyps: Secondary | ICD-10-CM

## 2022-08-27 DIAGNOSIS — D120 Benign neoplasm of cecum: Secondary | ICD-10-CM | POA: Diagnosis not present

## 2022-08-27 DIAGNOSIS — I10 Essential (primary) hypertension: Secondary | ICD-10-CM | POA: Diagnosis not present

## 2022-08-27 MED ORDER — SODIUM CHLORIDE 0.9 % IV SOLN: 500.0000 mL | Freq: Once | INTRAVENOUS | Status: DC

## 2022-08-27 NOTE — Op Note (Signed)
[facility] Patient Name: Jasmine Mclaughlin Procedure Date: 08/27/2022 10:23 AM MRN: [id number] Endoscopist: Beverley Fiedler , MD, [id number] Age: 72 Referring MD:  Date of Birth: 04/28/1950 Gender: Female Account #: [id number] Procedure:                Colonoscopy Indications:              Surveillance: Personal history of adenomatous                            polyps including polyps > 1 cm requiring EMR, Last                            colonoscopy: August 2021 Medicines:                Monitored Anesthesia Care Procedure:                Pre-Anesthesia Assessment:                           - Prior to the procedure, a History and Physical                            was performed, and patient medications and                            allergies were reviewed. The patient's tolerance of                            previous anesthesia was also reviewed. The risks                            and benefits of the procedure and the sedation                            options and risks were discussed with the patient.                            All questions were answered, and informed consent                            was obtained. Prior Anticoagulants: The patient has                            taken no anticoagulant or antiplatelet agents. ASA                            Grade Assessment: II - A patient with mild systemic                            disease. After reviewing the risks and benefits,                            the patient was deemed in satisfactory condition to  undergo the procedure.                           After obtaining informed consent, the colonoscope                            was passed under direct vision. Throughout the                            procedure, the patient's blood pressure, pulse, and                            oxygen saturations were monitored continuously. The                            CF [id number] #[id number] was introduced  through the anus                            and advanced to the cecum, identified by                            appendiceal orifice and ileocecal valve. The                            colonoscopy was performed without difficulty. The                            patient tolerated the procedure well. The quality                            of the bowel preparation was good. The ileocecal                            valve, appendiceal orifice, and rectum were                            photographed. Scope In: 10:32:02 AM Scope Out: 10:49:57 AM Scope Withdrawal Time: 0 hours 14 minutes 26 seconds  Total Procedure Duration: 0 hours 17 minutes 55 seconds  Findings:                 The digital rectal exam was normal.                           Two sessile polyps were found in the cecum. The                            polyps were 2 to 3 mm in size. These polyps were                            removed with a cold snare. Resection and retrieval                            were complete.  Three sessile polyps were found in the hepatic                            flexure. The polyps were 3 to 5 mm in size. These                            polyps were removed with a cold snare. Resection                            and retrieval were complete.                           Multiple medium-mouthed and small-mouthed                            diverticula were found in the sigmoid colon,                            descending colon, hepatic flexure and ascending                            colon.                           Internal hemorrhoids were found during                            retroflexion. The hemorrhoids were small. Complications:            No immediate complications. Estimated Blood Loss:     Estimated blood loss was minimal. Impression:               - Two 2 to 3 mm polyps in the cecum, removed with a                            cold snare. Resected and retrieved.                            - Three 3 to 5 mm polyps at the hepatic flexure,                            removed with a cold snare. Resected and retrieved.                           - Moderate diverticulosis in the sigmoid colon, in                            the descending colon, at the hepatic flexure and in                            the ascending colon.                           - Small internal hemorrhoids. Recommendation:           -  Patient has a contact number available for                            emergencies. The signs and symptoms of potential                            delayed complications were discussed with the                            patient. Return to normal activities tomorrow.                            Written discharge instructions were provided to the                            patient.                           - Resume previous diet.                           - Continue present medications.                           - Await pathology results.                           - Repeat colonoscopy in 3 years for surveillance. Beverley Fiedler, MD 08/27/2022 10:56:55 AM This report has been signed electronically.

## 2022-08-27 NOTE — Progress Notes (Signed)
Pt's states no medical or surgical changes since previsit or office visit. VS assessed gy D.T

## 2022-08-27 NOTE — Progress Notes (Signed)
Called to room to assist during endoscopic procedure.  Patient ID and intended procedure confirmed with present staff. Received instructions for my participation in the procedure from the performing physician.  

## 2022-08-27 NOTE — Progress Notes (Signed)
Sedate, gd SR, tolerated procedure well, VSS, report to RN 

## 2022-08-27 NOTE — Patient Instructions (Signed)
Handouts provided on polyps, diverticulosis and hemorrhoids.  Resume previous diet.  Continue present medications.  Await pathology results.  Repeat colonoscopy in 3 years for surveillance.   YOU HAD AN ENDOSCOPIC PROCEDURE TODAY AT THE [facility]:   Refer to the procedure report that was given to you for any specific questions about what was found during the examination.  If the procedure report does not answer your questions, please call your gastroenterologist to clarify.  If you requested that your care partner not be given the details of your procedure findings, then the procedure report has been included in a sealed envelope for you to review at your convenience later.  YOU SHOULD EXPECT: Some feelings of bloating in the abdomen. Passage of more gas than usual.  Walking can help get rid of the air that was put into your GI tract during the procedure and reduce the bloating. If you had a lower endoscopy (such as a colonoscopy or flexible sigmoidoscopy) you may notice spotting of blood in your stool or on the toilet paper. If you underwent a bowel prep for your procedure, you may not have a normal bowel movement for a few days.  Please Note:  You might notice some irritation and congestion in your nose or some drainage.  This is from the oxygen used during your procedure.  There is no need for concern and it should clear up in a day or so.  SYMPTOMS TO REPORT IMMEDIATELY:  Following lower endoscopy (colonoscopy or flexible sigmoidoscopy):  Excessive amounts of blood in the stool  Significant tenderness or worsening of abdominal pains  Swelling of the abdomen that is new, acute  Fever of 100F or higher  For urgent or emergent issues, a gastroenterologist can be reached at any hour by calling [phone]. Do not use MyChart messaging for urgent concerns.    DIET:  We do recommend a small meal at first, but then you may proceed to your regular diet.  Drink plenty of fluids  but you should avoid alcoholic beverages for 24 hours.  ACTIVITY:  You should plan to take it easy for the rest of today and you should NOT DRIVE or use heavy machinery until tomorrow (because of the sedation medicines used during the test).    FOLLOW UP: Our staff will call the number listed on your records the next business day following your procedure.  We will call around 7:15- 8:00 am to check on you and address any questions or concerns that you may have regarding the information given to you following your procedure. If we do not reach you, we will leave a message.     If any biopsies were taken you will be contacted by phone or by letter within the next 1-3 weeks.  Please call us at [phone] if you have not heard about the biopsies in 3 weeks.    SIGNATURES/CONFIDENTIALITY: You and/or your care partner have signed paperwork which will be entered into your electronic medical record.  These signatures attest to the fact that that the information above on your After Visit Summary has been reviewed and is understood.  Full responsibility of the confidentiality of this discharge information lies with you and/or your care-partner.

## 2022-08-27 NOTE — Progress Notes (Signed)
GASTROENTEROLOGY PROCEDURE H&P NOTE   Primary Care Physician: Donato Schultz, DO    Reason for Procedure:  History of multiple adenomatous colon polyps including large polyps requiring EMR  Plan:    Colonoscopy  Patient is appropriate for endoscopic procedure(s) in the ambulatory ([facility]) setting.  The nature of the procedure, as well as the risks, benefits, and alternatives were carefully and thoroughly reviewed with the patient. Ample time for discussion and questions allowed. The patient understood, was satisfied, and agreed to proceed.     HPI: Jasmine Mclaughlin is a 72 y.o. female who presents for surveillance colonoscopy.  Medical history as below.  Tolerated the prep.  No recent chest pain or shortness of breath.  No abdominal pain today.  Past Medical History:  Diagnosis Date   Allergic rhinitis 01/24/2015   Allergy    Asthma    Atypical chest pain 08/25/2019   Chest tightness 10/27/2013   COPD (chronic obstructive pulmonary disease)    Degenerative disc disease, lumbar 10/14/2012   Generalized anxiety disorder 11/13/2012   Genital warts    History of COVID-19 08/2020   Hyperlipidemia 08/08/2020   Major depressive disorder    Obesity (BMI 30-39.9) 10/14/2012   Osteoarthritis 09/28/2010   Osteoporosis    Palpitations 11/28/2020   Pneumonia    Primary hypertension 12/23/2019   Shortness of breath    Sinusitis, acute 10/27/2013   Tobacco abuse 09/28/2010   Tubular adenoma of colon 11/18/2017   Vitamin D deficiency 12/23/2019   Wheezing 10/27/2013    Past Surgical History:  Procedure Laterality Date   ANTERIOR FUSION LUMBAR SPINE  05/06/2012   ANTERIOR LAT LUMBAR FUSION N/A 05/06/2012   Procedure: ANTERIOR LATERAL LUMBAR FUSION 3 LEVELS;  Surgeon: Emilee Hero, MD;  Location: [facility] OR;  Service: Orthopedics;  Laterality: N/A;  Lateral interbody fusion, lumbar 2-3,lumbar 3-4, lumbar 4-5 with instrumentation and allograft.   COLONOSCOPY     COLONOSCOPY  WITH PROPOFOL N/A 12/15/2017   Procedure: COLONOSCOPY WITH PROPOFOL;  Surgeon: Meridee Score Netty Starring., MD;  Location: [facility] ENDOSCOPY;  Service: Gastroenterology;  Laterality: N/A;   COLONOSCOPY WITH PROPOFOL N/A 07/08/2018   Procedure: COLONOSCOPY WITH PROPOFOL;  Surgeon: Meridee Score Netty Starring., MD;  Location: [facility] ENDOSCOPY;  Service: Gastroenterology;  Laterality: N/A;   COLONOSCOPY WITH PROPOFOL N/A 09/01/2019   Procedure: COLONOSCOPY WITH PROPOFOL;  Surgeon: Meridee Score Netty Starring., MD;  Location: [facility] ENDOSCOPY;  Service: Gastroenterology;  Laterality: N/A;   KNEE ARTHROSCOPY Right     R knee 01/2012, for ligament tears   POLYPECTOMY  07/08/2018   Procedure: POLYPECTOMY;  Surgeon: Mansouraty, Netty Starring., MD;  Location: [facility] ENDOSCOPY;  Service: Gastroenterology;;   POLYPECTOMY  09/01/2019   Procedure: POLYPECTOMY;  Surgeon: Lemar Lofty., MD;  Location: [facility] END[facility]COPY;  Service: Gastroenterology;;   TONSILLECTOMY     VAGINAL DELIVERY     x1    Prior to Admission medications   Medication Sig Start Date End Date Taking? Authorizing Provider  acetaminophen (TYLENOL) 650 MG CR tablet Take 650 mg by mouth 2 times daily at 12 noon and 4 pm. Patient takes 2 tablets twice a day   Yes [provider]  albuterol (PROVENTIL) (2.5 MG/3ML) 0.083% nebulizer solution Take 3 mLs (2.5 mg total) by nebulization every 4 (four) hours as needed for wheezing or shortness of breath. 05/17/21  Yes Seabron Spates R, DO  albuterol (VENTOLIN HFA) 108 (90 Base) MCG/ACT inhaler Inhale 2 puffs into the lungs every 6 (six) hours  as needed for wheezing or shortness of breath. 05/10/21  Yes Seabron Spates R, DO  alendronate (FOSAMAX) 70 MG tablet Take 1 tablet (70 mg total) by mouth every 7 (seven) days. Take with a full glass of water on an empty stomach. 12/20/21  Yes Donato Schultz, DO  aspirin EC 81 MG tablet Take 81 mg by mouth daily. Swallow whole.   Yes [provider]  azelastine  (ASTELIN) 0.1 % nasal spray Place 2 sprays into both nostrils 2 (two) times daily. Use in each nostril as directed 11/22/21  Yes Clayborne Dana, NP  BREO ELLIPTA 200-25 MCG/ACT AEPB Inhale 1 puff into the lungs daily.   Yes [provider]  calcium-vitamin D (OSCAL WITH D) 500-5 MG-MCG tablet Take 1 tablet by mouth.   Yes [provider]  chlorpheniramine (CHLOR-TRIMETON) 4 MG tablet Take 4 mg by mouth daily as needed for allergies.   Yes [provider]  cholecalciferol 25 MCG (1000 UT) tablet Take 1,000 Units by mouth daily. 05/13/21  Yes [provider]  citalopram (CELEXA) 40 MG tablet TAKE 1 TABLET BY MOUTH EVERY DAY 04/24/22  Yes Seabron Spates R, DO  co-enzyme Q-10 30 MG capsule Take 30 mg by mouth 3 (three) times daily.   Yes [provider]  dilTIAZem HCl ER 300 MG TB24 Take 300 mg by mouth daily. 12/25/21  Yes [provider]  diphenhydrAMINE (SOMINEX) 25 MG tablet Take 25 mg by mouth at bedtime.   Yes [provider]  gabapentin (NEURONTIN) 100 MG capsule Take 2 capsules (200 mg total) by mouth in the morning. 08/07/22  Yes Jaffe, Adam R, DO  gabapentin (NEURONTIN) 300 MG capsule Take 1 capsule (300 mg total) by mouth at bedtime. 08/07/22  Yes Jaffe, Adam R, DO  hydrochlorothiazide (MICROZIDE) 12.5 MG capsule TAKE 1 CAPSULE BY MOUTH EVERY DAY 01/25/22  Yes Georgeanna Lea, MD  isosorbide mononitrate (IMDUR) 30 MG 24 hr tablet Take 0.5 tablets (15 mg total) by mouth daily. 11/09/21 08/27/22 Yes Tobb, Kardie, DO  montelukast (SINGULAIR) 10 MG tablet Take 1 tablet (10 mg total) by mouth at bedtime. 11/13/21  Yes Seabron Spates R, DO  simvastatin (ZOCOR) 20 MG tablet Take 1 tablet (20 mg total) by mouth at bedtime. 08/06/22  Yes Seabron Spates R, DO  vitamin B-12 (CYANOCOBALAMIN) 1000 MCG tablet Take 1,000 mcg by mouth daily.   Yes [provider]  fluticasone (FLONASE) 50 MCG/ACT nasal spray SPRAY 2 SPRAYS INTO  EACH NOSTRIL EVERY DAY Patient not taking: Reported on 08/27/2022 04/24/22   Seabron Spates R, DO  ipratropium (ATROVENT) 0.02 % nebulizer solution Take 2.5 mLs (0.5 mg total) by nebulization 4 (four) times daily. Patient not taking: Reported on 08/27/2022 05/17/21   Zola Button, Grayling Congress, DO  nitroGLYCERIN (NITROSTAT) 0.4 MG SL tablet Place 1 tablet (0.4 mg total) under the tongue every 5 (five) minutes as needed for chest pain. Patient not taking: Reported on 08/27/2022 05/26/20 08/07/22  Tobb, Kardie, DO  tiZANidine (ZANAFLEX) 4 MG capsule TAKE 1 CAPSULE BY MOUTH 3 TIMES DAILY AS NEEDED FOR MUSCLE SPASMS. Patient not taking: Reported on 08/27/2022 08/06/22   Zola Button, Grayling Congress, DO  traMADol (ULTRAM) 50 MG tablet Take 50 mg by mouth 2 (two) times daily as needed. Patient not taking: Reported on 08/27/2022 02/07/22   [provider]    Current Outpatient Medications  Medication Sig Dispense Refill   acetaminophen (TYLENOL) 650  MG CR tablet Take 650 mg by mouth 2 times daily at 12 noon and 4 pm. Patient takes 2 tablets twice a day     albuterol (PROVENTIL) (2.5 MG/3ML) 0.083% nebulizer solution Take 3 mLs (2.5 mg total) by nebulization every 4 (four) hours as needed for wheezing or shortness of breath. 150 mL 1   albuterol (VENTOLIN HFA) 108 (90 Base) MCG/ACT inhaler Inhale 2 puffs into the lungs every 6 (six) hours as needed for wheezing or shortness of breath. 1 each 5   alendronate (FOSAMAX) 70 MG tablet Take 1 tablet (70 mg total) by mouth every 7 (seven) days. Take with a full glass of water on an empty stomach. 12 tablet 3   aspirin EC 81 MG tablet Take 81 mg by mouth daily. Swallow whole.     azelastine (ASTELIN) 0.1 % nasal spray Place 2 sprays into both nostrils 2 (two) times daily. Use in each nostril as directed 301 mL 1   BREO ELLIPTA 200-25 MCG/ACT AEPB Inhale 1 puff into the lungs daily.     calcium-vitamin D (OSCAL WITH D) 500-5 MG-MCG tablet Take 1 tablet by mouth.      chlorpheniramine (CHLOR-TRIMETON) 4 MG tablet Take 4 mg by mouth daily as needed for allergies.     cholecalciferol 25 MCG (1000 UT) tablet Take 1,000 Units by mouth daily.     citalopram (CELEXA) 40 MG tablet TAKE 1 TABLET BY MOUTH EVERY DAY 90 tablet 1   co-enzyme Q-10 30 MG capsule Take 30 mg by mouth 3 (three) times daily.     dilTIAZem HCl ER 300 MG TB24 Take 300 mg by mouth daily.     diphenhydrAMINE (SOMINEX) 25 MG tablet Take 25 mg by mouth at bedtime.     gabapentin (NEURONTIN) 100 MG capsule Take 2 capsules (200 mg total) by mouth in the morning. 180 capsule 1   gabapentin (NEURONTIN) 300 MG capsule Take 1 capsule (300 mg total) by mouth at bedtime. 90 capsule 1   hydrochlorothiazide (MICROZIDE) 12.5 MG capsule TAKE 1 CAPSULE BY MOUTH EVERY DAY 60 capsule 0   isosorbide mononitrate (IMDUR) 30 MG 24 hr tablet Take 0.5 tablets (15 mg total) by mouth daily. 45 tablet 1   montelukast (SINGULAIR) 10 MG tablet Take 1 tablet (10 mg total) by mouth at bedtime. 30 tablet 3   simvastatin (ZOCOR) 20 MG tablet Take 1 tablet (20 mg total) by mouth at bedtime. 90 tablet 1   vitamin B-12 (CYANOCOBALAMIN) 1000 MCG tablet Take 1,000 mcg by mouth daily.     fluticasone (FLONASE) 50 MCG/ACT nasal spray SPRAY 2 SPRAYS INTO EACH NOSTRIL EVERY DAY (Patient not taking: Reported on 08/27/2022) 48 mL 2   ipratropium (ATROVENT) 0.02 % nebulizer solution Take 2.5 mLs (0.5 mg total) by nebulization 4 (four) times daily. (Patient not taking: Reported on 08/27/2022) 75 mL 12   nitroGLYCERIN (NITROSTAT) 0.4 MG SL tablet Place 1 tablet (0.4 mg total) under the tongue every 5 (five) minutes as needed for chest pain. (Patient not taking: Reported on 08/27/2022) 90 tablet 3   tiZANidine (ZANAFLEX) 4 MG capsule TAKE 1 CAPSULE BY MOUTH 3 TIMES DAILY AS NEEDED FOR MUSCLE SPASMS. (Patient not taking: Reported on 08/27/2022) 30 capsule 2   traMADol (ULTRAM) 50 MG tablet Take 50 mg by mouth 2 (two) times daily as needed. (Patient not  taking: Reported on 08/27/2022)     Current Facility-Administered Medications  Medication Dose Route Frequency Provider Last Rate Last Admin  0.9 %  sodium chloride infusion  500 mL Intravenous Once Debria Broecker, Carie Caddy, MD        Allergies as of 08/27/2022   (No Known Allergies)    Family History  Problem Relation Age of Onset   Arthritis Mother    Atrial fibrillation Mother    Arthritis Father    Stroke Father    Colon cancer Father 49   Heart disease Father 31       chf   Breast cancer Maternal Grandmother    Asthma Maternal Grandmother    Asthma Daughter    Esophageal cancer Neg Hx    Ulcerative colitis Neg Hx    Stomach cancer Neg Hx    Colon polyps Neg Hx    Rectal cancer Neg Hx     Social History   Socioeconomic History   Marital status: Single    Spouse name: Not on file   Number of children: 1   Years of education: 12   Highest education level: Some college, no degree  Occupational History   Occupation: Retired    Comment: Audiological scientist  Tobacco Use   Smoking status: Former    Current packs/day: 0.00    Average packs/day: 0.5 packs/day for 55.0 years (27.5 ttl pk-yrs)    Types: Cigarettes    Start date: 09/20/1966    Quit date: 09/19/2021    Years since quitting: 0.9   Smokeless tobacco: Never  Vaping Use   Vaping status: Never Used  Substance and Sexual Activity   Alcohol use: Yes    Alcohol/week: 7.0 - 14.0 standard drinks of alcohol    Types: 7 - 14 Standard drinks or equivalent per week    Comment: at least 1 drink daily, but described alcohol use as "a habit" and that it "gets out of hand" at times   Drug use: No   Sexual activity: Not Currently    Partners: Male  Other Topics Concern   Not on file  Social History Narrative   1 caffeine drinks daily   No exercise   Left handed   One story home   Social Determinants of Health   Financial Resource Strain: Patient Declined (06/19/2022)   Overall Financial Resource Strain (CARDIA)    Difficulty of  Paying Living Expenses: Patient declined  Food Insecurity: Patient Declined (06/19/2022)   Hunger Vital Sign    Worried About Running Out of Food in the Last Year: Patient declined    Ran Out of Food in the Last Year: Patient declined  Transportation Needs: No Transportation Needs (06/19/2022)   PRAPARE - Administrator, Civil Service (Medical): No    Lack of Transportation (Non-Medical): No  Physical Activity: Insufficiently Active (06/19/2022)   Exercise Vital Sign    Days of Exercise per Week: 1 day    Minutes of Exercise per Session: 10 min  Stress: Stress Concern Present (06/19/2022)   [employer] of Occupational Health - Occupational Stress Questionnaire    Feeling of Stress : Rather much  Social Connections: Unknown (06/19/2022)   Social Connection and Isolation Panel [NHANES]    Frequency of Communication with Friends and Family: Patient declined    Frequency of Social Gatherings with Friends and Family: Patient declined    Attends Religious Services: Patient declined    Active Member of Clubs or Organizations: No    Attends Banker Meetings: Never    Marital Status: Divorced  Catering manager Violence: Not At Risk (11/09/2020)   Humiliation,  Afraid, Rape, and Kick questionnaire    Fear of Current or Ex-Partner: No    Emotionally Abused: No    Physically Abused: No    Sexually Abused: No    Physical Exam: Vital signs in last 24 hours: @BP  127/67   Pulse 67   Temp (!) 97.1 F (36.2 C) (Skin)   Ht 5' 2.5" (1.588 m)   Wt 215 lb (97.5 kg)   SpO2 94%   BMI 38.70 kg/m  GEN: NAD EYE: Sclerae anicteric ENT: MMM CV: Non-tachycardic Pulm: CTA b/l GI: Soft, NT/ND NEURO:  Alert & Oriented x 3   Erick Blinks, MD [facility]  08/27/2022 10:21 AM

## 2022-08-28 ENCOUNTER — Telehealth: Payer: Self-pay

## 2022-08-28 NOTE — Telephone Encounter (Signed)
  Follow up Call-     08/27/2022   10:06 AM  Call back number  Post procedure Call Back phone  # [phone]  Permission to leave phone message Yes     Patient questions:  Do you have a fever, pain , or abdominal swelling? No. Pain Score  0 *  Have you tolerated food without any problems? Yes.    Have you been able to return to your normal activities? Yes.    Do you have any questions about your discharge instructions: Diet   No. Medications  No. Follow up visit  No.  Do you have questions or concerns about your Care? No.  Actions: * If pain score is 4 or above: No action needed, pain <4.

## 2022-08-29 DIAGNOSIS — H8111 Benign paroxysmal vertigo, right ear: Secondary | ICD-10-CM | POA: Diagnosis not present

## 2022-09-02 ENCOUNTER — Encounter: Payer: Self-pay | Admitting: Internal Medicine

## 2022-09-06 DIAGNOSIS — H2512 Age-related nuclear cataract, left eye: Secondary | ICD-10-CM | POA: Diagnosis not present

## 2022-09-06 DIAGNOSIS — H269 Unspecified cataract: Secondary | ICD-10-CM | POA: Diagnosis not present

## 2022-09-06 DIAGNOSIS — H25812 Combined forms of age-related cataract, left eye: Secondary | ICD-10-CM | POA: Diagnosis not present

## 2022-09-08 HISTORY — PX: CATARACT EXTRACTION: SUR2

## 2022-09-12 DIAGNOSIS — H8110 Benign paroxysmal vertigo, unspecified ear: Secondary | ICD-10-CM | POA: Diagnosis not present

## 2022-09-12 DIAGNOSIS — R42 Dizziness and giddiness: Secondary | ICD-10-CM | POA: Diagnosis not present

## 2022-09-12 DIAGNOSIS — H8111 Benign paroxysmal vertigo, right ear: Secondary | ICD-10-CM | POA: Diagnosis not present

## 2022-09-20 DIAGNOSIS — H2511 Age-related nuclear cataract, right eye: Secondary | ICD-10-CM | POA: Diagnosis not present

## 2022-09-20 DIAGNOSIS — H25811 Combined forms of age-related cataract, right eye: Secondary | ICD-10-CM | POA: Diagnosis not present

## 2022-09-20 DIAGNOSIS — H269 Unspecified cataract: Secondary | ICD-10-CM | POA: Diagnosis not present

## 2022-10-08 DIAGNOSIS — R918 Other nonspecific abnormal finding of lung field: Secondary | ICD-10-CM | POA: Diagnosis not present

## 2022-10-22 ENCOUNTER — Other Ambulatory Visit: Payer: Self-pay | Admitting: Family Medicine

## 2022-10-22 DIAGNOSIS — F418 Other specified anxiety disorders: Secondary | ICD-10-CM

## 2022-10-31 ENCOUNTER — Telehealth: Payer: Medicare HMO | Admitting: Family Medicine

## 2022-10-31 ENCOUNTER — Encounter: Payer: Self-pay | Admitting: Family Medicine

## 2022-10-31 DIAGNOSIS — J0140 Acute pansinusitis, unspecified: Secondary | ICD-10-CM

## 2022-10-31 DIAGNOSIS — R051 Acute cough: Secondary | ICD-10-CM | POA: Diagnosis not present

## 2022-10-31 DIAGNOSIS — E041 Nontoxic single thyroid nodule: Secondary | ICD-10-CM

## 2022-10-31 MED ORDER — AMOXICILLIN-POT CLAVULANATE 875-125 MG PO TABS: 1.0000 | ORAL_TABLET | Freq: Two times a day (BID) | ORAL | 0 refills | Status: DC

## 2022-10-31 MED ORDER — PROMETHAZINE-DM 6.25-15 MG/5ML PO SYRP: 5.0000 mL | ORAL_SOLUTION | Freq: Four times a day (QID) | ORAL | 0 refills | Status: DC | PRN

## 2022-10-31 NOTE — Progress Notes (Signed)
MyChart Video Visit    Virtual Visit via Video Note   This patient is at least at moderate risk for complications without adequate follow up. This format is felt to be most appropriate for this patient at this time. Physical exam was limited by quality of the video and audio technology used for the visit. Jasmine Mclaughlin was able to get the patient set up on a video visit.  Patient location: home Patient and provider in visit Provider location: Office  I discussed the limitations of evaluation and management by telemedicine and the availability of in person appointments. The patient expressed understanding and agreed to proceed.  Visit Date: 10/31/2022  Today's healthcare provider: Donato Schultz, DO     Subjective:    Patient ID: Jasmine Mclaughlin, female    DOB: 02-06-1950, 72 y.o.   MRN: [id number]  Chief Complaint  Patient presents with   Cough    HPI Patient is in today for congestion, cough.   Discussed the use of AI scribe software for clinical note transcription with the patient, who gave verbal consent to proceed.  History of Present Illness   The patient presents with a 3-day history of upper respiratory symptoms, described as 'the crud.' She reports severe head congestion, which she describes as 'so tight it won't go anywhere.' The congestion has started to move into the chest, causing a dry, hacking cough. She has been managing symptoms with Tylenol. She has also been using fluconazole and Astelin nasal spray, but reports no relief from these medications.  The patient has also experienced a fever, but is unsure of the exact temperature. She notes that her baseline temperature has been around 97.1 degrees since a COVID-19 infection two years ago, but it has risen to 99 degrees during this illness.  In addition to these symptoms, the patient reports periodic sleep disturbances, sleeping for a couple of hours at a time before waking.  The patient also mentions a recent  low-dose CT scan of the lungs, which revealed three nodules in one lung and a nodule in the right side of the thyroid. She has an upcoming appointment with a GI doctor to discuss swallowing difficulties and is considering further imaging of the thyroid.       History of Present Illness   The patient presents with a 3-day history of upper respiratory symptoms, described as 'the crud.' She reports severe head congestion, which she describes as 'so tight it won't go anywhere.' The congestion has started to move into the chest, causing a dry, hacking cough. She has been managing symptoms with Tylenol. She has also been using fluconazole and Astelin nasal spray, but reports no relief from these medications.  The patient has also experienced a fever, but is unsure of the exact temperature. She notes that her baseline temperature has been around 97.1 degrees since a COVID-19 infection two years ago, but it has risen to 99 degrees during this illness.  In addition to these symptoms, the patient reports periodic sleep disturbances, sleeping for a couple of hours at a time before waking.  The patient also mentions a recent low-dose CT scan of the lungs, which revealed three nodules in one lung and a nodule in the right side of the thyroid. She has an upcoming appointment with a GI doctor to discuss swallowing difficulties and is considering further imaging of the thyroid.      Past Medical History:  Diagnosis Date   Allergic rhinitis 01/24/2015   Allergy  Asthma    Atypical chest pain 08/25/2019   Chest tightness 10/27/2013   COPD (chronic obstructive pulmonary disease)    Degenerative disc disease, lumbar 10/14/2012   Generalized anxiety disorder 11/13/2012   Genital warts    History of COVID-19 08/2020   Hyperlipidemia 08/08/2020   Major depressive disorder    Obesity (BMI 30-39.9) 10/14/2012   Osteoarthritis 09/28/2010   Osteoporosis    Palpitations 11/28/2020   Pneumonia    Primary  hypertension 12/23/2019   Shortness of breath    Sinusitis, acute 10/27/2013   Tobacco abuse 09/28/2010   Tubular adenoma of colon 11/18/2017   Vitamin D deficiency 12/23/2019   Wheezing 10/27/2013    Past Surgical History:  Procedure Laterality Date   ANTERIOR FUSION LUMBAR SPINE  05/06/2012   ANTERIOR LAT LUMBAR FUSION N/A 05/06/2012   Procedure: ANTERIOR LATERAL LUMBAR FUSION 3 LEVELS;  Surgeon: Emilee Hero, MD;  Location: [facility] OR;  Service: Orthopedics;  Laterality: N/A;  Lateral interbody fusion, lumbar 2-3,lumbar 3-4, lumbar 4-5 with instrumentation and allograft.   COLONOSCOPY     COLONOSCOPY WITH PROPOFOL N/A 12/15/2017   Procedure: COLONOSCOPY WITH PROPOFOL;  Surgeon: Meridee Score Netty Starring., MD;  Location: [facility] ENDOSCOPY;  Service: Gastroenterology;  Laterality: N/A;   COLONOSCOPY WITH PROPOFOL N/A 07/08/2018   Procedure: COLONOSCOPY WITH PROPOFOL;  Surgeon: Meridee Score Netty Starring., MD;  Location: [facility] ENDOSCOPY;  Service: Gastroenterology;  Laterality: N/A;   COLONOSCOPY WITH PROPOFOL N/A 09/01/2019   Procedure: COLONOSCOPY WITH PROPOFOL;  Surgeon: Meridee Score Netty Starring., MD;  Location: [facility] ENDOSCOPY;  Service: Gastroenterology;  Laterality: N/A;   KNEE ARTHROSCOPY Right     R knee 01/2012, for ligament tears   POLYPECTOMY  07/08/2018   Procedure: POLYPECTOMY;  Surgeon: Mansouraty, Netty Starring., MD;  Location: [facility] ENDOSCOPY;  Service: Gastroenterology;;   POLYPECTOMY  09/01/2019   Procedure: POLYPECTOMY;  Surgeon: Lemar Lofty., MD;  Location: [facility] ENDOSCOPY;  Service: Gastroenterology;;   TONSILLECTOMY     VAGINAL DELIVERY     x1    Family History  Problem Relation Age of Onset   Arthritis Mother    Atrial fibrillation Mother    Arthritis Father    Stroke Father    Colon cancer Father 55   Heart disease Father 43       chf   Breast cancer Maternal Grandmother    Asthma Maternal Grandmother    Asthma Daughter    Esophageal cancer Neg Hx    Ulcerative colitis  Neg Hx    Stomach cancer Neg Hx    Colon polyps Neg Hx    Rectal cancer Neg Hx     Social History   Socioeconomic History   Marital status: Single    Spouse name: Not on file   Number of children: 1   Years of education: 12   Highest education level: Some college, no degree  Occupational History   Occupation: Retired    Comment: Audiological scientist  Tobacco Use   Smoking status: Former    Current packs/day: 0.00    Average packs/day: 0.5 packs/day for 55.0 years (27.5 ttl pk-yrs)    Types: Cigarettes    Start date: 09/20/1966    Quit date: 09/19/2021    Years since quitting: 1.1   Smokeless tobacco: Never  Vaping Use   Vaping status: Never Used  Substance and Sexual Activity   Alcohol use: Yes    Alcohol/week: 7.0 - 14.0 standard drinks of alcohol    Types: 7 - 14 Standard drinks  or equivalent per week    Comment: at least 1 drink daily, but described alcohol use as "a habit" and that it "gets out of hand" at times   Drug use: No   Sexual activity: Not Currently    Partners: Male  Other Topics Concern   Not on file  Social History Narrative   1 caffeine drinks daily   No exercise   Left handed   One story home   Social Determinants of Health   Financial Resource Strain: Patient Declined (06/19/2022)   Overall Financial Resource Strain (CARDIA)    Difficulty of Paying Living Expenses: Patient declined  Food Insecurity: Patient Declined (06/19/2022)   Hunger Vital Sign    Worried About Running Out of Food in the Last Year: Patient declined    Ran Out of Food in the Last Year: Patient declined  Transportation Needs: No Transportation Needs (06/19/2022)   PRAPARE - Administrator, Civil Service (Medical): No    Lack of Transportation (Non-Medical): No  Physical Activity: Insufficiently Active (06/19/2022)   Exercise Vital Sign    Days of Exercise per Week: 1 day    Minutes of Exercise per Session: 10 min  Stress: Stress Concern Present (06/19/2022)   [employer] - Occupational Stress Questionnaire    Feeling of Stress : Rather much  Social Connections: Unknown (06/19/2022)   Social Connection and Isolation Panel [NHANES]    Frequency of Communication with Friends and Family: Patient declined    Frequency of Social Gatherings with Friends and Family: Patient declined    Attends Religious Services: Patient declined    Database administrator or Organizations: No    Attends Banker Meetings: Never    Marital Status: Divorced  Catering manager Violence: Not At Risk (11/09/2020)   Humiliation, Afraid, Rape, and Kick questionnaire    Fear of Current or Ex-Partner: No    Emotionally Abused: No    Physically Abused: No    Sexually Abused: No    Outpatient Medications Prior to Visit  Medication Sig Dispense Refill   acetaminophen (TYLENOL) 650 MG CR tablet Take 650 mg by mouth 2 times daily at 12 noon and 4 pm. Patient takes 2 tablets twice a day     albuterol (PROVENTIL) (2.5 MG/3ML) 0.083% nebulizer solution Take 3 mLs (2.5 mg total) by nebulization every 4 (four) hours as needed for wheezing or shortness of breath. 150 mL 1   albuterol (VENTOLIN HFA) 108 (90 Base) MCG/ACT inhaler Inhale 2 puffs into the lungs every 6 (six) hours as needed for wheezing or shortness of breath. 1 each 5   alendronate (FOSAMAX) 70 MG tablet Take 1 tablet (70 mg total) by mouth every 7 (seven) days. Take with a full glass of water on an empty stomach. 12 tablet 3   aspirin EC 81 MG tablet Take 81 mg by mouth daily. Swallow whole.     azelastine (ASTELIN) 0.1 % nasal spray Place 2 sprays into both nostrils 2 (two) times daily. Use in each nostril as directed 301 mL 1   BREO ELLIPTA 200-25 MCG/ACT AEPB Inhale 1 puff into the lungs daily.     calcium-vitamin D (OSCAL WITH D) 500-5 MG-MCG tablet Take 1 tablet by mouth.     chlorpheniramine (CHLOR-TRIMETON) 4 MG tablet Take 4 mg by mouth daily as needed for allergies.      cholecalciferol 25 MCG (1000 UT) tablet Take 1,000 Units by mouth daily.  citalopram (CELEXA) 40 MG tablet Take 1 tablet (40 mg total) by mouth daily. 90 tablet 1   co-enzyme Q-10 30 MG capsule Take 30 mg by mouth 3 (three) times daily.     dilTIAZem HCl ER 300 MG TB24 Take 300 mg by mouth daily.     diphenhydrAMINE (SOMINEX) 25 MG tablet Take 25 mg by mouth at bedtime.     gabapentin (NEURONTIN) 100 MG capsule Take 2 capsules (200 mg total) by mouth in the morning. 180 capsule 1   gabapentin (NEURONTIN) 300 MG capsule Take 1 capsule (300 mg total) by mouth at bedtime. 90 capsule 1   hydrochlorothiazide (MICROZIDE) 12.5 MG capsule TAKE 1 CAPSULE BY MOUTH EVERY DAY 60 capsule 0   ipratropium (ATROVENT) 0.02 % nebulizer solution Take 2.5 mLs (0.5 mg total) by nebulization 4 (four) times daily. 75 mL 12   montelukast (SINGULAIR) 10 MG tablet Take 1 tablet (10 mg total) by mouth at bedtime. 30 tablet 3   simvastatin (ZOCOR) 20 MG tablet Take 1 tablet (20 mg total) by mouth at bedtime. 90 tablet 1   tiZANidine (ZANAFLEX) 4 MG capsule TAKE 1 CAPSULE BY MOUTH 3 TIMES DAILY AS NEEDED FOR MUSCLE SPASMS. 30 capsule 2   vitamin B-12 (CYANOCOBALAMIN) 1000 MCG tablet Take 1,000 mcg by mouth daily.     fluticasone (FLONASE) 50 MCG/ACT nasal spray SPRAY 2 SPRAYS INTO EACH NOSTRIL EVERY DAY (Patient not taking: Reported on 08/27/2022) 48 mL 2   isosorbide mononitrate (IMDUR) 30 MG 24 hr tablet Take 0.5 tablets (15 mg total) by mouth daily. 45 tablet 1   nitroGLYCERIN (NITROSTAT) 0.4 MG SL tablet Place 1 tablet (0.4 mg total) under the tongue every 5 (five) minutes as needed for chest pain. (Patient not taking: Reported on 08/27/2022) 90 tablet 3   traMADol (ULTRAM) 50 MG tablet Take 50 mg by mouth 2 (two) times daily as needed. (Patient not taking: Reported on 08/27/2022)     No facility-administered medications prior to visit.    No Known Allergies  Review of Systems  Constitutional:  Positive for fever.  Negative for malaise/fatigue.  HENT:  Positive for congestion and sinus pain.   Eyes:  Negative for blurred vision.  Respiratory:  Negative for shortness of breath.   Cardiovascular:  Negative for chest pain, palpitations and leg swelling.  Gastrointestinal:  Negative for abdominal pain, blood in stool and nausea.  Genitourinary:  Negative for dysuria and frequency.  Musculoskeletal:  Negative for falls.  Skin:  Negative for rash.  Neurological:  Negative for dizziness, loss of consciousness and headaches.  Endo/Heme/Allergies:  Negative for environmental allergies.  Psychiatric/Behavioral:  Negative for depression. The patient is not nervous/anxious.        Objective:    Physical Exam Vitals and nursing note reviewed.  Pulmonary:     Effort: Pulmonary effort is normal.  Psychiatric:        Mood and Affect: Mood normal.        Behavior: Behavior normal.        Thought Content: Thought content normal.    There were no vitals taken for this visit. Wt Readings from Last 3 Encounters:  08/27/22 215 lb (97.5 kg)  08/07/22 221 lb (100.2 kg)  07/29/22 215 lb (97.5 kg)       Assessment & Plan:  Acute non-recurrent pansinusitis -     Amoxicillin-Pot Clavulanate; Take 1 tablet by mouth 2 (two) times daily.  Dispense: 20 tablet; Refill: 0  Acute cough -  Promethazine-DM; Take 5 mLs by mouth 4 (four) times daily as needed.  Dispense: 118 mL; Refill: 0  Thyroid nodule -     US THYROID; Future   Assessment and Plan    Upper Respiratory Infection Symptoms started on Sunday night with congestion and pressure in the head, now moving into the chest with a dry cough. Fever up to 99. Currently on Tylenol, fluconazole, and Astelin with no improvement. -Prescribe antibiotic and cough syrup. -Continue current medications. -Advise to call if symptoms do not improve.  Thyroid Nodule Noted on recent low-dose CT scan of the lungs. Patient has upcoming appointment with GI for swallowing  issues, which could potentially be related if the nodule is large enough. -Order thyroid ultrasound. -Discuss results at follow-up appointment.  General Health Maintenance -Flu shot scheduled for November appointment.        I discussed the assessment and treatment plan with the patient. The patient was provided an opportunity to ask questions and all were answered. The patient agreed with the plan and demonstrated an understanding of the instructions.   The patient was advised to call back or seek an in-person evaluation if the symptoms worsen or if the condition fails to improve as anticipated.  Donato Schultz, DO [facility] [phone] (phone) [phone] (fax)  [facility]

## 2022-11-02 ENCOUNTER — Ambulatory Visit (HOSPITAL_BASED_OUTPATIENT_CLINIC_OR_DEPARTMENT_OTHER)
Admission: RE | Admit: 2022-11-02 | Discharge: 2022-11-02 | Disposition: A | Discharge status: Home / Self Care | Payer: Medicare HMO | Source: Ambulatory Visit | Attending: Family Medicine | Admitting: Family Medicine

## 2022-11-02 DIAGNOSIS — E041 Nontoxic single thyroid nodule: Secondary | ICD-10-CM | POA: Diagnosis not present

## 2022-11-02 DIAGNOSIS — E042 Nontoxic multinodular goiter: Secondary | ICD-10-CM | POA: Diagnosis not present

## 2022-11-07 DIAGNOSIS — G4700 Insomnia, unspecified: Secondary | ICD-10-CM | POA: Diagnosis not present

## 2022-11-07 DIAGNOSIS — I10 Essential (primary) hypertension: Secondary | ICD-10-CM | POA: Diagnosis not present

## 2022-11-07 DIAGNOSIS — G4733 Obstructive sleep apnea (adult) (pediatric): Secondary | ICD-10-CM | POA: Diagnosis not present

## 2022-11-07 DIAGNOSIS — I471 Supraventricular tachycardia, unspecified: Secondary | ICD-10-CM | POA: Diagnosis not present

## 2022-11-07 DIAGNOSIS — G4710 Hypersomnia, unspecified: Secondary | ICD-10-CM | POA: Diagnosis not present

## 2022-11-11 ENCOUNTER — Encounter: Payer: Self-pay | Admitting: Family Medicine

## 2022-11-15 ENCOUNTER — Encounter: Payer: Self-pay | Admitting: Family Medicine

## 2022-11-15 ENCOUNTER — Ambulatory Visit (INDEPENDENT_AMBULATORY_CARE_PROVIDER_SITE_OTHER): Payer: Medicare HMO | Admitting: Family Medicine

## 2022-11-15 VITALS — BP 110/60 | HR 69 | Temp 98.7°F | Resp 18 | Ht 62.5 in | Wt 222.4 lb

## 2022-11-15 DIAGNOSIS — I10 Essential (primary) hypertension: Secondary | ICD-10-CM | POA: Diagnosis not present

## 2022-11-15 DIAGNOSIS — E538 Deficiency of other specified B group vitamins: Secondary | ICD-10-CM | POA: Diagnosis not present

## 2022-11-15 DIAGNOSIS — E785 Hyperlipidemia, unspecified: Secondary | ICD-10-CM | POA: Diagnosis not present

## 2022-11-15 DIAGNOSIS — M62838 Other muscle spasm: Secondary | ICD-10-CM | POA: Insufficient documentation

## 2022-11-15 DIAGNOSIS — Z0000 Encounter for general adult medical examination without abnormal findings: Secondary | ICD-10-CM | POA: Diagnosis not present

## 2022-11-15 DIAGNOSIS — F17200 Nicotine dependence, unspecified, uncomplicated: Secondary | ICD-10-CM | POA: Diagnosis not present

## 2022-11-15 DIAGNOSIS — J4550 Severe persistent asthma, uncomplicated: Secondary | ICD-10-CM | POA: Diagnosis not present

## 2022-11-15 DIAGNOSIS — G4733 Obstructive sleep apnea (adult) (pediatric): Secondary | ICD-10-CM | POA: Insufficient documentation

## 2022-11-15 DIAGNOSIS — E559 Vitamin D deficiency, unspecified: Secondary | ICD-10-CM | POA: Diagnosis not present

## 2022-11-15 DIAGNOSIS — F418 Other specified anxiety disorders: Secondary | ICD-10-CM | POA: Diagnosis not present

## 2022-11-15 DIAGNOSIS — E041 Nontoxic single thyroid nodule: Secondary | ICD-10-CM | POA: Diagnosis not present

## 2022-11-15 DIAGNOSIS — Z23 Encounter for immunization: Secondary | ICD-10-CM | POA: Diagnosis not present

## 2022-11-15 DIAGNOSIS — R918 Other nonspecific abnormal finding of lung field: Secondary | ICD-10-CM | POA: Diagnosis not present

## 2022-11-15 LAB — CBC WITH DIFFERENTIAL/PLATELET
Basophils Absolute: 0.1 10*3/uL (ref 0.0–0.1)
Basophils Relative: 0.7 % (ref 0.0–3.0)
Eosinophils Absolute: 0.2 10*3/uL (ref 0.0–0.7)
Eosinophils Relative: 2.7 % (ref 0.0–5.0)
HCT: 37.1 % (ref 36.0–46.0)
Hemoglobin: 12.3 g/dL (ref 12.0–15.0)
Lymphocytes Relative: 30.9 % (ref 12.0–46.0)
Lymphs Abs: 2.8 10*3/uL (ref 0.7–4.0)
MCHC: 33.1 g/dL (ref 30.0–36.0)
MCV: 97.2 fL (ref 78.0–100.0)
Monocytes Absolute: 0.9 10*3/uL (ref 0.1–1.0)
Monocytes Relative: 9.7 % (ref 3.0–12.0)
Neutro Abs: 5.2 10*3/uL (ref 1.4–7.7)
Neutrophils Relative %: 56 % (ref 43.0–77.0)
Platelets: 380 10*3/uL (ref 150.0–400.0)
RBC: 3.82 Mil/uL — ABNORMAL LOW (ref 3.87–5.11)
RDW: 13.8 % (ref 11.5–15.5)
WBC: 9.2 10*3/uL (ref 4.0–10.5)

## 2022-11-15 LAB — COMPREHENSIVE METABOLIC PANEL
ALT: 9 U/L (ref 0–35)
AST: 11 U/L (ref 0–37)
Albumin: 4.4 g/dL (ref 3.5–5.2)
Alkaline Phosphatase: 71 U/L (ref 39–117)
BUN: 22 mg/dL (ref 6–23)
CO2: 29 meq/L (ref 19–32)
Calcium: 10 mg/dL (ref 8.4–10.5)
Chloride: 101 meq/L (ref 96–112)
Creatinine, Ser: 0.93 mg/dL (ref 0.40–1.20)
GFR: 61.86 mL/min (ref >60.00)
Glucose, Bld: 90 mg/dL (ref 70–99)
Potassium: 4.9 meq/L (ref 3.5–5.1)
Sodium: 140 meq/L (ref 135–145)
Total Bilirubin: 0.5 mg/dL (ref 0.2–1.2)
Total Protein: 6.9 g/dL (ref 6.0–8.3)

## 2022-11-15 LAB — LIPID PANEL
Cholesterol: 131 mg/dL (ref 0–200)
HDL: 39.2 mg/dL (ref >39.00)
LDL Cholesterol: 67 mg/dL (ref 0–99)
NonHDL: 91.51
Total CHOL/HDL Ratio: 3
Triglycerides: 122 mg/dL (ref 0.0–149.0)
VLDL: 24.4 mg/dL (ref 0.0–40.0)

## 2022-11-15 LAB — VITAMIN D 25 HYDROXY (VIT D DEFICIENCY, FRACTURES): VITD: 42.53 ng/mL (ref 30.00–100.00)

## 2022-11-15 LAB — VITAMIN B12: Vitamin B-12: 993 pg/mL — ABNORMAL HIGH (ref 211–911)

## 2022-11-15 LAB — TSH: TSH: 0.7 u[IU]/mL (ref 0.35–5.50)

## 2022-11-15 MED ORDER — CITALOPRAM HYDROBROMIDE 40 MG PO TABS: 40.0000 mg | ORAL_TABLET | Freq: Every day | ORAL | 1 refills | Status: DC

## 2022-11-15 MED ORDER — PRAVASTATIN SODIUM 40 MG PO TABS: 40.0000 mg | ORAL_TABLET | Freq: Every day | ORAL | 3 refills | Status: AC

## 2022-11-15 MED ORDER — TIZANIDINE HCL 4 MG PO CAPS: 4.0000 mg | ORAL_CAPSULE | Freq: Three times a day (TID) | ORAL | 2 refills | Status: DC | PRN

## 2022-11-15 NOTE — Assessment & Plan Note (Signed)
Pt to start cpap soon

## 2022-11-15 NOTE — Assessment & Plan Note (Signed)
Ghm utd Check labs  See AVS  Health Maintenance  Topic Date Due   Zoster Vaccines- Shingrix (1 of 2) [birth year]/1970   Lung Cancer Screening  10/20/2021   Medicare Annual Wellness (AWV)  11/16/2022   COVID-19 Vaccine (5 - 2023-24 season) 12/01/2022 (Originally 09/08/2022)   MAMMOGRAM  05/31/2023   Colonoscopy  08/26/2025   DTaP/Tdap/Td (3 - Td or Tdap) 01/13/2030   Pneumonia Vaccine 55+ Years old  Completed   INFLUENZA VACCINE  Completed   DEXA SCAN  Completed   Hepatitis C Screening  Completed   HPV VACCINES  Aged Out

## 2022-11-15 NOTE — Patient Instructions (Signed)
Preventive Care 65 Years and Older, Female Preventive care refers to lifestyle choices and visits with your health care provider that can promote health and wellness. Preventive care visits are also called wellness exams. What can I expect for my preventive care visit? Counseling Your health care provider may ask you questions about your: Medical history, including: Past medical problems. Family medical history. Pregnancy and menstrual history. History of falls. Current health, including: Memory and ability to understand (cognition). Emotional well-being. Home life and relationship well-being. Sexual activity and sexual health. Lifestyle, including: Alcohol, nicotine or tobacco, and drug use. Access to firearms. Diet, exercise, and sleep habits. Work and work environment. Sunscreen use. Safety issues such as seatbelt and bike helmet use. Physical exam Your health care provider will check your: Height and weight. These may be used to calculate your BMI (body mass index). BMI is a measurement that tells if you are at a healthy weight. Waist circumference. This measures the distance around your waistline. This measurement also tells if you are at a healthy weight and may help predict your risk of certain diseases, such as type 2 diabetes and high blood pressure. Heart rate and blood pressure. Body temperature. Skin for abnormal spots. What immunizations do I need?  Vaccines are usually given at various ages, according to a schedule. Your health care provider will recommend vaccines for you based on your age, medical history, and lifestyle or other factors, such as travel or where you work. What tests do I need? Screening Your health care provider may recommend screening tests for certain conditions. This may include: Lipid and cholesterol levels. Hepatitis C test. Hepatitis B test. HIV (human immunodeficiency virus) test. STI (sexually transmitted infection) testing, if you are at  risk. Lung cancer screening. Colorectal cancer screening. Diabetes screening. This is done by checking your blood sugar (glucose) after you have not eaten for a while (fasting). Mammogram. Talk with your health care provider about how often you should have regular mammograms. BRCA-related cancer screening. This may be done if you have a family history of breast, ovarian, tubal, or peritoneal cancers. Bone density scan. This is done to screen for osteoporosis. Talk with your health care provider about your test results, treatment options, and if necessary, the need for more tests. Follow these instructions at home: Eating and drinking  Eat a diet that includes fresh fruits and vegetables, whole grains, lean protein, and low-fat dairy products. Limit your intake of foods with high amounts of sugar, saturated fats, and salt. Take vitamin and mineral supplements as recommended by your health care provider. Do not drink alcohol if your health care provider tells you not to drink. If you drink alcohol: Limit how much you have to 0-1 drink a day. Know how much alcohol is in your drink. In the [place] one drink equals one 12 oz bottle of beer (355 mL), one 5 oz glass of wine (148 mL), or one 1 oz glass of hard liquor (44 mL). Lifestyle Brush your teeth every morning and night with fluoride toothpaste. Floss one time each day. Exercise for at least 30 minutes 5 or more days each week. Do not use any products that contain nicotine or tobacco. These products include cigarettes, chewing tobacco, and vaping devices, such as e-cigarettes. If you need help quitting, ask your health care provider. Do not use drugs. If you are sexually active, practice safe sex. Use a condom or other form of protection in order to prevent STIs. Take aspirin only as told by   your health care provider. Make sure that you understand how much to take and what form to take. Work with your health care provider to find out whether it  is safe and beneficial for you to take aspirin daily. Ask your health care provider if you need to take a cholesterol-lowering medicine (statin). Find healthy ways to manage stress, such as: Meditation, yoga, or listening to music. Journaling. Talking to a trusted person. Spending time with friends and family. Minimize exposure to UV radiation to reduce your risk of skin cancer. Safety Always wear your seat belt while driving or riding in a vehicle. Do not drive: If you have been drinking alcohol. Do not ride with someone who has been drinking. When you are tired or distracted. While texting. If you have been using any mind-altering substances or drugs. Wear a helmet and other protective equipment during sports activities. If you have firearms in your house, make sure you follow all gun safety procedures. What's next? Visit your health care provider once a year for an annual wellness visit. Ask your health care provider how often you should have your eyes and teeth checked. Stay up to date on all vaccines. This information is not intended to replace advice given to you by your health care provider. Make sure you discuss any questions you have with your health care provider. Document Revised: 06/21/2020 Document Reviewed: 06/21/2020 Elsevier Patient Education  2024 [employer]  

## 2022-11-15 NOTE — Progress Notes (Signed)
Established Patient Office Visit  Subjective   Patient ID: Jasmine Mclaughlin, female    DOB: Sep 25, 1950  Age: 72 y.o. MRN: [id number]  Chief Complaint  Patient presents with   Annual Exam    Pt states fasting     HPI Discussed the use of AI scribe software for clinical note transcription with the patient, who gave verbal consent to proceed.  History of Present Illness   The patient, with a history of thyroid nodules, presented for a follow-up consultation after a recent ultrasound. They reported no new symptoms or health issues since the last visit. The patient expressed concern about the delay in receiving the ultrasound results and was reassured that the delay was due to a shortage of radiologists, not a medical concern.  The patient is currently on a regimen of tizanidine and citalopram, and recently had a discussion with their pharmacy about potential side effects of their statin medication, simvastatin, with their heart medication, diltiazem. After discussing this with the provider, the decision was made to switch from simvastatin to pravastatin.  They recently underwent a sleep study, which revealed mild sleep apnea. They are scheduled to receive a CPAP machine in the near future, with the hope that it may improve their sleep quality and potentially lower their blood pressure.  In addition to these concerns, the patient recently had cataract surgery in both eyes, which has improved their vision significantly. They also started using hearing aids a few weeks ago, which they are still adjusting to. The patient is a former smoker, consuming half a pack a day, but has since quit. They also reported a recent CT scan that revealed three nodules on their right lung.  The patient is generally active, engaging in regular walking for exercise. They have no new moles or skin changes of concern. They have also been considering a shingles vaccine.      Patient Active Problem List   Diagnosis Date  Noted   Thyroid nodule 11/15/2022   Morbid obesity (HCC) 11/15/2022   B12 deficiency 11/15/2022   Muscle spasm 11/15/2022   Preventative health care 11/13/2021   Acute non-recurrent pansinusitis 08/30/2021   Acute bronchitis with COPD (HCC) 08/30/2021   Depression with anxiety 05/10/2021   Major depressive disorder    Palpitations 11/28/2020   Hyperlipidemia 08/08/2020   History of COVID-19 08/2020   Primary hypertension 12/23/2019   Vitamin D deficiency 12/23/2019   Atypical chest pain 08/25/2019   Pneumonia    Osteoporosis    Genital warts    Tubular adenoma of colon 11/18/2017   Allergic rhinitis 01/24/2015   COPD (chronic obstructive pulmonary disease) 11/22/2013   Generalized anxiety disorder 11/13/2012   Degenerative disc disease, lumbar 10/14/2012   Obesity (BMI 30-39.9) 10/14/2012   Osteoarthritis 09/28/2010   Tobacco abuse 09/28/2010   Past Medical History:  Diagnosis Date   Allergic rhinitis 01/24/2015   Allergy    Asthma    Atypical chest pain 08/25/2019   Chest tightness 10/27/2013   COPD (chronic obstructive pulmonary disease)    Degenerative disc disease, lumbar 10/14/2012   Generalized anxiety disorder 11/13/2012   Genital warts    Hearing loss    History of COVID-19 08/2020   Hyperlipidemia 08/08/2020   Major depressive disorder    Obesity (BMI 30-39.9) 10/14/2012   Osteoarthritis 09/28/2010   Osteoporosis    Palpitations 11/28/2020   Pneumonia    Primary hypertension 12/23/2019   Shortness of breath    Sinusitis, acute 10/27/2013  Tobacco abuse 09/28/2010   Tubular adenoma of colon 11/18/2017   Vitamin D deficiency 12/23/2019   Wheezing 10/27/2013   Past Surgical History:  Procedure Laterality Date   ANTERIOR FUSION LUMBAR SPINE  05/06/2012   ANTERIOR LAT LUMBAR FUSION N/A 05/06/2012   Procedure: ANTERIOR LATERAL LUMBAR FUSION 3 LEVELS;  Surgeon: Emilee Hero, MD;  Location: [facility] OR;  Service: Orthopedics;  Laterality: N/A;  Lateral  interbody fusion, lumbar 2-3,lumbar 3-4, lumbar 4-5 with instrumentation and allograft.   CATARACT EXTRACTION Left 08/2022   mincey   CATARACT EXTRACTION Right 09/2022   mincey   COLONOSCOPY     COLONOSCOPY WITH PROPOFOL N/A 12/15/2017   Procedure: COLONOSCOPY WITH PROPOFOL;  Surgeon: Meridee Score Netty Starring., MD;  Location: [facility] ENDOSCOPY;  Service: Gastroenterology;  Laterality: N/A;   COLONOSCOPY WITH PROPOFOL N/A 07/08/2018   Procedure: COLONOSCOPY WITH PROPOFOL;  Surgeon: Meridee Score Netty Starring., MD;  Location: [facility] ENDOSCOPY;  Service: Gastroenterology;  Laterality: N/A;   COLONOSCOPY WITH PROPOFOL N/A 09/01/2019   Procedure: COLONOSCOPY WITH PROPOFOL;  Surgeon: Meridee Score Netty Starring., MD;  Location: [facility] ENDOSCOPY;  Service: Gastroenterology;  Laterality: N/A;   KNEE ARTHROSCOPY Right     R knee 01/2012, for ligament tears   POLYPECTOMY  07/08/2018   Procedure: POLYPECTOMY;  Surgeon: Mansouraty, Netty Starring., MD;  Location: [facility] ENDOSCOPY;  Service: Gastroenterology;;   POLYPECTOMY  09/01/2019   Procedure: POLYPECTOMY;  Surgeon: Lemar Lofty., MD;  Location: [facility] ENDOSCOPY;  Service: Gastroenterology;;   TONSILLECTOMY     VAGINAL DELIVERY     x1   Social History   Tobacco Use   Smoking status: Former    Current packs/day: 0.00    Average packs/day: 0.5 packs/day for 55.0 years (27.5 ttl pk-yrs)    Types: Cigarettes    Start date: 09/20/1966    Quit date: 09/19/2021    Years since quitting: 1.1   Smokeless tobacco: Never  Vaping Use   Vaping status: Never Used  Substance Use Topics   Alcohol use: Yes    Alcohol/week: 1.0 standard drink of alcohol    Types: 1 Standard drinks or equivalent per week   Drug use: No   Social History   Socioeconomic History   Marital status: Single    Spouse name: Not on file   Number of children: 1   Years of education: 12   Highest education level: Some college, no degree  Occupational History   Occupation: Retired    Comment:  Audiological scientist  Tobacco Use   Smoking status: Former    Current packs/day: 0.00    Average packs/day: 0.5 packs/day for 55.0 years (27.5 ttl pk-yrs)    Types: Cigarettes    Start date: 09/20/1966    Quit date: 09/19/2021    Years since quitting: 1.1   Smokeless tobacco: Never  Vaping Use   Vaping status: Never Used  Substance and Sexual Activity   Alcohol use: Yes    Alcohol/week: 1.0 standard drink of alcohol    Types: 1 Standard drinks or equivalent per week   Drug use: No   Sexual activity: Not Currently    Partners: Male  Other Topics Concern   Not on file  Social History Narrative   1 caffeine drinks daily   No exercise   Left handed   One story home   Social Determinants of Health   Financial Resource Strain: Patient Declined (06/19/2022)   Overall Financial Resource Strain (CARDIA)    Difficulty of Paying Living Expenses: Patient declined  Food Insecurity: Low Risk  (11/07/2022)   Received from [facility]   Hunger Vital Sign    Worried About Running Out of Food in the Last Year: Never true    Ran Out of Food in the Last Year: Never true  Transportation Needs: No Transportation Needs (11/07/2022)   Received from [facility]    In the past 12 months, has lack of reliable transportation kept you from medical appointments, meetings, work or from getting things needed for daily living? : No  Physical Activity: Insufficiently Active (06/19/2022)   Exercise Vital Sign    Days of Exercise per Week: 1 day    Minutes of Exercise per Session: 10 min  Stress: Stress Concern Present (06/19/2022)   [employer] of Occupational Health - Occupational Stress Questionnaire    Feeling of Stress : Rather much  Social Connections: Unknown (06/19/2022)   Social Connection and Isolation Panel [NHANES]    Frequency of Communication with Friends and Family: Patient declined    Frequency of Social Gatherings with Friends and Family: Patient declined    Attends  Religious Services: Patient declined    Database administrator or Organizations: No    Attends Banker Meetings: Never    Marital Status: Divorced  Catering manager Violence: Not At Risk (11/09/2020)   Humiliation, Afraid, Rape, and Kick questionnaire    Fear of Current or Ex-Partner: No    Emotionally Abused: No    Physically Abused: No    Sexually Abused: No   Family Status  Relation Name Status   Mother  Deceased at age 65   Father  Deceased   MGM  (Not Specified)   Daughter  (Not Specified)   Neg Hx  (Not Specified)  No partnership data on file   Family History  Problem Relation Age of Onset   Arthritis Mother    Atrial fibrillation Mother    Arthritis Father    Stroke Father    Colon cancer Father 83   Heart disease Father 63       chf   Breast cancer Maternal Grandmother    Asthma Maternal Grandmother    Asthma Daughter    Esophageal cancer Neg Hx    Ulcerative colitis Neg Hx    Stomach cancer Neg Hx    Colon polyps Neg Hx    Rectal cancer Neg Hx    No Known Allergies    Review of Systems  Constitutional:  Negative for chills, fever and malaise/fatigue.  HENT:  Negative for congestion and hearing loss.   Eyes:  Negative for blurred vision and discharge.  Respiratory:  Negative for cough, sputum production and shortness of breath.   Cardiovascular:  Negative for chest pain, palpitations and leg swelling.  Gastrointestinal:  Negative for abdominal pain, blood in stool, constipation, diarrhea, heartburn, nausea and vomiting.  Genitourinary:  Negative for dysuria, frequency, hematuria and urgency.  Musculoskeletal:  Negative for back pain, falls and myalgias.  Skin:  Negative for rash.  Neurological:  Negative for dizziness, sensory change, loss of consciousness, weakness and headaches.  Endo/Heme/Allergies:  Negative for environmental allergies. Does not bruise/bleed easily.  Psychiatric/Behavioral:  Negative for depression and suicidal ideas. The  patient is not nervous/anxious and does not have insomnia.       Objective:     BP 110/60 (BP Location: Right Arm, Patient Position: Sitting, Cuff Size: Large)   Pulse 69   Temp 98.7 F (37.1 C) (Oral)   Resp  18   Ht 5' 2.5" (1.588 m)   Wt 222 lb 6.4 oz (100.9 kg)   SpO2 95%   BMI 40.03 kg/m  BP Readings from Last 3 Encounters:  11/15/22 110/60  08/27/22 128/64  08/07/22 112/60   Wt Readings from Last 3 Encounters:  11/15/22 222 lb 6.4 oz (100.9 kg)  08/27/22 215 lb (97.5 kg)  08/07/22 221 lb (100.2 kg)   SpO2 Readings from Last 3 Encounters:  11/15/22 95%  08/27/22 97%  08/07/22 96%      Physical Exam Vitals and nursing note reviewed.  Constitutional:      General: She is not in acute distress.    Appearance: Normal appearance. She is well-developed.  HENT:     Head: Normocephalic and atraumatic.     Right Ear: Tympanic membrane, ear canal and external ear normal. There is no impacted cerumen.     Left Ear: Tympanic membrane, ear canal and external ear normal. There is no impacted cerumen.     Nose: Nose normal.     Mouth/Throat:     Mouth: Mucous membranes are moist.     Pharynx: Oropharynx is clear. No oropharyngeal exudate or posterior oropharyngeal erythema.  Eyes:     General: No scleral icterus.       Right eye: No discharge.        Left eye: No discharge.     Conjunctiva/sclera: Conjunctivae normal.     Pupils: Pupils are equal, round, and reactive to light.  Neck:     Thyroid: No thyromegaly or thyroid tenderness.     Vascular: No JVD.  Cardiovascular:     Rate and Rhythm: Normal rate and regular rhythm.     Heart sounds: Normal heart sounds. No murmur heard. Pulmonary:     Effort: Pulmonary effort is normal. No respiratory distress.     Breath sounds: Normal breath sounds.  Abdominal:     General: Bowel sounds are normal. There is no distension.     Palpations: Abdomen is soft. There is no mass.     Tenderness: There is no abdominal  tenderness. There is no guarding or rebound.  Genitourinary:    Vagina: Normal.  Musculoskeletal:        General: Normal range of motion.     Cervical back: Normal range of motion and neck supple.     Right lower leg: No edema.     Left lower leg: No edema.  Lymphadenopathy:     Cervical: No cervical adenopathy.  Skin:    General: Skin is warm and dry.     Findings: No erythema or rash.  Neurological:     Mental Status: She is alert and oriented to person, place, and time.     Cranial Nerves: No cranial nerve deficit.     Deep Tendon Reflexes: Reflexes are normal and symmetric.  Psychiatric:        Mood and Affect: Mood normal.        Behavior: Behavior normal.        Thought Content: Thought content normal.        Judgment: Judgment normal.      No results found for any visits on 11/15/22.  Last CBC Lab Results  Component Value Date   WBC 8.7 01/23/2022   HGB 12.6 01/23/2022   HCT 37.4 01/23/2022   MCV 98.4 01/23/2022   MCH 34.0 (H) 09/23/2019   RDW 13.5 01/23/2022   PLT 354.0 01/23/2022   Last metabolic panel Lab  Results  Component Value Date   GLUCOSE 83 11/13/2021   NA 137 11/13/2021   K 4.3 11/13/2021   CL 98 11/13/2021   CO2 31 11/13/2021   BUN 27 (H) 11/13/2021   CREATININE 0.84 11/13/2021   GFR 70.40 11/13/2021   CALCIUM 10.2 11/13/2021   PROT 6.7 11/13/2021   ALBUMIN 4.3 11/13/2021   BILITOT 0.4 11/13/2021   ALKPHOS 49 11/13/2021   AST 10 11/13/2021   ALT 15 11/13/2021   Last lipids Lab Results  Component Value Date   CHOL 119 11/13/2021   HDL 48.10 11/13/2021   LDLCALC 43 11/13/2021   LDLDIRECT 135.0 12/23/2019   TRIG 140.0 11/13/2021   CHOLHDL 2 11/13/2021   Last hemoglobin A1c No results found for: "HGBA1C" Last thyroid functions Lab Results  Component Value Date   TSH 0.95 09/23/2019   Last vitamin D Lab Results  Component Value Date   VD25OH 58.86 11/10/2020   Last vitamin B12 and Folate Lab Results  Component Value Date    VITAMINB12 299 09/23/2019      The ASCVD Risk score (Arnett DK, et al., 2019) failed to calculate for the following reasons:   The valid total cholesterol range is 130 to 320 mg/dL    Assessment & Plan:   Problem List Items Addressed This Visit       Unprioritized   Vitamin D deficiency   Relevant Orders   VITAMIN D 25 Hydroxy (Vit-D Deficiency, Fractures)   Depression with anxiety   Relevant Medications   citalopram (CELEXA) 40 MG tablet   Primary hypertension   Relevant Medications   pravastatin (PRAVACHOL) 40 MG tablet   Other Relevant Orders   CBC with Differential/Platelet   Comprehensive metabolic panel   Hyperlipidemia   Relevant Medications   pravastatin (PRAVACHOL) 40 MG tablet   Other Relevant Orders   Comprehensive metabolic panel   Lipid panel   Thyroid nodule   Relevant Orders   TSH   Preventative health care - Primary    Ghm utd Check labs  See AVS  Health Maintenance  Topic Date Due   Zoster Vaccines- Shingrix (1 of 2) 06/05/1970   Lung Cancer Screening  10/20/2021   Medicare Annual Wellness (AWV)  11/16/2022   COVID-19 Vaccine (5 - 2023-24 season) 12/01/2022 (Originally 09/08/2022)   MAMMOGRAM  05/31/2023   Colonoscopy  08/26/2025   DTaP/Tdap/Td (3 - Td or Tdap) 01/13/2030   Pneumonia Vaccine 77+ Years old  Completed   INFLUENZA VACCINE  Completed   DEXA SCAN  Completed   Hepatitis C Screening  Completed   HPV VACCINES  Aged Out         Muscle spasm   Relevant Medications   tiZANidine (ZANAFLEX) 4 MG capsule   Morbid obesity (HCC)   B12 deficiency   Relevant Orders   Vitamin B12   Other Visit Diagnoses     Need for influenza vaccination       Relevant Orders   Flu Vaccine Trivalent High Dose (Fluad) (Completed)     Assessment and Plan    Thyroid Nodule Awaiting thyroid ultrasound results, delayed by a national radiologist shortage. [name] will call the radiologist to expedite the reading.  Hyperlipidemia They are on  simvastatin, which interacts with diltiazem. We will switch to pravastatin due to fewer interactions and cost-effectiveness, despite its weaker effect requiring a higher dose. We will discontinue simvastatin, prescribe pravastatin, send the prescription to [facility] in [place], and order labs today with a repeat in  2-3 months to monitor efficacy.  Sleep Apnea They have mild sleep apnea and are scheduled for a CPAP machine fitting next week. We discussed the benefits, including improved sleep quality and potential blood pressure reduction, and explained the adjustment period.  Cataracts (Post-Surgery) They had cataract surgery on the left eye in August and the right eye in mid-September, with satisfactory results. We will document the surgery and outcomes.  Hearing Loss They were recently fitted with hearing aids from [facility] and are adjusting to their use, especially in social settings. We will document hearing aid usage and the adjustment period.  Medication Management Refills are needed for citalopram and tizanidine. They take vitamin D3 and sublingual B12 and want to check levels to determine if continuation is necessary. We will refill citalopram and tizanidine and check vitamin D3 and B12 levels during today's labs.  General Health Maintenance They are due for a flu shot today, received the RSV shot last year, and we discussed the new shingles vaccine, which is more effective and reduces the risk of heart attack and stroke. They are due for lung cancer screening; a recent CT scan showed three nodules on the bottom right lung. We will administer the flu shot today, recommend getting the new shingles vaccine at the pharmacy, and ensure lung cancer screening follow-up.  Follow-up Ensure follow-up with pulmonologist.        Return in about 6 months (around 05/15/2023), or if symptoms worsen or fail to improve.    Donato Schultz, DO

## 2022-11-16 ENCOUNTER — Other Ambulatory Visit: Payer: Self-pay | Admitting: Family Medicine

## 2022-11-16 DIAGNOSIS — M810 Age-related osteoporosis without current pathological fracture: Secondary | ICD-10-CM

## 2022-11-18 ENCOUNTER — Encounter: Payer: Self-pay | Admitting: Family Medicine

## 2022-11-18 ENCOUNTER — Other Ambulatory Visit: Payer: Self-pay | Admitting: Family Medicine

## 2022-11-18 DIAGNOSIS — E041 Nontoxic single thyroid nodule: Secondary | ICD-10-CM

## 2022-11-20 ENCOUNTER — Ambulatory Visit (INDEPENDENT_AMBULATORY_CARE_PROVIDER_SITE_OTHER): Payer: Medicare HMO | Admitting: *Deleted

## 2022-11-20 DIAGNOSIS — Z0000 Encounter for general adult medical examination without abnormal findings: Secondary | ICD-10-CM | POA: Diagnosis not present

## 2022-11-20 DIAGNOSIS — I10 Essential (primary) hypertension: Secondary | ICD-10-CM | POA: Diagnosis not present

## 2022-11-20 DIAGNOSIS — G4733 Obstructive sleep apnea (adult) (pediatric): Secondary | ICD-10-CM | POA: Diagnosis not present

## 2022-11-20 NOTE — Progress Notes (Signed)
Subjective:   Jasmine Mclaughlin is a 72 y.o. female who presents for Medicare Annual (Subsequent) preventive examination.  Visit Complete: Virtual I connected with  Jasmine Mclaughlin on 11/20/22 by a audio enabled telemedicine application and verified that I am speaking with the correct person using two identifiers.  Patient Location: Home  Provider Location: Office/Clinic  I discussed the limitations of evaluation and management by telemedicine. The patient expressed understanding and agreed to proceed.  Vital Signs: Because this visit was a virtual/telehealth visit, some criteria may be missing or patient reported. Any vitals not documented were not able to be obtained and vitals that have been documented are patient reported.  Patient Medicare AWV questionnaire was completed by the patient on 11/19/22; I have confirmed that all information answered by patient is correct and no changes since this date.  Cardiac Risk Factors include: advanced age (>57men, >35 women);dyslipidemia;hypertension;obesity (BMI >30kg/m2)     Objective:    There were no vitals filed for this visit. There is no height or weight on file to calculate BMI.     11/20/2022    2:22 PM 11/15/2021   11:02 AM 01/05/2021   10:28 AM 11/09/2020   10:25 AM 03/13/2020   10:55 AM 09/09/2019    8:55 AM 09/01/2019   10:14 AM  Advanced Directives  Does Patient Have a Medical Advance Directive? No No No No No No No  Would patient like information on creating a medical advance directive? No - Patient declined No - Patient declined  No - Patient declined  No - Patient declined No - Patient declined    Current Medications (verified) Outpatient Encounter Medications as of 11/20/2022  Medication Sig   acetaminophen (TYLENOL) 650 MG CR tablet Take 650 mg by mouth 2 times daily at 12 noon and 4 pm. Patient takes 2 tablets twice a day   albuterol (PROVENTIL) (2.5 MG/3ML) 0.083% nebulizer solution Take 3 mLs (2.5 mg total) by nebulization  every 4 (four) hours as needed for wheezing or shortness of breath.   albuterol (VENTOLIN HFA) 108 (90 Base) MCG/ACT inhaler Inhale 2 puffs into the lungs every 6 (six) hours as needed for wheezing or shortness of breath.   alendronate (FOSAMAX) 70 MG tablet TAKE 1 TABLET (70 MG TOTAL) BY MOUTH EVERY 7 DAYS WITH FULL GLASS WATER ON EMPTY STOMACH   aspirin EC 81 MG tablet Take 81 mg by mouth daily. Swallow whole.   azelastine (ASTELIN) 0.1 % nasal spray Place 2 sprays into both nostrils 2 (two) times daily. Use in each nostril as directed   BREO ELLIPTA 200-25 MCG/ACT AEPB Inhale 1 puff into the lungs daily.   calcium-vitamin D (OSCAL WITH D) 500-5 MG-MCG tablet Take 1 tablet by mouth.   chlorpheniramine (CHLOR-TRIMETON) 4 MG tablet Take 4 mg by mouth daily as needed for allergies.   cholecalciferol 25 MCG (1000 UT) tablet Take 1,000 Units by mouth daily.   citalopram (CELEXA) 40 MG tablet Take 1 tablet (40 mg total) by mouth daily.   co-enzyme Q-10 30 MG capsule Take 30 mg by mouth 3 (three) times daily.   dilTIAZem HCl ER 300 MG TB24 Take 300 mg by mouth daily.   diphenhydrAMINE (SOMINEX) 25 MG tablet Take 25 mg by mouth at bedtime.   fluticasone (FLONASE) 50 MCG/ACT nasal spray SPRAY 2 SPRAYS INTO EACH NOSTRIL EVERY DAY (Patient not taking: Reported on 08/27/2022)   gabapentin (NEURONTIN) 100 MG capsule Take 2 capsules (200 mg total) by mouth in  the morning.   gabapentin (NEURONTIN) 300 MG capsule Take 1 capsule (300 mg total) by mouth at bedtime.   hydrochlorothiazide (MICROZIDE) 12.5 MG capsule TAKE 1 CAPSULE BY MOUTH EVERY DAY   ipratropium (ATROVENT) 0.02 % nebulizer solution Take 2.5 mLs (0.5 mg total) by nebulization 4 (four) times daily.   isosorbide mononitrate (IMDUR) 30 MG 24 hr tablet Take 0.5 tablets (15 mg total) by mouth daily.   montelukast (SINGULAIR) 10 MG tablet Take 1 tablet (10 mg total) by mouth at bedtime.   nitroGLYCERIN (NITROSTAT) 0.4 MG SL tablet Place 1 tablet (0.4 mg  total) under the tongue every 5 (five) minutes as needed for chest pain. (Patient not taking: Reported on 08/27/2022)   pravastatin (PRAVACHOL) 40 MG tablet Take 1 tablet (40 mg total) by mouth daily.   tiZANidine (ZANAFLEX) 4 MG capsule Take 1 capsule (4 mg total) by mouth 3 (three) times daily as needed for muscle spasms.   traMADol (ULTRAM) 50 MG tablet Take 50 mg by mouth 2 (two) times daily as needed. (Patient not taking: Reported on 08/27/2022)   vitamin B-12 (CYANOCOBALAMIN) 1000 MCG tablet Take 1,000 mcg by mouth daily.   No facility-administered encounter medications on file as of 11/20/2022.    Allergies (verified) Patient has no known allergies.   History: Past Medical History:  Diagnosis Date   Allergic rhinitis 01/24/2015   Allergy    Asthma    Atypical chest pain 08/25/2019   Chest tightness 10/27/2013   COPD (chronic obstructive pulmonary disease)    Degenerative disc disease, lumbar 10/14/2012   Generalized anxiety disorder 11/13/2012   Genital warts    Hearing loss    History of COVID-19 08/2020   Hyperlipidemia 08/08/2020   Major depressive disorder    Neuromuscular disorder (HCC)    Obesity (BMI 30-39.9) 10/14/2012   Osteoarthritis 09/28/2010   Osteoporosis    Palpitations 11/28/2020   Pneumonia    Primary hypertension 12/23/2019   Shortness of breath    Sinusitis, acute 10/27/2013   Sleep apnea    Thyroid disease    Tobacco abuse 09/28/2010   Tubular adenoma of colon 11/18/2017   Vitamin D deficiency 12/23/2019   Wheezing 10/27/2013   Past Surgical History:  Procedure Laterality Date   ANTERIOR FUSION LUMBAR SPINE  05/06/2012   ANTERIOR LAT LUMBAR FUSION N/A 05/06/2012   Procedure: ANTERIOR LATERAL LUMBAR FUSION 3 LEVELS;  Surgeon: Emilee Hero, MD;  Location: [facility] OR;  Service: Orthopedics;  Laterality: N/A;  Lateral interbody fusion, lumbar 2-3,lumbar 3-4, lumbar 4-5 with instrumentation and allograft.   CATARACT EXTRACTION Left 08/2022    mincey   CATARACT EXTRACTION Right 09/2022   mincey   COLONOSCOPY     COLONOSCOPY WITH PROPOFOL N/A 12/15/2017   Procedure: COLONOSCOPY WITH PROPOFOL;  Surgeon: Meridee Score Netty Starring., MD;  Location: [facility] ENDOSCOPY;  Service: Gastroenterology;  Laterality: N/A;   COLONOSCOPY WITH PROPOFOL N/A 07/08/2018   Procedure: COLONOSCOPY WITH PROPOFOL;  Surgeon: Meridee Score Netty Starring., MD;  Location: [facility] ENDOSCOPY;  Service: Gastroenterology;  Laterality: N/A;   COLONOSCOPY WITH PROPOFOL N/A 09/01/2019   Procedure: COLONOSCOPY WITH PROPOFOL;  Surgeon: Meridee Score Netty Starring., MD;  Location: [facility] ENDOSCOPY;  Service: Gastroenterology;  Laterality: N/A;   EYE SURGERY     KNEE ARTHROSCOPY Right     R knee 01/2012, for ligament tears   POLYPECTOMY  07/08/2018   Procedure: POLYPECTOMY;  Surgeon: Mansouraty, Netty Starring., MD;  Location: [facility] ENDOSCOPY;  Service: Gastroenterology;;   POLYPECTOMY  09/01/2019  Procedure: POLYPECTOMY;  Surgeon: Mansouraty, Netty Starring., MD;  Location: [facility] ENDOSCOPY;  Service: Gastroenterology;;   SPINE SURGERY     TONSILLECTOMY     TUBAL LIGATION     VAGINAL DELIVERY     x1   Family History  Problem Relation Age of Onset   Arthritis Mother    Atrial fibrillation Mother    Hypertension Mother    Arthritis Father    Stroke Father    Colon cancer Father 52   Heart disease Father 3       chf   Breast cancer Maternal Grandmother    Asthma Maternal Grandmother    Asthma Daughter    Esophageal cancer Neg Hx    Ulcerative colitis Neg Hx    Stomach cancer Neg Hx    Colon polyps Neg Hx    Rectal cancer Neg Hx    Social History   Socioeconomic History   Marital status: Single    Spouse name: Not on file   Number of children: 1   Years of education: 12   Highest education level: Some college, no degree  Occupational History   Occupation: Retired    Comment: Audiological scientist  Tobacco Use   Smoking status: Former    Current packs/day: 0.00    Average packs/day: 0.5  packs/day for 55.0 years (27.5 ttl pk-yrs)    Types: Cigarettes    Start date: 09/20/1966    Quit date: 09/19/2021    Years since quitting: 1.1   Smokeless tobacco: Never  Vaping Use   Vaping status: Never Used  Substance and Sexual Activity   Alcohol use: Yes    Alcohol/week: 1.0 standard drink of alcohol    Types: 1 Standard drinks or equivalent per week   Drug use: No   Sexual activity: Not Currently    Partners: Male  Other Topics Concern   Not on file  Social History Narrative   1 caffeine drinks daily   No exercise   Left handed   One story home   Social Determinants of Health   Financial Resource Strain: Medium Risk (11/19/2022)   Overall Financial Resource Strain (CARDIA)    Difficulty of Paying Living Expenses: Somewhat hard  Food Insecurity: Unknown (11/19/2022)   Hunger Vital Sign    Worried About Running Out of Food in the Last Year: Never true    Ran Out of Food in the Last Year: Patient declined  Transportation Needs: No Transportation Needs (11/19/2022)   PRAPARE - Administrator, Civil Service (Medical): No    Lack of Transportation (Non-Medical): No  Physical Activity: Patient Declined (11/19/2022)   Exercise Vital Sign    Days of Exercise per Week: Patient declined    Minutes of Exercise per Session: Patient declined  Stress: Stress Concern Present (11/19/2022)   [employer] of Occupational Health - Occupational Stress Questionnaire    Feeling of Stress : To some extent  Social Connections: Unknown (11/19/2022)   Social Connection and Isolation Panel [NHANES]    Frequency of Communication with Friends and Family: More than three times a week    Frequency of Social Gatherings with Friends and Family: Patient declined    Attends Religious Services: Patient declined    Database administrator or Organizations: No    Attends Banker Meetings: Never    Marital Status: Divorced    Tobacco Counseling Counseling given: Not  Answered   Clinical Intake:  Pre-visit preparation completed: Yes  Pain : No/denies  pain  BMI - recorded: 40.03 Nutritional Status: BMI > 30  Obese Nutritional Risks: None Diabetes: No  How often do you need to have someone help you when you read instructions, pamphlets, or other written materials from your doctor or pharmacy?: 1 - Never  Interpreter Needed?: No  Information entered by :: Donne Anon, CMA   Activities of Daily Living    11/19/2022   11:25 AM 11/16/2022    1:01 PM  In your present state of health, do you have any difficulty performing the following activities:  Hearing? 0 0  Vision? 0 0  Difficulty concentrating or making decisions? 0 0  Walking or climbing stairs? 1 1  Dressing or bathing? 0 0  Doing errands, shopping? 0 0  Preparing Food and eating ? N N  Using the Toilet? N N  In the past six months, have you accidently leaked urine? Y Y  Do you have problems with loss of bowel control? N N  Managing your Medications? N N  Managing your Finances? N N  Housekeeping or managing your Housekeeping? Malvin Johns    Patient Care Team: Zola Button, Grayling Congress, DO as PCP - General (Family Medicine) Thomasene Ripple, DO as PCP - Cardiology (Cardiology) Drema Dallas, DO as Consulting Physician (Neurology) Zola Button, Grayling Congress, DO as Consulting Physician (Family Medicine) Vincente Poli, MD (Pulmonary Disease)  Indicate any recent Medical Services you may have received from other than [facility] providers in the past year (date may be approximate).     Assessment:   This is a routine wellness examination for [place].  Hearing/Vision screen No results found.   Goals Addressed   None    Depression Screen    11/20/2022    2:23 PM 11/15/2022    9:43 AM 11/15/2021   11:05 AM 09/28/2021   10:48 AM 09/25/2021    1:39 PM 11/09/2020   10:29 AM 09/15/2020   10:17 AM  PHQ 2/9 Scores  PHQ - 2 Score 0 0 4 0 0 0 0  PHQ- 9 Score   9        Fall Risk     11/19/2022   11:25 AM 11/16/2022    1:01 PM 11/15/2022    9:43 AM 11/15/2021   11:02 AM 09/28/2021   10:48 AM  Fall Risk   Falls in the past year? 0 0 0 0 0  Number falls in past yr: 0 0 0 0 0  Injury with Fall? 0 0 0 0 0  Risk for fall due to : No Fall Risks   History of fall(s) No Fall Risks  Follow up Falls evaluation completed  Falls evaluation completed Falls evaluation completed Falls evaluation completed    MEDICARE RISK AT HOME: Medicare Risk at Home Any stairs in or around the home?: No If so, are there any without handrails?: No Home free of loose throw rugs in walkways, pet beds, electrical cords, etc?: Yes Adequate lighting in your home to reduce risk of falls?: Yes Life alert?: No Use of a cane, walker or w/c?: Yes Grab bars in the bathroom?: Yes Shower chair or bench in shower?: Yes Elevated toilet seat or a handicapped toilet?: No  TIMED UP AND GO:  Was the test performed?  No    Cognitive Function:    06/11/2016   10:46 AM  MMSE - Mini Mental State Exam  Orientation to time 5  Orientation to Place 5  Registration 3  Attention/ Calculation 5  Recall 3  Language- name 2 objects 2  Language- repeat 1  Language- follow 3 step command 3  Language- read & follow direction 1  Write a sentence 1  Copy design 1  Total score 30        11/20/2022    2:24 PM 11/15/2021   11:11 AM 11/09/2020   10:39 AM  6CIT Screen  What Year? 0 points 0 points 0 points  What month? 0 points 0 points 0 points  What time? 0 points 0 points 0 points  Count back from 20 0 points 0 points 0 points  Months in reverse 0 points 0 points 0 points  Repeat phrase 0 points 0 points 0 points  Total Score 0 points 0 points 0 points    Immunizations Immunization History  Administered Date(s) Administered   Fluad Quad(high Dose 65+) 09/22/2018, 09/23/2019, 11/10/2020, 11/13/2021   Fluad Trivalent(High Dose 65+) 11/15/2022   Influenza-Unspecified 11/14/2014   PFIZER(Purple  Top)SARS-COV-2 Vaccination 03/21/2019, 04/10/2019, 10/20/2019   Pfizer Covid-19 Vaccine Bivalent Booster 55yrs & up 12/18/2020   Pneumococcal Conjugate-13 08/29/2017   Pneumococcal Polysaccharide-23 02/15/2014, 09/22/2018   Respiratory Syncytial Virus Vaccine,Recomb Aduvanted(Arexvy) 10/09/2021   Tdap 08/29/2010, 01/14/2020   Zoster, Live 10/14/2012    TDAP status: Up to date  Flu Vaccine status: Up to date  Pneumococcal vaccine status: Up to date  Covid-19 vaccine status: Information provided on how to obtain vaccines.   Qualifies for Shingles Vaccine? Yes   Zostavax completed Yes   Shingrix Completed?: No.    Education has been provided regarding the importance of this vaccine. Patient has been advised to call insurance company to determine out of pocket expense if they have not yet received this vaccine. Advised may also receive vaccine at local pharmacy or Health Dept. Verbalized acceptance and understanding.  Screening Tests Health Maintenance  Topic Date Due   Zoster Vaccines- Shingrix (1 of 2) 06[birth year]05/1970   Lung Cancer Screening  10/20/2021   Medicare Annual Wellness (AWV)  11/16/2022   COVID-19 Vaccine (5 - 2023-24 season) 12/01/2022 (Originally 09/08/2022)   MAMMOGRAM  05/31/2023   Colonoscopy  08/26/2025   DTaP/Tdap/Td (3 - Td or Tdap) 01/13/2030   Pneumonia Vaccine 89+ Years old  Completed   INFLUENZA VACCINE  Completed   DEXA SCAN  Completed   Hepatitis C Screening  Completed   HPV VACCINES  Aged Out    Health Maintenance  Health Maintenance Due  Topic Date Due   Zoster Vaccines- Shingrix (1 of 2) 06[birth year]05/1970   Lung Cancer Screening  10/20/2021   Medicare Annual Wellness (AWV)  11/16/2022    Colorectal cancer screening: Type of screening: Colonoscopy. Completed 08/27/22. Repeat every 3 years  Mammogram status: Completed 05/31/22. Repeat every year  Bone Density status: Completed 04/19/21. Results reflect: Bone density results: OSTEOPOROSIS. Repeat every 2  years.  Lung Cancer Screening: (Low Dose CT Chest recommended if Age 49-80 years, 20 pack-year currently smoking OR have quit w/in 15years.) does qualify.   Lung Cancer Screening Referral: ordered by Kandice Robinsons, NP  Additional Screening:  Hepatitis C Screening: does qualify; Completed 06/11/16  Vision Screening: Recommended annual ophthalmology exams for early detection of glaucoma and other disorders of the eye. Is the patient up to date with their annual eye exam?  Yes  Who is the provider or what is the name of the office in which the patient attends annual eye exams? [facility] If pt is not established with a provider, would they like to  be referred to a provider to establish care? No .   Dental Screening: Recommended annual dental exams for proper oral hygiene  Diabetic Foot Exam: N/a  Community Resource Referral / Chronic Care Management: CRR required this visit?  No   CCM required this visit?  No     Plan:     I have personally reviewed and noted the following in the patient's chart:   Medical and social history Use of alcohol, tobacco or illicit drugs  Current medications and supplements including opioid prescriptions. Patient is not currently taking opioid prescriptions. Functional ability and status Nutritional status Physical activity Advanced directives List of other physicians Hospitalizations, surgeries, and ER visits in previous 12 months Vitals Screenings to include cognitive, depression, and falls Referrals and appointments  In addition, I have reviewed and discussed with patient certain preventive protocols, quality metrics, and best practice recommendations. A written personalized care plan for preventive services as well as general preventive health recommendations were provided to patient.     Donne Anon, CMA   11/20/2022   After Visit Summary: (MyChart) Due to this being a telephonic visit, the after visit summary with patients  personalized plan was offered to patient via MyChart   Nurse Notes: None

## 2022-11-20 NOTE — Patient Instructions (Signed)
[name] , Thank you for taking time to come for your Medicare Wellness Visit. I appreciate your ongoing commitment to your health goals. Please review the following plan we discussed and let me know if I can assist you in the future.     This is a list of the screening recommended for you and due dates:  Health Maintenance  Topic Date Due   Zoster (Shingles) Vaccine (1 of 2) [birth year]/1970   Screening for Lung Cancer  10/20/2021   COVID-19 Vaccine (5 - 2023-24 season) 12/01/2022*   Mammogram  05/31/2023   Medicare Annual Wellness Visit  11/20/2023   Colon Cancer Screening  08/26/2025   DTaP/Tdap/Td vaccine (3 - Td or Tdap) 01/13/2030   Pneumonia Vaccine  Completed   Flu Shot  Completed   DEXA scan (bone density measurement)  Completed   Hepatitis C Screening  Completed   HPV Vaccine  Aged Out  *Topic was postponed. The date shown is not the original due date.    Next appointment: Follow up in one year for your annual wellness visit.   Preventive Care 35 Years and Older, Female Preventive care refers to lifestyle choices and visits with your health care provider that can promote health and wellness. What does preventive care include? A yearly physical exam. This is also called an annual well check. Dental exams once or twice a year. Routine eye exams. Ask your health care provider how often you should have your eyes checked. Personal lifestyle choices, including: Daily care of your teeth and gums. Regular physical activity. Eating a healthy diet. Avoiding tobacco and drug use. Limiting alcohol use. Practicing safe sex. Taking low-dose aspirin every day. Taking vitamin and mineral supplements as recommended by your health care provider. What happens during an annual well check? The services and screenings done by your health care provider during your annual well check will depend on your age, overall health, lifestyle risk factors, and family history of disease. Counseling   Your health care provider may ask you questions about your: Alcohol use. Tobacco use. Drug use. Emotional well-being. Home and relationship well-being. Sexual activity. Eating habits. History of falls. Memory and ability to understand (cognition). Work and work Astronomer. Reproductive health. Screening  You may have the following tests or measurements: Height, weight, and BMI. Blood pressure. Lipid and cholesterol levels. These may be checked every 5 years, or more frequently if you are over 25 years old. Skin check. Lung cancer screening. You may have this screening every year starting at age 6 if you have a 30-pack-year history of smoking and currently smoke or have quit within the past 15 years. Fecal occult blood test (FOBT) of the stool. You may have this test every year starting at age 74. Flexible sigmoidoscopy or colonoscopy. You may have a sigmoidoscopy every 5 years or a colonoscopy every 10 years starting at age 38. Hepatitis C blood test. Hepatitis B blood test. Sexually transmitted disease (STD) testing. Diabetes screening. This is done by checking your blood sugar (glucose) after you have not eaten for a while (fasting). You may have this done every 1-3 years. Bone density scan. This is done to screen for osteoporosis. You may have this done starting at age 46. Mammogram. This may be done every 1-2 years. Talk to your health care provider about how often you should have regular mammograms. Talk with your health care provider about your test results, treatment options, and if necessary, the need for more tests. Vaccines  Your health  care provider may recommend certain vaccines, such as: Influenza vaccine. This is recommended every year. Tetanus, diphtheria, and acellular pertussis (Tdap, Td) vaccine. You may need a Td booster every 10 years. Zoster vaccine. You may need this after age 18. Pneumococcal 13-valent conjugate (PCV13) vaccine. One dose is recommended  after age 8. Pneumococcal polysaccharide (PPSV23) vaccine. One dose is recommended after age 37. Talk to your health care provider about which screenings and vaccines you need and how often you need them. This information is not intended to replace advice given to you by your health care provider. Make sure you discuss any questions you have with your health care provider. Document Released: 01/20/2015 Document Revised: 09/13/2015 Document Reviewed: 10/25/2014 Elsevier Interactive Patient Education  2017 [employer].  Fall Prevention in the Home Falls can cause injuries. They can happen to people of all ages. There are many things you can do to make your home safe and to help prevent falls. What can I do on the outside of my home? Regularly fix the edges of walkways and driveways and fix any cracks. Remove anything that might make you trip as you walk through a door, such as a raised step or threshold. Trim any bushes or trees on the path to your home. Use bright outdoor lighting. Clear any walking paths of anything that might make someone trip, such as rocks or tools. Regularly check to see if handrails are loose or broken. Make sure that both sides of any steps have handrails. Any raised decks and porches should have guardrails on the edges. Have any leaves, snow, or ice cleared regularly. Use sand or salt on walking paths during winter. Clean up any spills in your garage right away. This includes oil or grease spills. What can I do in the bathroom? Use night lights. Install grab bars by the toilet and in the tub and shower. Do not use towel bars as grab bars. Use non-skid mats or decals in the tub or shower. If you need to sit down in the shower, use a plastic, non-slip stool. Keep the floor dry. Clean up any water that spills on the floor as soon as it happens. Remove soap buildup in the tub or shower regularly. Attach bath mats securely with double-sided non-slip rug tape. Do not  have throw rugs and other things on the floor that can make you trip. What can I do in the bedroom? Use night lights. Make sure that you have a light by your bed that is easy to reach. Do not use any sheets or blankets that are too big for your bed. They should not hang down onto the floor. Have a firm chair that has side arms. You can use this for support while you get dressed. Do not have throw rugs and other things on the floor that can make you trip. What can I do in the kitchen? Clean up any spills right away. Avoid walking on wet floors. Keep items that you use a lot in easy-to-reach places. If you need to reach something above you, use a strong step stool that has a grab bar. Keep electrical cords out of the way. Do not use floor polish or wax that makes floors slippery. If you must use wax, use non-skid floor wax. Do not have throw rugs and other things on the floor that can make you trip. What can I do with my stairs? Do not leave any items on the stairs. Make sure that there are handrails on  both sides of the stairs and use them. Fix handrails that are broken or loose. Make sure that handrails are as long as the stairways. Check any carpeting to make sure that it is firmly attached to the stairs. Fix any carpet that is loose or worn. Avoid having throw rugs at the top or bottom of the stairs. If you do have throw rugs, attach them to the floor with carpet tape. Make sure that you have a light switch at the top of the stairs and the bottom of the stairs. If you do not have them, ask someone to add them for you. What else can I do to help prevent falls? Wear shoes that: Do not have high heels. Have rubber bottoms. Are comfortable and fit you well. Are closed at the toe. Do not wear sandals. If you use a stepladder: Make sure that it is fully opened. Do not climb a closed stepladder. Make sure that both sides of the stepladder are locked into place. Ask someone to hold it for  you, if possible. Clearly mark and make sure that you can see: Any grab bars or handrails. First and last steps. Where the edge of each step is. Use tools that help you move around (mobility aids) if they are needed. These include: Canes. Walkers. Scooters. Crutches. Turn on the lights when you go into a dark area. Replace any light bulbs as soon as they burn out. Set up your furniture so you have a clear path. Avoid moving your furniture around. If any of your floors are uneven, fix them. If there are any pets around you, be aware of where they are. Review your medicines with your doctor. Some medicines can make you feel dizzy. This can increase your chance of falling. Ask your doctor what other things that you can do to help prevent falls. This information is not intended to replace advice given to you by your health care provider. Make sure you discuss any questions you have with your health care provider. Document Released: 10/20/2008 Document Revised: 06/01/2015 Document Reviewed: 01/28/2014 Elsevier Interactive Patient Education  2017 [employer].

## 2022-12-02 DIAGNOSIS — G4733 Obstructive sleep apnea (adult) (pediatric): Secondary | ICD-10-CM | POA: Diagnosis not present

## 2022-12-20 DIAGNOSIS — G4733 Obstructive sleep apnea (adult) (pediatric): Secondary | ICD-10-CM | POA: Diagnosis not present

## 2022-12-20 DIAGNOSIS — I10 Essential (primary) hypertension: Secondary | ICD-10-CM | POA: Diagnosis not present

## 2023-01-13 ENCOUNTER — Inpatient Hospital Stay
Admission: RE | Admit: 2023-01-13 | Discharge: 2023-01-13 | Disposition: A | Discharge status: Home / Self Care | Payer: Medicare HMO | Source: Ambulatory Visit | Attending: Family Medicine | Admitting: Family Medicine

## 2023-01-14 DIAGNOSIS — R918 Other nonspecific abnormal finding of lung field: Secondary | ICD-10-CM | POA: Diagnosis not present

## 2023-01-20 DIAGNOSIS — G4733 Obstructive sleep apnea (adult) (pediatric): Secondary | ICD-10-CM | POA: Diagnosis not present

## 2023-01-20 DIAGNOSIS — I10 Essential (primary) hypertension: Secondary | ICD-10-CM | POA: Diagnosis not present

## 2023-01-22 ENCOUNTER — Encounter: Payer: Self-pay | Admitting: Acute Care

## 2023-01-23 ENCOUNTER — Other Ambulatory Visit: Payer: Self-pay | Admitting: Family Medicine

## 2023-01-23 DIAGNOSIS — J069 Acute upper respiratory infection, unspecified: Secondary | ICD-10-CM

## 2023-01-23 DIAGNOSIS — J0140 Acute pansinusitis, unspecified: Secondary | ICD-10-CM

## 2023-01-24 DIAGNOSIS — R918 Other nonspecific abnormal finding of lung field: Secondary | ICD-10-CM | POA: Diagnosis not present

## 2023-01-24 DIAGNOSIS — F17200 Nicotine dependence, unspecified, uncomplicated: Secondary | ICD-10-CM | POA: Diagnosis not present

## 2023-01-24 DIAGNOSIS — J4550 Severe persistent asthma, uncomplicated: Secondary | ICD-10-CM | POA: Diagnosis not present

## 2023-01-24 DIAGNOSIS — R1319 Other dysphagia: Secondary | ICD-10-CM | POA: Diagnosis not present

## 2023-01-26 ENCOUNTER — Other Ambulatory Visit: Payer: Self-pay | Admitting: Neurology

## 2023-02-11 NOTE — Progress Notes (Signed)
 NEUROLOGY FOLLOW UP OFFICE NOTE  CORDELLA NYQUIST [id number]  Assessment/Plan:   1.  Unsteady gait with frequent falls secondary to residual lumbar radiculopathy/chronic pain 2.  Lumbar degenerative disc disease with chronic low back pain with bilateral radiculopathy - primarily involving right L5-S1. 3.  Benign paroxysmal positional vertigo - chronic issue off and on   1.  Gabapentin  to 200mg  in morning; continue 300mg  at bedtime 2. If vertigo does not resolve, home Epley maneuver 3.  Follow up 1 year   Subjective:  Rock CHRISTELLA. Bow is a 73 year old left-handed female with asthma, HTN, arthritis, osteoporosis, OSA, anxiety who follows up for degenerative lumbar spine disease  UPDATE: Current medications:  gabapentin  200mg  in AM and 300mg  QHS   Increased daytime dose of gabapentin .  Overall pain controlled.  Aggravated by cold weather.  Occasionally may interfere with sleep.  Manageable.  Referred to PT for vestibular rehab in August.  Evaluation revealed positive Dix-Hallpike on right.  It helped.  She now has directions to perform the Epley maneuver at home.    Labs from 11/15/2022 reveled B12 993 and TSH 0.70.  She has since discontinued the B12 supplement.      HISTORY: Patient underwent lumbar fusion several years ago.  She had some tingling in the feet afterwards that resolved but then returned a couple of years later.  For years her toes tingled.  Over the past couple of years, she reports shooting pain in her toes of both feet.  When she is sitting, she finds herself constantly wiggling her toes.  In bed, she feels aching in her legs below the knees.  She tries moving her legs in bed which is ineffective and she has to get up and walk around at night.  She sometimes has shooting pain down the side of both legs.  She continues to have chronic back pain.  She returned to her orthopedist who performed a X-ray and was told she had degenerative disc disease of the lumbar spine.  She also has  osteoarthritis and hip pain.  She has history of right knee surgery.  She reports decreased feeling in her feet and doesn't feel stable when standing.  She has had multiple falls over the past year.  Sometimes she feels slight vertigo for a few seconds when she first gets up but dizziness is not the cause of her falls.  She just may fall over, especially if she has to bend over to pick something off of the floor.  She did go to physical therapy which didn't provide any lasting improvement in pain or balance.  She does not have diabetes.    Patient underwent workup for gait instability/frequent falls: 04/18/2020 NCV-EMG:  1. Chronic L5 radiculopathy affecting the right lower extremity, mild.  2.  There is no evidence of a sensorimotor polyneuropathy affecting the lower extremities.  04/05/2020 MRI L-SPINE WO:  Sequela of L2-L5 posterior spinal fusion. Associated susceptibility artifact limits evaluation.  Mild L1-2 spinal canal narrowing.  Mild left L3-4, L4-5, L5-S1 neural foraminal narrowing (spinal canal and right neural foramen at L5-S1 not well visualized).  No discrete fracture. Mild L1 bone marrow edema, likely degenerative related versus contusion. 05/04/2020 MRI BRAIN W WO:  No acute abnormality.  Chronic microvascular ischemic change in the white matter, basal ganglia, and pons of a moderate to advanced degree. 07/19/2020 MRI C-SPINE WO:  Multilevel degenerative changes cervical spine with spinal and foraminal stenosis at multiple levels. No cord compression or fracture.  Mild spinal stenosis with mild cord flattening seen at C4-5 level but nothing significant.  She had endorsed memory concerns.  She underwent neuropsychological evaluation on 05/08/2021 which demonstrated an isolated weakness across phonemic fluency and very mild performance variability across encoding and retrieval aspects of memory likely related to secondary factors such as anxiety, depression, sleep dysfunction and  cerebrovascular disease but not suggestive of an underlying neurodegenerative disease such as Alzheimer's.    PAST MEDICAL HISTORY: Past Medical History:  Diagnosis Date   Allergic rhinitis 01/24/2015   Allergy    Asthma    Atypical chest pain 08/25/2019   Chest tightness 10/27/2013   COPD (chronic obstructive pulmonary disease)    Degenerative disc disease, lumbar 10/14/2012   Generalized anxiety disorder 11/13/2012   Genital warts    Hearing loss    History of COVID-19 08/2020   Hyperlipidemia 08/08/2020   Major depressive disorder    Neuromuscular disorder (HCC)    Obesity (BMI 30-39.9) 10/14/2012   Osteoarthritis 09/28/2010   Osteoporosis    Palpitations 11/28/2020   Pneumonia    Primary hypertension 12/23/2019   Shortness of breath    Sinusitis, acute 10/27/2013   Sleep apnea    Thyroid  disease    Tobacco abuse 09/28/2010   Tubular adenoma of colon 11/18/2017   Vitamin D  deficiency 12/23/2019   Wheezing 10/27/2013    MEDICATIONS: Current Outpatient Medications on File Prior to Visit  Medication Sig Dispense Refill   acetaminophen  (TYLENOL ) 650 MG CR tablet Take 650 mg by mouth 2 times daily at 12 noon and 4 pm. Patient takes 2 tablets twice a day     albuterol  (PROVENTIL ) (2.5 MG/3ML) 0.083% nebulizer solution Take 3 mLs (2.5 mg total) by nebulization every 4 (four) hours as needed for wheezing or shortness of breath. 150 mL 1   albuterol  (VENTOLIN  HFA) 108 (90 Base) MCG/ACT inhaler Inhale 2 puffs into the lungs every 6 (six) hours as needed for wheezing or shortness of breath. 1 each 5   alendronate  (FOSAMAX ) 70 MG tablet TAKE 1 TABLET (70 MG TOTAL) BY MOUTH EVERY 7 DAYS WITH FULL GLASS WATER ON EMPTY STOMACH 12 tablet 3   aspirin EC 81 MG tablet Take 81 mg by mouth daily. Swallow whole.     Azelastine  HCl 137 MCG/SPRAY SOLN PLACE 2 SPRAYS INTO BOTH NOSTRILS 2 (TWO) TIMES DAILY. USE IN EACH NOSTRIL AS DIRECTED 30 mL 6   BREO ELLIPTA 200-25 MCG/ACT AEPB Inhale 1 puff  into the lungs daily.     calcium -vitamin D  (OSCAL WITH D) 500-5 MG-MCG tablet Take 1 tablet by mouth.     chlorpheniramine (CHLOR-TRIMETON) 4 MG tablet Take 4 mg by mouth daily as needed for allergies.     cholecalciferol 25 MCG (1000 UT) tablet Take 1,000 Units by mouth daily.     citalopram  (CELEXA ) 40 MG tablet Take 1 tablet (40 mg total) by mouth daily. 90 tablet 1   co-enzyme Q-10 30 MG capsule Take 30 mg by mouth 3 (three) times daily.     dilTIAZem  HCl ER 300 MG TB24 Take 300 mg by mouth daily.     diphenhydrAMINE  (SOMINEX) 25 MG tablet Take 25 mg by mouth at bedtime.     fluticasone  (FLONASE ) 50 MCG/ACT nasal spray Place 2 sprays into both nostrils daily. 48 mL 2   gabapentin  (NEURONTIN ) 100 MG capsule TAKE 2 CAPSULES (200 MG TOTAL) BY MOUTH IN THE MORNING. 180 capsule 0   gabapentin  (NEURONTIN ) 300 MG capsule Take 1  capsule (300 mg total) by mouth at bedtime. 90 capsule 1   hydrochlorothiazide  (MICROZIDE ) 12.5 MG capsule TAKE 1 CAPSULE BY MOUTH EVERY DAY 60 capsule 0   ipratropium (ATROVENT ) 0.02 % nebulizer solution Take 2.5 mLs (0.5 mg total) by nebulization 4 (four) times daily. 75 mL 12   isosorbide  mononitrate (IMDUR ) 30 MG 24 hr tablet Take 0.5 tablets (15 mg total) by mouth daily. 45 tablet 1   montelukast  (SINGULAIR ) 10 MG tablet Take 1 tablet (10 mg total) by mouth at bedtime. 30 tablet 3   nitroGLYCERIN  (NITROSTAT ) 0.4 MG SL tablet Place 1 tablet (0.4 mg total) under the tongue every 5 (five) minutes as needed for chest pain. (Patient not taking: Reported on 08/27/2022) 90 tablet 3   pravastatin  (PRAVACHOL ) 40 MG tablet Take 1 tablet (40 mg total) by mouth daily. 90 tablet 3   tiZANidine  (ZANAFLEX ) 4 MG capsule Take 1 capsule (4 mg total) by mouth 3 (three) times daily as needed for muscle spasms. 30 capsule 2   traMADol (ULTRAM) 50 MG tablet Take 50 mg by mouth 2 (two) times daily as needed. (Patient not taking: Reported on 08/27/2022)     vitamin B-12 (CYANOCOBALAMIN ) 1000 MCG  tablet Take 1,000 mcg by mouth daily.     No current facility-administered medications on file prior to visit.    ALLERGIES: No Known Allergies  FAMILY HISTORY: Family History  Problem Relation Age of Onset   Arthritis Mother    Atrial fibrillation Mother    Hypertension Mother    Arthritis Father    Stroke Father    Colon cancer Father 11   Heart disease Father 73       chf   Breast cancer Maternal Grandmother    Asthma Maternal Grandmother    Asthma Daughter    Esophageal cancer Neg Hx    Ulcerative colitis Neg Hx    Stomach cancer Neg Hx    Colon polyps Neg Hx    Rectal cancer Neg Hx       Objective:  Blood pressure 120/63, pulse 67, height 5' 2 (1.575 m), weight 224 lb (101.6 kg), SpO2 94%. General: No acute distress.  Patient appears well-groomed.   Head:  Normocephalic/atraumatic Eyes:  Fundi examined but not visualized Neck: supple, no paraspinal tenderness, full range of motion Heart:  Regular rate and rhythm Back: no paraspinal tenderness Neurological Exam: alert and oriented.  Speech fluent and not dysarthric, language intact.  CN II-XII intact. Bulk and tone normal, muscle strength 5/5 throughout.  Sensation to pinprick and vibration in right foot.  Deep tendon reflexes absent in right patellar, otherwise, 2+ throughout.  Finger to nose testing intact.  Gait cautious. Romberg negative.    Juliene Dunnings, DO  CC: Jamee Antonio Meth, DO

## 2023-02-12 ENCOUNTER — Ambulatory Visit (INDEPENDENT_AMBULATORY_CARE_PROVIDER_SITE_OTHER): Payer: Medicare HMO | Admitting: Neurology

## 2023-02-12 ENCOUNTER — Encounter: Payer: Self-pay | Admitting: Neurology

## 2023-02-12 VITALS — BP 120/63 | HR 67 | Ht 62.0 in | Wt 224.0 lb

## 2023-02-12 DIAGNOSIS — G4710 Hypersomnia, unspecified: Secondary | ICD-10-CM | POA: Diagnosis not present

## 2023-02-12 DIAGNOSIS — I10 Essential (primary) hypertension: Secondary | ICD-10-CM | POA: Diagnosis not present

## 2023-02-12 DIAGNOSIS — G4700 Insomnia, unspecified: Secondary | ICD-10-CM | POA: Diagnosis not present

## 2023-02-12 DIAGNOSIS — M5416 Radiculopathy, lumbar region: Secondary | ICD-10-CM | POA: Diagnosis not present

## 2023-02-12 DIAGNOSIS — G4733 Obstructive sleep apnea (adult) (pediatric): Secondary | ICD-10-CM | POA: Diagnosis not present

## 2023-02-12 NOTE — Patient Instructions (Signed)
 Continue gabapentin  200mg  in morning and 300mg  at bedtime

## 2023-02-14 DIAGNOSIS — K224 Dyskinesia of esophagus: Secondary | ICD-10-CM | POA: Diagnosis not present

## 2023-02-14 DIAGNOSIS — K449 Diaphragmatic hernia without obstruction or gangrene: Secondary | ICD-10-CM | POA: Diagnosis not present

## 2023-02-14 DIAGNOSIS — K219 Gastro-esophageal reflux disease without esophagitis: Secondary | ICD-10-CM | POA: Diagnosis not present

## 2023-02-14 DIAGNOSIS — R1319 Other dysphagia: Secondary | ICD-10-CM | POA: Diagnosis not present

## 2023-02-19 ENCOUNTER — Other Ambulatory Visit: Payer: Medicare HMO

## 2023-02-20 DIAGNOSIS — I10 Essential (primary) hypertension: Secondary | ICD-10-CM | POA: Diagnosis not present

## 2023-02-20 DIAGNOSIS — G4733 Obstructive sleep apnea (adult) (pediatric): Secondary | ICD-10-CM | POA: Diagnosis not present

## 2023-03-05 DIAGNOSIS — G4733 Obstructive sleep apnea (adult) (pediatric): Secondary | ICD-10-CM | POA: Diagnosis not present

## 2023-03-07 ENCOUNTER — Other Ambulatory Visit (HOSPITAL_COMMUNITY)
Admission: RE | Admit: 2023-03-07 | Discharge: 2023-03-07 | Disposition: A | Discharge status: Home / Self Care | Source: Ambulatory Visit | Attending: Interventional Radiology | Admitting: Interventional Radiology

## 2023-03-07 ENCOUNTER — Ambulatory Visit
Admission: RE | Admit: 2023-03-07 | Discharge: 2023-03-07 | Disposition: A | Discharge status: Home / Self Care | Payer: Medicare HMO | Source: Ambulatory Visit | Attending: Family Medicine | Admitting: Family Medicine

## 2023-03-07 DIAGNOSIS — E041 Nontoxic single thyroid nodule: Secondary | ICD-10-CM | POA: Insufficient documentation

## 2023-03-11 LAB — CYTOLOGY - NON PAP

## 2023-03-15 ENCOUNTER — Other Ambulatory Visit: Payer: Self-pay | Admitting: Family Medicine

## 2023-03-15 DIAGNOSIS — M62838 Other muscle spasm: Secondary | ICD-10-CM

## 2023-03-18 ENCOUNTER — Encounter: Payer: Self-pay | Admitting: Physician Assistant

## 2023-03-18 ENCOUNTER — Encounter: Payer: Self-pay | Admitting: Family Medicine

## 2023-03-18 ENCOUNTER — Telehealth (INDEPENDENT_AMBULATORY_CARE_PROVIDER_SITE_OTHER): Admitting: Physician Assistant

## 2023-03-18 DIAGNOSIS — H9202 Otalgia, left ear: Secondary | ICD-10-CM | POA: Diagnosis not present

## 2023-03-18 NOTE — Progress Notes (Signed)
 MyChart Video Visit    Virtual Visit via Video Note   This format is felt to be most appropriate for this patient at this time. Physical exam was limited by quality of the video and audio technology used for the visit.   Patient location: home Provider location: [email or web address]  I discussed the limitations of evaluation and management by telemedicine and the availability of in person appointments. The patient expressed understanding and agreed to proceed.  Patient: Jasmine Mclaughlin   DOB: 10/27/1950   73 y.o. Female  MRN: [id number] Visit Date: 03/18/2023  Today's healthcare provider: Alfredia Ferguson, PA-C   Chief Complaint  Patient presents with   Facial Pain    Patient complains of left sided sinus pain, ear and neck pain   Subjective    HPI Pt reports blowing her nose yesterday, and then experiencing some left sided ear pain that radiates to her throat/neck. Pain has been present since. She has taken some ibuprofen over the counter. Noticed a small amount of nose bleeding while blowing her nose.   Denies any specific sick contacts    Medications: Outpatient Medications Prior to Visit  Medication Sig   acetaminophen (TYLENOL) 650 MG CR tablet Take 650 mg by mouth 2 times daily at 12 noon and 4 pm. Patient takes 2 tablets twice a day   albuterol (PROVENTIL) (2.5 MG/3ML) 0.083% nebulizer solution Take 3 mLs (2.5 mg total) by nebulization every 4 (four) hours as needed for wheezing or shortness of breath.   albuterol (VENTOLIN HFA) 108 (90 Base) MCG/ACT inhaler Inhale 2 puffs into the lungs every 6 (six) hours as needed for wheezing or shortness of breath.   alendronate (FOSAMAX) 70 MG tablet TAKE 1 TABLET (70 MG TOTAL) BY MOUTH EVERY 7 DAYS WITH FULL GLASS WATER ON EMPTY STOMACH   aspirin EC 81 MG tablet Take 81 mg by mouth daily. Swallow whole.   Azelastine HCl 137 MCG/SPRAY SOLN PLACE 2 SPRAYS INTO BOTH NOSTRILS 2 (TWO) TIMES DAILY. USE IN EACH NOSTRIL AS DIRECTED   BREO ELLIPTA  200-25 MCG/ACT AEPB Inhale 1 puff into the lungs daily.   calcium-vitamin D (OSCAL WITH D) 500-5 MG-MCG tablet Take 1 tablet by mouth.   chlorpheniramine (CHLOR-TRIMETON) 4 MG tablet Take 4 mg by mouth daily as needed for allergies.   citalopram (CELEXA) 40 MG tablet Take 1 tablet (40 mg total) by mouth daily.   dilTIAZem HCl ER 300 MG TB24 Take 300 mg by mouth daily.   diphenhydrAMINE (SOMINEX) 25 MG tablet Take 25 mg by mouth at bedtime.   fluticasone (FLONASE) 50 MCG/ACT nasal spray Place 2 sprays into both nostrils daily.   gabapentin (NEURONTIN) 100 MG capsule TAKE 2 CAPSULES (200 MG TOTAL) BY MOUTH IN THE MORNING.   gabapentin (NEURONTIN) 300 MG capsule Take 1 capsule (300 mg total) by mouth at bedtime.   hydrochlorothiazide (MICROZIDE) 12.5 MG capsule TAKE 1 CAPSULE BY MOUTH EVERY DAY   ipratropium (ATROVENT) 0.02 % nebulizer solution Take 2.5 mLs (0.5 mg total) by nebulization 4 (four) times daily.   montelukast (SINGULAIR) 10 MG tablet Take 1 tablet (10 mg total) by mouth at bedtime.   pravastatin (PRAVACHOL) 40 MG tablet Take 1 tablet (40 mg total) by mouth daily.   tiZANidine (ZANAFLEX) 4 MG capsule TAKE 1 CAPSULE BY MOUTH 3 TIMES DAILY AS NEEDED FOR MUSCLE SPASMS.   isosorbide mononitrate (IMDUR) 30 MG 24 hr tablet Take 0.5 tablets (15 mg total) by mouth daily.   nitroGLYCERIN (  NITROSTAT) 0.4 MG SL tablet Place 1 tablet (0.4 mg total) under the tongue every 5 (five) minutes as needed for chest pain. (Patient not taking: Reported on 02/12/2023)   No facility-administered medications prior to visit.    Review of Systems  Constitutional:  Negative for fatigue and fever.  HENT:  Positive for ear pain, sinus pain and sore throat.   Respiratory:  Negative for cough and shortness of breath.   Cardiovascular:  Negative for chest pain and leg swelling.  Gastrointestinal:  Negative for abdominal pain.  Neurological:  Negative for dizziness and headaches.        Objective    There  were no vitals taken for this visit.      Physical Exam Constitutional:      Appearance: Normal appearance. She is not ill-appearing.  Neurological:     Mental Status: She is oriented to person, place, and time.  Psychiatric:        Mood and Affect: Mood normal.        Behavior: Behavior normal.        Assessment & Plan    1. Left ear pain (Primary) Advised virtually I cannot eval for an ear infection. Given 1-2 days of symptoms -- recommending otc ibuprofen/tylenol, azelastine and flonase nasal sprays, and zyrtec/claritin on top of her usual singulair.  She can also rinse w/ nasal saline spray.  If no improvement, or worsening symptoms ,recommend a visit in office.  Return if symptoms worsen or fail to improve.     I discussed the assessment and treatment plan with the patient. The patient was provided an opportunity to ask questions and all were answered. The patient agreed with the plan and demonstrated an understanding of the instructions.   The patient was advised to call back or seek an in-person evaluation if the symptoms worsen or if the condition fails to improve as anticipated.  Alfredia Ferguson, PA-C [facility] [phone] (phone) [phone] (fax)  [facility]

## 2023-03-20 DIAGNOSIS — I10 Essential (primary) hypertension: Secondary | ICD-10-CM | POA: Diagnosis not present

## 2023-03-20 DIAGNOSIS — G4733 Obstructive sleep apnea (adult) (pediatric): Secondary | ICD-10-CM | POA: Diagnosis not present

## 2023-04-20 DIAGNOSIS — I10 Essential (primary) hypertension: Secondary | ICD-10-CM | POA: Diagnosis not present

## 2023-04-20 DIAGNOSIS — G4733 Obstructive sleep apnea (adult) (pediatric): Secondary | ICD-10-CM | POA: Diagnosis not present

## 2023-04-27 ENCOUNTER — Other Ambulatory Visit: Payer: Self-pay | Admitting: Neurology

## 2023-04-28 ENCOUNTER — Other Ambulatory Visit: Payer: Self-pay

## 2023-04-28 ENCOUNTER — Encounter: Payer: Self-pay | Admitting: Neurology

## 2023-04-28 MED ORDER — GABAPENTIN 100 MG PO CAPS: 200.0000 mg | ORAL_CAPSULE | Freq: Every morning | ORAL | 0 refills | Status: DC

## 2023-04-28 MED ORDER — GABAPENTIN 300 MG PO CAPS: 300.0000 mg | ORAL_CAPSULE | Freq: Every day | ORAL | 0 refills | Status: DC

## 2023-04-29 ENCOUNTER — Encounter: Payer: Self-pay | Admitting: Family Medicine

## 2023-04-29 ENCOUNTER — Ambulatory Visit (INDEPENDENT_AMBULATORY_CARE_PROVIDER_SITE_OTHER): Admitting: Family Medicine

## 2023-04-29 VITALS — BP 130/62 | HR 68 | Temp 98.7°F | Resp 20 | Ht 62.0 in | Wt 224.4 lb

## 2023-04-29 DIAGNOSIS — M62838 Other muscle spasm: Secondary | ICD-10-CM | POA: Diagnosis not present

## 2023-04-29 DIAGNOSIS — J209 Acute bronchitis, unspecified: Secondary | ICD-10-CM

## 2023-04-29 DIAGNOSIS — J40 Bronchitis, not specified as acute or chronic: Secondary | ICD-10-CM | POA: Diagnosis not present

## 2023-04-29 DIAGNOSIS — J440 Chronic obstructive pulmonary disease with acute lower respiratory infection: Secondary | ICD-10-CM | POA: Diagnosis not present

## 2023-04-29 MED ORDER — CYCLOBENZAPRINE HCL 10 MG PO TABS: 10.0000 mg | ORAL_TABLET | Freq: Three times a day (TID) | ORAL | 0 refills | Status: DC | PRN

## 2023-04-29 MED ORDER — METHYLPREDNISOLONE ACETATE 80 MG/ML IJ SUSP
80.0000 mg | Freq: Once | INTRAMUSCULAR | Status: AC
  Administered 2023-04-29: 80 mg via INTRAMUSCULAR

## 2023-04-29 MED ORDER — PROMETHAZINE-DM 6.25-15 MG/5ML PO SYRP: 5.0000 mL | ORAL_SOLUTION | Freq: Four times a day (QID) | ORAL | 0 refills | Status: AC | PRN

## 2023-04-29 MED ORDER — AMOXICILLIN-POT CLAVULANATE 875-125 MG PO TABS: 1.0000 | ORAL_TABLET | Freq: Two times a day (BID) | ORAL | 0 refills | Status: AC

## 2023-04-29 NOTE — Progress Notes (Signed)
 Established Patient Office Visit  Subjective   Patient ID: Jasmine Mclaughlin, female    DOB: 03/29/1950  Age: 73 y.o. MRN: [id number]  Chief Complaint  Patient presents with   Nasal Congestion    Sxs started Sunday, pt states congestion, non productive cough, no otc meds, no covid or flu test at home     HPI Discussed the use of AI scribe software for clinical note transcription with the patient, who gave verbal consent to proceed.  History of Present Illness Jasmine Mclaughlin is a 73 year old female who presents with congestion and difficulty breathing.  She has been experiencing head and chest congestion since Sunday, without fever. There is no productive cough, but she describes expelling 'gummy stuff' with a brown shell when blowing her nose. She experiences sinus pressure and chest tightness, with reduced air movement.  She is using a nasal spray, specifically Vaseline, which seems to be helping. She frequently uses an inhaler and has a nebulizer at home with Atrovent  and albuterol  available. She has not used the nebulizer today but plans to do so later. She takes generic Zyrtec for allergies and over-the-counter Tylenol , 650 mg in the morning, at night, and sometimes at lunch. She is currently taking Astelin  and Flonase  for nasal symptoms.  She has been taking tizanidine  for back pain, which causes drowsiness but does not relieve the pain. She had a spinal injection a year ago and has been advised against further back surgery, which she does not want to pursue. She has consulted an orthopedic surgeon for her back issues.  No fever. Sinus pressure and chest tightness are present. Frequent use of inhaler, but nebulizer has not been used today.   Patient Active Problem List   Diagnosis Date Noted   Thyroid  nodule 11/15/2022   Morbid obesity (HCC) 11/15/2022   B12 deficiency 11/15/2022   Muscle spasm 11/15/2022   Obstructive sleep apnea syndrome 11/15/2022   Preventative health care  11/13/2021   Acute non-recurrent pansinusitis 08/30/2021   Acute bronchitis with COPD (HCC) 08/30/2021   Depression with anxiety 05/10/2021   Major depressive disorder    Palpitations 11/28/2020   Hyperlipidemia 08/08/2020   History of COVID-19 08/2020   Primary hypertension 12/23/2019   Vitamin D  deficiency 12/23/2019   Atypical chest pain 08/25/2019   Pneumonia    Osteoporosis    Genital warts    Tubular adenoma of colon 11/18/2017   Allergic rhinitis 01/24/2015   Bronchitis 03/07/2014   COPD (chronic obstructive pulmonary disease) 11/22/2013   Generalized anxiety disorder 11/13/2012   Degenerative disc disease, lumbar 10/14/2012   Obesity (BMI 30-39.9) 10/14/2012   Osteoarthritis 09/28/2010   Tobacco abuse 09/28/2010   Past Medical History:  Diagnosis Date   Allergic rhinitis 01/24/2015   Allergy    Asthma    Atypical chest pain 08/25/2019   Chest tightness 10/27/2013   COPD (chronic obstructive pulmonary disease)    Degenerative disc disease, lumbar 10/14/2012   Generalized anxiety disorder 11/13/2012   Genital warts    Hearing loss    History of COVID-19 08/2020   Hyperlipidemia 08/08/2020   Major depressive disorder    Neuromuscular disorder (HCC)    Obesity (BMI 30-39.9) 10/14/2012   Osteoarthritis 09/28/2010   Osteoporosis    Palpitations 11/28/2020   Pneumonia    Primary hypertension 12/23/2019   Shortness of breath    Sinusitis, acute 10/27/2013   Sleep apnea    Thyroid  disease    Tobacco abuse 09/28/2010  Tubular adenoma of colon 11/18/2017   Vitamin D  deficiency 12/23/2019   Wheezing 10/27/2013   Past Surgical History:  Procedure Laterality Date   ANTERIOR FUSION LUMBAR SPINE  05/06/2012   ANTERIOR LAT LUMBAR FUSION N/A 05/06/2012   Procedure: ANTERIOR LATERAL LUMBAR FUSION 3 LEVELS;  Surgeon: Estevan Helper, MD;  Location: [facility] OR;  Service: Orthopedics;  Laterality: N/A;  Lateral interbody fusion, lumbar 2-3,lumbar 3-4, lumbar 4-5 with  instrumentation and allograft.   CATARACT EXTRACTION Left 08/2022   mincey   CATARACT EXTRACTION Right 09/2022   mincey   COLONOSCOPY     COLONOSCOPY WITH PROPOFOL  N/A 12/15/2017   Procedure: COLONOSCOPY WITH PROPOFOL ;  Surgeon: Brice Campi Albino Alu., MD;  Location: [facility] ENDOSCOPY;  Service: Gastroenterology;  Laterality: N/A;   COLONOSCOPY WITH PROPOFOL  N/A 07/08/2018   Procedure: COLONOSCOPY WITH PROPOFOL ;  Surgeon: Mansouraty, Albino Alu., MD;  Location: [facility] ENDOSCOPY;  Service: Gastroenterology;  Laterality: N/A;   COLONOSCOPY WITH PROPOFOL  N/A 09/01/2019   Procedure: COLONOSCOPY WITH PROPOFOL ;  Surgeon: Mansouraty, Albino Alu., MD;  Location: [facility] ENDOSCOPY;  Service: Gastroenterology;  Laterality: N/A;   EYE SURGERY     KNEE ARTHROSCOPY Right     R knee 01/2012, for ligament tears   POLYPECTOMY  07/08/2018   Procedure: POLYPECTOMY;  Surgeon: Mansouraty, Albino Alu., MD;  Location: [facility] ENDOSCOPY;  Service: Gastroenterology;;   POLYPECTOMY  09/01/2019   Procedure: POLYPECTOMY;  Surgeon: Normie Becton., MD;  Location: [facility] ENDOSCOPY;  Service: Gastroenterology;;   SPINE SURGERY     TONSILLECTOMY     TUBAL LIGATION     VAGINAL DELIVERY     x1   Social History   Tobacco Use   Smoking status: Former    Current packs/day: 0.00    Average packs/day: 0.5 packs/day for 55.0 years (27.5 ttl pk-yrs)    Types: Cigarettes    Start date: 09/20/1966    Quit date: 09/19/2021    Years since quitting: 1.6   Smokeless tobacco: Never  Vaping Use   Vaping status: Never Used  Substance Use Topics   Alcohol use: Yes    Alcohol/week: 1.0 standard drink of alcohol    Types: 1 Standard drinks or equivalent per week   Drug use: No   Social History   Socioeconomic History   Marital status: Single    Spouse name: Not on file   Number of children: 1   Years of education: 12   Highest education level: Some college, no degree  Occupational History   Occupation: Retired    Comment:  Audiological scientist  Tobacco Use   Smoking status: Former    Current packs/day: 0.00    Average packs/day: 0.5 packs/day for 55.0 years (27.5 ttl pk-yrs)    Types: Cigarettes    Start date: 09/20/1966    Quit date: 09/19/2021    Years since quitting: 1.6   Smokeless tobacco: Never  Vaping Use   Vaping status: Never Used  Substance and Sexual Activity   Alcohol use: Yes    Alcohol/week: 1.0 standard drink of alcohol    Types: 1 Standard drinks or equivalent per week   Drug use: No   Sexual activity: Not Currently    Partners: Male  Other Topics Concern   Not on file  Social History Narrative   1 caffeine drinks daily   No exercise   Left handed   One story home   Social Drivers of Health   Financial Resource Strain: Low Risk  (03/17/2023)   Overall  Financial Resource Strain (CARDIA)    Difficulty of Paying Living Expenses: Not very hard  Food Insecurity: Unknown (03/17/2023)   Hunger Vital Sign    Worried About Running Out of Food in the Last Year: Patient declined    Ran Out of Food in the Last Year: Never true  Transportation Needs: No Transportation Needs (03/17/2023)   PRAPARE - Administrator, Civil Service (Medical): No    Lack of Transportation (Non-Medical): No  Physical Activity: Insufficiently Active (03/17/2023)   Exercise Vital Sign    Days of Exercise per Week: 5 days    Minutes of Exercise per Session: 20 min  Stress: Stress Concern Present (03/17/2023)   [employer] of Occupational Health - Occupational Stress Questionnaire    Feeling of Stress : To some extent  Social Connections: Unknown (03/17/2023)   Social Connection and Isolation Panel [NHANES]    Frequency of Communication with Friends and Family: Once a week    Frequency of Social Gatherings with Friends and Family: More than three times a week    Attends Religious Services: Patient declined    Database administrator or Organizations: No    Attends Banker Meetings: Never     Marital Status: Patient declined  Catering manager Violence: Not At Risk (11/20/2022)   Humiliation, Afraid, Rape, and Kick questionnaire    Fear of Current or Ex-Partner: No    Emotionally Abused: No    Physically Abused: No    Sexually Abused: No   Family Status  Relation Name Status   Mother Merlinda Starling Deceased at age 71   Father Estrellita Henderson Deceased   MGM Marybeth Smock (Not Specified)   Daughter Melony Squibb (Not Specified)   Neg Hx  (Not Specified)  No partnership data on file   Family History  Problem Relation Age of Onset   Arthritis Mother    Atrial fibrillation Mother    Hypertension Mother    Arthritis Father    Stroke Father    Colon cancer Father 50   Heart disease Father 29       chf   Breast cancer Maternal Grandmother    Asthma Maternal Grandmother    Asthma Daughter    Esophageal cancer Neg Hx    Ulcerative colitis Neg Hx    Stomach cancer Neg Hx    Colon polyps Neg Hx    Rectal cancer Neg Hx    No Known Allergies    ROS    Objective:     BP 130/62 (BP Location: Left Arm, Patient Position: Sitting, Cuff Size: Large)   Pulse 68   Temp 98.7 F (37.1 C) (Oral)   Resp 20   Ht 5\' 2"  (1.575 m)   Wt 224 lb 6.4 oz (101.8 kg)   SpO2 96%   BMI 41.04 kg/m  BP Readings from Last 3 Encounters:  04/29/23 130/62  02/12/23 120/63  11/15/22 110/60   Wt Readings from Last 3 Encounters:  04/29/23 224 lb 6.4 oz (101.8 kg)  02/12/23 224 lb (101.6 kg)  11/15/22 222 lb 6.4 oz (100.9 kg)   SpO2 Readings from Last 3 Encounters:  04/29/23 96%  02/12/23 94%  11/15/22 95%      Physical Exam Pulmonary:     Effort: No respiratory distress.     Breath sounds: Wheezing present.      No results found for any visits on 04/29/23.  Last CBC Lab Results  Component Value Date   WBC  9.2 11/15/2022   HGB 12.3 11/15/2022   HCT 37.1 11/15/2022   MCV 97.2 11/15/2022   MCH 34.0 (H) 09/23/2019   RDW 13.8 11/15/2022   PLT 380.0 11/15/2022   Last  metabolic panel Lab Results  Component Value Date   GLUCOSE 90 11/15/2022   NA 140 11/15/2022   K 4.9 11/15/2022   CL 101 11/15/2022   CO2 29 11/15/2022   BUN 22 11/15/2022   CREATININE 0.93 11/15/2022   GFR 61.86 11/15/2022   CALCIUM  10.0 11/15/2022   PROT 6.9 11/15/2022   ALBUMIN  4.4 11/15/2022   BILITOT 0.5 11/15/2022   ALKPHOS 71 11/15/2022   AST 11 11/15/2022   ALT 9 11/15/2022   Last lipids Lab Results  Component Value Date   CHOL 131 11/15/2022   HDL 39.20 11/15/2022   LDLCALC 67 11/15/2022   LDLDIRECT 135.0 12/23/2019   TRIG 122.0 11/15/2022   CHOLHDL 3 11/15/2022   Last hemoglobin A1c No results found for: "HGBA1C" Last thyroid  functions Lab Results  Component Value Date   TSH 0.70 11/15/2022   Last vitamin D  Lab Results  Component Value Date   VD25OH 42.53 11/15/2022   Last vitamin B12 and Folate Lab Results  Component Value Date   VITAMINB12 993 (H) 11/15/2022      The 10-year ASCVD risk score (Arnett DK, et al., 2019) is: 13.6%    Assessment & Plan:   Problem List Items Addressed This Visit       Unprioritized   Bronchitis - Primary   Relevant Medications   amoxicillin -clavulanate (AUGMENTIN ) 875-125 MG tablet   promethazine -dextromethorphan (PROMETHAZINE -DM) 6.25-15 MG/5ML syrup   Muscle spasm   Relevant Medications   cyclobenzaprine  (FLEXERIL ) 10 MG tablet   Acute bronchitis with COPD (HCC)   Abx per orders Cough meds  Depo medrol  80 mg IM Return to office as needed        Relevant Medications   promethazine -dextromethorphan (PROMETHAZINE -DM) 6.25-15 MG/5ML syrup  Assessment and Plan Assessment & Plan Asthma   She is experiencing an asthma exacerbation with increased inhaler use and chest tightness. Prefers a steroid injection over oral prednisone  for breathing difficulties. Administer a steroid injection for the exacerbation. Continue using the inhaler and nebulizer as needed.  Congestion and sinus pressure   She reports  congestion and sinus pressure with nasal discharge described as 'gummy' and 'brown shell'. Currently using Astelin , Flonase , and Zyrtec for symptom relief. No fever is present. Prescribe Augmentin  and cough medicine for nighttime use. Continue Astelin  and Flonase  nasal sprays, and Zyrtec for allergy relief.  Back pain   Chronic back pain is not adequately managed with tizanidine , which causes somnolence without pain relief. She has had a previous spinal injection and back surgery but is not interested in further surgery and is considering a second opinion. Prescribe Flexeril  as a new muscle relaxant and consider obtaining a second opinion for back pain management.    No follow-ups on file.    Eli Adami R Lowne Chase, DO

## 2023-04-29 NOTE — Assessment & Plan Note (Signed)
 Abx per orders Cough meds  Depo medrol  80 mg IM Return to office as needed

## 2023-05-20 DIAGNOSIS — I10 Essential (primary) hypertension: Secondary | ICD-10-CM | POA: Diagnosis not present

## 2023-05-20 DIAGNOSIS — G4733 Obstructive sleep apnea (adult) (pediatric): Secondary | ICD-10-CM | POA: Diagnosis not present

## 2023-05-24 DIAGNOSIS — Z87891 Personal history of nicotine dependence: Secondary | ICD-10-CM | POA: Diagnosis not present

## 2023-05-24 DIAGNOSIS — I10 Essential (primary) hypertension: Secondary | ICD-10-CM | POA: Diagnosis not present

## 2023-05-24 DIAGNOSIS — Z7982 Long term (current) use of aspirin: Secondary | ICD-10-CM | POA: Diagnosis not present

## 2023-05-24 DIAGNOSIS — E785 Hyperlipidemia, unspecified: Secondary | ICD-10-CM | POA: Diagnosis not present

## 2023-05-24 DIAGNOSIS — I25119 Atherosclerotic heart disease of native coronary artery with unspecified angina pectoris: Secondary | ICD-10-CM | POA: Diagnosis not present

## 2023-05-24 DIAGNOSIS — J4489 Other specified chronic obstructive pulmonary disease: Secondary | ICD-10-CM | POA: Diagnosis not present

## 2023-05-24 DIAGNOSIS — Z809 Family history of malignant neoplasm, unspecified: Secondary | ICD-10-CM | POA: Diagnosis not present

## 2023-05-24 DIAGNOSIS — M1990 Unspecified osteoarthritis, unspecified site: Secondary | ICD-10-CM | POA: Diagnosis not present

## 2023-05-24 DIAGNOSIS — F325 Major depressive disorder, single episode, in full remission: Secondary | ICD-10-CM | POA: Diagnosis not present

## 2023-05-24 DIAGNOSIS — I700 Atherosclerosis of aorta: Secondary | ICD-10-CM | POA: Diagnosis not present

## 2023-05-24 DIAGNOSIS — J4550 Severe persistent asthma, uncomplicated: Secondary | ICD-10-CM | POA: Diagnosis not present

## 2023-05-26 ENCOUNTER — Other Ambulatory Visit: Payer: Self-pay | Admitting: Family Medicine

## 2023-05-26 ENCOUNTER — Encounter: Payer: Self-pay | Admitting: Family Medicine

## 2023-05-26 DIAGNOSIS — M62838 Other muscle spasm: Secondary | ICD-10-CM

## 2023-05-26 MED ORDER — TIZANIDINE HCL 4 MG PO TABS: 4.0000 mg | ORAL_TABLET | Freq: Four times a day (QID) | ORAL | 0 refills | Status: DC | PRN

## 2023-06-03 DIAGNOSIS — Z1231 Encounter for screening mammogram for malignant neoplasm of breast: Secondary | ICD-10-CM | POA: Diagnosis not present

## 2023-06-03 DIAGNOSIS — G4733 Obstructive sleep apnea (adult) (pediatric): Secondary | ICD-10-CM | POA: Diagnosis not present

## 2023-06-03 LAB — HM MAMMOGRAPHY

## 2023-06-04 ENCOUNTER — Encounter: Payer: Self-pay | Admitting: Family Medicine

## 2023-06-20 DIAGNOSIS — I10 Essential (primary) hypertension: Secondary | ICD-10-CM | POA: Diagnosis not present

## 2023-06-20 DIAGNOSIS — G4733 Obstructive sleep apnea (adult) (pediatric): Secondary | ICD-10-CM | POA: Diagnosis not present

## 2023-06-22 ENCOUNTER — Encounter: Payer: Self-pay | Admitting: Family Medicine

## 2023-06-23 ENCOUNTER — Other Ambulatory Visit: Payer: Self-pay | Admitting: Family Medicine

## 2023-06-23 DIAGNOSIS — E041 Nontoxic single thyroid nodule: Secondary | ICD-10-CM

## 2023-06-23 DIAGNOSIS — E538 Deficiency of other specified B group vitamins: Secondary | ICD-10-CM

## 2023-06-23 DIAGNOSIS — R6889 Other general symptoms and signs: Secondary | ICD-10-CM

## 2023-06-23 DIAGNOSIS — E785 Hyperlipidemia, unspecified: Secondary | ICD-10-CM

## 2023-06-23 DIAGNOSIS — I10 Essential (primary) hypertension: Secondary | ICD-10-CM

## 2023-06-23 DIAGNOSIS — E559 Vitamin D deficiency, unspecified: Secondary | ICD-10-CM

## 2023-06-23 DIAGNOSIS — M62838 Other muscle spasm: Secondary | ICD-10-CM

## 2023-06-23 MED ORDER — TIZANIDINE HCL 4 MG PO TABS: 4.0000 mg | ORAL_TABLET | Freq: Four times a day (QID) | ORAL | 0 refills | Status: AC | PRN

## 2023-06-23 NOTE — Telephone Encounter (Signed)
 Spoke with pt and scheduled her lab visit for tomorrow at 10am.

## 2023-06-24 ENCOUNTER — Encounter: Payer: Self-pay | Admitting: Family Medicine

## 2023-06-24 ENCOUNTER — Other Ambulatory Visit (INDEPENDENT_AMBULATORY_CARE_PROVIDER_SITE_OTHER)

## 2023-06-24 DIAGNOSIS — I10 Essential (primary) hypertension: Secondary | ICD-10-CM | POA: Diagnosis not present

## 2023-06-24 DIAGNOSIS — E538 Deficiency of other specified B group vitamins: Secondary | ICD-10-CM

## 2023-06-24 DIAGNOSIS — E559 Vitamin D deficiency, unspecified: Secondary | ICD-10-CM

## 2023-06-24 DIAGNOSIS — E785 Hyperlipidemia, unspecified: Secondary | ICD-10-CM

## 2023-06-24 DIAGNOSIS — R6889 Other general symptoms and signs: Secondary | ICD-10-CM | POA: Diagnosis not present

## 2023-06-25 LAB — CBC WITH DIFFERENTIAL/PLATELET
Absolute Lymphocytes: 2626 {cells}/uL (ref 850–3900)
Absolute Monocytes: 758 {cells}/uL (ref 200–950)
Basophils Absolute: 91 {cells}/uL (ref 0–200)
Basophils Relative: 0.9 %
Eosinophils Absolute: 253 {cells}/uL (ref 15–500)
Eosinophils Relative: 2.5 %
HCT: 36.4 % (ref 35.0–45.0)
Hemoglobin: 12 g/dL (ref 11.7–15.5)
MCH: 32.3 pg (ref 27.0–33.0)
MCHC: 33 g/dL (ref 32.0–36.0)
MCV: 98.1 fL (ref 80.0–100.0)
MPV: 9.3 fL (ref 7.5–12.5)
Monocytes Relative: 7.5 %
Neutro Abs: 6373 {cells}/uL (ref 1500–7800)
Neutrophils Relative %: 63.1 %
Platelets: 355 10*3/uL (ref 140–400)
RBC: 3.71 10*6/uL — ABNORMAL LOW (ref 3.80–5.10)
RDW: 12.7 % (ref 11.0–15.0)
Total Lymphocyte: 26 %
WBC: 10.1 10*3/uL (ref 3.8–10.8)

## 2023-06-25 LAB — COMPREHENSIVE METABOLIC PANEL WITH GFR
AG Ratio: 1.6 (calc) (ref 1.0–2.5)
ALT: 13 U/L (ref 6–29)
AST: 12 U/L (ref 10–35)
Albumin: 4.2 g/dL (ref 3.6–5.1)
Alkaline phosphatase (APISO): 78 U/L (ref 37–153)
BUN: 22 mg/dL (ref 7–25)
CO2: 27 mmol/L (ref 20–32)
Calcium: 10.2 mg/dL (ref 8.6–10.4)
Chloride: 103 mmol/L (ref 98–110)
Creat: 0.87 mg/dL (ref 0.60–1.00)
Globulin: 2.7 g/dL (ref 1.9–3.7)
Glucose, Bld: 103 mg/dL — ABNORMAL HIGH (ref 65–99)
Potassium: 4.9 mmol/L (ref 3.5–5.3)
Sodium: 141 mmol/L (ref 135–146)
Total Bilirubin: 0.4 mg/dL (ref 0.2–1.2)
Total Protein: 6.9 g/dL (ref 6.1–8.1)
eGFR: 71 mL/min/{1.73_m2} (ref >=60)

## 2023-06-25 LAB — LIPID PANEL
Cholesterol: 148 mg/dL (ref <200)
HDL: 53 mg/dL (ref >=50)
LDL Cholesterol (Calc): 77 mg/dL
Non-HDL Cholesterol (Calc): 95 mg/dL (ref <130)
Total CHOL/HDL Ratio: 2.8 (calc) (ref <5.0)
Triglycerides: 97 mg/dL (ref <150)

## 2023-06-25 LAB — TSH: TSH: 0.54 m[IU]/L (ref 0.40–4.50)

## 2023-06-25 LAB — VITAMIN D 25 HYDROXY (VIT D DEFICIENCY, FRACTURES): Vit D, 25-Hydroxy: 41 ng/mL (ref 30–100)

## 2023-06-25 LAB — VITAMIN B12: Vitamin B-12: 1362 pg/mL — ABNORMAL HIGH (ref 200–1100)

## 2023-06-27 ENCOUNTER — Ambulatory Visit (INDEPENDENT_AMBULATORY_CARE_PROVIDER_SITE_OTHER): Admitting: Family Medicine

## 2023-06-27 ENCOUNTER — Ambulatory Visit (HOSPITAL_BASED_OUTPATIENT_CLINIC_OR_DEPARTMENT_OTHER)
Admission: RE | Admit: 2023-06-27 | Discharge: 2023-06-27 | Disposition: A | Discharge status: Home / Self Care | Source: Ambulatory Visit | Attending: Family Medicine | Admitting: Family Medicine

## 2023-06-27 ENCOUNTER — Ambulatory Visit: Payer: Self-pay

## 2023-06-27 VITALS — BP 120/77 | HR 69 | Ht 62.0 in | Wt 220.0 lb

## 2023-06-27 DIAGNOSIS — M1711 Unilateral primary osteoarthritis, right knee: Secondary | ICD-10-CM | POA: Diagnosis not present

## 2023-06-27 DIAGNOSIS — M25461 Effusion, right knee: Secondary | ICD-10-CM | POA: Diagnosis not present

## 2023-06-27 DIAGNOSIS — M25561 Pain in right knee: Secondary | ICD-10-CM | POA: Diagnosis not present

## 2023-06-27 MED ORDER — MELOXICAM 7.5 MG PO TABS
7.5000 mg | ORAL_TABLET | Freq: Every day | ORAL | 0 refills | Status: DC
Start: 2023-06-27 — End: 2023-07-24

## 2023-06-27 NOTE — Telephone Encounter (Signed)
 FYI Only or Action Required?: FYI only for provider.  Patient was last seen in primary care on 04/29/2023 by Jasmine Mclaughlin, [name], DO. Called Nurse Triage reporting Knee Pain. Symptoms began a week ago. Interventions attempted: OTC medications: tylenol . Symptoms are: gradually worsening.  Triage Disposition: See HCP Within 4 Hours (Or PCP Triage)  Patient/caregiver understands and will follow disposition?: Yes     Copied from CRM [id number]. Topic: Clinical - Red Word Triage >> Jun 27, 2023  8:06 AM [place] A wrote: [facility] that prompted transfer to Nurse Triage: Patient is having pain with right, pain has been occurring for about a week now she is having rouble walking Reason for Disposition  [1] SEVERE pain (e.g., excruciating, unable to walk) AND [2] not improved after 2 hours of pain medicine  Answer Assessment - Initial Assessment Questions 1. LOCATION and RADIATION: Where is the pain located?      Right knee 2. QUALITY: What does the pain feel like?  (e.g., sharp, dull, aching, burning)     Hx arthritis; dull ache to sharp on inside of knee 3. SEVERITY: How bad is the pain? What does it keep you from doing?   (Scale 1-10; or mild, moderate, severe)   -  MILD (1-3): doesn't interfere with normal activities    -  MODERATE (4-7): interferes with normal activities (e.g., work or school) or awakens from sleep, limping    -  SEVERE (8-10): excruciating pain, unable to do any normal activities, unable to walk     Moderate to severe 4. ONSET: When did the pain start? Does it come and go, or is it there all the time?     A week ago 5. RECURRENT: Have you had this pain before? If Yes, ask: When, and what happened then?     no 6. SETTING: Has there been any recent work, exercise or other activity that involved that part of the body?      denies 7. AGGRAVATING FACTORS: What makes the knee pain worse? (e.g., walking, climbing stairs, running)     Walking  8. ASSOCIATED  SYMPTOMS: Is there any swelling or redness of the knee?     No but warm 9. OTHER SYMPTOMS: Do you have any other symptoms? (e.g., chest pain, difficulty breathing, fever, calf pain)     no 10. PREGNANCY: Is there any chance you are pregnant? When was your last menstrual period?       na  Protocols used: Knee Pain-A-AH

## 2023-06-27 NOTE — Progress Notes (Signed)
 Acute Office Visit  Subjective:     Patient ID: Jasmine Mclaughlin, female    DOB: 1950-09-15, 73 y.o.   MRN: [id number]  Chief Complaint  Patient presents with   Knee Pain    HPI Patient is in today for right knee pain.   Discussed the use of AI scribe software for clinical note transcription with the patient, who gave verbal consent to proceed.  History of Present Illness Jasmine Mclaughlin is a 73 year old female with arthritis who presents with worsening right knee pain.  She has been experiencing worsening pain in her right knee for about a week. The pain is deep within the joint, particularly on the medial side and slightly posterior. There was no specific incident, such as a fall or misstep, that triggered the pain, and she has not noticed any popping or clicking sounds. The pain is severe enough that she has considered taking medication for it.  She has a history of arthritis and underwent arthroscopic surgery on the same knee approximately eleven years ago. Since then, she has not had significant issues with the knee until the recent onset of pain.  The knee feels swollen, and she has not applied ice or heat due to uncertainty about which would be beneficial. No bruising, rashes, or redness. The pain worsens with pressure, particularly on the medial side and slightly in the back, but the kneecap and lateral side are not painful.  She has not had an x-ray of the knee since before her previous surgery. She does not currently have an orthopedic doctor.            ROS All review of systems negative except what is listed in the HPI      Objective:    BP 120/77   Pulse 69   Ht 5' 2 (1.575 m)   Wt 220 lb (99.8 kg)   SpO2 96%   BMI 40.24 kg/m    Physical Exam Vitals reviewed.  Constitutional:      Appearance: Normal appearance.   Musculoskeletal:     Right knee: Swelling present. No bony tenderness or crepitus. Decreased range of motion. Tenderness present over the  medial joint line.   Skin:    General: Skin is warm and dry.   Neurological:     Mental Status: She is alert and oriented to person, place, and time.   Psychiatric:        Mood and Affect: Mood normal.        Behavior: Behavior normal.        Judgment: Judgment normal.        No results found for any visits on 06/27/23.      Assessment & Plan:   Problem List Items Addressed This Visit   None Visit Diagnoses       Acute pain of right knee    -  Primary   Relevant Medications   meloxicam  (MOBIC ) 7.5 MG tablet   Other Relevant Orders   DG Knee 1-2 Views Right   Ambulatory referral to Orthopedic Surgery       Assessment & Plan Right knee pain Worsening right knee pain with some swelling. Previous knee surgery noted. Discussed meloxicam  use and potential interaction with Celexa , advising monitoring for serotonin syndrome symptoms. - Order right knee x-ray  - Prescribe meloxicam  at lowest dose for pain management, monitor blood pressure, discontinue if increased. - Advise Voltaren gel if meloxicam  not tolerated. - Recommend ice or heat application  based on comfort. - Provide gentle knee exercises to maintain mobility, avoid painful activities. - Refer to [facility] for further evaluation, prefer [facility] or [facility] locations. - Instruct to avoid additional ibuprofen or Aleve  while on meloxicam .      Meds ordered this encounter  Medications   meloxicam  (MOBIC ) 7.5 MG tablet    Sig: Take 1 tablet (7.5 mg total) by mouth daily.    Dispense:  30 tablet    Refill:  0    Supervising Provider:   Randie Bustle A [[id number]]    Return if symptoms worsen or fail to improve.  Everlina Hock, NP

## 2023-06-28 ENCOUNTER — Ambulatory Visit: Payer: Self-pay | Admitting: Family Medicine

## 2023-07-02 DIAGNOSIS — M25561 Pain in right knee: Secondary | ICD-10-CM | POA: Diagnosis not present

## 2023-07-02 DIAGNOSIS — M1711 Unilateral primary osteoarthritis, right knee: Secondary | ICD-10-CM | POA: Diagnosis not present

## 2023-07-02 DIAGNOSIS — M25461 Effusion, right knee: Secondary | ICD-10-CM | POA: Diagnosis not present

## 2023-07-08 ENCOUNTER — Ambulatory Visit: Payer: Self-pay | Admitting: Family Medicine

## 2023-07-17 ENCOUNTER — Other Ambulatory Visit: Payer: Self-pay | Admitting: Family Medicine

## 2023-07-17 DIAGNOSIS — F418 Other specified anxiety disorders: Secondary | ICD-10-CM

## 2023-07-21 ENCOUNTER — Other Ambulatory Visit: Payer: Self-pay | Admitting: Neurology

## 2023-07-21 MED ORDER — TIZANIDINE HCL 4 MG PO TABS
4.0000 mg | ORAL_TABLET | Freq: Four times a day (QID) | ORAL | 2 refills | Status: AC | PRN
Start: 2023-07-21 — End: ?

## 2023-07-21 NOTE — Addendum Note (Signed)
 Addended by: ELOUISE POWELL HERO on: 07/21/2023 08:28 AM   Modules accepted: Orders

## 2023-07-23 ENCOUNTER — Other Ambulatory Visit: Payer: Self-pay | Admitting: Family Medicine

## 2023-07-23 DIAGNOSIS — M25561 Pain in right knee: Secondary | ICD-10-CM

## 2023-11-17 ENCOUNTER — Encounter: Payer: Medicare HMO | Admitting: Family Medicine

## 2024-02-12 ENCOUNTER — Ambulatory Visit: Payer: Medicare HMO | Admitting: Neurology

## 2024-05-11 ENCOUNTER — Ambulatory Visit: Admitting: Neurology
